# Patient Record
Sex: Female | Born: 1965 | Race: White | Hispanic: No | Smoking: Current every day smoker
Health system: Southern US, Community
[De-identification: ages and names within clinical notes are randomized; demographics above are authoritative.]

## PROBLEM LIST (undated history)

## (undated) DIAGNOSIS — R569 Unspecified convulsions: Secondary | ICD-10-CM

## (undated) DIAGNOSIS — R519 Headache, unspecified: Secondary | ICD-10-CM

## (undated) DIAGNOSIS — Z716 Tobacco abuse counseling: Secondary | ICD-10-CM

## (undated) DIAGNOSIS — R1319 Other dysphagia: Secondary | ICD-10-CM

## (undated) DIAGNOSIS — F4310 Post-traumatic stress disorder, unspecified: Secondary | ICD-10-CM

## (undated) DIAGNOSIS — M542 Cervicalgia: Secondary | ICD-10-CM

## (undated) DIAGNOSIS — F419 Anxiety disorder, unspecified: Secondary | ICD-10-CM

## (undated) DIAGNOSIS — M25511 Pain in right shoulder: Secondary | ICD-10-CM

## (undated) DIAGNOSIS — G44309 Post-traumatic headache, unspecified, not intractable: Secondary | ICD-10-CM

## (undated) DIAGNOSIS — F39 Unspecified mood [affective] disorder: Secondary | ICD-10-CM

## (undated) DIAGNOSIS — R29898 Other symptoms and signs involving the musculoskeletal system: Secondary | ICD-10-CM

## (undated) DIAGNOSIS — K219 Gastro-esophageal reflux disease without esophagitis: Secondary | ICD-10-CM

## (undated) DIAGNOSIS — G9389 Other specified disorders of brain: Secondary | ICD-10-CM

## (undated) DIAGNOSIS — M259 Joint disorder, unspecified: Secondary | ICD-10-CM

## (undated) DIAGNOSIS — G8929 Other chronic pain: Secondary | ICD-10-CM

## (undated) DIAGNOSIS — R42 Dizziness and giddiness: Secondary | ICD-10-CM

## (undated) DIAGNOSIS — N6320 Unspecified lump in the left breast, unspecified quadrant: Secondary | ICD-10-CM

## (undated) DIAGNOSIS — K5909 Other constipation: Secondary | ICD-10-CM

## (undated) DIAGNOSIS — G479 Sleep disorder, unspecified: Secondary | ICD-10-CM

## (undated) DIAGNOSIS — C801 Malignant (primary) neoplasm, unspecified: Secondary | ICD-10-CM

## (undated) DIAGNOSIS — J449 Chronic obstructive pulmonary disease, unspecified: Secondary | ICD-10-CM

## (undated) DIAGNOSIS — R109 Unspecified abdominal pain: Secondary | ICD-10-CM

## (undated) DIAGNOSIS — S069XAA Unspecified intracranial injury with loss of consciousness status unknown, initial encounter: Secondary | ICD-10-CM

## (undated) DIAGNOSIS — R748 Abnormal levels of other serum enzymes: Secondary | ICD-10-CM

## (undated) DIAGNOSIS — F329 Major depressive disorder, single episode, unspecified: Secondary | ICD-10-CM

## (undated) DIAGNOSIS — Z83719 Family history of colon polyps, unspecified: Secondary | ICD-10-CM

## (undated) DIAGNOSIS — K625 Hemorrhage of anus and rectum: Secondary | ICD-10-CM

## (undated) DIAGNOSIS — W19XXXA Unspecified fall, initial encounter: Secondary | ICD-10-CM

## (undated) DIAGNOSIS — I10 Essential (primary) hypertension: Secondary | ICD-10-CM

## (undated) DIAGNOSIS — S069X9A Unspecified intracranial injury with loss of consciousness of unspecified duration, initial encounter: Secondary | ICD-10-CM

## (undated) DIAGNOSIS — F32A Depression, unspecified: Secondary | ICD-10-CM

## (undated) DIAGNOSIS — R296 Repeated falls: Secondary | ICD-10-CM

## (undated) HISTORY — DX: Abnormal levels of other serum enzymes: R74.8

## (undated) HISTORY — PX: BACK SURGERY: SHX140

## (undated) HISTORY — DX: Other specified disorders of brain: G93.89

## (undated) HISTORY — DX: Pain in right shoulder: M25.511

## (undated) HISTORY — DX: Family history of colon polyps, unspecified: Z83.719

## (undated) HISTORY — DX: Tobacco abuse counseling: Z71.6

## (undated) HISTORY — DX: Essential (primary) hypertension: I10

## (undated) HISTORY — DX: Other dysphagia: R13.19

## (undated) HISTORY — DX: Anxiety disorder, unspecified: F41.9

## (undated) HISTORY — DX: Cervicalgia: M54.2

## (undated) HISTORY — DX: Headache, unspecified: R51.9

## (undated) HISTORY — DX: Unspecified mood (affective) disorder: F39

## (undated) HISTORY — PX: BREAST SURGERY: SHX581

## (undated) HISTORY — PX: OTHER SURGICAL HISTORY: SHX169

## (undated) HISTORY — DX: Chronic obstructive pulmonary disease, unspecified: J44.9

## (undated) HISTORY — DX: Other symptoms and signs involving the musculoskeletal system: R29.898

## (undated) HISTORY — PX: EYE SURGERY: SHX253

## (undated) HISTORY — DX: Unspecified abdominal pain: R10.9

## (undated) HISTORY — DX: Other chronic pain: G89.29

## (undated) HISTORY — PX: APPENDECTOMY: SHX54

## (undated) HISTORY — PX: COLON SURGERY: SHX602

## (undated) HISTORY — DX: Post-traumatic headache, unspecified, not intractable: G44.309

## (undated) HISTORY — DX: Other constipation: K59.09

## (undated) HISTORY — PX: LEEP: SHX91

## (undated) HISTORY — DX: Sleep disorder, unspecified: G47.9

---

## 2007-02-02 ENCOUNTER — Emergency Department: Payer: Self-pay | Admitting: Internal Medicine

## 2010-06-28 ENCOUNTER — Emergency Department: Payer: Self-pay | Admitting: Emergency Medicine

## 2011-04-09 DIAGNOSIS — G890 Central pain syndrome: Secondary | ICD-10-CM | POA: Diagnosis not present

## 2011-04-09 DIAGNOSIS — R109 Unspecified abdominal pain: Secondary | ICD-10-CM | POA: Diagnosis not present

## 2011-05-06 DIAGNOSIS — S0990XA Unspecified injury of head, initial encounter: Secondary | ICD-10-CM

## 2011-05-06 DIAGNOSIS — F329 Major depressive disorder, single episode, unspecified: Secondary | ICD-10-CM | POA: Diagnosis not present

## 2011-05-06 DIAGNOSIS — R5381 Other malaise: Secondary | ICD-10-CM | POA: Diagnosis not present

## 2011-05-06 DIAGNOSIS — E039 Hypothyroidism, unspecified: Secondary | ICD-10-CM | POA: Diagnosis not present

## 2011-05-06 HISTORY — DX: Unspecified injury of head, initial encounter: S09.90XA

## 2011-06-28 DIAGNOSIS — M549 Dorsalgia, unspecified: Secondary | ICD-10-CM | POA: Diagnosis not present

## 2011-06-28 DIAGNOSIS — Z79899 Other long term (current) drug therapy: Secondary | ICD-10-CM | POA: Diagnosis not present

## 2011-06-28 DIAGNOSIS — Z9889 Other specified postprocedural states: Secondary | ICD-10-CM | POA: Diagnosis not present

## 2011-06-28 DIAGNOSIS — G890 Central pain syndrome: Secondary | ICD-10-CM | POA: Diagnosis not present

## 2011-08-30 DIAGNOSIS — S8990XA Unspecified injury of unspecified lower leg, initial encounter: Secondary | ICD-10-CM | POA: Diagnosis not present

## 2011-08-30 DIAGNOSIS — S99929A Unspecified injury of unspecified foot, initial encounter: Secondary | ICD-10-CM | POA: Diagnosis not present

## 2011-08-30 DIAGNOSIS — F329 Major depressive disorder, single episode, unspecified: Secondary | ICD-10-CM | POA: Diagnosis not present

## 2011-08-30 DIAGNOSIS — S0990XA Unspecified injury of head, initial encounter: Secondary | ICD-10-CM | POA: Diagnosis not present

## 2011-09-27 DIAGNOSIS — S179XXA Crushing injury of neck, part unspecified, initial encounter: Secondary | ICD-10-CM | POA: Insufficient documentation

## 2011-09-27 DIAGNOSIS — S0990XA Unspecified injury of head, initial encounter: Secondary | ICD-10-CM | POA: Diagnosis not present

## 2011-09-27 HISTORY — DX: Crushing injury of neck, part unspecified, initial encounter: S17.9XXA

## 2011-11-22 DIAGNOSIS — IMO0002 Reserved for concepts with insufficient information to code with codable children: Secondary | ICD-10-CM | POA: Diagnosis not present

## 2011-12-09 ENCOUNTER — Encounter: Payer: Self-pay | Admitting: Family Medicine

## 2011-12-22 DIAGNOSIS — H43819 Vitreous degeneration, unspecified eye: Secondary | ICD-10-CM | POA: Diagnosis not present

## 2012-05-09 DIAGNOSIS — IMO0002 Reserved for concepts with insufficient information to code with codable children: Secondary | ICD-10-CM | POA: Diagnosis not present

## 2012-05-09 DIAGNOSIS — S336XXA Sprain of sacroiliac joint, initial encounter: Secondary | ICD-10-CM | POA: Diagnosis not present

## 2012-06-10 ENCOUNTER — Emergency Department: Payer: Self-pay | Admitting: Emergency Medicine

## 2012-06-10 DIAGNOSIS — Z79899 Other long term (current) drug therapy: Secondary | ICD-10-CM | POA: Diagnosis not present

## 2012-06-10 DIAGNOSIS — S93409A Sprain of unspecified ligament of unspecified ankle, initial encounter: Secondary | ICD-10-CM | POA: Diagnosis not present

## 2012-06-10 DIAGNOSIS — M25579 Pain in unspecified ankle and joints of unspecified foot: Secondary | ICD-10-CM | POA: Diagnosis not present

## 2012-06-10 DIAGNOSIS — F411 Generalized anxiety disorder: Secondary | ICD-10-CM | POA: Diagnosis not present

## 2012-06-10 DIAGNOSIS — F17200 Nicotine dependence, unspecified, uncomplicated: Secondary | ICD-10-CM | POA: Diagnosis not present

## 2012-06-10 DIAGNOSIS — S0990XA Unspecified injury of head, initial encounter: Secondary | ICD-10-CM | POA: Diagnosis not present

## 2012-07-04 DIAGNOSIS — G8929 Other chronic pain: Secondary | ICD-10-CM | POA: Diagnosis not present

## 2012-07-04 DIAGNOSIS — M549 Dorsalgia, unspecified: Secondary | ICD-10-CM | POA: Diagnosis not present

## 2012-07-04 DIAGNOSIS — S93409A Sprain of unspecified ligament of unspecified ankle, initial encounter: Secondary | ICD-10-CM | POA: Diagnosis not present

## 2012-08-03 DIAGNOSIS — Z79899 Other long term (current) drug therapy: Secondary | ICD-10-CM | POA: Diagnosis not present

## 2012-09-04 DIAGNOSIS — M5380 Other specified dorsopathies, site unspecified: Secondary | ICD-10-CM | POA: Diagnosis not present

## 2012-09-04 DIAGNOSIS — M549 Dorsalgia, unspecified: Secondary | ICD-10-CM | POA: Diagnosis not present

## 2012-09-04 DIAGNOSIS — G8929 Other chronic pain: Secondary | ICD-10-CM | POA: Diagnosis not present

## 2012-12-28 DIAGNOSIS — G8929 Other chronic pain: Secondary | ICD-10-CM | POA: Diagnosis not present

## 2012-12-28 DIAGNOSIS — Z23 Encounter for immunization: Secondary | ICD-10-CM | POA: Diagnosis not present

## 2012-12-28 DIAGNOSIS — M549 Dorsalgia, unspecified: Secondary | ICD-10-CM | POA: Diagnosis not present

## 2012-12-28 DIAGNOSIS — M5380 Other specified dorsopathies, site unspecified: Secondary | ICD-10-CM | POA: Diagnosis not present

## 2013-02-06 DIAGNOSIS — F411 Generalized anxiety disorder: Secondary | ICD-10-CM | POA: Diagnosis not present

## 2013-02-06 DIAGNOSIS — M549 Dorsalgia, unspecified: Secondary | ICD-10-CM | POA: Diagnosis not present

## 2013-02-06 DIAGNOSIS — M5380 Other specified dorsopathies, site unspecified: Secondary | ICD-10-CM | POA: Diagnosis not present

## 2013-02-06 DIAGNOSIS — G8929 Other chronic pain: Secondary | ICD-10-CM | POA: Diagnosis not present

## 2013-03-09 DIAGNOSIS — G8929 Other chronic pain: Secondary | ICD-10-CM | POA: Diagnosis not present

## 2013-03-09 DIAGNOSIS — M5380 Other specified dorsopathies, site unspecified: Secondary | ICD-10-CM | POA: Diagnosis not present

## 2013-03-09 DIAGNOSIS — M549 Dorsalgia, unspecified: Secondary | ICD-10-CM | POA: Diagnosis not present

## 2013-03-27 ENCOUNTER — Emergency Department: Payer: Self-pay | Admitting: Emergency Medicine

## 2013-03-27 DIAGNOSIS — F17200 Nicotine dependence, unspecified, uncomplicated: Secondary | ICD-10-CM | POA: Diagnosis not present

## 2013-03-27 DIAGNOSIS — F411 Generalized anxiety disorder: Secondary | ICD-10-CM | POA: Diagnosis not present

## 2013-03-27 DIAGNOSIS — G40909 Epilepsy, unspecified, not intractable, without status epilepticus: Secondary | ICD-10-CM | POA: Diagnosis not present

## 2013-03-27 DIAGNOSIS — R569 Unspecified convulsions: Secondary | ICD-10-CM | POA: Diagnosis not present

## 2013-03-27 LAB — URINALYSIS, COMPLETE
BACTERIA: NONE SEEN
Bilirubin,UR: NEGATIVE
Blood: NEGATIVE
Glucose,UR: NEGATIVE mg/dL (ref 0–75)
KETONE: NEGATIVE
LEUKOCYTE ESTERASE: NEGATIVE
Nitrite: NEGATIVE
Ph: 6 (ref 4.5–8.0)
Protein: 30
RBC,UR: 2 /HPF (ref 0–5)
Specific Gravity: 1.025 (ref 1.003–1.030)
WBC UR: 5 /HPF (ref 0–5)

## 2013-03-27 LAB — COMPREHENSIVE METABOLIC PANEL
ALK PHOS: 100 U/L
AST: 23 U/L (ref 15–37)
Albumin: 4.3 g/dL (ref 3.4–5.0)
Anion Gap: 8 (ref 7–16)
BILIRUBIN TOTAL: 0.4 mg/dL (ref 0.2–1.0)
BUN: 19 mg/dL — AB (ref 7–18)
CALCIUM: 9.7 mg/dL (ref 8.5–10.1)
CHLORIDE: 106 mmol/L (ref 98–107)
Co2: 26 mmol/L (ref 21–32)
Creatinine: 0.78 mg/dL (ref 0.60–1.30)
EGFR (African American): >60
GLUCOSE: 118 mg/dL — AB (ref 65–99)
OSMOLALITY: 283 (ref 275–301)
Potassium: 3.6 mmol/L (ref 3.5–5.1)
SGPT (ALT): 23 U/L (ref 12–78)
Sodium: 140 mmol/L (ref 136–145)
TOTAL PROTEIN: 7.9 g/dL (ref 6.4–8.2)

## 2013-03-27 LAB — DRUG SCREEN, URINE
Amphetamines, Ur Screen: NEGATIVE (ref ?–1000)
BENZODIAZEPINE, UR SCRN: POSITIVE (ref ?–200)
Barbiturates, Ur Screen: NEGATIVE (ref ?–200)
Cannabinoid 50 Ng, Ur ~~LOC~~: POSITIVE (ref ?–50)
Cocaine Metabolite,Ur ~~LOC~~: NEGATIVE (ref ?–300)
MDMA (Ecstasy)Ur Screen: NEGATIVE (ref ?–500)
Methadone, Ur Screen: NEGATIVE (ref ?–300)
Opiate, Ur Screen: POSITIVE (ref ?–300)
Phencyclidine (PCP) Ur S: NEGATIVE (ref ?–25)
Tricyclic, Ur Screen: NEGATIVE (ref ?–1000)

## 2013-03-27 LAB — CBC
HCT: 43.2 % (ref 35.0–47.0)
HGB: 14.8 g/dL (ref 12.0–16.0)
MCH: 31.9 pg (ref 26.0–34.0)
MCHC: 34.2 g/dL (ref 32.0–36.0)
MCV: 93 fL (ref 80–100)
Platelet: 279 10*3/uL (ref 150–440)
RBC: 4.63 10*6/uL (ref 3.80–5.20)
RDW: 13.4 % (ref 11.5–14.5)
WBC: 14 10*3/uL — AB (ref 3.6–11.0)

## 2013-04-03 DIAGNOSIS — Z5189 Encounter for other specified aftercare: Secondary | ICD-10-CM | POA: Diagnosis not present

## 2013-04-03 DIAGNOSIS — G40909 Epilepsy, unspecified, not intractable, without status epilepticus: Secondary | ICD-10-CM | POA: Diagnosis not present

## 2013-05-09 DIAGNOSIS — R569 Unspecified convulsions: Secondary | ICD-10-CM | POA: Diagnosis not present

## 2013-06-07 DIAGNOSIS — R569 Unspecified convulsions: Secondary | ICD-10-CM | POA: Diagnosis not present

## 2013-07-05 DIAGNOSIS — E782 Mixed hyperlipidemia: Secondary | ICD-10-CM | POA: Diagnosis not present

## 2013-07-05 DIAGNOSIS — Z79899 Other long term (current) drug therapy: Secondary | ICD-10-CM | POA: Diagnosis not present

## 2013-07-05 DIAGNOSIS — R51 Headache: Secondary | ICD-10-CM | POA: Diagnosis not present

## 2013-07-06 DIAGNOSIS — R569 Unspecified convulsions: Secondary | ICD-10-CM | POA: Diagnosis not present

## 2013-07-07 DIAGNOSIS — R569 Unspecified convulsions: Secondary | ICD-10-CM | POA: Diagnosis not present

## 2013-07-08 DIAGNOSIS — R569 Unspecified convulsions: Secondary | ICD-10-CM | POA: Diagnosis not present

## 2013-08-21 DIAGNOSIS — M25476 Effusion, unspecified foot: Secondary | ICD-10-CM | POA: Diagnosis not present

## 2013-08-21 DIAGNOSIS — M25473 Effusion, unspecified ankle: Secondary | ICD-10-CM | POA: Diagnosis not present

## 2013-08-21 DIAGNOSIS — T63001A Toxic effect of unspecified snake venom, accidental (unintentional), initial encounter: Secondary | ICD-10-CM | POA: Diagnosis not present

## 2013-08-21 DIAGNOSIS — M25579 Pain in unspecified ankle and joints of unspecified foot: Secondary | ICD-10-CM | POA: Diagnosis not present

## 2013-08-22 ENCOUNTER — Observation Stay: Payer: Self-pay | Admitting: Student

## 2013-08-22 DIAGNOSIS — G40909 Epilepsy, unspecified, not intractable, without status epilepticus: Secondary | ICD-10-CM | POA: Diagnosis not present

## 2013-08-22 DIAGNOSIS — F411 Generalized anxiety disorder: Secondary | ICD-10-CM | POA: Diagnosis not present

## 2013-08-22 DIAGNOSIS — R29898 Other symptoms and signs involving the musculoskeletal system: Secondary | ICD-10-CM | POA: Diagnosis not present

## 2013-08-22 DIAGNOSIS — Z87820 Personal history of traumatic brain injury: Secondary | ICD-10-CM | POA: Diagnosis not present

## 2013-08-22 DIAGNOSIS — S069X9A Unspecified intracranial injury with loss of consciousness of unspecified duration, initial encounter: Secondary | ICD-10-CM | POA: Diagnosis not present

## 2013-08-22 DIAGNOSIS — Z9109 Other allergy status, other than to drugs and biological substances: Secondary | ICD-10-CM | POA: Diagnosis not present

## 2013-08-22 DIAGNOSIS — Z79899 Other long term (current) drug therapy: Secondary | ICD-10-CM | POA: Diagnosis not present

## 2013-08-22 DIAGNOSIS — Z91041 Radiographic dye allergy status: Secondary | ICD-10-CM | POA: Diagnosis not present

## 2013-08-22 DIAGNOSIS — Z833 Family history of diabetes mellitus: Secondary | ICD-10-CM | POA: Diagnosis not present

## 2013-08-22 DIAGNOSIS — S069XAA Unspecified intracranial injury with loss of consciousness status unknown, initial encounter: Secondary | ICD-10-CM | POA: Diagnosis not present

## 2013-08-22 DIAGNOSIS — Z8249 Family history of ischemic heart disease and other diseases of the circulatory system: Secondary | ICD-10-CM | POA: Diagnosis not present

## 2013-08-22 DIAGNOSIS — T6391XA Toxic effect of contact with unspecified venomous animal, accidental (unintentional), initial encounter: Secondary | ICD-10-CM

## 2013-08-22 LAB — CREATININE, SERUM
Creatinine: 0.6 mg/dL (ref 0.60–1.30)
EGFR (Non-African Amer.): >60

## 2013-08-22 LAB — BASIC METABOLIC PANEL
ANION GAP: 7 (ref 7–16)
BUN: 15 mg/dL (ref 7–18)
CALCIUM: 8.8 mg/dL (ref 8.5–10.1)
CREATININE: 0.59 mg/dL — AB (ref 0.60–1.30)
Chloride: 103 mmol/L (ref 98–107)
Co2: 29 mmol/L (ref 21–32)
EGFR (African American): >60
EGFR (Non-African Amer.): >60
Glucose: 117 mg/dL — ABNORMAL HIGH (ref 65–99)
Osmolality: 279 (ref 275–301)
POTASSIUM: 4.1 mmol/L (ref 3.5–5.1)
Sodium: 139 mmol/L (ref 136–145)

## 2013-08-22 LAB — CBC WITH DIFFERENTIAL/PLATELET
BASOS PCT: 0.3 %
BASOS PCT: 0.7 %
Basophil #: 0 10*3/uL (ref 0.0–0.1)
Basophil #: 0.1 10*3/uL (ref 0.0–0.1)
Basophil #: 0.1 10*3/uL (ref 0.0–0.1)
Basophil %: 0.3 %
EOS ABS: 0.3 10*3/uL (ref 0.0–0.7)
EOS ABS: 0.6 10*3/uL (ref 0.0–0.7)
EOS PCT: 4.2 %
Eosinophil #: 0.1 10*3/uL (ref 0.0–0.7)
Eosinophil %: 0.4 %
Eosinophil %: 2 %
HCT: 40 % (ref 35.0–47.0)
HCT: 40.9 % (ref 35.0–47.0)
HCT: 42.2 % (ref 35.0–47.0)
HGB: 13.4 g/dL (ref 12.0–16.0)
HGB: 13.6 g/dL (ref 12.0–16.0)
HGB: 13.9 g/dL (ref 12.0–16.0)
LYMPHS ABS: 3.2 10*3/uL (ref 1.0–3.6)
Lymphocyte #: 2.6 10*3/uL (ref 1.0–3.6)
Lymphocyte #: 6.3 10*3/uL — ABNORMAL HIGH (ref 1.0–3.6)
Lymphocyte %: 15.8 %
Lymphocyte %: 21.6 %
Lymphocyte %: 42.9 %
MCH: 30.9 pg (ref 26.0–34.0)
MCH: 31.2 pg (ref 26.0–34.0)
MCH: 31.2 pg (ref 26.0–34.0)
MCHC: 33 g/dL (ref 32.0–36.0)
MCHC: 33.2 g/dL (ref 32.0–36.0)
MCHC: 33.5 g/dL (ref 32.0–36.0)
MCV: 93 fL (ref 80–100)
MCV: 94 fL (ref 80–100)
MCV: 94 fL (ref 80–100)
MONO ABS: 1 x10 3/mm — AB (ref 0.2–0.9)
MONOS PCT: 4.8 %
MONOS PCT: 6.2 %
MONOS PCT: 6.8 %
Monocyte #: 0.8 x10 3/mm (ref 0.2–0.9)
Monocyte #: 0.9 x10 3/mm (ref 0.2–0.9)
NEUTROS PCT: 78.7 %
Neutrophil #: 10.5 10*3/uL — ABNORMAL HIGH (ref 1.4–6.5)
Neutrophil #: 13.2 10*3/uL — ABNORMAL HIGH (ref 1.4–6.5)
Neutrophil #: 6.7 10*3/uL — ABNORMAL HIGH (ref 1.4–6.5)
Neutrophil %: 45.4 %
Neutrophil %: 69.9 %
PLATELETS: 285 10*3/uL (ref 150–440)
Platelet: 277 10*3/uL (ref 150–440)
Platelet: 323 10*3/uL (ref 150–440)
RBC: 4.3 10*6/uL (ref 3.80–5.20)
RBC: 4.35 10*6/uL (ref 3.80–5.20)
RBC: 4.5 10*6/uL (ref 3.80–5.20)
RDW: 13.2 % (ref 11.5–14.5)
RDW: 13.4 % (ref 11.5–14.5)
RDW: 13.4 % (ref 11.5–14.5)
WBC: 14.8 10*3/uL — AB (ref 3.6–11.0)
WBC: 15 10*3/uL — AB (ref 3.6–11.0)
WBC: 16.7 10*3/uL — ABNORMAL HIGH (ref 3.6–11.0)

## 2013-08-22 LAB — PROTIME-INR
INR: 1
INR: 1
INR: 1.1
PROTHROMBIN TIME: 13.3 s (ref 11.5–14.7)
PROTHROMBIN TIME: 13.8 s (ref 11.5–14.7)
Prothrombin Time: 12.8 secs (ref 11.5–14.7)

## 2013-08-22 LAB — FIBRINOGEN
Fibrinogen: 322 mg/dL (ref 210–470)
Fibrinogen: 341 mg/dL (ref 210–470)

## 2013-08-22 LAB — D-DIMER(ARMC): D-Dimer: 331 ng/ml

## 2013-08-23 DIAGNOSIS — T6391XA Toxic effect of contact with unspecified venomous animal, accidental (unintentional), initial encounter: Secondary | ICD-10-CM | POA: Diagnosis not present

## 2013-08-23 DIAGNOSIS — F411 Generalized anxiety disorder: Secondary | ICD-10-CM | POA: Diagnosis not present

## 2013-08-23 DIAGNOSIS — S069XAA Unspecified intracranial injury with loss of consciousness status unknown, initial encounter: Secondary | ICD-10-CM | POA: Diagnosis not present

## 2013-08-23 DIAGNOSIS — G40909 Epilepsy, unspecified, not intractable, without status epilepticus: Secondary | ICD-10-CM | POA: Diagnosis not present

## 2013-08-23 DIAGNOSIS — S069X9A Unspecified intracranial injury with loss of consciousness of unspecified duration, initial encounter: Secondary | ICD-10-CM | POA: Diagnosis not present

## 2013-08-23 LAB — PROTIME-INR
INR: 1.1
PROTHROMBIN TIME: 13.9 s (ref 11.5–14.7)

## 2013-08-26 ENCOUNTER — Emergency Department: Payer: Self-pay | Admitting: Emergency Medicine

## 2013-08-26 DIAGNOSIS — M25473 Effusion, unspecified ankle: Secondary | ICD-10-CM | POA: Diagnosis not present

## 2013-08-26 DIAGNOSIS — M79609 Pain in unspecified limb: Secondary | ICD-10-CM | POA: Diagnosis not present

## 2013-08-26 DIAGNOSIS — F17200 Nicotine dependence, unspecified, uncomplicated: Secondary | ICD-10-CM | POA: Diagnosis not present

## 2013-08-26 DIAGNOSIS — M25476 Effusion, unspecified foot: Secondary | ICD-10-CM | POA: Diagnosis not present

## 2013-08-26 DIAGNOSIS — T6391XA Toxic effect of contact with unspecified venomous animal, accidental (unintentional), initial encounter: Secondary | ICD-10-CM | POA: Diagnosis not present

## 2013-08-26 DIAGNOSIS — Z79899 Other long term (current) drug therapy: Secondary | ICD-10-CM | POA: Diagnosis not present

## 2013-08-26 DIAGNOSIS — Z760 Encounter for issue of repeat prescription: Secondary | ICD-10-CM | POA: Diagnosis not present

## 2013-08-26 DIAGNOSIS — R569 Unspecified convulsions: Secondary | ICD-10-CM | POA: Diagnosis not present

## 2013-08-26 LAB — PROTIME-INR
INR: 1
Prothrombin Time: 13.2 secs (ref 11.5–14.7)

## 2013-08-26 LAB — CBC WITH DIFFERENTIAL/PLATELET
Basophil #: 0.1 10*3/uL (ref 0.0–0.1)
Basophil %: 0.6 %
EOS PCT: 3.4 %
Eosinophil #: 0.4 10*3/uL (ref 0.0–0.7)
HCT: 41.6 % (ref 35.0–47.0)
HGB: 13.9 g/dL (ref 12.0–16.0)
LYMPHS PCT: 33.1 %
Lymphocyte #: 4.1 10*3/uL — ABNORMAL HIGH (ref 1.0–3.6)
MCH: 31.2 pg (ref 26.0–34.0)
MCHC: 33.5 g/dL (ref 32.0–36.0)
MCV: 93 fL (ref 80–100)
Monocyte #: 0.7 x10 3/mm (ref 0.2–0.9)
Monocyte %: 5.6 %
NEUTROS ABS: 7.1 10*3/uL — AB (ref 1.4–6.5)
NEUTROS PCT: 57.3 %
Platelet: 320 10*3/uL (ref 150–440)
RBC: 4.47 10*6/uL (ref 3.80–5.20)
RDW: 13.3 % (ref 11.5–14.5)
WBC: 12.4 10*3/uL — ABNORMAL HIGH (ref 3.6–11.0)

## 2013-08-26 LAB — COMPREHENSIVE METABOLIC PANEL
ALBUMIN: 4.3 g/dL (ref 3.4–5.0)
ALK PHOS: 97 U/L
AST: 17 U/L (ref 15–37)
Anion Gap: 6 — ABNORMAL LOW (ref 7–16)
BUN: 15 mg/dL (ref 7–18)
Bilirubin,Total: 0.3 mg/dL (ref 0.2–1.0)
CO2: 31 mmol/L (ref 21–32)
CREATININE: 0.6 mg/dL (ref 0.60–1.30)
Calcium, Total: 9.8 mg/dL (ref 8.5–10.1)
Chloride: 102 mmol/L (ref 98–107)
EGFR (Non-African Amer.): >60
Glucose: 92 mg/dL (ref 65–99)
OSMOLALITY: 278 (ref 275–301)
Potassium: 4.1 mmol/L (ref 3.5–5.1)
SGPT (ALT): 21 U/L (ref 12–78)
Sodium: 139 mmol/L (ref 136–145)
Total Protein: 7.6 g/dL (ref 6.4–8.2)

## 2013-08-26 LAB — FIBRINOGEN: FIBRINOGEN: 320 mg/dL (ref 210–470)

## 2013-08-26 LAB — D-DIMER(ARMC): D-DIMER: 923 ng/mL

## 2013-09-14 DIAGNOSIS — D649 Anemia, unspecified: Secondary | ICD-10-CM | POA: Diagnosis not present

## 2013-09-21 DIAGNOSIS — G894 Chronic pain syndrome: Secondary | ICD-10-CM | POA: Diagnosis not present

## 2013-09-21 DIAGNOSIS — E782 Mixed hyperlipidemia: Secondary | ICD-10-CM | POA: Diagnosis not present

## 2013-09-21 DIAGNOSIS — S069X9S Unspecified intracranial injury with loss of consciousness of unspecified duration, sequela: Secondary | ICD-10-CM | POA: Diagnosis not present

## 2013-09-21 DIAGNOSIS — F17200 Nicotine dependence, unspecified, uncomplicated: Secondary | ICD-10-CM | POA: Diagnosis not present

## 2013-09-21 DIAGNOSIS — G40909 Epilepsy, unspecified, not intractable, without status epilepticus: Secondary | ICD-10-CM | POA: Diagnosis not present

## 2013-09-21 DIAGNOSIS — S069XAS Unspecified intracranial injury with loss of consciousness status unknown, sequela: Secondary | ICD-10-CM | POA: Diagnosis not present

## 2013-09-21 DIAGNOSIS — F329 Major depressive disorder, single episode, unspecified: Secondary | ICD-10-CM | POA: Diagnosis not present

## 2013-09-21 DIAGNOSIS — F3289 Other specified depressive episodes: Secondary | ICD-10-CM | POA: Diagnosis not present

## 2013-09-21 DIAGNOSIS — M5137 Other intervertebral disc degeneration, lumbosacral region: Secondary | ICD-10-CM | POA: Diagnosis not present

## 2013-09-21 DIAGNOSIS — F411 Generalized anxiety disorder: Secondary | ICD-10-CM | POA: Diagnosis not present

## 2013-10-01 DIAGNOSIS — R569 Unspecified convulsions: Secondary | ICD-10-CM | POA: Diagnosis not present

## 2013-10-18 DIAGNOSIS — Z23 Encounter for immunization: Secondary | ICD-10-CM | POA: Diagnosis not present

## 2013-10-18 DIAGNOSIS — F3289 Other specified depressive episodes: Secondary | ICD-10-CM | POA: Diagnosis not present

## 2013-10-18 DIAGNOSIS — E782 Mixed hyperlipidemia: Secondary | ICD-10-CM | POA: Diagnosis not present

## 2013-10-18 DIAGNOSIS — F329 Major depressive disorder, single episode, unspecified: Secondary | ICD-10-CM | POA: Diagnosis not present

## 2013-10-18 DIAGNOSIS — G40909 Epilepsy, unspecified, not intractable, without status epilepticus: Secondary | ICD-10-CM | POA: Diagnosis not present

## 2013-11-21 ENCOUNTER — Ambulatory Visit: Payer: Self-pay | Admitting: Physician Assistant

## 2013-11-21 DIAGNOSIS — F17200 Nicotine dependence, unspecified, uncomplicated: Secondary | ICD-10-CM | POA: Diagnosis not present

## 2013-11-21 DIAGNOSIS — E782 Mixed hyperlipidemia: Secondary | ICD-10-CM | POA: Diagnosis not present

## 2013-11-21 DIAGNOSIS — M4802 Spinal stenosis, cervical region: Secondary | ICD-10-CM | POA: Diagnosis not present

## 2013-11-21 DIAGNOSIS — R300 Dysuria: Secondary | ICD-10-CM | POA: Diagnosis not present

## 2013-11-21 DIAGNOSIS — G894 Chronic pain syndrome: Secondary | ICD-10-CM | POA: Diagnosis not present

## 2013-11-21 DIAGNOSIS — M542 Cervicalgia: Secondary | ICD-10-CM | POA: Diagnosis not present

## 2013-11-21 DIAGNOSIS — Z0000 Encounter for general adult medical examination without abnormal findings: Secondary | ICD-10-CM | POA: Diagnosis not present

## 2013-11-21 DIAGNOSIS — M25519 Pain in unspecified shoulder: Secondary | ICD-10-CM | POA: Diagnosis not present

## 2013-11-21 DIAGNOSIS — M5106 Intervertebral disc disorders with myelopathy, lumbar region: Secondary | ICD-10-CM | POA: Diagnosis not present

## 2013-11-21 DIAGNOSIS — M5030 Other cervical disc degeneration, unspecified cervical region: Secondary | ICD-10-CM | POA: Diagnosis not present

## 2013-11-21 DIAGNOSIS — G40909 Epilepsy, unspecified, not intractable, without status epilepticus: Secondary | ICD-10-CM | POA: Diagnosis not present

## 2013-11-21 DIAGNOSIS — F411 Generalized anxiety disorder: Secondary | ICD-10-CM | POA: Diagnosis not present

## 2013-11-21 DIAGNOSIS — F339 Major depressive disorder, recurrent, unspecified: Secondary | ICD-10-CM | POA: Diagnosis not present

## 2013-12-11 ENCOUNTER — Inpatient Hospital Stay: Payer: Self-pay | Admitting: Internal Medicine

## 2013-12-11 DIAGNOSIS — G40909 Epilepsy, unspecified, not intractable, without status epilepticus: Secondary | ICD-10-CM | POA: Diagnosis not present

## 2013-12-11 DIAGNOSIS — Z720 Tobacco use: Secondary | ICD-10-CM | POA: Diagnosis not present

## 2013-12-11 DIAGNOSIS — G894 Chronic pain syndrome: Secondary | ICD-10-CM | POA: Diagnosis not present

## 2013-12-11 DIAGNOSIS — R569 Unspecified convulsions: Secondary | ICD-10-CM | POA: Diagnosis not present

## 2013-12-11 LAB — DRUG SCREEN, URINE
AMPHETAMINES, UR SCREEN: NEGATIVE (ref ?–1000)
BARBITURATES, UR SCREEN: NEGATIVE (ref ?–200)
Benzodiazepine, Ur Scrn: NEGATIVE (ref ?–200)
Cannabinoid 50 Ng, Ur ~~LOC~~: POSITIVE (ref ?–50)
Cocaine Metabolite,Ur ~~LOC~~: NEGATIVE (ref ?–300)
MDMA (ECSTASY) UR SCREEN: NEGATIVE (ref ?–500)
Methadone, Ur Screen: NEGATIVE (ref ?–300)
Opiate, Ur Screen: NEGATIVE (ref ?–300)
Phencyclidine (PCP) Ur S: NEGATIVE (ref ?–25)
Tricyclic, Ur Screen: NEGATIVE (ref ?–1000)

## 2013-12-11 LAB — URINALYSIS, COMPLETE
BLOOD: NEGATIVE
Bacteria: NONE SEEN
Bilirubin,UR: NEGATIVE
GLUCOSE, UR: NEGATIVE mg/dL (ref 0–75)
KETONE: NEGATIVE
Leukocyte Esterase: NEGATIVE
Nitrite: NEGATIVE
PROTEIN: NEGATIVE
Ph: 7 (ref 4.5–8.0)
RBC,UR: 1 /HPF (ref 0–5)
Specific Gravity: 1.013 (ref 1.003–1.030)
Squamous Epithelial: NONE SEEN
WBC UR: NONE SEEN /HPF (ref 0–5)

## 2013-12-11 LAB — CBC
HCT: 42.5 % (ref 35.0–47.0)
HGB: 13.8 g/dL (ref 12.0–16.0)
MCH: 30.2 pg (ref 26.0–34.0)
MCHC: 32.4 g/dL (ref 32.0–36.0)
MCV: 93 fL (ref 80–100)
Platelet: 280 10*3/uL (ref 150–440)
RBC: 4.57 10*6/uL (ref 3.80–5.20)
RDW: 12.7 % (ref 11.5–14.5)
WBC: 10.9 10*3/uL (ref 3.6–11.0)

## 2013-12-11 LAB — COMPREHENSIVE METABOLIC PANEL
Albumin: 3.8 g/dL (ref 3.4–5.0)
Alkaline Phosphatase: 97 U/L
Anion Gap: 7 (ref 7–16)
BUN: 13 mg/dL (ref 7–18)
Bilirubin,Total: 0.3 mg/dL (ref 0.2–1.0)
CHLORIDE: 106 mmol/L (ref 98–107)
CREATININE: 0.71 mg/dL (ref 0.60–1.30)
Calcium, Total: 8.8 mg/dL (ref 8.5–10.1)
Co2: 30 mmol/L (ref 21–32)
EGFR (African American): >60
GLUCOSE: 110 mg/dL — AB (ref 65–99)
OSMOLALITY: 286 (ref 275–301)
POTASSIUM: 4 mmol/L (ref 3.5–5.1)
SGOT(AST): 24 U/L (ref 15–37)
SGPT (ALT): 25 U/L
Sodium: 143 mmol/L (ref 136–145)
TOTAL PROTEIN: 7.1 g/dL (ref 6.4–8.2)

## 2013-12-11 LAB — ETHANOL: Ethanol: <3 mg/dL

## 2013-12-12 ENCOUNTER — Ambulatory Visit: Payer: Self-pay | Admitting: Neurology

## 2013-12-12 DIAGNOSIS — Z720 Tobacco use: Secondary | ICD-10-CM | POA: Diagnosis not present

## 2013-12-12 DIAGNOSIS — R569 Unspecified convulsions: Secondary | ICD-10-CM | POA: Diagnosis not present

## 2013-12-12 DIAGNOSIS — F419 Anxiety disorder, unspecified: Secondary | ICD-10-CM | POA: Diagnosis not present

## 2013-12-12 DIAGNOSIS — G894 Chronic pain syndrome: Secondary | ICD-10-CM | POA: Diagnosis not present

## 2013-12-12 LAB — PHENYTOIN LEVEL, TOTAL: Dilantin: 16.3 ug/mL (ref 10.0–20.0)

## 2013-12-13 DIAGNOSIS — H43813 Vitreous degeneration, bilateral: Secondary | ICD-10-CM | POA: Diagnosis not present

## 2013-12-13 DIAGNOSIS — R569 Unspecified convulsions: Secondary | ICD-10-CM | POA: Diagnosis not present

## 2013-12-17 ENCOUNTER — Inpatient Hospital Stay: Payer: Self-pay | Admitting: Psychiatry

## 2013-12-17 DIAGNOSIS — R45851 Suicidal ideations: Secondary | ICD-10-CM | POA: Diagnosis not present

## 2013-12-17 DIAGNOSIS — F332 Major depressive disorder, recurrent severe without psychotic features: Secondary | ICD-10-CM | POA: Diagnosis not present

## 2013-12-17 LAB — CBC
HCT: 42.6 % (ref 35.0–47.0)
HGB: 14.1 g/dL (ref 12.0–16.0)
MCH: 30.7 pg (ref 26.0–34.0)
MCHC: 33.2 g/dL (ref 32.0–36.0)
MCV: 93 fL (ref 80–100)
Platelet: 283 10*3/uL (ref 150–440)
RBC: 4.6 10*6/uL (ref 3.80–5.20)
RDW: 12.8 % (ref 11.5–14.5)
WBC: 11.2 10*3/uL — ABNORMAL HIGH (ref 3.6–11.0)

## 2013-12-17 LAB — COMPREHENSIVE METABOLIC PANEL
ALBUMIN: 4.1 g/dL (ref 3.4–5.0)
ALK PHOS: 99 U/L
Anion Gap: 7 (ref 7–16)
BUN: 19 mg/dL — AB (ref 7–18)
Bilirubin,Total: 0.2 mg/dL (ref 0.2–1.0)
CALCIUM: 9.3 mg/dL (ref 8.5–10.1)
CHLORIDE: 100 mmol/L (ref 98–107)
Co2: 31 mmol/L (ref 21–32)
Creatinine: 0.84 mg/dL (ref 0.60–1.30)
EGFR (African American): >60
EGFR (Non-African Amer.): >60
Glucose: 104 mg/dL — ABNORMAL HIGH (ref 65–99)
OSMOLALITY: 278 (ref 275–301)
Potassium: 4.3 mmol/L (ref 3.5–5.1)
SGOT(AST): 17 U/L (ref 15–37)
SGPT (ALT): 25 U/L
Sodium: 138 mmol/L (ref 136–145)
Total Protein: 7.4 g/dL (ref 6.4–8.2)

## 2013-12-17 LAB — URINALYSIS, COMPLETE
Bacteria: NONE SEEN
Bilirubin,UR: NEGATIVE
Blood: NEGATIVE
Glucose,UR: NEGATIVE mg/dL (ref 0–75)
NITRITE: NEGATIVE
PH: 5 (ref 4.5–8.0)
Protein: NEGATIVE
RBC,UR: 4 /HPF (ref 0–5)
SPECIFIC GRAVITY: 1.026 (ref 1.003–1.030)

## 2013-12-17 LAB — DRUG SCREEN, URINE
Amphetamines, Ur Screen: NEGATIVE (ref ?–1000)
BENZODIAZEPINE, UR SCRN: NEGATIVE (ref ?–200)
Barbiturates, Ur Screen: NEGATIVE (ref ?–200)
CANNABINOID 50 NG, UR ~~LOC~~: POSITIVE (ref ?–50)
Cocaine Metabolite,Ur ~~LOC~~: NEGATIVE (ref ?–300)
MDMA (ECSTASY) UR SCREEN: NEGATIVE (ref ?–500)
Methadone, Ur Screen: NEGATIVE (ref ?–300)
Opiate, Ur Screen: NEGATIVE (ref ?–300)
PHENCYCLIDINE (PCP) UR S: NEGATIVE (ref ?–25)
TRICYCLIC, UR SCREEN: NEGATIVE (ref ?–1000)

## 2013-12-17 LAB — ACETAMINOPHEN LEVEL: Acetaminophen: <2 ug/mL

## 2013-12-17 LAB — SALICYLATE LEVEL: Salicylates, Serum: 4 mg/dL — ABNORMAL HIGH

## 2013-12-17 LAB — ETHANOL: Ethanol: <3 mg/dL (ref 0–80)

## 2013-12-18 LAB — URINE CULTURE

## 2013-12-24 DIAGNOSIS — G40919 Epilepsy, unspecified, intractable, without status epilepticus: Secondary | ICD-10-CM | POA: Diagnosis not present

## 2013-12-24 DIAGNOSIS — G894 Chronic pain syndrome: Secondary | ICD-10-CM | POA: Diagnosis not present

## 2013-12-24 DIAGNOSIS — M5013 Cervical disc disorder with radiculopathy, cervicothoracic region: Secondary | ICD-10-CM | POA: Diagnosis not present

## 2013-12-24 DIAGNOSIS — F17210 Nicotine dependence, cigarettes, uncomplicated: Secondary | ICD-10-CM | POA: Diagnosis not present

## 2013-12-24 DIAGNOSIS — F410 Panic disorder [episodic paroxysmal anxiety] without agoraphobia: Secondary | ICD-10-CM | POA: Diagnosis not present

## 2013-12-24 DIAGNOSIS — F339 Major depressive disorder, recurrent, unspecified: Secondary | ICD-10-CM | POA: Diagnosis not present

## 2013-12-24 DIAGNOSIS — M5106 Intervertebral disc disorders with myelopathy, lumbar region: Secondary | ICD-10-CM | POA: Diagnosis not present

## 2013-12-24 DIAGNOSIS — N390 Urinary tract infection, site not specified: Secondary | ICD-10-CM | POA: Diagnosis not present

## 2013-12-25 DIAGNOSIS — F4310 Post-traumatic stress disorder, unspecified: Secondary | ICD-10-CM | POA: Diagnosis not present

## 2013-12-30 ENCOUNTER — Emergency Department: Payer: Self-pay | Admitting: Emergency Medicine

## 2013-12-30 DIAGNOSIS — K146 Glossodynia: Secondary | ICD-10-CM | POA: Diagnosis not present

## 2013-12-30 DIAGNOSIS — Z720 Tobacco use: Secondary | ICD-10-CM | POA: Diagnosis not present

## 2013-12-30 DIAGNOSIS — K148 Other diseases of tongue: Secondary | ICD-10-CM | POA: Diagnosis not present

## 2013-12-30 DIAGNOSIS — R221 Localized swelling, mass and lump, neck: Secondary | ICD-10-CM | POA: Diagnosis not present

## 2013-12-30 DIAGNOSIS — J029 Acute pharyngitis, unspecified: Secondary | ICD-10-CM | POA: Diagnosis not present

## 2014-01-09 ENCOUNTER — Ambulatory Visit: Payer: Self-pay | Admitting: Pain Medicine

## 2014-01-09 DIAGNOSIS — M47817 Spondylosis without myelopathy or radiculopathy, lumbosacral region: Secondary | ICD-10-CM | POA: Diagnosis not present

## 2014-01-09 DIAGNOSIS — M5416 Radiculopathy, lumbar region: Secondary | ICD-10-CM | POA: Diagnosis not present

## 2014-01-09 DIAGNOSIS — M542 Cervicalgia: Secondary | ICD-10-CM | POA: Diagnosis not present

## 2014-01-09 DIAGNOSIS — M25552 Pain in left hip: Secondary | ICD-10-CM | POA: Diagnosis not present

## 2014-01-09 DIAGNOSIS — M545 Low back pain: Secondary | ICD-10-CM | POA: Diagnosis not present

## 2014-01-09 DIAGNOSIS — M47812 Spondylosis without myelopathy or radiculopathy, cervical region: Secondary | ICD-10-CM | POA: Diagnosis not present

## 2014-01-09 DIAGNOSIS — M5412 Radiculopathy, cervical region: Secondary | ICD-10-CM | POA: Diagnosis not present

## 2014-01-15 DIAGNOSIS — G629 Polyneuropathy, unspecified: Secondary | ICD-10-CM | POA: Diagnosis not present

## 2014-01-15 DIAGNOSIS — M629 Disorder of muscle, unspecified: Secondary | ICD-10-CM | POA: Diagnosis not present

## 2014-01-15 DIAGNOSIS — M6281 Muscle weakness (generalized): Secondary | ICD-10-CM | POA: Diagnosis not present

## 2014-01-21 DIAGNOSIS — F4542 Pain disorder with related psychological factors: Secondary | ICD-10-CM | POA: Diagnosis not present

## 2014-02-11 DIAGNOSIS — F332 Major depressive disorder, recurrent severe without psychotic features: Secondary | ICD-10-CM | POA: Diagnosis not present

## 2014-02-11 DIAGNOSIS — Z79891 Long term (current) use of opiate analgesic: Secondary | ICD-10-CM | POA: Diagnosis not present

## 2014-03-18 DIAGNOSIS — M542 Cervicalgia: Secondary | ICD-10-CM | POA: Diagnosis not present

## 2014-03-18 DIAGNOSIS — Z79899 Other long term (current) drug therapy: Secondary | ICD-10-CM | POA: Diagnosis not present

## 2014-03-25 DIAGNOSIS — N390 Urinary tract infection, site not specified: Secondary | ICD-10-CM | POA: Diagnosis not present

## 2014-04-11 DIAGNOSIS — R338 Other retention of urine: Secondary | ICD-10-CM | POA: Diagnosis not present

## 2014-04-20 ENCOUNTER — Ambulatory Visit: Payer: Self-pay | Admitting: Pain Medicine

## 2014-04-20 DIAGNOSIS — M4802 Spinal stenosis, cervical region: Secondary | ICD-10-CM | POA: Diagnosis not present

## 2014-04-20 DIAGNOSIS — M4806 Spinal stenosis, lumbar region: Secondary | ICD-10-CM | POA: Diagnosis not present

## 2014-04-20 DIAGNOSIS — M5022 Other cervical disc displacement, mid-cervical region: Secondary | ICD-10-CM | POA: Diagnosis not present

## 2014-04-20 DIAGNOSIS — Z01818 Encounter for other preprocedural examination: Secondary | ICD-10-CM | POA: Diagnosis not present

## 2014-04-25 DIAGNOSIS — M9981 Other biomechanical lesions of cervical region: Secondary | ICD-10-CM | POA: Diagnosis not present

## 2014-04-25 DIAGNOSIS — M4806 Spinal stenosis, lumbar region: Secondary | ICD-10-CM | POA: Diagnosis not present

## 2014-04-25 DIAGNOSIS — M5382 Other specified dorsopathies, cervical region: Secondary | ICD-10-CM | POA: Diagnosis not present

## 2014-04-25 DIAGNOSIS — Z79891 Long term (current) use of opiate analgesic: Secondary | ICD-10-CM | POA: Diagnosis not present

## 2014-04-25 DIAGNOSIS — M5387 Other specified dorsopathies, lumbosacral region: Secondary | ICD-10-CM | POA: Diagnosis not present

## 2014-04-25 DIAGNOSIS — Z79899 Other long term (current) drug therapy: Secondary | ICD-10-CM | POA: Diagnosis not present

## 2014-04-25 DIAGNOSIS — R51 Headache: Secondary | ICD-10-CM | POA: Diagnosis not present

## 2014-04-25 DIAGNOSIS — M5020 Other cervical disc displacement, unspecified cervical region: Secondary | ICD-10-CM | POA: Diagnosis not present

## 2014-04-25 DIAGNOSIS — F329 Major depressive disorder, single episode, unspecified: Secondary | ICD-10-CM | POA: Diagnosis not present

## 2014-04-25 DIAGNOSIS — M47816 Spondylosis without myelopathy or radiculopathy, lumbar region: Secondary | ICD-10-CM | POA: Diagnosis not present

## 2014-04-25 DIAGNOSIS — F419 Anxiety disorder, unspecified: Secondary | ICD-10-CM | POA: Diagnosis not present

## 2014-04-26 DIAGNOSIS — F332 Major depressive disorder, recurrent severe without psychotic features: Secondary | ICD-10-CM | POA: Diagnosis not present
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
## 2014-06-22 NOTE — Discharge Summary (Signed)
PATIENT NAMEAMEENA, [name] MR#:  [id number] DATE OF BIRTH:  Oct 21, 1965  DATE OF ADMISSION:  08/22/2013. DATE OF DISCHARGE:  08/23/2013.  CHIEF COMPLAINT:  Snakebite.   DISCHARGE DIAGNOSES:   1.  Snakebite.  2.  Traumatic brain injury in the past.  3.  History of seizure disorder.  4.  History of anxiety.  5.  Left-sided weakness, chronic.   DISCHARGE MEDICATIONS: Acetaminophen/hydrocodone 325/5 mg 1 tablet 2 times daily  as needed for pain, fexofenadine 180 mg once daily, Keppra 500 mg 2 times daily, omeprazole 40 mg daily, Soma 350 mg 2 tablets 3 times daily.   DIET: Low sodium.   ACTIVITY: As tolerated.   FOLLOWUP: Please follow with PCP within 1 week. If worsening swelling, redness, or pain, drainage, fevers, chills, rash, or any other symptoms or complaints, call your doctor right away.   DISPOSITION: Home.   SIGNIFICANT LABORATORY DATA AND IMAGING: Initial white count of 14.8, last white count of 15, initial BUN 15, creatinine 0.6, sodium 139, potassium 4.1, PT and INR within normal x 4, D-dimer 331, fibrinogen was 341 then 322.   HISTORY OF PRESENT ILLNESS AND HOSPITAL COURSE: The patient is a 49 year old female with TBI, seizure disorder, anxiety, who came in after a snakebite. This was on both feet. Around midnight the night before she was walking in the dark in the hot weather. There is a family who thought that maybe this was a copperhead. Poison Control was called. She was started on some fluids and was noted to be not significantly coagulopathic. Coagulation studies were sent. Fibrinogen and D-dimer were sent. They remained stable. The patient has bites on both feet, medial on the right and lateral smaller on the left. There is no surrounding cellulitis or redness at this point. She will be discharged as she has not had a significant reaction.   PHYSICAL EXAMINATION:  VITAL SIGNS: On the day of discharge, temperature 97.7, pulse rate 73, respiratory rate 18, blood pressure  135/90, oxygen saturation 97%.  GENERAL: Awake, alert, conversant.  HEENT:  Normocephalic.  CARDIAC:  Normal S1, S2, regular rate and rhythm.  LUNGS: Clear.  ABDOMEN: Soft, nontender.  EXTREMITIES: No pitting edema, but they are a little tender to touch, mostly below the knee. There is no erythema or rash. There is some swelling medial right foot below the ankle without significant drainage and there is a little bit of edema, less so than the right foot, on the left lateral foot around the heel area. There is no drainage, bleeding or evidence for cellulitis.   DISPOSITION: Discharged with outpatient followup.   TOTAL TIME SPENT: About 32 minutes.   CODE STATUS: The patient is a full code.   ___________________________ Vivien Presto, MD sa:lt D: 08/23/2013 12:41:31 ET T: 08/23/2013 15:03:04 ET JOB#: [id number]  cc: Vivien Presto, MD, <Dictator> Vivien Presto MD ELECTRONICALLY SIGNED 08/28/2013 18:19

## 2014-06-22 NOTE — H&P (Signed)
PATIENT NAMECALLIE, [name] MR#:  [id number] DATE OF BIRTH:  Dec 25, 1965  DATE OF ADMISSION:  08/22/2013  PRIMARY CARE PHYSICIAN: Nonlocal.   REFERRING PHYSICIAN: Dr. Owens Shark.   CHIEF COMPLAINT: Snake bite.  HISTORY OF PRESENT ILLNESS: The patient is a 49 year old Caucasian female who came into the ED with a complaint of snakebites on both ankles. This is associated with pain and swelling. The patient is reporting that she was burning some yard stuff in the late evening, and she was moving logs, and suddenly she felt some bites on both feet. She could not find the snake, as it ran away. The patient immediately went into the home and called EMS and came into the ED. She has some amount of swelling and pain in both feet. The bite mark swelling was marked with a marker, and she was given IV morphine for pain management. As the patient has minimal swelling and not coagulopathic, per labs, she did not receive any [employer] antivenom. Hospitalist team is called to admit the patient for overnight observation. During my examination, the patient is resting comfortably. She denies any similar complaints in the past. She denies any nausea or vomiting. She denies any worsening of the swelling in the snakebite area. No family members at bedside. No other complaints.   PAST MEDICAL HISTORY: Traumatic brain injury with chronic left-sided weakness, anxiety, tremors, seizures.   PAST SURGICAL HISTORY: Lumbar fusion x2.   ALLERGIES: INTRAVENOUS PYELOGRAM DYE, ADHESIVE TAPE.   PSYCHOSOCIAL HISTORY: Lives at home with 65-year-old godson. Occasional intake of alcohol. Smokes marijuana.   FAMILY HISTORY: Dad has history of diabetes mellitus and congestive heart failure.   HOME MEDICATIONS:  soma 350 mg 2 tablets p.o. 4 times a day, Prozac 10 mg 1 tab once daily, omeprazole 20 mg once daily, Keppra 500 mg 2 times a day,  Percocet 1 tablet p.o. 2 times a day.   REVIEW OF SYSTEMS:  CONSTITUTIONAL: Denies any fever, fatigue,  weakness.  EYES: Denies blurry vision, double vision, glaucoma.  ENT: Denies epistaxis, discharge, tinnitus.  RESPIRATION: Denies cough, chronic obstructive pulmonary disease.  CARDIOVASCULAR: No chest pain, palpitations, syncope.  GASTROINTESTINAL: Denies nausea, vomiting, diarrhea, abdominal pain, hematemesis.  GENITOURINARY: No dysuria or hematuria.  ENDOCRINE: Denies polyuria, nocturia, thyroid problems.  GYNECOLOGIC AND BREASTS: Denies breast mass or vaginal discharge.  HEMATOLOGIC AND LYMPHATIC: No anemia, easy bruising, bleeding.  INTEGUMENTARY: No acne, rash, lesions other than the snake bites on both ankles.  MUSCULOSKELETAL: Complaining of bilateral foot pain. Denies any neck pain, back pain. Denies gout.  NEUROLOGIC: Denies vertigo, ataxia. Has traumatic brain injury which has caused chronic left-sided weakness, and has history of seizures.  PSYCHIATRIC: Denies any OCD or bipolar disorder. Has chronic history of anxiety.   PHYSICAL EXAMINATION: VITAL SIGNS: Temperature 97.9, pulse  76, respirations 16, blood pressure 153/83, pulse oximetry 99% on room air.  GENERAL APPEARANCE: Not in acute distress. Moderately built and nourished.  HEENT: Normocephalic, atraumatic. Pupils are equally reacting to light and accommodation. No scleral icterus. No conjunctival injection. No sinus tenderness. No postnasal drip. Moist mucous membranes.  NECK: Supple. No JVD or thyromegaly. Range of motion is intact.  LUNGS: Clear to auscultation bilaterally. No accessory muscle use and no anterior chest wall tenderness on palpation.  CARDIAC: S1, S2 normal. Regular rate and rhythm. No murmurs.  GASTROINTESTINAL: Soft. Bowel sounds are positive in all four quadrants. Nontender, nondistended. No hepatosplenomegaly. No masses felt.  NEUROLOGIC: Awake, alert, and oriented x3. Cranial nerves II through  XII are grossly intact. Motor: Chronic left-sided weakness. Sensory is intact. Reflexes are 2+.  EXTREMITIES:  In the right joint ankle, has swelling around the medial malleolus, which was marked with a marker. On the left lower extremity, the bite and swelling area is marked vessel. Tender to touch. Peripheral pulses are 2+.  PSYCHIATRIC: Flat mood and affect.   LABORATORY AND IMAGING STUDIES: WBC 14.8. The rest of the CBC is normal. PT/INR is normal.   ASSESSMENT AND PLAN: A 49 year old Caucasian female, coming to the Emergency Department after she sustained two snake bites on bilateral feet. Will be admitted with the following assessment and plan:  1.  Bilateral lower extremity snake bites. The patient did not receive any antivenom, as she has minimal swelling and not quite lethargic at this time. Will continue wound measurements every 3 hours, according to the guidelines. Repeat complete blood count, PT-INR, and fibrinogen every 6 hours. Pain management with Toradol, which helps with both pain and swelling.  2.  Gentle hydration with IV fluids.  3.  History of traumatic brain injury with left-sided weakness, which is chronic.  4.  History of seizures. Continue Keppra 500 mg p.o. b.i.d.  5.  Anxiety. Resume home medication.  6.  Provide gastrointestinal and deep vein thrombosis prophylaxis.   CODE STATUS: She is full code. Son is the medical power of attorney.   TIME SPENT: 50 minutes.   Plan of care discussed with the patient. She is aware of the plan.    ____________________________ Nicholes Mango, MD ag:cg D: 08/22/2013 02:18:13 ET T: 08/22/2013 02:51:14 ET JOB#: [id number]  cc: Nicholes Mango, MD, <Dictator> Nicholes Mango MD ELECTRONICALLY SIGNED 08/22/2013 7:35

## 2014-06-22 NOTE — Discharge Summary (Signed)
PATIENT NAMEANURADHA, CHABOT MR#:  [id number] DATE OF BIRTH:  Jan 05, 1966  DATE OF ADMISSION:  12/11/2013 DATE OF DISCHARGE:  12/12/2013  PRESENTING COMPLAINT: Seizure.  DISCHARGE DIAGNOSES:  1.  History of seizure disorder.  2.  Chronic pain.  3.  Chronic anxiety.   PRIMARY CARE PHYSICIAN: [facility].  CODE STATUS: FULL code.   DISCHARGE MEDICATIONS: 1.  Acetaminophen/hydrocodone 5/325 one tablet b.i.d. as needed.  2.  Fexofenadine 180 mg daily.  3.  Keppra 500 mg b.i.d.  4.  Omeprazole 40 mg daily.  5.  Soma 350 two tablets 3 times a day.  6.  Neurontin 100 mg 2 capsules 3 times a day.  7.  Fetzima 40 mg extended release p.o. daily.  8.  Lorazepam 0.5 mg 1 b.i.d. as needed.   DISCHARGE INSTRUCTIONS:  1.  Follow up with [facility], October 15th, your appointment. 2.  Follow up with [facility] in 1 to 2 weeks.  CONSULTATIONS: Neuro with Dr. Irish Elders. Recommends continue your home medications for seizure. No new meds were added.   HISTORY AND HOSPITAL COURSE: [name] is a 49 year old Caucasian female with history of seizures, came in with:  1.  Possible recurrent seizure. She is on Keppra. The patient mainly had more of anxiety and her pain issues. No true seizures were noted. Some flickering of eyes noted per RN with her talking during the symptoms so likely not true seizures. Seen by neurology who recommends just Keppra. EEG did not show any seizures. The has a follow-up appointment with [facility] neurology and she is recommended to continue that. 2.  Chronic pain syndrome. The patient was on Norco and soma. She will follow up with pain clinic.  3.  PTSD, chronic anxiety. She is on Fetzima. 4.  Tobacco abuse. Counseled on for smoking cessation.  5.  Chronic anxiety. The patient was started on some Ativan. She is recommended to see PCP and psych referral is needed as outpatient. No suicidal ideation was noted. Hospital stay otherwise remained stable.   TIME  SPENT: 40 minutes.   ____________________________ Hart Rochester Posey Pronto, MD sap:sb D: 12/17/2013 12:21:41 ET T: 12/17/2013 14:16:27 ET JOB#: [id number]  cc: Evian Salguero A. Posey Pronto, MD, <Dictator> [facility] Ilda Basset MD ELECTRONICALLY SIGNED 12/17/2013 15:56
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
## 2014-06-25 DIAGNOSIS — M5013 Cervical disc disorder with radiculopathy, cervicothoracic region: Secondary | ICD-10-CM | POA: Diagnosis not present

## 2014-06-25 DIAGNOSIS — F17210 Nicotine dependence, cigarettes, uncomplicated: Secondary | ICD-10-CM | POA: Diagnosis not present

## 2014-06-25 DIAGNOSIS — M5106 Intervertebral disc disorders with myelopathy, lumbar region: Secondary | ICD-10-CM | POA: Diagnosis not present

## 2014-06-25 DIAGNOSIS — G40919 Epilepsy, unspecified, intractable, without status epilepticus: Secondary | ICD-10-CM | POA: Diagnosis not present

## 2014-06-25 DIAGNOSIS — F339 Major depressive disorder, recurrent, unspecified: Secondary | ICD-10-CM | POA: Diagnosis not present

## 2014-06-25 DIAGNOSIS — G894 Chronic pain syndrome: Secondary | ICD-10-CM | POA: Diagnosis not present

## 2014-07-02 DIAGNOSIS — M961 Postlaminectomy syndrome, not elsewhere classified: Secondary | ICD-10-CM | POA: Diagnosis not present

## 2014-07-02 DIAGNOSIS — M5416 Radiculopathy, lumbar region: Secondary | ICD-10-CM | POA: Diagnosis not present

## 2014-07-02 DIAGNOSIS — M5126 Other intervertebral disc displacement, lumbar region: Secondary | ICD-10-CM | POA: Diagnosis not present

## 2014-07-02 DIAGNOSIS — M545 Low back pain: Secondary | ICD-10-CM | POA: Diagnosis not present

## 2014-07-02 DIAGNOSIS — M5410 Radiculopathy, site unspecified: Secondary | ICD-10-CM | POA: Diagnosis not present

## 2014-07-02 DIAGNOSIS — M5417 Radiculopathy, lumbosacral region: Secondary | ICD-10-CM | POA: Diagnosis not present
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
## 2014-07-16 DIAGNOSIS — M47816 Spondylosis without myelopathy or radiculopathy, lumbar region: Secondary | ICD-10-CM | POA: Diagnosis not present

## 2014-07-16 DIAGNOSIS — M5136 Other intervertebral disc degeneration, lumbar region: Secondary | ICD-10-CM | POA: Diagnosis not present

## 2014-07-16 DIAGNOSIS — G8929 Other chronic pain: Secondary | ICD-10-CM | POA: Diagnosis not present

## 2014-07-16 DIAGNOSIS — Z79899 Other long term (current) drug therapy: Secondary | ICD-10-CM | POA: Diagnosis not present

## 2014-07-24 DIAGNOSIS — M5416 Radiculopathy, lumbar region: Secondary | ICD-10-CM | POA: Diagnosis not present

## 2014-07-24 DIAGNOSIS — M5417 Radiculopathy, lumbosacral region: Secondary | ICD-10-CM | POA: Diagnosis not present

## 2014-07-24 DIAGNOSIS — M5126 Other intervertebral disc displacement, lumbar region: Secondary | ICD-10-CM | POA: Diagnosis not present

## 2014-07-24 DIAGNOSIS — M5410 Radiculopathy, site unspecified: Secondary | ICD-10-CM | POA: Diagnosis not present

## 2014-08-08 DIAGNOSIS — M545 Low back pain: Secondary | ICD-10-CM | POA: Diagnosis not present

## 2014-08-08 DIAGNOSIS — M488X6 Other specified spondylopathies, lumbar region: Secondary | ICD-10-CM | POA: Diagnosis not present

## 2014-08-08 DIAGNOSIS — M47816 Spondylosis without myelopathy or radiculopathy, lumbar region: Secondary | ICD-10-CM | POA: Diagnosis not present

## 2014-08-08 DIAGNOSIS — M4696 Unspecified inflammatory spondylopathy, lumbar region: Secondary | ICD-10-CM | POA: Diagnosis not present

## 2014-08-08 DIAGNOSIS — M5126 Other intervertebral disc displacement, lumbar region: Secondary | ICD-10-CM | POA: Diagnosis not present

## 2014-08-08 DIAGNOSIS — M5416 Radiculopathy, lumbar region: Secondary | ICD-10-CM | POA: Diagnosis not present

## 2014-08-08 DIAGNOSIS — M47817 Spondylosis without myelopathy or radiculopathy, lumbosacral region: Secondary | ICD-10-CM | POA: Diagnosis not present

## 2014-08-08 DIAGNOSIS — M5410 Radiculopathy, site unspecified: Secondary | ICD-10-CM | POA: Diagnosis not present

## 2014-09-04 DIAGNOSIS — M47817 Spondylosis without myelopathy or radiculopathy, lumbosacral region: Secondary | ICD-10-CM | POA: Diagnosis not present

## 2014-09-04 DIAGNOSIS — M5387 Other specified dorsopathies, lumbosacral region: Secondary | ICD-10-CM | POA: Diagnosis not present

## 2014-09-04 DIAGNOSIS — M5416 Radiculopathy, lumbar region: Secondary | ICD-10-CM | POA: Diagnosis not present

## 2014-09-04 DIAGNOSIS — M4696 Unspecified inflammatory spondylopathy, lumbar region: Secondary | ICD-10-CM | POA: Diagnosis not present

## 2014-09-04 DIAGNOSIS — M47816 Spondylosis without myelopathy or radiculopathy, lumbar region: Secondary | ICD-10-CM | POA: Diagnosis not present

## 2014-09-04 DIAGNOSIS — M545 Low back pain: Secondary | ICD-10-CM | POA: Diagnosis not present

## 2014-09-04 DIAGNOSIS — M5410 Radiculopathy, site unspecified: Secondary | ICD-10-CM | POA: Diagnosis not present

## 2014-09-04 DIAGNOSIS — M488X6 Other specified spondylopathies, lumbar region: Secondary | ICD-10-CM | POA: Diagnosis not present

## 2014-10-17 DIAGNOSIS — Z23 Encounter for immunization: Secondary | ICD-10-CM | POA: Diagnosis not present

## 2014-12-23 DIAGNOSIS — G40919 Epilepsy, unspecified, intractable, without status epilepticus: Secondary | ICD-10-CM | POA: Diagnosis not present

## 2014-12-23 DIAGNOSIS — H2511 Age-related nuclear cataract, right eye: Secondary | ICD-10-CM | POA: Diagnosis not present

## 2014-12-23 DIAGNOSIS — F339 Major depressive disorder, recurrent, unspecified: Secondary | ICD-10-CM | POA: Diagnosis not present

## 2014-12-23 DIAGNOSIS — F17210 Nicotine dependence, cigarettes, uncomplicated: Secondary | ICD-10-CM | POA: Diagnosis not present

## 2014-12-23 DIAGNOSIS — H539 Unspecified visual disturbance: Secondary | ICD-10-CM | POA: Diagnosis not present

## 2014-12-23 DIAGNOSIS — M5013 Cervical disc disorder with radiculopathy, cervicothoracic region: Secondary | ICD-10-CM | POA: Diagnosis not present

## 2014-12-27 DIAGNOSIS — H2511 Age-related nuclear cataract, right eye: Secondary | ICD-10-CM | POA: Diagnosis not present

## 2015-01-01 ENCOUNTER — Encounter: Payer: Self-pay | Admitting: *Deleted

## 2015-01-03 ENCOUNTER — Emergency Department
Admission: EM | Admit: 2015-01-03 | Discharge: 2015-01-03 | Disposition: A | Discharge status: Home / Self Care | Payer: Medicare Other | Attending: Emergency Medicine | Admitting: Emergency Medicine

## 2015-01-03 ENCOUNTER — Encounter: Payer: Self-pay | Admitting: *Deleted

## 2015-01-03 ENCOUNTER — Emergency Department: Payer: Medicare Other

## 2015-01-03 DIAGNOSIS — R569 Unspecified convulsions: Secondary | ICD-10-CM | POA: Diagnosis not present

## 2015-01-03 DIAGNOSIS — X58XXXA Exposure to other specified factors, initial encounter: Secondary | ICD-10-CM | POA: Diagnosis not present

## 2015-01-03 DIAGNOSIS — S9002XA Contusion of left ankle, initial encounter: Secondary | ICD-10-CM | POA: Insufficient documentation

## 2015-01-03 DIAGNOSIS — Y92007 Garden or yard of unspecified non-institutional (private) residence as the place of occurrence of the external cause: Secondary | ICD-10-CM | POA: Diagnosis not present

## 2015-01-03 DIAGNOSIS — M7989 Other specified soft tissue disorders: Secondary | ICD-10-CM | POA: Diagnosis not present

## 2015-01-03 DIAGNOSIS — Y9302 Activity, running: Secondary | ICD-10-CM | POA: Insufficient documentation

## 2015-01-03 DIAGNOSIS — S90512A Abrasion, left ankle, initial encounter: Secondary | ICD-10-CM | POA: Diagnosis not present

## 2015-01-03 DIAGNOSIS — R2689 Other abnormalities of gait and mobility: Secondary | ICD-10-CM | POA: Diagnosis not present

## 2015-01-03 DIAGNOSIS — Y998 Other external cause status: Secondary | ICD-10-CM | POA: Insufficient documentation

## 2015-01-03 DIAGNOSIS — Z79899 Other long term (current) drug therapy: Secondary | ICD-10-CM | POA: Diagnosis not present

## 2015-01-03 DIAGNOSIS — S99922A Unspecified injury of left foot, initial encounter: Secondary | ICD-10-CM | POA: Diagnosis present

## 2015-01-03 DIAGNOSIS — G40909 Epilepsy, unspecified, not intractable, without status epilepticus: Secondary | ICD-10-CM | POA: Diagnosis not present

## 2015-01-03 DIAGNOSIS — Z720 Tobacco use: Secondary | ICD-10-CM | POA: Diagnosis not present

## 2015-01-03 DIAGNOSIS — R42 Dizziness and giddiness: Secondary | ICD-10-CM | POA: Diagnosis not present

## 2015-01-03 MED ORDER — OXYCODONE-ACETAMINOPHEN 5-325 MG PO TABS
1.0000 | ORAL_TABLET | Freq: Once | ORAL | Status: AC
  Administered 2015-01-03: 1 via ORAL
  Filled 2015-01-03: qty 1

## 2015-01-03 MED ORDER — MELOXICAM 15 MG PO TABS: 15.0000 mg | ORAL_TABLET | Freq: Every day | ORAL | Status: DC

## 2015-01-03 MED ORDER — HYDROCODONE-ACETAMINOPHEN 5-325 MG PO TABS: 1.0000 | ORAL_TABLET | ORAL | Status: DC | PRN

## 2015-01-03 NOTE — ED Provider Notes (Signed)
[facility] Emergency Department Provider Note  ____________________________________________  Time seen: Approximately 6:18 PM  I have reviewed the triage vital signs and the nursing notes.   HISTORY  Chief Complaint Foot Injury    HPI [name] is a 49 y.o. female who presents to the emergency department complaining of left ankle pain. She states she was in her yard pulling a lack of full of supplies that ran over the back of her ankle. That she is now having pain to area. She is unable to bear weight on same. She reports edema to area. Denies any numbness or tingling or loss of function. She does report a mild abrasion to area. No previous history of injury or surgeries to ankle. Pain is sharp, severe, worse with movement.   Past Medical History  Diagnosis Date  . Vertigo   . Seizures ([place])   . GERD (gastroesophageal reflux disease)   . Cancer (HCC)     CERVICAL  . Depression   . Falls     EASILY  . Shoulder disorder     LIMITED USE OF LEFT SHOULDER AND ARM  . TBI (traumatic brain injury) [facility])     SKULL FX 2009    There are no active problems to display for this patient.   Past Surgical History  Procedure Laterality Date  . Back surgery      LUMBAR FUSIONS X 2  . Leep      Current Outpatient Rx  Name  Route  Sig  Dispense  Refill  . carisoprodol (SOMA) 350 MG tablet   Oral   Take 350 mg by mouth 4 (four) times daily.         Marland Kitchen desvenlafaxine (PRISTIQ) 100 MG 24 hr tablet   Oral   Take 100 mg by mouth daily.         Marland Kitchen gabapentin (NEURONTIN) 300 MG capsule   Oral   Take 300 mg by mouth 3 (three) times daily.         Marland Kitchen HYDROcodone-acetaminophen (NORCO/VICODIN) 5-325 MG tablet   Oral   Take 1 tablet by mouth every 4 (four) hours as needed for moderate pain.   10 tablet   0   . levETIRAcetam (KEPPRA) 500 MG tablet   Oral   Take 500 mg by mouth.         Marland Kitchen LORazepam (ATIVAN) 0.5 MG tablet   Oral   Take 0.5 mg by  mouth at bedtime. AS NEEDED         . meloxicam (MOBIC) 15 MG tablet   Oral   Take 1 tablet (15 mg total) by mouth daily.   30 tablet   0   . omeprazole (PRILOSEC) 40 MG capsule   Oral   Take 40 mg by mouth daily.         . ondansetron (ZOFRAN) 4 MG tablet   Oral   Take 4 mg by mouth every 8 (eight) hours as needed for nausea or vomiting.           Allergies Adhesive and Naproxen  History reviewed. No pertinent family history.  Social History Social History  Substance Use Topics  . Smoking status: Current Every Day Smoker -- 1.00 packs/day    Types: Cigarettes  . Smokeless tobacco: None  . Alcohol Use: No    Review of Systems Constitutional: No fever/chills Eyes: No visual changes. ENT: No sore throat. Cardiovascular: Denies chest pain. Respiratory: Denies shortness of breath. Gastrointestinal: No  abdominal pain.  No nausea, no vomiting.  No diarrhea.  No constipation. Genitourinary: Negative for dysuria. Musculoskeletal: Negative for back pain. Endorses left ankle pain. Skin: Negative for rash. Neurological: Negative for headaches, focal weakness or numbness.  10-point ROS otherwise negative.  ____________________________________________   PHYSICAL EXAM:  VITAL SIGNS: ED Triage Vitals  Enc Vitals Group     BP 01/03/15 1736 101/69 mmHg     Pulse Rate 01/03/15 1736 67     Resp 01/03/15 1736 16     Temp 01/03/15 1736 98.1 F (36.7 C)     Temp Source 01/03/15 1736 Oral     SpO2 01/03/15 1736 98 %     Weight 01/03/15 1736 125 lb (56.7 kg)     Height 01/03/15 1736 5\' 8"  (1.727 m)     Head Cir --      Peak Flow --      Pain Score 01/03/15 1740 7     Pain Loc --      Pain Edu? --      Excl. in [address]? --     Constitutional: Alert and oriented. Well appearing and in no acute distress. Eyes: Conjunctivae are normal. PERRL. EOMI. Head: Atraumatic. Nose: No congestion/rhinnorhea. Mouth/Throat: Mucous membranes are moist.  Oropharynx  non-erythematous. Neck: No stridor.   Cardiovascular: Normal rate, regular rhythm. Grossly normal heart sounds.  Good peripheral circulation. Respiratory: Normal respiratory effort.  No retractions. Lungs CTAB. Gastrointestinal: Soft and nontender. No distention. No abdominal bruits. No CVA tenderness. Musculoskeletal: Edema noted to left ankle when comparing with right ankle. No gross deformity noted. Minor abrasion noted on the lateral aspect of the malleolus. Tenderness to palpation over lateral malleolus. No deformity noted to palpation. Range of motion is limited due to pain. No tenderness to palpation over the base of fifth metatarsal. Neurologic:  Normal speech and language. No gross focal neurologic deficits are appreciated. No gait instability. Skin:  Skin is warm, dry and intact. No rash noted. Superficial abrasion noted to lateral malleolus. No active bleeding. Scabbing in place. Psychiatric: Mood and affect are normal. Speech and behavior are normal.  ____________________________________________   LABS (all labs ordered are listed, but only abnormal results are displayed)  Labs Reviewed - No data to display ____________________________________________  EKG   ____________________________________________  RADIOLOGY   Left ankle x-ray Impression: No fracture or dislocation. ____________________________________________   PROCEDURES  Procedure(s) performed: None  Critical Care performed: No  ____________________________________________   INITIAL IMPRESSION / ASSESSMENT AND PLAN / ED COURSE  Pertinent labs & imaging results that were available during my care of the patient were reviewed by me and considered in my medical decision making (see chart for details).  Agents history, symptoms, physical exam are consistent with contusion to the left ankle. Advised patient of findings and diagnosis and she verbalizes understanding same. I'll provide the patient with  medication for symptom control. She is to use a stirrup ankle brace for stabilization. She is to use the RICE Principal for symptom control. Patient verbalizes understanding of diagnosis and treatment plan and verbalizes compliance with same.  ____________________________________________   FINAL CLINICAL IMPRESSION(S) / ED DIAGNOSES  Final diagnoses:  Ankle contusion, left, initial encounter      Darletta Moll, PA-C 01/03/15 [place], MD 01/04/15 [phone]

## 2015-01-03 NOTE — ED Notes (Signed)
Pt reports having a red wagon roll over left ankle from behind. Pts left ankle is swollen at this time with very small abrasion. Pt reports increased pain with pressure and reports being unable to put weight left foot. Pt can move left foot and denies changes in sensation. Pulse is present and equal to right pedal pulse

## 2015-01-03 NOTE — Discharge Instructions (Signed)
Periosteal Hematoma Periosteal hematoma (bone bruise) is a localized, tender, raised area close to the bone. It can occur from a small hidden fracture of the bone, following surgery, or from other trauma to the area. It typically occurs in bones located close to the surface of the skin, such as the shin, knee, and heel bone. Although it may take 2 or more weeks to completely heal, bone bruises typically are not associated with permanent or serious damage to the bone. If you are taking blood thinners, you may be at greater risk for such injuries.  CAUSES  A bone bruise is usually caused by high-impact trauma to the bone, but it can be caused by sports injuries or twisting injuries. SIGNS AND SYMPTOMS   Severe pain around the injured area that typically lasts longer than a normal bruise.  Difficulty using the bruised area.  Tender, raised area close to the bone.  Discoloration or swelling of the bruised area. DIAGNOSIS  You may need an MRI of the injured area to confirm a bone bruise if your health care provider feels it is necessary. A regular X-ray will not detect a bone bruise, but it will detect a broken bone (fracture). An X-ray may be taken to rule out any fractures. TREATMENT  Often, the best treatment for a bone bruise is resting, icing, and applying cold compresses to the injured area. Over-the-counter medicines may also be recommended for pain control. HOME CARE INSTRUCTIONS  Some things you can do to improve the condition are:   Rest and elevate the area of injury as [place] as it is very tender or swollen.  Apply ice to the injured area:  Put ice in a plastic bag.  Place a towel between your skin and the bag.  Leave the ice on for 20 minutes, 2-3 times a day.  Use an elastic wrap to reduce swelling and protect the injured area. Make sure it is not applied too tightly. If the area around the wrap becomes cold or blue, the wrap is too tight. Wrap it more loosely.  For  activity:  Follow your health care provider's instructions about whether walking with crutches is required. This will depend on how serious your condition is.  Start weight bearing gradually on the bruised part.  Continue to use crutches or a cane until you can stand without causing pain, or as instructed.  If a plaster splint was applied:  Wear the splint until you are seen for a follow-up exam.  Rest it on nothing harder than a pillow the first 24 hours.  Do not put weight on it.  Do not get it wet. You may take it off to take a shower or bath.  You may have been given an elastic bandage to use with or without the plaster splint. The splint is too tight if you have numbness or tingling, or if the skin around the bandage becomes cold and blue. Adjust the bandage to make it comfortable.  If an air splint was applied:  You may alter the amount of air in the splint as needed for comfort.  You may take it off at night and to take a shower or bath.  If the injury was in either leg, wiggle your toes in the splint several times per day if you are able.  Only take over-the-counter or prescription medicines for pain, discomfort, or fever as directed by your health care provider.  Keep all follow-up visits with your health care provider. This includes  any orthopedic referrals, physical therapy, and rehabilitation. Any delay in getting necessary care could result in a delay or failure of the bones to heal. SEEK MEDICAL CARE IF:   You have an increase in bruising, swelling, tenderness, heat, or pain over your injury.  You notice coldness of your toes that does not improve after removing a splint or bandage.  Your pain is not lessened after you take medicine.  You have increased difficulty bearing weight on the injured leg, if the injury is in either leg. SEEK IMMEDIATE MEDICAL CARE IF:   You have severe pain near the injured area or severe pain with stretching.  You have increased  swelling that resulted in a tense, hard area or a loss of sensation in the area of the injury.  You have pale, cool skin below the area of the injury (in an extremity) that does not go away after removing a splint or bandage. MAKE SURE YOU:   Understand these instructions.  Will watch your condition.  Will get help right away if you are not doing well or get worse.   This information is not intended to replace advice given to you by your health care provider. Make sure you discuss any questions you have with your health care provider.   Document Released: 03/25/2004 Document Revised: 12/06/2012 Document Reviewed: 08/04/2012 Elsevier Interactive Patient Education 2016 Elsevier Inc.  Cryotherapy Cryotherapy is when you put ice on your injury. Ice helps lessen pain and puffiness (swelling) after an injury. Ice works the best when you start using it in the first 24 to 48 hours after an injury. HOME CARE  Put a dry or damp towel between the ice pack and your skin.  You may press gently on the ice pack.  Leave the ice on for no more than 10 to 20 minutes at a time.  Check your skin after 5 minutes to make sure your skin is okay.  Rest at least 20 minutes between ice pack uses.  Stop using ice when your skin loses feeling (numbness).  Do not use ice on someone who cannot tell you when it hurts. This includes small children and people with memory problems (dementia). GET HELP RIGHT AWAY IF:  You have white spots on your skin.  Your skin turns blue or pale.  Your skin feels waxy or hard.  Your puffiness gets worse. MAKE SURE YOU:   Understand these instructions.  Will watch your condition.  Will get help right away if you are not doing well or get worse.   This information is not intended to replace advice given to you by your health care provider. Make sure you discuss any questions you have with your health care provider.   Document Released: 08/04/2007 Document Revised:  05/10/2011 Document Reviewed: 10/08/2010 Elsevier Interactive Patient Education [employer].

## 2015-01-07 ENCOUNTER — Ambulatory Visit
Admission: RE | Admit: 2015-01-07 | Discharge: 2015-01-07 | Disposition: A | Discharge status: Home / Self Care | Payer: Medicare Other | Source: Ambulatory Visit | Attending: Ophthalmology | Admitting: Ophthalmology

## 2015-01-07 ENCOUNTER — Ambulatory Visit: Payer: Medicare Other | Admitting: Anesthesiology

## 2015-01-07 ENCOUNTER — Encounter: Payer: Self-pay | Admitting: Anesthesiology

## 2015-01-07 ENCOUNTER — Encounter
Admission: RE | Disposition: A | Discharge status: Home / Self Care | Payer: Self-pay | Source: Ambulatory Visit | Attending: Ophthalmology

## 2015-01-07 DIAGNOSIS — Z981 Arthrodesis status: Secondary | ICD-10-CM | POA: Diagnosis not present

## 2015-01-07 DIAGNOSIS — F17200 Nicotine dependence, unspecified, uncomplicated: Secondary | ICD-10-CM | POA: Diagnosis not present

## 2015-01-07 DIAGNOSIS — H2511 Age-related nuclear cataract, right eye: Secondary | ICD-10-CM | POA: Insufficient documentation

## 2015-01-07 DIAGNOSIS — Z79899 Other long term (current) drug therapy: Secondary | ICD-10-CM | POA: Insufficient documentation

## 2015-01-07 DIAGNOSIS — Z87820 Personal history of traumatic brain injury: Secondary | ICD-10-CM | POA: Insufficient documentation

## 2015-01-07 DIAGNOSIS — F329 Major depressive disorder, single episode, unspecified: Secondary | ICD-10-CM | POA: Insufficient documentation

## 2015-01-07 DIAGNOSIS — Z91048 Other nonmedicinal substance allergy status: Secondary | ICD-10-CM | POA: Insufficient documentation

## 2015-01-07 DIAGNOSIS — T7840XA Allergy, unspecified, initial encounter: Secondary | ICD-10-CM | POA: Diagnosis not present

## 2015-01-07 DIAGNOSIS — K219 Gastro-esophageal reflux disease without esophagitis: Secondary | ICD-10-CM | POA: Diagnosis not present

## 2015-01-07 DIAGNOSIS — Z8541 Personal history of malignant neoplasm of cervix uteri: Secondary | ICD-10-CM | POA: Insufficient documentation

## 2015-01-07 DIAGNOSIS — Z886 Allergy status to analgesic agent status: Secondary | ICD-10-CM | POA: Insufficient documentation

## 2015-01-07 DIAGNOSIS — Z9889 Other specified postprocedural states: Secondary | ICD-10-CM | POA: Diagnosis not present

## 2015-01-07 DIAGNOSIS — H269 Unspecified cataract: Secondary | ICD-10-CM | POA: Diagnosis present

## 2015-01-07 HISTORY — DX: Major depressive disorder, single episode, unspecified: F32.9

## 2015-01-07 HISTORY — DX: Depression, unspecified: F32.A

## 2015-01-07 HISTORY — DX: Unspecified fall, initial encounter: W19.XXXA

## 2015-01-07 HISTORY — DX: Unspecified intracranial injury with loss of consciousness of unspecified duration, initial encounter: S06.9X9A

## 2015-01-07 HISTORY — DX: Gastro-esophageal reflux disease without esophagitis: K21.9

## 2015-01-07 HISTORY — DX: Joint disorder, unspecified: M25.9

## 2015-01-07 HISTORY — PX: CATARACT EXTRACTION W/PHACO: SHX586

## 2015-01-07 HISTORY — DX: Malignant (primary) neoplasm, unspecified: C80.1

## 2015-01-07 HISTORY — DX: Repeated falls: R29.6

## 2015-01-07 HISTORY — DX: Unspecified convulsions: R56.9

## 2015-01-07 HISTORY — DX: Unspecified intracranial injury with loss of consciousness status unknown, initial encounter: S06.9XAA

## 2015-01-07 HISTORY — DX: Dizziness and giddiness: R42

## 2015-01-07 SURGERY — PHACOEMULSIFICATION, CATARACT, WITH IOL INSERTION
Anesthesia: Monitor Anesthesia Care | Site: Eye | Laterality: Right | Wound class: Clean

## 2015-01-07 MED ORDER — CARBACHOL 0.01 % IO SOLN
INTRAOCULAR | Status: DC | PRN
  Administered 2015-01-07: 0.5 mL via INTRAOCULAR

## 2015-01-07 MED ORDER — TETRACAINE HCL 0.5 % OP SOLN
OPHTHALMIC | Status: AC
  Filled 2015-01-07: qty 2

## 2015-01-07 MED ORDER — CEFUROXIME OPHTHALMIC INJECTION 1 MG/0.1 ML
INJECTION | OPHTHALMIC | Status: AC
  Filled 2015-01-07: qty 0.1

## 2015-01-07 MED ORDER — LIDOCAINE HCL (PF) 1 % IJ SOLN
INTRAMUSCULAR | Status: AC
  Filled 2015-01-07: qty 2

## 2015-01-07 MED ORDER — EPINEPHRINE HCL 1 MG/ML IJ SOLN
INTRAMUSCULAR | Status: AC
  Filled 2015-01-07: qty 1

## 2015-01-07 MED ORDER — SODIUM CHLORIDE 0.9 % IV SOLN: INTRAVENOUS | Status: DC

## 2015-01-07 MED ORDER — MOXIFLOXACIN HCL 0.5 % OP SOLN
OPHTHALMIC | Status: DC | PRN
  Administered 2015-01-07: 1 [drp] via OPHTHALMIC

## 2015-01-07 MED ORDER — EPINEPHRINE HCL 1 MG/ML IJ SOLN
INTRAOCULAR | Status: DC | PRN
  Administered 2015-01-07: 1 mL via OPHTHALMIC

## 2015-01-07 MED ORDER — MOXIFLOXACIN HCL 0.5 % OP SOLN: 1.0000 [drp] | OPHTHALMIC | Status: DC | PRN

## 2015-01-07 MED ORDER — NA CHONDROIT SULF-NA HYALURON 40-17 MG/ML IO SOLN
INTRAOCULAR | Status: DC | PRN
  Administered 2015-01-07: 1 mL via INTRAOCULAR

## 2015-01-07 MED ORDER — POVIDONE-IODINE 5 % OP SOLN
1.0000 "application " | OPHTHALMIC | Status: AC | PRN
  Administered 2015-01-07: 1 via OPHTHALMIC

## 2015-01-07 MED ORDER — CEFUROXIME OPHTHALMIC INJECTION 1 MG/0.1 ML
INJECTION | OPHTHALMIC | Status: DC | PRN
  Administered 2015-01-07: 0.1 mL via INTRACAMERAL

## 2015-01-07 MED ORDER — MOXIFLOXACIN HCL 0.5 % OP SOLN
OPHTHALMIC | Status: AC
  Filled 2015-01-07: qty 3

## 2015-01-07 MED ORDER — POVIDONE-IODINE 5 % OP SOLN
OPHTHALMIC | Status: AC
  Filled 2015-01-07: qty 30

## 2015-01-07 MED ORDER — MIDAZOLAM HCL 2 MG/2ML IJ SOLN
INTRAMUSCULAR | Status: DC | PRN
  Administered 2015-01-07: 1 mg via INTRAVENOUS

## 2015-01-07 MED ORDER — FENTANYL CITRATE (PF) 100 MCG/2ML IJ SOLN
INTRAMUSCULAR | Status: DC | PRN
  Administered 2015-01-07: 50 ug via INTRAVENOUS

## 2015-01-07 MED ORDER — TETRACAINE HCL 0.5 % OP SOLN
1.0000 [drp] | OPHTHALMIC | Status: AC | PRN
  Administered 2015-01-07: 1 [drp] via OPHTHALMIC

## 2015-01-07 MED ORDER — ARMC OPHTHALMIC DILATING GEL
1.0000 "application " | OPHTHALMIC | Status: DC | PRN
  Administered 2015-01-07: 1 via OPHTHALMIC

## 2015-01-07 MED ORDER — NA CHONDROIT SULF-NA HYALURON 40-17 MG/ML IO SOLN
INTRAOCULAR | Status: AC
Start: 2015-01-07 — End: 2015-01-07
  Filled 2015-01-07: qty 1

## 2015-01-07 MED ORDER — ARMC OPHTHALMIC DILATING GEL
OPHTHALMIC | Status: AC
  Filled 2015-01-07: qty 0.25

## 2015-01-07 SURGICAL SUPPLY — 22 items
CANNULA ANT/CHMB 27GA (MISCELLANEOUS) ×3 IMPLANT
CUP MEDICINE 2OZ PLAST GRAD ST (MISCELLANEOUS) ×3 IMPLANT
GLOVE BIO SURGEON STRL SZ8 (GLOVE) ×3 IMPLANT
GLOVE BIOGEL M 6.5 STRL (GLOVE) ×3 IMPLANT
GLOVE SURG LX 8.0 MICRO (GLOVE) ×2
GLOVE SURG LX STRL 8.0 MICRO (GLOVE) ×1 IMPLANT
GOWN STRL REUS W/ TWL LRG LVL3 (GOWN DISPOSABLE) ×2 IMPLANT
GOWN STRL REUS W/TWL LRG LVL3 (GOWN DISPOSABLE) ×4
LENS IOL TECNIS 20.5 (Intraocular Lens) ×3 IMPLANT
LENS IOL TECNIS MONO 1P 20.5 (Intraocular Lens) ×1 IMPLANT
PACK CATARACT (MISCELLANEOUS) ×3 IMPLANT
PACK CATARACT BRASINGTON LX (MISCELLANEOUS) ×3 IMPLANT
PACK EYE AFTER SURG (MISCELLANEOUS) ×3 IMPLANT
SOL BSS BAG (MISCELLANEOUS) ×3
SOL PREP PVP 2OZ (MISCELLANEOUS) ×3
SOLUTION BSS BAG (MISCELLANEOUS) ×1 IMPLANT
SOLUTION PREP PVP 2OZ (MISCELLANEOUS) ×1 IMPLANT
SYR 3ML LL SCALE MARK (SYRINGE) ×3 IMPLANT
SYR 5ML LL (SYRINGE) ×3 IMPLANT
SYR TB 1ML 27GX1/2 LL (SYRINGE) ×3 IMPLANT
WATER STERILE IRR 1000ML POUR (IV SOLUTION) ×3 IMPLANT
WIPE NON LINTING 3.25X3.25 (MISCELLANEOUS) ×3 IMPLANT

## 2015-01-07 NOTE — H&P (Signed)
  All labs reviewed. Abnormal studies sent to patients PCP when indicated.  Previous H&P reviewed, patient examined, there are NO CHANGES.  [name] LOUIS11/8/20169:46 AM

## 2015-01-07 NOTE — Transfer of Care (Signed)
Immediate Anesthesia Transfer of Care Note  Patient: [name]  Procedure(s) Performed: Procedure(s) with comments: CATARACT EXTRACTION PHACO AND INTRAOCULAR LENS PLACEMENT (IOC) (Right) - [place] 0 AP% 0 CDE 0 fluid pack lot #[id number]  Patient Location: sds  Anesthesia Type:MAC  Level of Consciousness: awake  Airway & Oxygen Therapy: Patient Spontanous Breathing  Post-op Assessment: Report given to RN  Post vital signs: Reviewed and stable  Last Vitals:  Filed Vitals:   01/07/15 0836  BP: 134/81  Pulse: 67  Temp: 36.9 C  Resp: 16    Complications: No apparent anesthesia complications

## 2015-01-07 NOTE — Anesthesia Procedure Notes (Signed)
Procedure Name: MAC Date/Time: 01/07/2015 10:06 AM Performed by: Allean Found Pre-anesthesia Checklist: Patient identified, Emergency Drugs available, Suction available, Patient being monitored and Timeout performed Oxygen Delivery Method: Nasal cannula

## 2015-01-07 NOTE — Op Note (Signed)
PREOPERATIVE DIAGNOSIS:  Nuclear sclerotic cataract of the right eye.   POSTOPERATIVE DIAGNOSIS: nuclear sclerotic cataract right eye   OPERATIVE PROCEDURE:  Procedure(s): CATARACT EXTRACTION PHACO AND INTRAOCULAR LENS PLACEMENT (IOC)   SURGEON:  Birder Robson, MD.   ANESTHESIA:  Anesthesiologist: Gunnar Bulla, MD CRNA: Allean Found, CRNA  1.      Managed anesthesia care. 2.      Topical tetracaine drops followed by 2% Xylocaine jelly applied in the preoperative holding area.   COMPLICATIONS:  None.   TECHNIQUE:   Stop and chop   DESCRIPTION OF PROCEDURE:  The patient was examined and consented in the preoperative holding area where the aforementioned topical anesthesia was applied to the right eye and then brought back to the Operating Room where the right eye was prepped and draped in the usual sterile ophthalmic fashion and a lid speculum was placed. A paracentesis was created with the side port blade and the anterior chamber was filled with viscoelastic. A near clear corneal incision was performed with the steel keratome. A continuous curvilinear capsulorrhexis was performed with a cystotome followed by the capsulorrhexis forceps. Hydrodissection and hydrodelineation were carried out with BSS on a blunt cannula. The lens was removed in a stop and chop  technique and the remaining cortical material was removed with the irrigation-aspiration handpiece. The capsular bag was inflated with viscoelastic and the Technis ZCB00  lens was placed in the capsular bag without complication. The remaining viscoelastic was removed from the eye with the irrigation-aspiration handpiece. The wounds were hydrated. The anterior chamber was flushed with Miostat and the eye was inflated to physiologic pressure. 0.1 mL of cefuroxime concentration 10 mg/mL was placed in the anterior chamber. The wounds were found to be water tight. The eye was dressed with Vigamox. The patient was given protective glasses to  wear throughout the day and a shield with which to sleep tonight. The patient was also given drops with which to begin a drop regimen today and will follow-up with me in one day.  Implant Name Type Inv. Item Serial No. Manufacturer Lot No. LRB No. Used  LENS IMPL INTRAOC ZCB00 20.5 - [id number] Intraocular Lens LENS IMPL INTRAOC ZCB00 20.5 [id number] AMO   Right 1   Procedure(s) with comments: CATARACT EXTRACTION PHACO AND INTRAOCULAR LENS PLACEMENT (IOC) (Right) - [place] 0 AP% 0 CDE 0 fluid pack lot #[id number]  Electronically signed: [place] 01/07/2015 10:17 AM

## 2015-01-07 NOTE — Anesthesia Preprocedure Evaluation (Signed)
Anesthesia Evaluation  Patient identified by MRN, date of birth, ID band Patient awake    Reviewed: Allergy & Precautions, NPO status , Patient's Chart, lab work & pertinent test results, reviewed documented beta blocker date and time   Airway Mallampati: II  TM Distance: >3 FB     Dental  (+) Chipped   Pulmonary Current Smoker,           Cardiovascular      Neuro/Psych Seizures -, Well Controlled,  PSYCHIATRIC DISORDERS Depression    GI/Hepatic GERD  Medicated and Controlled,  Endo/Other    Renal/GU      Musculoskeletal   Abdominal   Peds  Hematology   Anesthesia Other Findings   Reproductive/Obstetrics                             Anesthesia Physical Anesthesia Plan  ASA: III  Anesthesia Plan: MAC   Post-op Pain Management:    Induction:   Airway Management Planned:   Additional Equipment:   Intra-op Plan:   Post-operative Plan:   Informed Consent: I have reviewed the patients History and Physical, chart, labs and discussed the procedure including the risks, benefits and alternatives for the proposed anesthesia with the patient or authorized representative who has indicated his/her understanding and acceptance.     Plan Discussed with: CRNA  Anesthesia Plan Comments:         Anesthesia Quick Evaluation

## 2015-01-07 NOTE — Anesthesia Postprocedure Evaluation (Signed)
  Anesthesia Post-op Note  Patient: [name]  Procedure(s) Performed: Procedure(s) with comments: CATARACT EXTRACTION PHACO AND INTRAOCULAR LENS PLACEMENT (IOC) (Right) - [place] 0 AP% 0 CDE 0 fluid pack lot #[id number]  Anesthesia type:MAC  Patient location:sds   Post pain: Pain level controlled  Post assessment: Post-op Vital signs reviewed, Patient's Cardiovascular Status Stable, Respiratory Function Stable, Patent Airway and No signs of Nausea or vomiting  Post vital signs: Reviewed and stable  Last Vitals:  Filed Vitals:   01/07/15 0836  BP: 134/81  Pulse: 67  Temp: 36.9 C  Resp: 16    Level of consciousness: awake, alert  and patient cooperative  Complications: No apparent anesthesia complications

## 2015-01-20 ENCOUNTER — Encounter: Payer: Self-pay | Admitting: *Deleted

## 2015-01-21 DIAGNOSIS — G40919 Epilepsy, unspecified, intractable, without status epilepticus: Secondary | ICD-10-CM | POA: Diagnosis not present

## 2015-01-21 DIAGNOSIS — M5013 Cervical disc disorder with radiculopathy, cervicothoracic region: Secondary | ICD-10-CM | POA: Diagnosis not present

## 2015-01-21 DIAGNOSIS — F410 Panic disorder [episodic paroxysmal anxiety] without agoraphobia: Secondary | ICD-10-CM | POA: Diagnosis not present

## 2015-01-21 DIAGNOSIS — F339 Major depressive disorder, recurrent, unspecified: Secondary | ICD-10-CM | POA: Diagnosis not present

## 2015-01-21 DIAGNOSIS — G478 Other sleep disorders: Secondary | ICD-10-CM | POA: Diagnosis not present

## 2015-01-28 ENCOUNTER — Ambulatory Visit
Admission: RE | Admit: 2015-01-28 | Discharge: 2015-01-28 | Disposition: A | Discharge status: Home / Self Care | Payer: Medicare Other | Source: Ambulatory Visit | Attending: Ophthalmology | Admitting: Ophthalmology

## 2015-01-28 ENCOUNTER — Encounter: Payer: Self-pay | Admitting: Anesthesiology

## 2015-01-28 ENCOUNTER — Ambulatory Visit: Payer: Medicare Other | Admitting: Anesthesiology

## 2015-01-28 ENCOUNTER — Encounter
Admission: RE | Disposition: A | Discharge status: Home / Self Care | Payer: Self-pay | Source: Ambulatory Visit | Attending: Ophthalmology

## 2015-01-28 DIAGNOSIS — R42 Dizziness and giddiness: Secondary | ICD-10-CM | POA: Diagnosis not present

## 2015-01-28 DIAGNOSIS — Z981 Arthrodesis status: Secondary | ICD-10-CM | POA: Insufficient documentation

## 2015-01-28 DIAGNOSIS — Z79899 Other long term (current) drug therapy: Secondary | ICD-10-CM | POA: Insufficient documentation

## 2015-01-28 DIAGNOSIS — Z91048 Other nonmedicinal substance allergy status: Secondary | ICD-10-CM | POA: Insufficient documentation

## 2015-01-28 DIAGNOSIS — Z791 Long term (current) use of non-steroidal anti-inflammatories (NSAID): Secondary | ICD-10-CM | POA: Diagnosis not present

## 2015-01-28 DIAGNOSIS — H269 Unspecified cataract: Secondary | ICD-10-CM | POA: Diagnosis present

## 2015-01-28 DIAGNOSIS — Z886 Allergy status to analgesic agent status: Secondary | ICD-10-CM | POA: Diagnosis not present

## 2015-01-28 DIAGNOSIS — H2512 Age-related nuclear cataract, left eye: Secondary | ICD-10-CM | POA: Insufficient documentation

## 2015-01-28 DIAGNOSIS — F329 Major depressive disorder, single episode, unspecified: Secondary | ICD-10-CM | POA: Insufficient documentation

## 2015-01-28 DIAGNOSIS — F332 Major depressive disorder, recurrent severe without psychotic features: Secondary | ICD-10-CM | POA: Diagnosis not present

## 2015-01-28 DIAGNOSIS — K219 Gastro-esophageal reflux disease without esophagitis: Secondary | ICD-10-CM | POA: Insufficient documentation

## 2015-01-28 DIAGNOSIS — Z9889 Other specified postprocedural states: Secondary | ICD-10-CM | POA: Diagnosis not present

## 2015-01-28 DIAGNOSIS — F17200 Nicotine dependence, unspecified, uncomplicated: Secondary | ICD-10-CM | POA: Insufficient documentation

## 2015-01-28 DIAGNOSIS — Z87820 Personal history of traumatic brain injury: Secondary | ICD-10-CM | POA: Insufficient documentation

## 2015-01-28 DIAGNOSIS — R569 Unspecified convulsions: Secondary | ICD-10-CM | POA: Insufficient documentation

## 2015-01-28 DIAGNOSIS — Z9841 Cataract extraction status, right eye: Secondary | ICD-10-CM | POA: Diagnosis not present

## 2015-01-28 DIAGNOSIS — Z8541 Personal history of malignant neoplasm of cervix uteri: Secondary | ICD-10-CM | POA: Insufficient documentation

## 2015-01-28 DIAGNOSIS — Z79891 Long term (current) use of opiate analgesic: Secondary | ICD-10-CM | POA: Insufficient documentation

## 2015-01-28 HISTORY — PX: CATARACT EXTRACTION W/PHACO: SHX586

## 2015-01-28 LAB — URINE DRUG SCREEN, QUALITATIVE (ARMC ONLY)
Amphetamines, Ur Screen: NOT DETECTED
BARBITURATES, UR SCREEN: NOT DETECTED
Benzodiazepine, Ur Scrn: NOT DETECTED
CANNABINOID 50 NG, UR ~~LOC~~: POSITIVE — AB
Cocaine Metabolite,Ur ~~LOC~~: NOT DETECTED
MDMA (ECSTASY) UR SCREEN: NOT DETECTED
METHADONE SCREEN, URINE: NOT DETECTED
OPIATE, UR SCREEN: NOT DETECTED
Phencyclidine (PCP) Ur S: NOT DETECTED
Tricyclic, Ur Screen: NOT DETECTED

## 2015-01-28 LAB — POCT PREGNANCY, URINE: Preg Test, Ur: NEGATIVE

## 2015-01-28 SURGERY — PHACOEMULSIFICATION, CATARACT, WITH IOL INSERTION
Anesthesia: Monitor Anesthesia Care | Site: Eye | Laterality: Left | Wound class: Clean

## 2015-01-28 MED ORDER — BSS IO SOLN
INTRAOCULAR | Status: DC | PRN
  Administered 2015-01-28: 1 mL via OPHTHALMIC

## 2015-01-28 MED ORDER — SODIUM CHLORIDE 0.9 % IV SOLN
INTRAVENOUS | Status: DC
  Administered 2015-01-28: 09:00:00 via INTRAVENOUS

## 2015-01-28 MED ORDER — MOXIFLOXACIN HCL 0.5 % OP SOLN: 1.0000 [drp] | OPHTHALMIC | Status: DC | PRN

## 2015-01-28 MED ORDER — MIDAZOLAM HCL 2 MG/2ML IJ SOLN
INTRAMUSCULAR | Status: DC | PRN
  Administered 2015-01-28 (×2): 1 mg via INTRAVENOUS

## 2015-01-28 MED ORDER — NA CHONDROIT SULF-NA HYALURON 40-17 MG/ML IO SOLN
INTRAOCULAR | Status: DC | PRN
  Administered 2015-01-28: 1 mL via INTRAOCULAR

## 2015-01-28 MED ORDER — POVIDONE-IODINE 5 % OP SOLN
OPHTHALMIC | Status: AC
  Administered 2015-01-28: 1 via OPHTHALMIC
  Filled 2015-01-28: qty 30

## 2015-01-28 MED ORDER — EPINEPHRINE HCL 1 MG/ML IJ SOLN
INTRAMUSCULAR | Status: AC
  Filled 2015-01-28: qty 1

## 2015-01-28 MED ORDER — MOXIFLOXACIN HCL 0.5 % OP SOLN
OPHTHALMIC | Status: DC | PRN
  Administered 2015-01-28: 1 [drp] via OPHTHALMIC

## 2015-01-28 MED ORDER — NA CHONDROIT SULF-NA HYALURON 40-17 MG/ML IO SOLN
INTRAOCULAR | Status: AC
  Filled 2015-01-28: qty 1

## 2015-01-28 MED ORDER — TETRACAINE HCL 0.5 % OP SOLN
OPHTHALMIC | Status: AC
  Administered 2015-01-28: 1 [drp] via OPHTHALMIC
  Filled 2015-01-28: qty 2

## 2015-01-28 MED ORDER — ARMC OPHTHALMIC DILATING GEL
OPHTHALMIC | Status: AC
  Administered 2015-01-28: 1 via OPHTHALMIC
  Filled 2015-01-28: qty 0.25

## 2015-01-28 MED ORDER — CEFUROXIME OPHTHALMIC INJECTION 1 MG/0.1 ML
INJECTION | OPHTHALMIC | Status: DC | PRN
  Administered 2015-01-28: .1 mL via INTRACAMERAL

## 2015-01-28 MED ORDER — POVIDONE-IODINE 5 % OP SOLN
1.0000 "application " | OPHTHALMIC | Status: AC | PRN
  Administered 2015-01-28: 1 via OPHTHALMIC

## 2015-01-28 MED ORDER — TETRACAINE HCL 0.5 % OP SOLN
1.0000 [drp] | OPHTHALMIC | Status: AC | PRN
  Administered 2015-01-28: 1 [drp] via OPHTHALMIC

## 2015-01-28 MED ORDER — MOXIFLOXACIN HCL 0.5 % OP SOLN
OPHTHALMIC | Status: AC
  Filled 2015-01-28: qty 3

## 2015-01-28 MED ORDER — FENTANYL CITRATE (PF) 100 MCG/2ML IJ SOLN
INTRAMUSCULAR | Status: DC | PRN
  Administered 2015-01-28 (×2): 50 ug via INTRAVENOUS

## 2015-01-28 MED ORDER — ARMC OPHTHALMIC DILATING GEL
1.0000 "application " | OPHTHALMIC | Status: AC | PRN
  Administered 2015-01-28 (×2): 1 via OPHTHALMIC

## 2015-01-28 MED ORDER — CARBACHOL 0.01 % IO SOLN
INTRAOCULAR | Status: DC | PRN
  Administered 2015-01-28: .5 mL via INTRAOCULAR

## 2015-01-28 SURGICAL SUPPLY — 22 items
CANNULA ANT/CHMB 27GA (MISCELLANEOUS) ×3 IMPLANT
CUP MEDICINE 2OZ PLAST GRAD ST (MISCELLANEOUS) ×3 IMPLANT
GLOVE BIO SURGEON STRL SZ8 (GLOVE) ×3 IMPLANT
GLOVE BIOGEL M 6.5 STRL (GLOVE) ×3 IMPLANT
GLOVE SURG LX 8.0 MICRO (GLOVE) ×2
GLOVE SURG LX STRL 8.0 MICRO (GLOVE) ×1 IMPLANT
GOWN STRL REUS W/ TWL LRG LVL3 (GOWN DISPOSABLE) ×2 IMPLANT
GOWN STRL REUS W/TWL LRG LVL3 (GOWN DISPOSABLE) ×4
LENS IOL TECNIS 20.5 (Intraocular Lens) ×3 IMPLANT
LENS IOL TECNIS MONO 1P 20.5 (Intraocular Lens) ×1 IMPLANT
PACK CATARACT (MISCELLANEOUS) ×3 IMPLANT
PACK CATARACT BRASINGTON LX (MISCELLANEOUS) ×3 IMPLANT
PACK EYE AFTER SURG (MISCELLANEOUS) ×3 IMPLANT
SOL BSS BAG (MISCELLANEOUS) ×3
SOL PREP PVP 2OZ (MISCELLANEOUS) ×3
SOLUTION BSS BAG (MISCELLANEOUS) ×1 IMPLANT
SOLUTION PREP PVP 2OZ (MISCELLANEOUS) ×1 IMPLANT
SYR 3ML LL SCALE MARK (SYRINGE) ×3 IMPLANT
SYR 5ML LL (SYRINGE) ×3 IMPLANT
SYR TB 1ML 27GX1/2 LL (SYRINGE) ×3 IMPLANT
WATER STERILE IRR 1000ML POUR (IV SOLUTION) ×3 IMPLANT
WIPE NON LINTING 3.25X3.25 (MISCELLANEOUS) ×3 IMPLANT

## 2015-01-28 NOTE — Discharge Instructions (Signed)
AMBULATORY SURGERY  DISCHARGE INSTRUCTIONS   1) The drugs that you were given will stay in your system until tomorrow so for the next 24 hours you should not:  A) Drive an automobile B) Make any legal decisions C) Drink any alcoholic beverage   2) You may resume regular meals tomorrow.  Today it is better to start with liquids and gradually work up to solid foods.  You may eat anything you prefer, but it is better to start with liquids, then soup and crackers, and gradually work up to solid foods.   3) Please notify your doctor immediately if you have any unusual bleeding, trouble breathing, redness and pain at the surgery site, drainage, fever, or pain not relieved by medication.    4) Additional Instructions:     Eye Surgery Discharge Instructions  Expect mild scratchy sensation or mild soreness. DO NOT RUB YOUR EYE!  The day of surgery:  Minimal physical activity, but bed rest is not required  No reading, computer work, or close hand work  No bending, lifting, or straining.  May watch TV  For 24 hours:  No driving, legal decisions, or alcoholic beverages  Safety precautions  Eat anything you prefer: It is better to start with liquids, then soup then solid foods.  _____ Eye patch should be worn until postoperative exam tomorrow.  ____ Solar shield eyeglasses should be worn for comfort in the sunlight/patch while sleeping  Resume all regular medications including aspirin or Coumadin if these were discontinued prior to surgery. You may shower, bathe, shave, or wash your hair. Tylenol may be taken for mild discomfort.  Call your doctor if you experience significant pain, nausea, or vomiting, fever > 101 or other signs of infection. [phone] or [phone] Specific instructions:  Follow-up Information    Follow up with PORFILIO,WILLIAM LOUIS, MD In 1 day.   Specialty:  Ophthalmology   Why:  November 30 at 11:00am   Contact information:   [place] [address] [phone]        Please contact your physician with any problems or Same Day Surgery at [phone], Monday through Friday 6 am to 4 pm, or [place] at [facility] number at [phone].

## 2015-01-28 NOTE — Anesthesia Postprocedure Evaluation (Signed)
Anesthesia Post Note  Patient: [name]  Procedure(s) Performed: Procedure(s) (LRB): CATARACT EXTRACTION PHACO AND INTRAOCULAR LENS PLACEMENT (IOC) (Left)  Patient location during evaluation: Other Anesthesia Type: MAC Level of consciousness: awake and alert Pain management: pain level controlled Vital Signs Assessment: post-procedure vital signs reviewed and stable Respiratory status: spontaneous breathing Anesthetic complications: no    Last Vitals:  Filed Vitals:   01/28/15 1015 01/28/15 1021  BP: 136/81 138/77  Pulse: 95 59  Temp: 37 C 37 C  Resp: 16 16    Last Pain: There were no vitals filed for this visit.               Shelby Peltz S

## 2015-01-28 NOTE — Op Note (Signed)
PREOPERATIVE DIAGNOSIS:  Nuclear sclerotic cataract of the left eye.   POSTOPERATIVE DIAGNOSIS:  nuclear sclerotic cataract left eye   OPERATIVE PROCEDURE:  Procedure(s): CATARACT EXTRACTION PHACO AND INTRAOCULAR LENS PLACEMENT (IOC)   SURGEON:  Birder Robson, MD.   ANESTHESIA:   Anesthesiologist: Gunnar Bulla, MD CRNA: Johnna Acosta, CRNA  1.      Managed anesthesia care. 2.      Topical tetracaine drops followed by 2% Xylocaine jelly applied in the preoperative holding area.   COMPLICATIONS:  None.   TECHNIQUE:   Stop and chop   DESCRIPTION OF PROCEDURE:  The patient was examined and consented in the preoperative holding area where the aforementioned topical anesthesia was applied to the left eye and then brought back to the Operating Room where the left eye was prepped and draped in the usual sterile ophthalmic fashion and a lid speculum was placed. A paracentesis was created with the side port blade and the anterior chamber was filled with viscoelastic. A near clear corneal incision was performed with the steel keratome. A continuous curvilinear capsulorrhexis was performed with a cystotome followed by the capsulorrhexis forceps. Hydrodissection and hydrodelineation were carried out with BSS on a blunt cannula. The lens was removed in a stop and chop  technique and the remaining cortical material was removed with the irrigation-aspiration handpiece. The capsular bag was inflated with viscoelastic and the Technis ZCB00 lens was placed in the capsular bag without complication. The remaining viscoelastic was removed from the eye with the irrigation-aspiration handpiece. The wounds were hydrated. The anterior chamber was flushed with Miostat and the eye was inflated to physiologic pressure. 0.1 mL of cefuroxime concentration 10 mg/mL was placed in the anterior chamber. The wounds were found to be water tight. The eye was dressed with Vigamox. The patient was given protective glasses to  wear throughout the day and a shield with which to sleep tonight. The patient was also given drops with which to begin a drop regimen today and will follow-up with me in one day.  Implant Name Type Inv. Item Serial No. Manufacturer Lot No. LRB No. Used  LENS IMPL INTRAOC ZCB00 20.5 - [id number] Intraocular Lens LENS IMPL INTRAOC ZCB00 20.5 [id number]   Left 1   Procedure(s) with comments: CATARACT EXTRACTION PHACO AND INTRAOCULAR LENS PLACEMENT (IOC) (Left) - [place] 00:18 AP%7.1 CDE1.32 fluid pack lot # [id number]  Electronically signed: Robbi Spells LOUIS 01/28/2015 10:13 AM

## 2015-01-28 NOTE — Anesthesia Preprocedure Evaluation (Addendum)
Anesthesia Evaluation  Patient identified by MRN, date of birth, ID band Patient awake    Reviewed: Allergy & Precautions, NPO status , Patient's Chart, lab work & pertinent test results, reviewed documented beta blocker date and time   Airway Mallampati: II  TM Distance: >3 FB     Dental  (+) Chipped   Pulmonary Current Smoker,           Cardiovascular      Neuro/Psych Seizures -,  PSYCHIATRIC DISORDERS Depression    GI/Hepatic GERD  ,  Endo/Other    Renal/GU      Musculoskeletal   Abdominal   Peds  Hematology   Anesthesia Other Findings Mostly petite mal seizures. Last 2 wks ago. Overbite. MJ.  Reproductive/Obstetrics                            Anesthesia Physical Anesthesia Plan  ASA: III  Anesthesia Plan: MAC   Post-op Pain Management:    Induction:   Airway Management Planned:   Additional Equipment:   Intra-op Plan:   Post-operative Plan:   Informed Consent: I have reviewed the patients History and Physical, chart, labs and discussed the procedure including the risks, benefits and alternatives for the proposed anesthesia with the patient or authorized representative who has indicated his/her understanding and acceptance.     Plan Discussed with: CRNA  Anesthesia Plan Comments:         Anesthesia Quick Evaluation

## 2015-01-28 NOTE — OR Nursing (Signed)
Clarified medication orders with Dr George Ina.

## 2015-01-28 NOTE — Anesthesia Procedure Notes (Signed)
Procedure Name: MAC Date/Time: 01/28/2015 9:53 AM Performed by: Johnna Acosta Pre-anesthesia Checklist: Patient identified, Emergency Drugs available, Suction available, Patient being monitored and Timeout performed Patient Re-evaluated:Patient Re-evaluated prior to inductionOxygen Delivery Method: Nasal cannula

## 2015-01-28 NOTE — H&P (Signed)
  All labs reviewed. Abnormal studies sent to patients PCP when indicated.  Previous H&P reviewed, patient examined, there are NO CHANGES.  [name] LOUIS11/29/20169:44 AM

## 2015-01-28 NOTE — Transfer of Care (Signed)
Immediate Anesthesia Transfer of Care Note  Patient: [name]  Procedure(s) Performed: Procedure(s) with comments: CATARACT EXTRACTION PHACO AND INTRAOCULAR LENS PLACEMENT (IOC) (Left) - [place] 00:18 AP%7.1 CDE1.32 fluid pack lot # [id number]  Patient Location: PACU  Anesthesia Type:MAC  Level of Consciousness: awake, alert  and oriented  Airway & Oxygen Therapy: Patient Spontanous Breathing  Post-op Assessment: Report given to RN  Post vital signs: Reviewed and stable  Last Vitals: There were no vitals filed for this visit.  Complications: No apparent anesthesia complications

## 2015-01-31 DIAGNOSIS — R0683 Snoring: Secondary | ICD-10-CM | POA: Diagnosis not present

## 2015-02-27 DIAGNOSIS — F17210 Nicotine dependence, cigarettes, uncomplicated: Secondary | ICD-10-CM | POA: Diagnosis not present

## 2015-02-27 DIAGNOSIS — F4312 Post-traumatic stress disorder, chronic: Secondary | ICD-10-CM | POA: Diagnosis not present

## 2015-02-27 DIAGNOSIS — G40919 Epilepsy, unspecified, intractable, without status epilepticus: Secondary | ICD-10-CM | POA: Diagnosis not present

## 2015-02-27 DIAGNOSIS — M5013 Cervical disc disorder with radiculopathy, cervicothoracic region: Secondary | ICD-10-CM | POA: Diagnosis not present

## 2015-02-27 DIAGNOSIS — J069 Acute upper respiratory infection, unspecified: Secondary | ICD-10-CM | POA: Diagnosis not present

## 2015-02-27 DIAGNOSIS — Z0001 Encounter for general adult medical examination with abnormal findings: Secondary | ICD-10-CM | POA: Diagnosis not present

## 2015-04-15 DIAGNOSIS — Z716 Tobacco abuse counseling: Secondary | ICD-10-CM | POA: Diagnosis not present

## 2015-04-15 DIAGNOSIS — R569 Unspecified convulsions: Secondary | ICD-10-CM | POA: Diagnosis not present

## 2015-05-05 DIAGNOSIS — F39 Unspecified mood [affective] disorder: Secondary | ICD-10-CM | POA: Insufficient documentation

## 2015-05-05 DIAGNOSIS — R569 Unspecified convulsions: Secondary | ICD-10-CM | POA: Diagnosis not present

## 2015-06-02 IMAGING — CR NECK SOFT TISSUES - 1+ VIEW
1 series · 2 of 2 positions shown · non-contrast
Comparison: None.

CLINICAL DATA: Tongue swelling and difficulty breathing. Acuity of
symptoms not provided.

EXAM:
NECK SOFT TISSUES - 1+ VIEW

[Series 1: dxr soft tissue neck · 0.14mm/px · 2 of 2 slices shown]
[im 1/2]
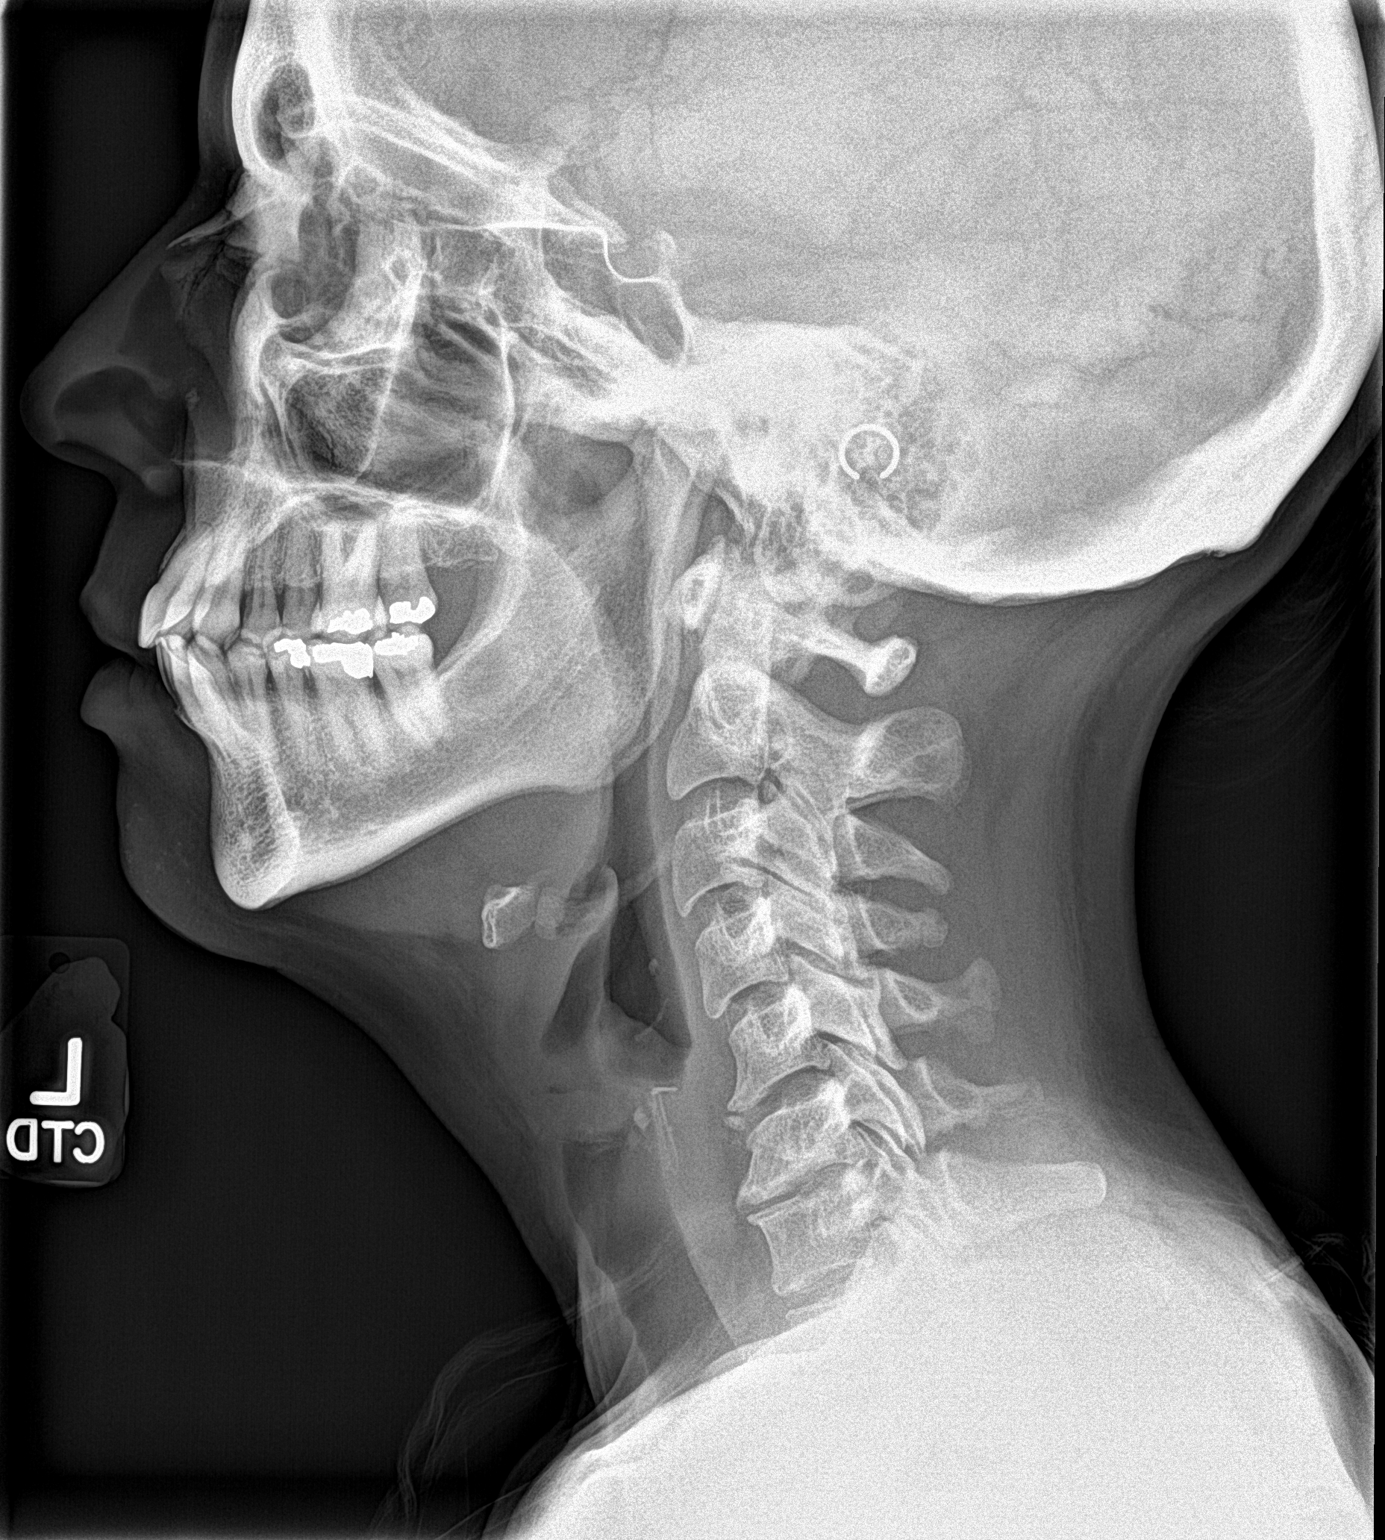
[im 2/2]
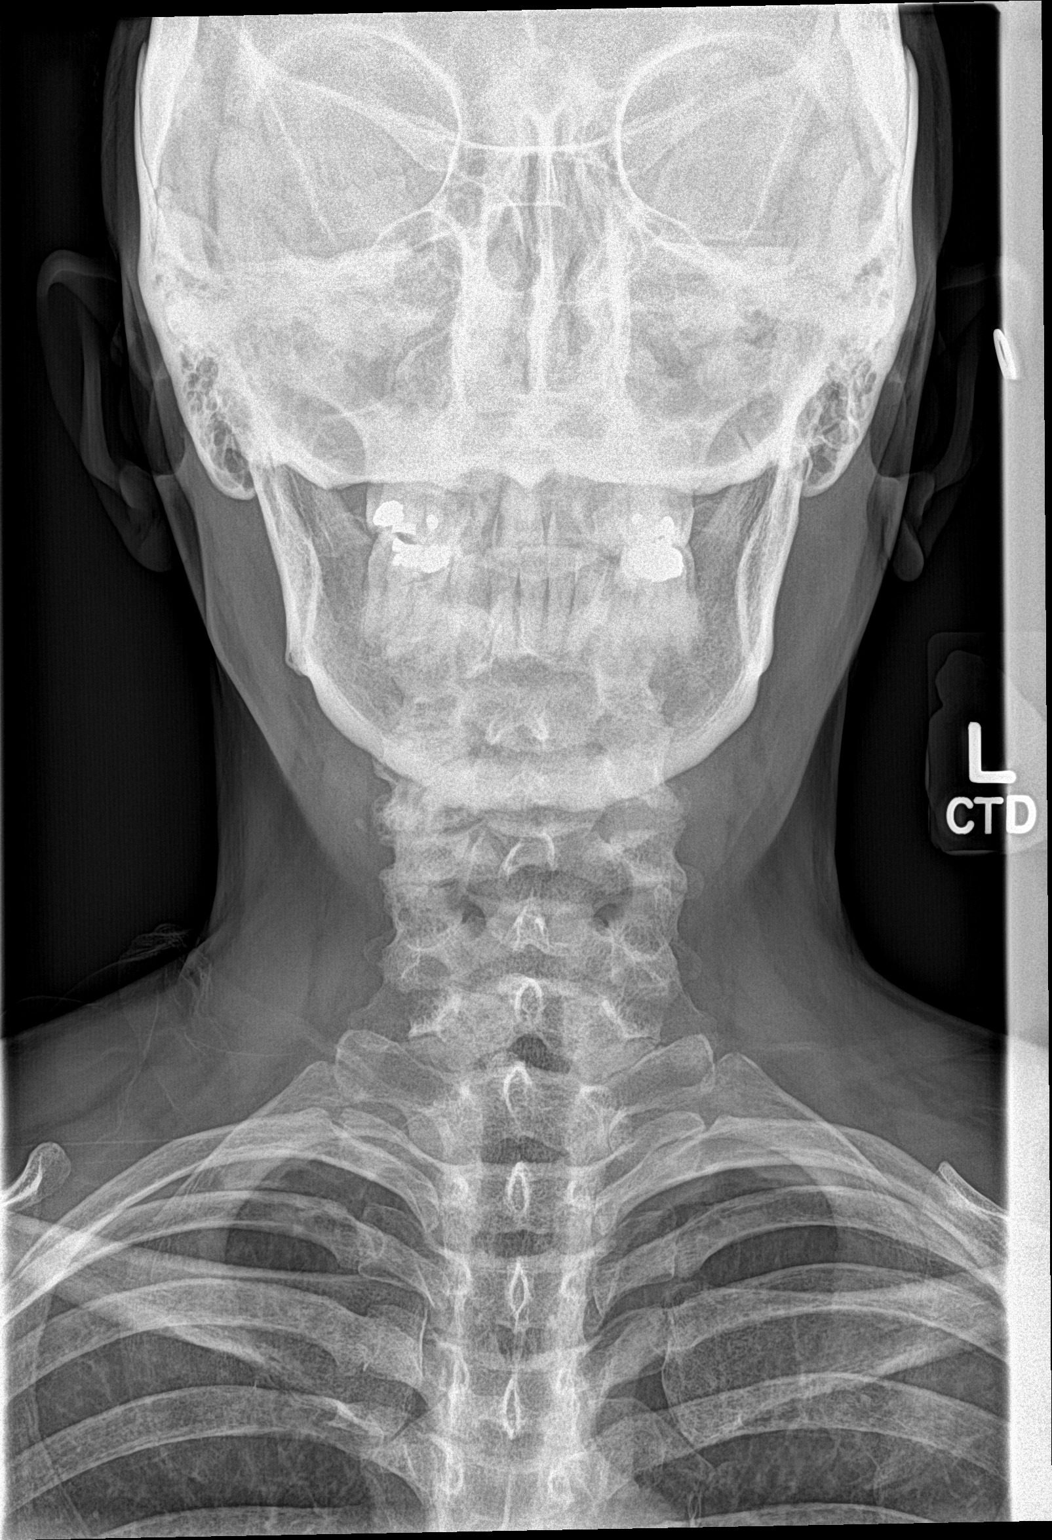

[2 of 2 positions shown; findings below may reference images not displayed]

FINDINGS: There is no evidence of retropharyngeal soft tissue swelling or
epiglottic enlargement. The cervical airway is unremarkable and no
radio-opaque foreign body identified.
IMPRESSION: Negative.

  By: Keneisha Kun Dragnar

## 2015-06-13 ENCOUNTER — Other Ambulatory Visit: Payer: Self-pay | Admitting: Physician Assistant

## 2015-06-13 DIAGNOSIS — R1310 Dysphagia, unspecified: Secondary | ICD-10-CM

## 2015-07-03 ENCOUNTER — Other Ambulatory Visit: Payer: Self-pay | Admitting: Physician Assistant

## 2015-07-03 DIAGNOSIS — Z1231 Encounter for screening mammogram for malignant neoplasm of breast: Secondary | ICD-10-CM

## 2015-07-14 DIAGNOSIS — Z124 Encounter for screening for malignant neoplasm of cervix: Secondary | ICD-10-CM | POA: Diagnosis not present

## 2015-07-16 ENCOUNTER — Other Ambulatory Visit: Payer: Self-pay | Admitting: Physician Assistant

## 2015-07-16 ENCOUNTER — Ambulatory Visit
Admission: RE | Admit: 2015-07-16 | Discharge: 2015-07-16 | Disposition: A | Discharge status: Home / Self Care | Payer: Medicare Other | Source: Ambulatory Visit | Attending: Physician Assistant | Admitting: Physician Assistant

## 2015-07-16 DIAGNOSIS — Z1231 Encounter for screening mammogram for malignant neoplasm of breast: Secondary | ICD-10-CM

## 2015-07-21 ENCOUNTER — Other Ambulatory Visit: Payer: Self-pay | Admitting: Physician Assistant

## 2015-07-21 DIAGNOSIS — N63 Unspecified lump in unspecified breast: Secondary | ICD-10-CM

## 2015-07-29 ENCOUNTER — Ambulatory Visit
Admission: RE | Admit: 2015-07-29 | Discharge: 2015-07-29 | Disposition: A | Discharge status: Home / Self Care | Payer: Medicare Other | Source: Ambulatory Visit | Attending: Physician Assistant | Admitting: Physician Assistant

## 2015-07-29 DIAGNOSIS — N63 Unspecified lump in unspecified breast: Secondary | ICD-10-CM

## 2015-08-05 DIAGNOSIS — F39 Unspecified mood [affective] disorder: Secondary | ICD-10-CM | POA: Diagnosis not present

## 2015-08-05 DIAGNOSIS — G479 Sleep disorder, unspecified: Secondary | ICD-10-CM | POA: Diagnosis not present

## 2015-08-05 DIAGNOSIS — R569 Unspecified convulsions: Secondary | ICD-10-CM | POA: Diagnosis not present

## 2015-08-11 ENCOUNTER — Ambulatory Visit: Admission: RE | Admit: 2015-08-11 | Payer: Medicare Other | Source: Ambulatory Visit

## 2015-08-16 DIAGNOSIS — J018 Other acute sinusitis: Secondary | ICD-10-CM | POA: Diagnosis not present

## 2015-08-16 DIAGNOSIS — J209 Acute bronchitis, unspecified: Secondary | ICD-10-CM | POA: Diagnosis not present

## 2015-08-16 DIAGNOSIS — L988 Other specified disorders of the skin and subcutaneous tissue: Secondary | ICD-10-CM | POA: Diagnosis not present

## 2015-08-16 DIAGNOSIS — R05 Cough: Secondary | ICD-10-CM | POA: Diagnosis not present

## 2015-08-28 DIAGNOSIS — J0101 Acute recurrent maxillary sinusitis: Secondary | ICD-10-CM | POA: Diagnosis not present

## 2015-10-07 DIAGNOSIS — F39 Unspecified mood [affective] disorder: Secondary | ICD-10-CM | POA: Diagnosis not present

## 2015-10-07 DIAGNOSIS — R569 Unspecified convulsions: Secondary | ICD-10-CM | POA: Diagnosis not present

## 2015-10-07 DIAGNOSIS — G479 Sleep disorder, unspecified: Secondary | ICD-10-CM | POA: Diagnosis not present

## 2015-10-08 DIAGNOSIS — Z23 Encounter for immunization: Secondary | ICD-10-CM | POA: Diagnosis not present

## 2015-11-25 ENCOUNTER — Other Ambulatory Visit: Payer: Self-pay | Admitting: Physician Assistant

## 2015-11-25 DIAGNOSIS — N644 Mastodynia: Secondary | ICD-10-CM

## 2015-12-11 DIAGNOSIS — Z0001 Encounter for general adult medical examination with abnormal findings: Secondary | ICD-10-CM | POA: Diagnosis not present

## 2015-12-11 DIAGNOSIS — R634 Abnormal weight loss: Secondary | ICD-10-CM | POA: Diagnosis not present

## 2015-12-11 DIAGNOSIS — R5383 Other fatigue: Secondary | ICD-10-CM | POA: Diagnosis not present

## 2015-12-18 DIAGNOSIS — J309 Allergic rhinitis, unspecified: Secondary | ICD-10-CM | POA: Diagnosis not present

## 2015-12-18 DIAGNOSIS — M5013 Cervical disc disorder with radiculopathy, cervicothoracic region: Secondary | ICD-10-CM | POA: Diagnosis not present

## 2015-12-18 DIAGNOSIS — E782 Mixed hyperlipidemia: Secondary | ICD-10-CM | POA: Diagnosis not present

## 2015-12-26 ENCOUNTER — Ambulatory Visit: Payer: Medicare Other | Admitting: Primary Care

## 2015-12-29 ENCOUNTER — Other Ambulatory Visit: Payer: Medicare Other

## 2015-12-29 ENCOUNTER — Ambulatory Visit: Admission: RE | Admit: 2015-12-29 | Payer: Medicare Other | Source: Ambulatory Visit

## 2015-12-30 ENCOUNTER — Ambulatory Visit: Payer: Medicare Other | Admitting: Primary Care

## 2016-01-01 DIAGNOSIS — H43813 Vitreous degeneration, bilateral: Secondary | ICD-10-CM | POA: Diagnosis not present

## 2016-01-07 DIAGNOSIS — G479 Sleep disorder, unspecified: Secondary | ICD-10-CM | POA: Diagnosis not present

## 2016-01-07 DIAGNOSIS — R569 Unspecified convulsions: Secondary | ICD-10-CM | POA: Diagnosis not present

## 2016-01-07 DIAGNOSIS — F39 Unspecified mood [affective] disorder: Secondary | ICD-10-CM | POA: Diagnosis not present

## 2016-01-08 ENCOUNTER — Encounter: Payer: Self-pay | Admitting: Primary Care

## 2016-01-08 ENCOUNTER — Ambulatory Visit (INDEPENDENT_AMBULATORY_CARE_PROVIDER_SITE_OTHER): Payer: Medicare Other | Admitting: Primary Care

## 2016-01-08 VITALS — BP 112/78 | HR 68 | Temp 98.4°F | Ht 67.0 in | Wt 126.8 lb

## 2016-01-08 DIAGNOSIS — F329 Major depressive disorder, single episode, unspecified: Secondary | ICD-10-CM | POA: Insufficient documentation

## 2016-01-08 DIAGNOSIS — F322 Major depressive disorder, single episode, severe without psychotic features: Secondary | ICD-10-CM | POA: Insufficient documentation

## 2016-01-08 DIAGNOSIS — G40909 Epilepsy, unspecified, not intractable, without status epilepticus: Secondary | ICD-10-CM

## 2016-01-08 DIAGNOSIS — K219 Gastro-esophageal reflux disease without esophagitis: Secondary | ICD-10-CM

## 2016-01-08 DIAGNOSIS — N6320 Unspecified lump in the left breast, unspecified quadrant: Secondary | ICD-10-CM | POA: Diagnosis not present

## 2016-01-08 DIAGNOSIS — R928 Other abnormal and inconclusive findings on diagnostic imaging of breast: Secondary | ICD-10-CM

## 2016-01-08 DIAGNOSIS — F418 Other specified anxiety disorders: Secondary | ICD-10-CM

## 2016-01-08 DIAGNOSIS — G8929 Other chronic pain: Secondary | ICD-10-CM | POA: Insufficient documentation

## 2016-01-08 DIAGNOSIS — F419 Anxiety disorder, unspecified: Secondary | ICD-10-CM

## 2016-01-08 DIAGNOSIS — G8921 Chronic pain due to trauma: Secondary | ICD-10-CM | POA: Diagnosis not present

## 2016-01-08 NOTE — Assessment & Plan Note (Signed)
History of back trauma at age 50 and TBI in 2009. Previously managed with prior PCP, now working to get in with pain management. Referral placed by neurology.

## 2016-01-08 NOTE — Progress Notes (Signed)
Subjective:    Patient ID: [name], female    DOB: 1965-03-25, 50 y.o.   MRN: [id number]  HPI  [name] is a 50 year old female who presents today to establish care and discuss the problems mentioned below. Will obtain old records.  1) Depression: Currently managed on Trazodone 150 mg for insomnia and Pristiq 100 mg. She doesn't sleep well given racing thoughts and is working with neurologist to help with insomnia. She is not currently seeing a psychiatrist. She's been managed on other medications in the past but cannot remember what they were. She denies SI/HI.  2) Chronic Pain: Back trauma at age 34, several surgeries. She had a severe accident with TBI in 2009 with lots of scar tissue to scap. 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 Currently managed on Oxy IR 5 mg, Soma 350 mg (2 tablets TID), and Mobic 15 mg. She was following with her prior PCP for pain management but is working to get established with Dr. Primus Bravo. She believes this Manuela Neptune works the best and does wish to come off of her oxycodone.  3) Seizure Disorder: She is currently managed per Neurology. Currently managed on Keppra 500 mg BID, and Lamictal 100 mg BID. Seizures are triggered by anxiety and pain. History of recent grand mal seizure per patient's friend.  4) GERD: Currently managed on Omeprazole 40 mg. She will experience projectile vomiting, esophageal burning, and epigastric pain without her medication. She must eat small frequent meals.  5) Breast Mass: Noted on mammogram to left breast in May 2017. Due for repeat mammogram and ultrasound now. She would like to go through the [facility] on [address]. She denies unexplained weight loss, breast pain, changes to her breast tissue.  Review of Systems  Constitutional: Negative for unexpected weight change.  Respiratory: Negative for shortness of breath.   Cardiovascular: Negative for chest pain.  Gastrointestinal:       GERD    Musculoskeletal: Positive for back pain.  Skin: Negative for color change.  Neurological: Positive for seizures and numbness.  Psychiatric/Behavioral: Positive for sleep disturbance. Negative for suicidal ideas. The patient is nervous/anxious.        Overall feels depression is well managed       Past Medical History:  Diagnosis Date  . Cancer (HCC)    CERVICAL  . Depression   . Falls    EASILY  . GERD (gastroesophageal reflux disease)   . Seizures ([place])   . 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 TBI (traumatic brain injury) ([place])    SKULL FX 2009  . Vertigo      Social History   Social History  . Marital status: Divorced    Spouse name: N/A  . Number of children: N/A  . Years of education: N/A   Occupational History  . Not on file.   Social History Main Topics  . Smoking status: Current Every Day Smoker    Packs/day: 1.00    Types: Cigarettes  . Smokeless tobacco: Not on file  . Alcohol use No  . Drug use:     Types: Marijuana  . Sexual activity: No   Other Topics Concern  . Not on file   Social History Narrative  . No narrative on file    Past Surgical History:  Procedure Laterality Date  . BACK SURGERY     LUMBAR FUSIONS X 2  .  CATARACT EXTRACTION W/PHACO Right 01/07/2015   Procedure: CATARACT EXTRACTION PHACO AND INTRAOCULAR LENS PLACEMENT (IOC);  Surgeon: Birder Robson, MD;  Location: [facility] ORS;  Service: Ophthalmology;  Laterality: Right;  [place] 0 AP% 0 CDE 0 fluid pack lot [id number]  . CATARACT EXTRACTION W/PHACO Left 01/28/2015   Procedure: CATARACT EXTRACTION PHACO AND INTRAOCULAR LENS PLACEMENT (IOC);  Surgeon: Birder Robson, MD;  Location: [facility] ORS;  Service: Ophthalmology;  Laterality: Left;  [place] 00:18 AP%7.1 CDE1.32 fluid pack lot # [id number]  . LEEP      Family History  Problem Relation Age of Onset  . Ovarian cancer Maternal Aunt     40's    Allergies  Allergen Reactions  . Contrast Media [Iodinated  Diagnostic Agents]   . Diphenhydramine Hcl Hives and Itching  . Adhesive [Tape]     PAPER TAPE OK  . Sulfa Antibiotics Rash    Current Outpatient Prescriptions on File Prior to Visit  Medication Sig Dispense Refill  . carisoprodol (SOMA) 350 MG tablet Take 1 to 3 by mouth times daily    . desvenlafaxine (PRISTIQ) 100 MG 24 hr tablet Take 100 mg by mouth daily.    Marland Kitchen levETIRAcetam (KEPPRA) 500 MG tablet Take 500 mg by mouth 2 (two) times daily.     . meloxicam (MOBIC) 15 MG tablet Take 1 tablet (15 mg total) by mouth daily. 30 tablet 0  . omeprazole (PRILOSEC) 40 MG capsule Take 40 mg by mouth daily.     No current facility-administered medications on file prior to visit.     BP 112/78   Pulse 68   Temp 98.4 F (36.9 C) (Oral)   Ht 5\' 7"  (1.702 m)   Wt 126 lb 12.8 oz (57.5 kg)   SpO2 95%   BMI 19.86 kg/m    Objective:   Physical Exam  Constitutional: She is oriented to person, place, and time. She appears well-nourished.  Neck: Neck supple.  Cardiovascular: Normal rate and regular rhythm.   Pulmonary/Chest: Effort normal and breath sounds normal.  Neurological: She is alert and oriented to person, place, and time.  Skin: Skin is warm and dry.  Psychiatric: She has a normal mood and affect.          Assessment & Plan:

## 2016-01-08 NOTE — Assessment & Plan Note (Signed)
Managed on omeprazole 40 mg. Experiences projectile vomiting, esophageal burning without medication. Continue current medication.

## 2016-01-08 NOTE — Progress Notes (Signed)
Pre visit review using our clinic review tool, if applicable. No additional management support is needed unless otherwise documented below in the visit note. 

## 2016-01-08 NOTE — Patient Instructions (Addendum)
I will re-order your mammogram and ultrasound for [facility].   You should be contacted by Dr. Ethel Rana office shortly.  Continue Trazodone 150 mg at bedtime.  I will be in touch regarding follow up once I've received your records.  It was a pleasure to meet you today! Please don't hesitate to call me with any questions. Welcome to [facility]!

## 2016-01-08 NOTE — Assessment & Plan Note (Addendum)
Following with neurology, managed on Keppra and Lamictal.

## 2016-01-08 NOTE — Assessment & Plan Note (Signed)
Managed on Pristiq, overall feels well managed. Does experience symptoms of anxiety. We'll continue to monitor.

## 2016-01-08 NOTE — Assessment & Plan Note (Signed)
Discovered on mammogram in May 2017. Orders placed for repeat mammogram and ultrasound for follow-up. Changed orders to [facility] imaging.

## 2016-01-13 DIAGNOSIS — R569 Unspecified convulsions: Secondary | ICD-10-CM | POA: Diagnosis not present

## 2016-01-15 ENCOUNTER — Other Ambulatory Visit: Payer: Self-pay | Admitting: Primary Care

## 2016-01-15 DIAGNOSIS — N644 Mastodynia: Secondary | ICD-10-CM

## 2016-01-16 ENCOUNTER — Ambulatory Visit
Admission: RE | Admit: 2016-01-16 | Discharge: 2016-01-16 | Disposition: A | Discharge status: Home / Self Care | Payer: Medicare Other | Source: Ambulatory Visit | Attending: Physician Assistant | Admitting: Physician Assistant

## 2016-01-16 ENCOUNTER — Ambulatory Visit
Admission: RE | Admit: 2016-01-16 | Discharge: 2016-01-16 | Disposition: A | Discharge status: Home / Self Care | Payer: Medicare Other | Source: Ambulatory Visit | Attending: Primary Care | Admitting: Primary Care

## 2016-01-16 DIAGNOSIS — R928 Other abnormal and inconclusive findings on diagnostic imaging of breast: Secondary | ICD-10-CM

## 2016-01-16 DIAGNOSIS — N6322 Unspecified lump in the left breast, upper inner quadrant: Secondary | ICD-10-CM | POA: Diagnosis not present

## 2016-01-16 DIAGNOSIS — N644 Mastodynia: Secondary | ICD-10-CM

## 2016-01-16 DIAGNOSIS — N6012 Diffuse cystic mastopathy of left breast: Secondary | ICD-10-CM | POA: Diagnosis not present

## 2016-02-01 ENCOUNTER — Telehealth: Payer: Self-pay | Admitting: Primary Care

## 2016-02-01 NOTE — Telephone Encounter (Signed)
Please notify patient that I received and reviewed her records which indicate that she's due for her annual physical with blood work now. She may schedule this at any time at her convenience.

## 2016-02-02 ENCOUNTER — Telehealth: Payer: Self-pay | Admitting: Primary Care

## 2016-02-02 DIAGNOSIS — M549 Dorsalgia, unspecified: Principal | ICD-10-CM

## 2016-02-02 DIAGNOSIS — G8929 Other chronic pain: Secondary | ICD-10-CM

## 2016-02-02 NOTE — Telephone Encounter (Signed)
Rodman Key called - [name] is wanting a referral to dr crisp please call pt back about this at [phone]

## 2016-02-02 NOTE — Telephone Encounter (Signed)
Please notify patient that a referral was placed for pain management with request for Dr. Primus Bravo.

## 2016-02-02 NOTE — Telephone Encounter (Signed)
Per DPR, left detail message for patient's Kate's comments.

## 2016-02-02 NOTE — Telephone Encounter (Signed)
Sending letter  Bascom Levels comments for patient.

## 2016-02-05 ENCOUNTER — Other Ambulatory Visit: Payer: Self-pay

## 2016-02-05 NOTE — Telephone Encounter (Addendum)
Spoke to patient, we do not participate with [place] Access Medicaid and Pain Management will not accept a referral from [place].  Explained that to the patient, she was very angry and stated she wants her pain medication.  She states that she received her pain medication from [facility] in [place] but she doesn't want to go back there and doesn't want to go to [facility].  Explained that [place] Access impacts our ability to care for her because we are unable to refer.  Gave her options of checking with her social worker or getting the list from our office of participating providers.  Patient hung up phone mid conversation.   Canceled referral.

## 2016-02-05 NOTE — Telephone Encounter (Signed)
Noted  

## 2016-03-15 DIAGNOSIS — F419 Anxiety disorder, unspecified: Secondary | ICD-10-CM | POA: Diagnosis not present

## 2016-03-15 DIAGNOSIS — E784 Other hyperlipidemia: Secondary | ICD-10-CM | POA: Diagnosis not present

## 2016-03-15 DIAGNOSIS — R079 Chest pain, unspecified: Secondary | ICD-10-CM | POA: Diagnosis not present

## 2016-03-15 DIAGNOSIS — Z0000 Encounter for general adult medical examination without abnormal findings: Secondary | ICD-10-CM | POA: Diagnosis not present

## 2016-03-15 DIAGNOSIS — E559 Vitamin D deficiency, unspecified: Secondary | ICD-10-CM | POA: Diagnosis not present

## 2016-03-15 DIAGNOSIS — F329 Major depressive disorder, single episode, unspecified: Secondary | ICD-10-CM | POA: Diagnosis not present

## 2016-03-15 DIAGNOSIS — E7800 Pure hypercholesterolemia, unspecified: Secondary | ICD-10-CM | POA: Diagnosis not present

## 2016-03-15 DIAGNOSIS — Z716 Tobacco abuse counseling: Secondary | ICD-10-CM | POA: Diagnosis not present

## 2016-03-15 DIAGNOSIS — Z79899 Other long term (current) drug therapy: Secondary | ICD-10-CM | POA: Diagnosis not present

## 2016-03-15 DIAGNOSIS — R5383 Other fatigue: Secondary | ICD-10-CM | POA: Diagnosis not present

## 2016-04-05 DIAGNOSIS — Z01419 Encounter for gynecological examination (general) (routine) without abnormal findings: Secondary | ICD-10-CM | POA: Diagnosis not present

## 2016-04-05 DIAGNOSIS — Z202 Contact with and (suspected) exposure to infections with a predominantly sexual mode of transmission: Secondary | ICD-10-CM | POA: Diagnosis not present

## 2016-04-05 DIAGNOSIS — Z124 Encounter for screening for malignant neoplasm of cervix: Secondary | ICD-10-CM | POA: Diagnosis not present

## 2016-04-05 DIAGNOSIS — Z7721 Contact with and (suspected) exposure to potentially hazardous body fluids: Secondary | ICD-10-CM | POA: Diagnosis not present

## 2016-04-05 DIAGNOSIS — M549 Dorsalgia, unspecified: Secondary | ICD-10-CM | POA: Diagnosis not present

## 2016-04-05 DIAGNOSIS — Z1151 Encounter for screening for human papillomavirus (HPV): Secondary | ICD-10-CM | POA: Diagnosis not present

## 2016-04-10 ENCOUNTER — Emergency Department: Payer: Medicare Other

## 2016-04-10 ENCOUNTER — Emergency Department
Admission: EM | Admit: 2016-04-10 | Discharge: 2016-04-10 | Disposition: A | Discharge status: Home / Self Care | Payer: Medicare Other | Attending: Emergency Medicine | Admitting: Emergency Medicine

## 2016-04-10 ENCOUNTER — Encounter: Payer: Self-pay | Admitting: Medical Oncology

## 2016-04-10 DIAGNOSIS — J181 Lobar pneumonia, unspecified organism: Secondary | ICD-10-CM | POA: Diagnosis not present

## 2016-04-10 DIAGNOSIS — J101 Influenza due to other identified influenza virus with other respiratory manifestations: Secondary | ICD-10-CM

## 2016-04-10 DIAGNOSIS — Z79899 Other long term (current) drug therapy: Secondary | ICD-10-CM | POA: Insufficient documentation

## 2016-04-10 DIAGNOSIS — Z8541 Personal history of malignant neoplasm of cervix uteri: Secondary | ICD-10-CM | POA: Diagnosis not present

## 2016-04-10 DIAGNOSIS — J09X2 Influenza due to identified novel influenza A virus with other respiratory manifestations: Secondary | ICD-10-CM | POA: Diagnosis not present

## 2016-04-10 DIAGNOSIS — J189 Pneumonia, unspecified organism: Secondary | ICD-10-CM

## 2016-04-10 DIAGNOSIS — R05 Cough: Secondary | ICD-10-CM | POA: Diagnosis not present

## 2016-04-10 DIAGNOSIS — F17210 Nicotine dependence, cigarettes, uncomplicated: Secondary | ICD-10-CM | POA: Diagnosis not present

## 2016-04-10 LAB — INFLUENZA PANEL BY PCR (TYPE A & B)
INFLAPCR: POSITIVE — AB
Influenza B By PCR: NEGATIVE

## 2016-04-10 LAB — COMPREHENSIVE METABOLIC PANEL
ALT: 24 U/L (ref 14–54)
AST: 30 U/L (ref 15–41)
Albumin: 4.7 g/dL (ref 3.5–5.0)
Alkaline Phosphatase: 90 U/L (ref 38–126)
Anion gap: 10 (ref 5–15)
BUN: 12 mg/dL (ref 6–20)
CO2: 26 mmol/L (ref 22–32)
CREATININE: 0.83 mg/dL (ref 0.44–1.00)
Calcium: 9.4 mg/dL (ref 8.9–10.3)
Chloride: 103 mmol/L (ref 101–111)
GFR calc non Af Amer: >60 mL/min (ref >60)
Glucose, Bld: 107 mg/dL — ABNORMAL HIGH (ref 65–99)
POTASSIUM: 3.8 mmol/L (ref 3.5–5.1)
SODIUM: 139 mmol/L (ref 135–145)
Total Bilirubin: 0.3 mg/dL (ref 0.3–1.2)
Total Protein: 7.7 g/dL (ref 6.5–8.1)

## 2016-04-10 LAB — CBC WITH DIFFERENTIAL/PLATELET
Basophils Absolute: 0 10*3/uL (ref 0–0.1)
Basophils Relative: 0 %
EOS ABS: 0 10*3/uL (ref 0–0.7)
Eosinophils Relative: 0 %
HEMATOCRIT: 38.8 % (ref 35.0–47.0)
HEMOGLOBIN: 13.6 g/dL (ref 12.0–16.0)
LYMPHS PCT: 5 %
Lymphs Abs: 0.4 10*3/uL — ABNORMAL LOW (ref 1.0–3.6)
MCH: 30.8 pg (ref 26.0–34.0)
MCHC: 35.1 g/dL (ref 32.0–36.0)
MCV: 87.9 fL (ref 80.0–100.0)
MONOS PCT: 8 %
Monocytes Absolute: 0.7 10*3/uL (ref 0.2–0.9)
NEUTROS PCT: 87 %
Neutro Abs: 7.3 10*3/uL — ABNORMAL HIGH (ref 1.4–6.5)
Platelets: 250 10*3/uL (ref 150–440)
RBC: 4.42 MIL/uL (ref 3.80–5.20)
RDW: 13.8 % (ref 11.5–14.5)
WBC: 8.5 10*3/uL (ref 3.6–11.0)

## 2016-04-10 LAB — URINALYSIS, COMPLETE (UACMP) WITH MICROSCOPIC
BACTERIA UA: NONE SEEN
BILIRUBIN URINE: NEGATIVE
Glucose, UA: NEGATIVE mg/dL
Hgb urine dipstick: NEGATIVE
KETONES UR: NEGATIVE mg/dL
NITRITE: NEGATIVE
PH: 7 (ref 5.0–8.0)
PROTEIN: NEGATIVE mg/dL
Specific Gravity, Urine: 1.011 (ref 1.005–1.030)

## 2016-04-10 MED ORDER — PREDNISONE 10 MG PO TABS: 50.0000 mg | ORAL_TABLET | Freq: Every day | ORAL | 0 refills | Status: DC

## 2016-04-10 MED ORDER — HYDROCOD POLST-CPM POLST ER 10-8 MG/5ML PO SUER
5.0000 mL | Freq: Once | ORAL | Status: AC
  Administered 2016-04-10: 5 mL via ORAL
  Filled 2016-04-10: qty 5

## 2016-04-10 MED ORDER — IBUPROFEN 600 MG PO TABS
600.0000 mg | ORAL_TABLET | Freq: Once | ORAL | Status: AC
Start: 2016-04-10 — End: 2016-04-10
  Administered 2016-04-10: 600 mg via ORAL
  Filled 2016-04-10: qty 1

## 2016-04-10 MED ORDER — AZITHROMYCIN 250 MG PO TABS: ORAL_TABLET | ORAL | 0 refills | Status: DC

## 2016-04-10 MED ORDER — SODIUM CHLORIDE 0.9 % IV BOLUS (SEPSIS)
1000.0000 mL | Freq: Once | INTRAVENOUS | Status: AC
  Administered 2016-04-10: 1000 mL via INTRAVENOUS

## 2016-04-10 MED ORDER — PROMETHAZINE-PHENYLEPHRINE 6.25-5 MG/5ML PO SYRP: 5.0000 mL | ORAL_SOLUTION | ORAL | 0 refills | Status: DC | PRN

## 2016-04-10 NOTE — ED Triage Notes (Signed)
Fever, cough, body aches that began 3-4 days ago.

## 2016-04-10 NOTE — ED Provider Notes (Signed)
[facility] Emergency Department Provider Note  ____________________________________________   First MD Initiated Contact with Patient 04/10/16 1015     (approximate)  I have reviewed the triage vital signs and the nursing notes.   HISTORY  Chief Complaint Fever; Cough; and Generalized Body Aches   HPI [name] is a 51 y.o. female who presents to the emergency department for evaluation of fever, body aches, and cough for the past 3-4 days. No relief with OTC medications.    Past Medical History:  Diagnosis Date  . Cancer (HCC)    CERVICAL  . Depression   . Falls    EASILY  . GERD (gastroesophageal reflux disease)   . Seizures ([place])   . 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 TBI (traumatic brain injury) ([place])    SKULL FX 2009  . Vertigo     Patient Active Problem List   Diagnosis Date Noted  . Breast mass, left 01/08/2016  . Anxiety and depression 01/08/2016  . Chronic pain 01/08/2016  . Seizure disorder ([place]) 01/08/2016  . GERD (gastroesophageal reflux disease) 01/08/2016    Past Surgical History:  Procedure Laterality Date  . BACK SURGERY     LUMBAR FUSIONS X 2  . CATARACT EXTRACTION W/PHACO Right 01/07/2015   Procedure: CATARACT EXTRACTION PHACO AND INTRAOCULAR LENS PLACEMENT (IOC);  Surgeon: Birder Robson, MD;  Location: [facility] ORS;  Service: Ophthalmology;  Laterality: Right;  [place] 0 AP% 0 CDE 0 fluid pack lot [id number]  . CATARACT EXTRACTION W/PHACO Left 01/28/2015   Procedure: CATARACT EXTRACTION PHACO AND INTRAOCULAR LENS PLACEMENT (IOC);  Surgeon: Birder Robson, MD;  Location: [facility] ORS;  Service: Ophthalmology;  Laterality: Left;  [place] 00:18 AP%7.1 CDE1.32 fluid pack lot # [id number]  . LEEP      Prior to Admission medications   Medication Sig Start Date End Date Taking? Authorizing Provider  azithromycin (ZITHROMAX) 250 MG tablet 2 tablets today, then 1 tablet for the next 4 days. 04/10/16   Victorino Dike, FNP  carisoprodol (SOMA) 350 MG tablet Take 1 to 3 by mouth times daily    Historical Provider, MD  desvenlafaxine (PRISTIQ) 100 MG 24 hr tablet Take 100 mg by mouth daily.    Historical Provider, MD  fexofenadine (ALLEGRA) 180 MG tablet Take 180 mg by mouth daily.     Historical Provider, MD  lamoTRIgine (LAMICTAL) 100 MG tablet Take 100 mg by mouth 2 (two) times daily.  01/07/16   Historical Provider, MD  levETIRAcetam (KEPPRA) 500 MG tablet Take 500 mg by mouth 2 (two) times daily.     Historical Provider, MD  meloxicam (MOBIC) 15 MG tablet Take 1 tablet (15 mg total) by mouth daily. 01/03/15   Charline Bills Cuthriell, PA-C  omeprazole (PRILOSEC) 40 MG capsule Take 40 mg by mouth daily.    Historical Provider, MD  oxyCODONE (OXY IR/ROXICODONE) 5 MG immediate release tablet Take 5 mg by mouth 2 (two) times daily.     Historical Provider, MD  predniSONE (DELTASONE) 10 MG tablet Take 5 tablets (50 mg total) by mouth daily. 04/10/16   Victorino Dike, FNP  promethazine-phenylephrine (PROMETHAZINE VC) 6.25-5 MG/5ML SYRP Take 5 mLs by mouth every 4 (four) hours as needed for congestion. 04/10/16   Victorino Dike, FNP  traZODone (DESYREL) 50 MG tablet Take 150 mg by mouth at bedtime.  01/07/16   Historical Provider, MD    Allergies Contrast media [iodinated diagnostic  agents]; Diphenhydramine hcl; Adhesive [tape]; and Sulfa antibiotics  Family History  Problem Relation Age of Onset  . Ovarian cancer Maternal Aunt     40's    Social History Social History  Substance Use Topics  . Smoking status: Current Every Day Smoker    Packs/day: 1.00    Types: Cigarettes  . Smokeless tobacco: Not on file  . Alcohol use No    Review of Systems Constitutional: Positive for weakness and fever ENT: No sore throat. Cardiovascular: Denies chest pain. Respiratory: Denies shortness of breath. Positive for cough Gastrointestinal: No abdominal pain. Positive for nausea and vomiting. No  diarrhea. Genitourinary: Negative for dysuria. Skin: Negative for rash. Neurological: Negative for headaches, focal weakness or numbness. ___________________________________________   PHYSICAL EXAM:  VITAL SIGNS: ED Triage Vitals  Enc Vitals Group     BP 04/10/16 0924 (!) 141/85     Pulse Rate 04/10/16 0924 82     Resp 04/10/16 0924 18     Temp 04/10/16 0924 99.3 F (37.4 C)     Temp Source 04/10/16 0924 Oral     SpO2 04/10/16 0924 96 %     Weight 04/10/16 0922 116 lb (52.6 kg)     Height 04/10/16 0922 5\' 8"  (1.727 m)     Head Circumference --      Peak Flow --      Pain Score 04/10/16 0922 8     Pain Loc --      Pain Edu? --      Excl. in [place]? --     Constitutional: Alert and oriented. Well appearing and in no acute distress. Eyes: Conjunctivae are normal.  EOMI. Head: Atraumatic. Nose: No congestion/rhinnorhea. Mouth/Throat: Mucous membranes are moist.  Oropharynx non-erythematous. Neck: No stridor.   Cardiovascular: Normal rate, regular rhythm. Grossly normal heart sounds.  Good peripheral circulation. Respiratory: Normal respiratory effort.  No retractions. Diminished breath sounds with faint expiratory wheezing on the left. Musculoskeletal: No lower extremity tenderness nor edema.  No joint effusions. Neurologic:  Normal speech and language. No gross focal neurologic deficits are appreciated. No gait instability. Skin:  Skin is warm, dry and intact. No rash noted. Poor skin turgor. Psychiatric: Mood and affect are normal. Speech and behavior are normal.  ____________________________________________   LABS (all labs ordered are listed, but only abnormal results are displayed)  Labs Reviewed  COMPREHENSIVE METABOLIC PANEL - Abnormal; Notable for the following:       Result Value   Glucose, Bld 107 (*)    All other components within normal limits  CBC WITH DIFFERENTIAL/PLATELET - Abnormal; Notable for the following:    Neutro Abs 7.3 (*)    Lymphs Abs 0.4 (*)     All other components within normal limits  INFLUENZA PANEL BY PCR (TYPE A & B) - Abnormal; Notable for the following:    Influenza A By PCR POSITIVE (*)    All other components within normal limits  URINALYSIS, COMPLETE (UACMP) WITH MICROSCOPIC - Abnormal; Notable for the following:    Color, Urine YELLOW (*)    APPearance CLEAR (*)    Leukocytes, UA TRACE (*)    Squamous Epithelial / LPF 0-5 (*)    All other components within normal limits   ____________________________________________  EKG  Not indicated. ____________________________________________  RADIOLOGY  IMPRESSION:  Small hazy area of airspace disease in the left lower lobe  concerning for pneumonia.    ____________________________________________   PROCEDURES  Procedure(s) performed: None  Procedures  Critical  Care performed: No  ____________________________________________   INITIAL IMPRESSION / ASSESSMENT AND PLAN / ED COURSE  Pertinent labs & imaging results that were available during my care of the patient were reviewed by me and considered in my medical decision making (see chart for details).  51 year old female presenting to the emergency department for treatment of persistent cough, nausea, vomiting, and fever. She has a history of seizures with her last being Thursday. Nothing unusual about that seizure and she has not missed any doses of her medications. She has been unable to tolerate food or fluids for the past 2 days. Plan to do labs, chest xray and rehydrate her while in the ER then discharge her home if all appears well.   ----------------------------------------- 12:52 PM on 04/10/2016 -----------------------------------------  Patient to be discharged home with prescriptions for azithromycin, prednisone, and Phenergan VC for cough. She is to follow-up with her primary care provider next week for recheck. She is instructed to return to the emergency department for symptoms that change  or worsen if she is unable to schedule an appointment.  ____________________________________________   FINAL CLINICAL IMPRESSION(S) / ED DIAGNOSES  Final diagnoses:  Influenza A  Community acquired pneumonia of left lower lobe of lung ([place])      NEW MEDICATIONS STARTED DURING THIS VISIT:  New Prescriptions   AZITHROMYCIN (ZITHROMAX) 250 MG TABLET    2 tablets today, then 1 tablet for the next 4 days.   PREDNISONE (DELTASONE) 10 MG TABLET    Take 5 tablets (50 mg total) by mouth daily.   PROMETHAZINE-PHENYLEPHRINE (PROMETHAZINE VC) 6.25-5 MG/5ML SYRP    Take 5 mLs by mouth every 4 (four) hours as needed for congestion.     Note:  This document was prepared using Dragon voice recognition software and may include unintentional dictation errors.    Victorino Dike, FNP 04/10/16 [place], MD 04/10/16 1414

## 2016-04-10 NOTE — Discharge Instructions (Signed)
Follow up with your primary care provider next week. Return to the ER for symptoms that change or worsen or for new concerns if you are unable to schedule an appointment.

## 2016-04-10 NOTE — ED Notes (Signed)
Pt has hx of seizures. Pt had a seizure Thursday night. Per pt's husband the Pt's normal body temp is around 96. With her temp at 99 there is a concern for seizure activity.

## 2016-04-11 ENCOUNTER — Emergency Department
Admission: EM | Admit: 2016-04-11 | Discharge: 2016-04-11 | Disposition: A | Discharge status: Home / Self Care | Payer: Medicare Other | Attending: Emergency Medicine | Admitting: Emergency Medicine

## 2016-04-11 ENCOUNTER — Encounter: Payer: Self-pay | Admitting: Emergency Medicine

## 2016-04-11 DIAGNOSIS — J189 Pneumonia, unspecified organism: Secondary | ICD-10-CM | POA: Insufficient documentation

## 2016-04-11 DIAGNOSIS — Z79899 Other long term (current) drug therapy: Secondary | ICD-10-CM | POA: Insufficient documentation

## 2016-04-11 DIAGNOSIS — R5383 Other fatigue: Secondary | ICD-10-CM | POA: Diagnosis present

## 2016-04-11 DIAGNOSIS — J111 Influenza due to unidentified influenza virus with other respiratory manifestations: Secondary | ICD-10-CM

## 2016-04-11 DIAGNOSIS — F17210 Nicotine dependence, cigarettes, uncomplicated: Secondary | ICD-10-CM | POA: Insufficient documentation

## 2016-04-11 DIAGNOSIS — Z8541 Personal history of malignant neoplasm of cervix uteri: Secondary | ICD-10-CM | POA: Insufficient documentation

## 2016-04-11 DIAGNOSIS — Z0389 Encounter for observation for other suspected diseases and conditions ruled out: Secondary | ICD-10-CM | POA: Diagnosis not present

## 2016-04-11 LAB — URINALYSIS, COMPLETE (UACMP) WITH MICROSCOPIC
BACTERIA UA: NONE SEEN
BILIRUBIN URINE: NEGATIVE
Glucose, UA: NEGATIVE mg/dL
KETONES UR: NEGATIVE mg/dL
LEUKOCYTES UA: NEGATIVE
Nitrite: NEGATIVE
Protein, ur: >=300 mg/dL — AB
Specific Gravity, Urine: 1.03 (ref 1.005–1.030)
pH: 5 (ref 5.0–8.0)

## 2016-04-11 LAB — BASIC METABOLIC PANEL
ANION GAP: 11 (ref 5–15)
BUN: 18 mg/dL (ref 6–20)
CHLORIDE: 98 mmol/L — AB (ref 101–111)
CO2: 25 mmol/L (ref 22–32)
CREATININE: 0.72 mg/dL (ref 0.44–1.00)
Calcium: 9.7 mg/dL (ref 8.9–10.3)
GFR calc non Af Amer: >60 mL/min (ref >60)
Glucose, Bld: 134 mg/dL — ABNORMAL HIGH (ref 65–99)
POTASSIUM: 3.5 mmol/L (ref 3.5–5.1)
Sodium: 134 mmol/L — ABNORMAL LOW (ref 135–145)

## 2016-04-11 LAB — CBC
HEMATOCRIT: 41.1 % (ref 35.0–47.0)
HEMOGLOBIN: 14.7 g/dL (ref 12.0–16.0)
MCH: 30.9 pg (ref 26.0–34.0)
MCHC: 35.7 g/dL (ref 32.0–36.0)
MCV: 86.7 fL (ref 80.0–100.0)
Platelets: 228 10*3/uL (ref 150–440)
RBC: 4.74 MIL/uL (ref 3.80–5.20)
RDW: 13.8 % (ref 11.5–14.5)
WBC: 9.5 10*3/uL (ref 3.6–11.0)

## 2016-04-11 MED ORDER — ONDANSETRON HCL 4 MG PO TABS: 4.0000 mg | ORAL_TABLET | Freq: Three times a day (TID) | ORAL | 0 refills | Status: DC | PRN

## 2016-04-11 MED ORDER — PROCHLORPERAZINE EDISYLATE 5 MG/ML IJ SOLN
INTRAMUSCULAR | Status: AC
Start: 2016-04-11 — End: 2016-04-11
  Administered 2016-04-11: 10 mg via INTRAVENOUS
  Filled 2016-04-11: qty 2

## 2016-04-11 MED ORDER — PROCHLORPERAZINE EDISYLATE 5 MG/ML IJ SOLN
10.0000 mg | Freq: Once | INTRAMUSCULAR | Status: AC
  Administered 2016-04-11: 10 mg via INTRAVENOUS
  Filled 2016-04-11: qty 2

## 2016-04-11 MED ORDER — SODIUM CHLORIDE 0.9 % IV BOLUS (SEPSIS)
1000.0000 mL | Freq: Once | INTRAVENOUS | Status: AC
  Administered 2016-04-11: 1000 mL via INTRAVENOUS

## 2016-04-11 MED ORDER — BENZONATATE 100 MG PO CAPS: 100.0000 mg | ORAL_CAPSULE | Freq: Three times a day (TID) | ORAL | 0 refills | Status: AC | PRN

## 2016-04-11 NOTE — ED Triage Notes (Addendum)
Pt comes into the ED via POV c/o generalized fatigue, dehydration, and fever.  Patient was diagnosed with flu yesterday (type A) and pneumonia and has been unable to get the medication started due to complications with the pharmacy.  Patient states she has had increased fatigue, dehydration, and cant get her fever under control.  Patient has had decreased oral intake for the past couple of days.

## 2016-04-11 NOTE — ED Notes (Signed)
ED Provider at bedside. 

## 2016-04-11 NOTE — Discharge Instructions (Signed)
Please seek medical attention for any high fevers, chest pain, shortness of breath, change in behavior, persistent vomiting, bloody stool or any other new or concerning symptoms.  

## 2016-04-11 NOTE — ED Notes (Signed)
Pt states that she can't take pills due to throat being sore.

## 2016-04-11 NOTE — ED Notes (Signed)
Generalized fatigue, weakness, body aches. PT dx with flu.

## 2016-04-11 NOTE — ED Notes (Signed)
Ambulated to BR.

## 2016-04-11 NOTE — ED Notes (Signed)
AAOx3.  Skin warm and dry.  NAD 

## 2016-04-11 NOTE — ED Provider Notes (Signed)
[facility] Emergency Department Provider Note    ____________________________________________   I have reviewed the triage vital signs and the nursing notes.   HISTORY  Chief Complaint Fatigue and Dehydration   History limited by: Not Limited   HPI [name] is a 51 y.o. female who presents to the emergency department today because of continued symptoms of cough, nausea and vomiting, fevers. The patient was seen in the emergency department yesterday and diagnosed with influenza and pneumonia. Patient has not started any of the medications prescribed to her. She continues to have her symptoms. The patient states that she cannot sleep last night. There is some concern for dehydration given the amount of diaphoresis she had overnight. They do state that the cough has improved somewhat. Patient has not tried any Tylenol today she did not feel up to it.  Past Medical History:  Diagnosis Date  . Cancer (HCC)    CERVICAL  . Depression   . Falls    EASILY  . GERD (gastroesophageal reflux disease)   . Seizures ([place])   . 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 Vertigo     Patient Active Problem List   Diagnosis Date Noted  . Breast mass, left 01/08/2016  . Anxiety and depression 01/08/2016  . Chronic pain 01/08/2016  . Seizure disorder ([place]) 01/08/2016  . GERD (gastroesophageal reflux disease) 01/08/2016    Past Surgical History:  Procedure Laterality Date  . BACK SURGERY     LUMBAR FUSIONS X 2  . CATARACT EXTRACTION W/PHACO Right 01/07/2015   Procedure: CATARACT EXTRACTION PHACO AND INTRAOCULAR LENS PLACEMENT (IOC);  Surgeon: Birder Robson, MD;  Location: [facility] ORS;  Service: Ophthalmology;  Laterality: Right;  [place] 0 AP% 0 CDE 0 fluid pack lot [id number]  . CATARACT EXTRACTION W/PHACO Left 01/28/2015   Procedure: CATARACT EXTRACTION PHACO AND INTRAOCULAR LENS PLACEMENT  (IOC);  Surgeon: Birder Robson, MD;  Location: [facility] ORS;  Service: Ophthalmology;  Laterality: Left;  [place] 00:18 AP%7.1 CDE1.32 fluid pack lot # [id number]  . LEEP      Prior to Admission medications   Medication Sig Start Date End Date Taking? Authorizing Provider  azithromycin (ZITHROMAX) 250 MG tablet 2 tablets today, then 1 tablet for the next 4 days. 04/10/16   Victorino Dike, FNP  carisoprodol (SOMA) 350 MG tablet Take 1 to 3 by mouth times daily    Historical Provider, MD  desvenlafaxine (PRISTIQ) 100 MG 24 hr tablet Take 100 mg by mouth daily.    Historical Provider, MD  fexofenadine (ALLEGRA) 180 MG tablet Take 180 mg by mouth daily.     Historical Provider, MD  lamoTRIgine (LAMICTAL) 100 MG tablet Take 100 mg by mouth 2 (two) times daily.  01/07/16   Historical Provider, MD  levETIRAcetam (KEPPRA) 500 MG tablet Take 500 mg by mouth 2 (two) times daily.     Historical Provider, MD  meloxicam (MOBIC) 15 MG tablet Take 1 tablet (15 mg total) by mouth daily. 01/03/15   Charline Bills Cuthriell, PA-C  omeprazole (PRILOSEC) 40 MG capsule Take 40 mg by mouth daily.    Historical Provider, MD  oxyCODONE (OXY IR/ROXICODONE) 5 MG immediate release tablet Take 5 mg by mouth 2 (two) times daily.     Historical Provider, MD  predniSONE (DELTASONE) 10 MG tablet Take 5 tablets (50 mg total) by mouth daily. 04/10/16  Victorino Dike, FNP  promethazine-phenylephrine (PROMETHAZINE VC) 6.25-5 MG/5ML SYRP Take 5 mLs by mouth every 4 (four) hours as needed for congestion. 04/10/16   Victorino Dike, FNP  traZODone (DESYREL) 50 MG tablet Take 150 mg by mouth at bedtime.  01/07/16   Historical Provider, MD    Allergies Contrast media [iodinated diagnostic agents]; Diphenhydramine hcl; Adhesive [tape]; and Sulfa antibiotics  Family History  Problem Relation Age of Onset  . Ovarian cancer Maternal Aunt     40's    Social History Social History  Substance Use Topics  . Smoking status: Current Every Day  Smoker    Packs/day: 1.00    Types: Cigarettes  . Smokeless tobacco: Never Used  . Alcohol use No    Review of Systems  Constitutional: Positive for fever. Cardiovascular: Negative for chest pain. Respiratory: Positive for cough. Gastrointestinal: Negative for abdominal pain, vomiting and diarrhea. Neurological: Negative for headaches, focal weakness or numbness.  10-point ROS otherwise negative.  ____________________________________________   PHYSICAL EXAM:  VITAL SIGNS: ED Triage Vitals  Enc Vitals Group     BP 04/11/16 1305 (!) 143/95     Pulse Rate 04/11/16 1305 100     Resp 04/11/16 1305 18     Temp 04/11/16 1305 (!) 101.7 F (38.7 C)     Temp Source 04/11/16 1305 Oral     SpO2 04/11/16 1305 96 %     Weight 04/11/16 1306 116 lb (52.6 kg)     Height 04/11/16 1306 5\' 8"  (1.727 m)     Head Circumference --      Peak Flow --      Pain Score 04/11/16 1311 9    Constitutional: Alert and oriented. Well appearing and in no distress. Eyes: Conjunctivae are normal. Normal extraocular movements. ENT   Head: Normocephalic and atraumatic.   Nose: No congestion/rhinnorhea.   Mouth/Throat: Mucous membranes are moist.   Neck: No stridor. Hematological/Lymphatic/Immunilogical: No cervical lymphadenopathy. Cardiovascular: Normal rate, regular rhythm.  No murmurs, rubs, or gallops.  Respiratory: Normal respiratory effort without tachypnea nor retractions. Breath sounds are clear and equal bilaterally. No wheezes/rales/rhonchi. Gastrointestinal: Soft and non tender. No rebound. No guarding.  Genitourinary: Deferred Musculoskeletal: Normal range of motion in all extremities. No lower extremity edema. Neurologic:  Normal speech and language. No gross focal neurologic deficits are appreciated.  Skin:  Skin is warm, dry and intact. No rash noted. Psychiatric: Mood and affect are normal. Speech and behavior are normal. Patient exhibits appropriate insight and  judgment.  ____________________________________________    LABS (pertinent positives/negatives)  Labs Reviewed  BASIC METABOLIC PANEL - Abnormal; Notable for the following:       Result Value   Sodium 134 (*)    Chloride 98 (*)    Glucose, Bld 134 (*)    All other components within normal limits  URINALYSIS, COMPLETE (UACMP) WITH MICROSCOPIC - Abnormal; Notable for the following:    Color, Urine YELLOW (*)    APPearance CLEAR (*)    Hgb urine dipstick MODERATE (*)    Protein, ur >=300 (*)    Squamous Epithelial / LPF 0-5 (*)    All other components within normal limits  CBC     ____________________________________________   EKG  I, Nance Pear, attending physician, personally viewed and interpreted this EKG  EKG Time: 1321 Rate: 94 Rhythm: normal sinus rhythm Axis: normal Intervals: qtc 447 QRS: narrow ST changes: no st elevation Impression: bilateral enlargement   ____________________________________________    RADIOLOGY  None  ____________________________________________   PROCEDURES  Procedures  ____________________________________________   INITIAL IMPRESSION / ASSESSMENT AND PLAN / ED COURSE  Pertinent labs & imaging results that were available during my care of the patient were reviewed by me and considered in my medical decision making (see chart for details).  Patient presents to the emergency department today with continued symptoms after being diagnosed with influenza pneumonia yesterday. Patient has not started any of the medications prescribed to her. Patient was given fluids in the emergency department. I discussed with patient importance of started prescribed medication. Will plan on discharging to follow up with PCP.  ____________________________________________   FINAL CLINICAL IMPRESSION(S) / ED DIAGNOSES  Final diagnoses:  Influenza  Community acquired pneumonia, unspecified laterality     Note: This dictation was  prepared with Diplomatic Services operational officer dictation. Any transcriptional errors that result from this process are unintentional     Nance Pear, MD 04/11/16 1721

## 2016-04-16 DIAGNOSIS — J111 Influenza due to unidentified influenza virus with other respiratory manifestations: Secondary | ICD-10-CM | POA: Diagnosis not present

## 2016-04-16 DIAGNOSIS — R0602 Shortness of breath: Secondary | ICD-10-CM | POA: Diagnosis not present

## 2016-04-16 DIAGNOSIS — Z79899 Other long term (current) drug therapy: Secondary | ICD-10-CM | POA: Diagnosis not present

## 2016-04-16 DIAGNOSIS — J449 Chronic obstructive pulmonary disease, unspecified: Secondary | ICD-10-CM | POA: Diagnosis not present

## 2016-04-16 DIAGNOSIS — R062 Wheezing: Secondary | ICD-10-CM | POA: Diagnosis not present

## 2016-04-16 DIAGNOSIS — J189 Pneumonia, unspecified organism: Secondary | ICD-10-CM | POA: Diagnosis not present

## 2016-05-05 DIAGNOSIS — F4310 Post-traumatic stress disorder, unspecified: Secondary | ICD-10-CM | POA: Diagnosis not present

## 2016-05-05 DIAGNOSIS — R569 Unspecified convulsions: Secondary | ICD-10-CM | POA: Diagnosis not present

## 2016-05-05 DIAGNOSIS — F39 Unspecified mood [affective] disorder: Secondary | ICD-10-CM | POA: Diagnosis not present

## 2016-05-05 DIAGNOSIS — G479 Sleep disorder, unspecified: Secondary | ICD-10-CM | POA: Diagnosis not present

## 2016-05-14 DIAGNOSIS — Z8249 Family history of ischemic heart disease and other diseases of the circulatory system: Secondary | ICD-10-CM | POA: Diagnosis not present

## 2016-05-14 DIAGNOSIS — T7840XA Allergy, unspecified, initial encounter: Secondary | ICD-10-CM | POA: Diagnosis not present

## 2016-05-14 DIAGNOSIS — F17210 Nicotine dependence, cigarettes, uncomplicated: Secondary | ICD-10-CM | POA: Diagnosis not present

## 2016-05-14 DIAGNOSIS — S062X9D Diffuse traumatic brain injury with loss of consciousness of unspecified duration, subsequent encounter: Secondary | ICD-10-CM | POA: Diagnosis not present

## 2016-05-14 DIAGNOSIS — G40909 Epilepsy, unspecified, not intractable, without status epilepticus: Secondary | ICD-10-CM | POA: Diagnosis not present

## 2016-05-14 DIAGNOSIS — Z716 Tobacco abuse counseling: Secondary | ICD-10-CM | POA: Diagnosis not present

## 2016-05-14 DIAGNOSIS — K219 Gastro-esophageal reflux disease without esophagitis: Secondary | ICD-10-CM | POA: Diagnosis not present

## 2016-05-14 DIAGNOSIS — J449 Chronic obstructive pulmonary disease, unspecified: Secondary | ICD-10-CM | POA: Diagnosis not present

## 2016-05-18 DIAGNOSIS — F4310 Post-traumatic stress disorder, unspecified: Secondary | ICD-10-CM | POA: Insufficient documentation

## 2016-06-14 DIAGNOSIS — E7800 Pure hypercholesterolemia, unspecified: Secondary | ICD-10-CM | POA: Diagnosis not present

## 2016-06-14 DIAGNOSIS — E559 Vitamin D deficiency, unspecified: Secondary | ICD-10-CM | POA: Diagnosis not present

## 2016-06-14 DIAGNOSIS — S062X9D Diffuse traumatic brain injury with loss of consciousness of unspecified duration, subsequent encounter: Secondary | ICD-10-CM | POA: Diagnosis not present

## 2016-06-14 DIAGNOSIS — T7840XA Allergy, unspecified, initial encounter: Secondary | ICD-10-CM | POA: Diagnosis not present

## 2016-06-14 DIAGNOSIS — R7303 Prediabetes: Secondary | ICD-10-CM | POA: Diagnosis not present

## 2016-06-14 DIAGNOSIS — F17210 Nicotine dependence, cigarettes, uncomplicated: Secondary | ICD-10-CM | POA: Diagnosis not present

## 2016-06-14 DIAGNOSIS — K219 Gastro-esophageal reflux disease without esophagitis: Secondary | ICD-10-CM | POA: Diagnosis not present

## 2016-07-07 ENCOUNTER — Encounter: Payer: Self-pay | Admitting: Emergency Medicine

## 2016-07-07 DIAGNOSIS — Z5321 Procedure and treatment not carried out due to patient leaving prior to being seen by health care provider: Secondary | ICD-10-CM | POA: Insufficient documentation

## 2016-07-07 DIAGNOSIS — Y999 Unspecified external cause status: Secondary | ICD-10-CM | POA: Insufficient documentation

## 2016-07-07 DIAGNOSIS — W228XXA Striking against or struck by other objects, initial encounter: Secondary | ICD-10-CM | POA: Diagnosis not present

## 2016-07-07 DIAGNOSIS — Y929 Unspecified place or not applicable: Secondary | ICD-10-CM | POA: Insufficient documentation

## 2016-07-07 DIAGNOSIS — Y939 Activity, unspecified: Secondary | ICD-10-CM | POA: Diagnosis not present

## 2016-07-07 DIAGNOSIS — R51 Headache: Secondary | ICD-10-CM | POA: Insufficient documentation

## 2016-07-07 LAB — COMPREHENSIVE METABOLIC PANEL
ALBUMIN: 4.5 g/dL (ref 3.5–5.0)
ALK PHOS: 81 U/L (ref 38–126)
ALT: 12 U/L — AB (ref 14–54)
AST: 26 U/L (ref 15–41)
Anion gap: 8 (ref 5–15)
BUN: 15 mg/dL (ref 6–20)
CALCIUM: 9.5 mg/dL (ref 8.9–10.3)
CHLORIDE: 102 mmol/L (ref 101–111)
CO2: 28 mmol/L (ref 22–32)
Creatinine, Ser: 0.73 mg/dL (ref 0.44–1.00)
GFR calc non Af Amer: >60 mL/min (ref >60)
Glucose, Bld: 116 mg/dL — ABNORMAL HIGH (ref 65–99)
Potassium: 3.4 mmol/L — ABNORMAL LOW (ref 3.5–5.1)
SODIUM: 138 mmol/L (ref 135–145)
TOTAL PROTEIN: 7.5 g/dL (ref 6.5–8.1)
Total Bilirubin: 0.6 mg/dL (ref 0.3–1.2)

## 2016-07-07 LAB — CBC
HCT: 40.1 % (ref 35.0–47.0)
HEMOGLOBIN: 13.8 g/dL (ref 12.0–16.0)
MCH: 31 pg (ref 26.0–34.0)
MCHC: 34.5 g/dL (ref 32.0–36.0)
MCV: 89.8 fL (ref 80.0–100.0)
Platelets: 270 10*3/uL (ref 150–440)
RBC: 4.46 MIL/uL (ref 3.80–5.20)
RDW: 14 % (ref 11.5–14.5)
WBC: 10.9 10*3/uL (ref 3.6–11.0)

## 2016-07-07 LAB — LIPASE, BLOOD: Lipase: 19 U/L (ref 11–51)

## 2016-07-07 MED ORDER — BUTALBITAL-APAP-CAFFEINE 50-325-40 MG PO TABS
1.0000 | ORAL_TABLET | Freq: Once | ORAL | Status: AC
  Administered 2016-07-07: 1 via ORAL
  Filled 2016-07-07: qty 1

## 2016-07-07 NOTE — ED Triage Notes (Signed)
Pt presents to ED with c/o headache, nausea, vomiting, and photosensitivity x 1 week. Pt reports hit her head on a bird feeder x 1 week ago and has been complaining of symptoms since then. Pt reports had traumatic brain injury x 9 years. Spouse reports pt has slurred speech d/t TBI. Pt alert and oriented x 4, respirations even and unlabored.

## 2016-07-08 ENCOUNTER — Telehealth: Payer: Self-pay | Admitting: Emergency Medicine

## 2016-07-08 ENCOUNTER — Emergency Department
Admission: EM | Admit: 2016-07-08 | Discharge: 2016-07-08 | Disposition: A | Discharge status: Home / Self Care | Payer: Medicare Other | Attending: Emergency Medicine | Admitting: Emergency Medicine

## 2016-07-08 DIAGNOSIS — R42 Dizziness and giddiness: Secondary | ICD-10-CM | POA: Diagnosis not present

## 2016-07-08 DIAGNOSIS — R6884 Jaw pain: Secondary | ICD-10-CM | POA: Diagnosis not present

## 2016-07-08 DIAGNOSIS — S0993XA Unspecified injury of face, initial encounter: Secondary | ICD-10-CM | POA: Diagnosis not present

## 2016-07-08 DIAGNOSIS — Z79899 Other long term (current) drug therapy: Secondary | ICD-10-CM | POA: Diagnosis not present

## 2016-07-08 DIAGNOSIS — S0990XA Unspecified injury of head, initial encounter: Secondary | ICD-10-CM | POA: Diagnosis not present

## 2016-07-08 DIAGNOSIS — R112 Nausea with vomiting, unspecified: Secondary | ICD-10-CM | POA: Diagnosis not present

## 2016-07-08 DIAGNOSIS — R110 Nausea: Secondary | ICD-10-CM | POA: Diagnosis not present

## 2016-07-08 DIAGNOSIS — R51 Headache: Secondary | ICD-10-CM | POA: Diagnosis not present

## 2016-07-08 DIAGNOSIS — Z87820 Personal history of traumatic brain injury: Secondary | ICD-10-CM | POA: Diagnosis not present

## 2016-07-08 DIAGNOSIS — H538 Other visual disturbances: Secondary | ICD-10-CM | POA: Diagnosis not present

## 2016-07-08 DIAGNOSIS — M6281 Muscle weakness (generalized): Secondary | ICD-10-CM | POA: Diagnosis not present

## 2016-07-08 NOTE — Telephone Encounter (Signed)
Called patient due to lwot to inquire about condition and follow up plans. Says she does plan to follow up..  Explained lab work is here for her doctor to review.

## 2016-07-13 DIAGNOSIS — Z79899 Other long term (current) drug therapy: Secondary | ICD-10-CM | POA: Diagnosis not present

## 2016-07-13 DIAGNOSIS — R784 Finding of other drugs of addictive potential in blood: Secondary | ICD-10-CM | POA: Diagnosis not present

## 2016-07-13 DIAGNOSIS — F4310 Post-traumatic stress disorder, unspecified: Secondary | ICD-10-CM | POA: Diagnosis not present

## 2016-07-13 DIAGNOSIS — Z634 Disappearance and death of family member: Secondary | ICD-10-CM | POA: Diagnosis not present

## 2016-07-13 DIAGNOSIS — F329 Major depressive disorder, single episode, unspecified: Secondary | ICD-10-CM | POA: Diagnosis not present

## 2016-08-05 DIAGNOSIS — R784 Finding of other drugs of addictive potential in blood: Secondary | ICD-10-CM | POA: Diagnosis not present

## 2016-08-05 DIAGNOSIS — S062X9D Diffuse traumatic brain injury with loss of consciousness of unspecified duration, subsequent encounter: Secondary | ICD-10-CM | POA: Diagnosis not present

## 2016-08-05 DIAGNOSIS — G988 Other disorders of nervous system: Secondary | ICD-10-CM | POA: Diagnosis not present

## 2016-08-05 DIAGNOSIS — F329 Major depressive disorder, single episode, unspecified: Secondary | ICD-10-CM | POA: Diagnosis not present

## 2016-08-05 DIAGNOSIS — K219 Gastro-esophageal reflux disease without esophagitis: Secondary | ICD-10-CM | POA: Diagnosis not present

## 2016-08-05 DIAGNOSIS — Z79899 Other long term (current) drug therapy: Secondary | ICD-10-CM | POA: Diagnosis not present

## 2016-08-23 DIAGNOSIS — M791 Myalgia: Secondary | ICD-10-CM | POA: Diagnosis not present

## 2016-08-26 DIAGNOSIS — R140 Abdominal distension (gaseous): Secondary | ICD-10-CM | POA: Diagnosis not present

## 2016-08-26 DIAGNOSIS — R109 Unspecified abdominal pain: Secondary | ICD-10-CM | POA: Diagnosis not present

## 2016-08-26 DIAGNOSIS — R5383 Other fatigue: Secondary | ICD-10-CM | POA: Diagnosis not present

## 2016-08-26 DIAGNOSIS — R112 Nausea with vomiting, unspecified: Secondary | ICD-10-CM | POA: Diagnosis not present

## 2016-08-30 DIAGNOSIS — K921 Melena: Secondary | ICD-10-CM | POA: Diagnosis not present

## 2016-08-30 DIAGNOSIS — R1013 Epigastric pain: Secondary | ICD-10-CM | POA: Diagnosis not present

## 2016-08-30 DIAGNOSIS — R110 Nausea: Secondary | ICD-10-CM | POA: Diagnosis not present

## 2016-08-30 DIAGNOSIS — R634 Abnormal weight loss: Secondary | ICD-10-CM | POA: Diagnosis not present

## 2016-09-06 DIAGNOSIS — F4310 Post-traumatic stress disorder, unspecified: Secondary | ICD-10-CM | POA: Diagnosis not present

## 2016-09-20 DIAGNOSIS — Z79899 Other long term (current) drug therapy: Secondary | ICD-10-CM | POA: Diagnosis not present

## 2016-11-15 ENCOUNTER — Other Ambulatory Visit: Payer: Self-pay | Admitting: Nurse Practitioner

## 2016-11-15 DIAGNOSIS — R1319 Other dysphagia: Secondary | ICD-10-CM | POA: Insufficient documentation

## 2016-11-15 DIAGNOSIS — R1084 Generalized abdominal pain: Secondary | ICD-10-CM

## 2016-11-15 DIAGNOSIS — Z8371 Family history of colonic polyps: Secondary | ICD-10-CM | POA: Diagnosis not present

## 2016-11-15 DIAGNOSIS — K219 Gastro-esophageal reflux disease without esophagitis: Secondary | ICD-10-CM | POA: Diagnosis not present

## 2016-11-15 DIAGNOSIS — K5909 Other constipation: Secondary | ICD-10-CM | POA: Diagnosis not present

## 2016-11-15 DIAGNOSIS — K625 Hemorrhage of anus and rectum: Secondary | ICD-10-CM | POA: Insufficient documentation

## 2016-11-15 DIAGNOSIS — Z83719 Family history of colon polyps, unspecified: Secondary | ICD-10-CM | POA: Insufficient documentation

## 2016-11-15 DIAGNOSIS — R109 Unspecified abdominal pain: Secondary | ICD-10-CM | POA: Insufficient documentation

## 2016-11-15 DIAGNOSIS — R748 Abnormal levels of other serum enzymes: Secondary | ICD-10-CM | POA: Insufficient documentation

## 2016-11-19 ENCOUNTER — Ambulatory Visit
Admission: RE | Admit: 2016-11-19 | Discharge: 2016-11-19 | Disposition: A | Discharge status: Home / Self Care | Payer: Medicare Other | Source: Ambulatory Visit | Attending: Nurse Practitioner | Admitting: Nurse Practitioner

## 2016-11-19 DIAGNOSIS — R1084 Generalized abdominal pain: Secondary | ICD-10-CM

## 2016-11-19 DIAGNOSIS — M2578 Osteophyte, vertebrae: Secondary | ICD-10-CM | POA: Insufficient documentation

## 2016-11-19 DIAGNOSIS — K228 Other specified diseases of esophagus: Secondary | ICD-10-CM | POA: Diagnosis not present

## 2016-11-19 DIAGNOSIS — R1319 Other dysphagia: Secondary | ICD-10-CM | POA: Diagnosis present

## 2016-11-19 DIAGNOSIS — R1310 Dysphagia, unspecified: Secondary | ICD-10-CM | POA: Diagnosis not present

## 2016-11-19 DIAGNOSIS — N1330 Unspecified hydronephrosis: Secondary | ICD-10-CM | POA: Diagnosis not present

## 2016-12-23 DIAGNOSIS — G40909 Epilepsy, unspecified, not intractable, without status epilepticus: Secondary | ICD-10-CM | POA: Diagnosis not present

## 2016-12-23 DIAGNOSIS — F17200 Nicotine dependence, unspecified, uncomplicated: Secondary | ICD-10-CM | POA: Diagnosis not present

## 2016-12-23 DIAGNOSIS — J449 Chronic obstructive pulmonary disease, unspecified: Secondary | ICD-10-CM | POA: Diagnosis not present

## 2016-12-23 DIAGNOSIS — T7840XA Allergy, unspecified, initial encounter: Secondary | ICD-10-CM | POA: Diagnosis not present

## 2016-12-23 DIAGNOSIS — Z716 Tobacco abuse counseling: Secondary | ICD-10-CM | POA: Diagnosis not present

## 2016-12-23 DIAGNOSIS — F17210 Nicotine dependence, cigarettes, uncomplicated: Secondary | ICD-10-CM | POA: Diagnosis not present

## 2016-12-23 DIAGNOSIS — Z23 Encounter for immunization: Secondary | ICD-10-CM | POA: Diagnosis not present

## 2016-12-24 DIAGNOSIS — M79661 Pain in right lower leg: Secondary | ICD-10-CM | POA: Diagnosis not present

## 2016-12-24 DIAGNOSIS — G603 Idiopathic progressive neuropathy: Secondary | ICD-10-CM | POA: Diagnosis not present

## 2016-12-24 DIAGNOSIS — G608 Other hereditary and idiopathic neuropathies: Secondary | ICD-10-CM | POA: Diagnosis not present

## 2016-12-27 DIAGNOSIS — J301 Allergic rhinitis due to pollen: Secondary | ICD-10-CM | POA: Diagnosis not present

## 2017-01-28 DIAGNOSIS — Z1231 Encounter for screening mammogram for malignant neoplasm of breast: Secondary | ICD-10-CM | POA: Diagnosis not present

## 2017-01-28 DIAGNOSIS — Z1151 Encounter for screening for human papillomavirus (HPV): Secondary | ICD-10-CM | POA: Diagnosis not present

## 2017-01-28 DIAGNOSIS — Z01419 Encounter for gynecological examination (general) (routine) without abnormal findings: Secondary | ICD-10-CM | POA: Diagnosis not present

## 2017-01-28 DIAGNOSIS — Z0000 Encounter for general adult medical examination without abnormal findings: Secondary | ICD-10-CM | POA: Diagnosis not present

## 2017-02-01 ENCOUNTER — Encounter: Payer: Self-pay | Admitting: *Deleted

## 2017-02-02 ENCOUNTER — Encounter
Admission: RE | Disposition: A | Discharge status: Home / Self Care | Payer: Self-pay | Source: Ambulatory Visit | Attending: Unknown Physician Specialty

## 2017-02-02 ENCOUNTER — Ambulatory Visit
Admission: RE | Admit: 2017-02-02 | Discharge: 2017-02-02 | Disposition: A | Discharge status: Home / Self Care | Payer: Medicare Other | Source: Ambulatory Visit | Attending: Unknown Physician Specialty | Admitting: Unknown Physician Specialty

## 2017-02-02 DIAGNOSIS — Z539 Procedure and treatment not carried out, unspecified reason: Secondary | ICD-10-CM | POA: Insufficient documentation

## 2017-02-02 DIAGNOSIS — K625 Hemorrhage of anus and rectum: Secondary | ICD-10-CM | POA: Insufficient documentation

## 2017-02-02 DIAGNOSIS — K219 Gastro-esophageal reflux disease without esophagitis: Secondary | ICD-10-CM | POA: Diagnosis not present

## 2017-02-02 DIAGNOSIS — Z86010 Personal history of colonic polyps: Secondary | ICD-10-CM | POA: Insufficient documentation

## 2017-02-02 DIAGNOSIS — R1310 Dysphagia, unspecified: Secondary | ICD-10-CM | POA: Diagnosis not present

## 2017-02-02 HISTORY — DX: Post-traumatic stress disorder, unspecified: F43.10

## 2017-02-02 HISTORY — DX: Unspecified lump in the left breast, unspecified quadrant: N63.20

## 2017-02-02 HISTORY — DX: Hemorrhage of anus and rectum: K62.5

## 2017-02-02 SURGERY — EGD (ESOPHAGOGASTRODUODENOSCOPY)
Anesthesia: General

## 2017-02-02 MED ORDER — FENTANYL CITRATE (PF) 100 MCG/2ML IJ SOLN
INTRAMUSCULAR | Status: AC
  Filled 2017-02-02: qty 2

## 2017-02-02 MED ORDER — SODIUM CHLORIDE 0.9 % IV SOLN
INTRAVENOUS | Status: DC
  Administered 2017-02-02: 1000 mL via INTRAVENOUS

## 2017-02-02 MED ORDER — SODIUM CHLORIDE 0.9 % IV SOLN: INTRAVENOUS | Status: DC

## 2017-02-02 MED ORDER — PROPOFOL 500 MG/50ML IV EMUL
INTRAVENOUS | Status: AC
  Filled 2017-02-02: qty 50

## 2017-04-15 ENCOUNTER — Other Ambulatory Visit: Payer: Self-pay | Admitting: Neurology

## 2017-04-15 DIAGNOSIS — G9389 Other specified disorders of brain: Secondary | ICD-10-CM

## 2017-04-22 ENCOUNTER — Ambulatory Visit
Admission: RE | Admit: 2017-04-22 | Discharge: 2017-04-22 | Disposition: A | Discharge status: Home / Self Care | Payer: Medicare Other | Source: Ambulatory Visit | Attending: Neurology | Admitting: Neurology

## 2017-04-22 DIAGNOSIS — G9389 Other specified disorders of brain: Secondary | ICD-10-CM | POA: Diagnosis present

## 2017-04-22 MED ORDER — GADOBENATE DIMEGLUMINE 529 MG/ML IV SOLN
10.0000 mL | Freq: Once | INTRAVENOUS | Status: AC | PRN
  Administered 2017-04-22: 10 mL via INTRAVENOUS

## 2017-04-25 ENCOUNTER — Ambulatory Visit: Payer: Medicare Other | Attending: Neurology | Admitting: Occupational Therapy

## 2017-05-04 ENCOUNTER — Encounter: Payer: Medicare Other | Admitting: Occupational Therapy

## 2017-05-11 ENCOUNTER — Encounter: Payer: Medicare Other | Admitting: Occupational Therapy

## 2017-05-17 ENCOUNTER — Ambulatory Visit: Payer: Medicare Other | Admitting: Occupational Therapy

## 2017-05-19 ENCOUNTER — Encounter: Payer: Medicare Other | Admitting: Occupational Therapy

## 2017-05-24 ENCOUNTER — Encounter: Payer: Medicare Other | Admitting: Occupational Therapy

## 2017-05-26 ENCOUNTER — Encounter: Payer: Medicare Other | Admitting: Occupational Therapy

## 2017-07-11 ENCOUNTER — Other Ambulatory Visit: Payer: Self-pay

## 2017-07-11 ENCOUNTER — Encounter: Payer: Self-pay | Admitting: Psychiatry

## 2017-07-11 ENCOUNTER — Ambulatory Visit (INDEPENDENT_AMBULATORY_CARE_PROVIDER_SITE_OTHER): Payer: Medicare Other | Admitting: Psychiatry

## 2017-07-11 VITALS — BP 146/87 | HR 70 | Temp 98.0°F

## 2017-07-11 DIAGNOSIS — R42 Dizziness and giddiness: Secondary | ICD-10-CM | POA: Insufficient documentation

## 2017-07-11 DIAGNOSIS — F4310 Post-traumatic stress disorder, unspecified: Secondary | ICD-10-CM

## 2017-07-11 NOTE — Progress Notes (Deleted)
Psychiatric Initial Adult Assessment   Patient Identification: [name] MRN:  [id number] Date of Evaluation:  07/11/2017 Referral Source: *** Chief Complaint:   Visit Diagnosis: No diagnosis found.  History of Present Illness:  ***  Associated Signs/Symptoms: Depression Symptoms:  {DEPRESSION SYMPTOMS:20000} (Hypo) Manic Symptoms:  {BHH MANIC SYMPTOMS:22872} Anxiety Symptoms:  {BHH ANXIETY SYMPTOMS:22873} Psychotic Symptoms:  {BHH PSYCHOTIC SYMPTOMS:22874} PTSD Symptoms: {BHH PTSD SYMPTOMS:22875}  Past Psychiatric History: ***  Previous Psychotropic Medications: {YES/NO:21197}  Substance Abuse History in the last 12 months:  {yes no:314532}  Consequences of Substance Abuse: {BHH CONSEQUENCES OF SUBSTANCE ABUSE:22880}  Past Medical History:  Past Medical History:  Diagnosis Date  . BRBPR (bright red blood per rectum)   . Breast mass, left   . Cancer (HCC)    CERVICAL  . Depression   . Falls    EASILY  . GERD (gastroesophageal reflux disease)   . PTSD (post-traumatic stress disorder)   . Seizures ([place])   . 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 Vertigo   . Vertigo     Past Surgical History:  Procedure Laterality Date  . abd pain    . BACK SURGERY     LUMBAR FUSIONS X 2  . BREAST SURGERY    . CATARACT EXTRACTION W/PHACO Right 01/07/2015   Procedure: CATARACT EXTRACTION PHACO AND INTRAOCULAR LENS PLACEMENT (IOC);  Surgeon: Birder Robson, MD;  Location: [facility] ORS;  Service: Ophthalmology;  Laterality: Right;  [place] 0 AP% 0 CDE 0 fluid pack lot #[id number]  . CATARACT EXTRACTION W/PHACO Left 01/28/2015   Procedure: CATARACT EXTRACTION PHACO AND INTRAOCULAR LENS PLACEMENT (IOC);  Surgeon: Birder Robson, MD;  Location: [facility] ORS;  Service: Ophthalmology;  Laterality: Left;  [place] 00:18 AP%7.1 CDE1.32 fluid pack lot # [id number]  . LEEP      Family Psychiatric History: ***  Family  History:  Family History  Problem Relation Age of Onset  . Ovarian cancer Maternal Aunt        40's    Social History:   Social History   Socioeconomic History  . Marital status: Divorced    Spouse name: Not on file  . Number of children: Not on file  . Years of education: Not on file  . Highest education level: Not on file  Occupational History  . Not on file  Social Needs  . Financial resource strain: Not on file  . Food insecurity:    Worry: Not on file    Inability: Not on file  . Transportation needs:    Medical: Not on file    Non-medical: Not on file  Tobacco Use  . Smoking status: Current Every Day Smoker    Packs/day: 1.00    Types: Cigarettes  . Smokeless tobacco: Never Used  Substance and Sexual Activity  . Alcohol use: No  . Drug use: Yes    Types: Marijuana  . Sexual activity: Never  Lifestyle  . Physical activity:    Days per week: Not on file    Minutes per session: Not on file  . Stress: Not on file  Relationships  . Social connections:    Talks on phone: Not on file    Gets together: Not on file    Attends religious service: Not on file    Active member of club or organization: Not on file    Attends meetings of clubs or  organizations: Not on file    Relationship status: Not on file  Other Topics Concern  . Not on file  Social History Narrative  . Not on file    Additional Social History: ***  Allergies:   Allergies  Allergen Reactions  . Contrast Media [Iodinated Diagnostic Agents]   . Diphenhydramine Hcl Hives and Itching  . Adhesive [Tape]     PAPER TAPE OK  . Sulfa Antibiotics Rash    Metabolic Disorder Labs: No results found for: HGBA1C, MPG No results found for: PROLACTIN No results found for: CHOL, TRIG, HDL, CHOLHDL, VLDL, LDLCALC   Current Medications: Current Outpatient Medications  Medication Sig Dispense Refill  . azithromycin (ZITHROMAX) 250 MG tablet 2 tablets today, then 1 tablet for the next 4 days. (Patient  not taking: Reported on 02/02/2017) 6 each 0  . carisoprodol (SOMA) 350 MG tablet Take 1 to 3 by mouth times daily    . desvenlafaxine (PRISTIQ) 100 MG 24 hr tablet Take 100 mg by mouth daily.    . fexofenadine (ALLEGRA) 180 MG tablet Take 180 mg by mouth daily.     Marland Kitchen lamoTRIgine (LAMICTAL) 100 MG tablet Take 100 mg by mouth 2 (two) times daily.     Marland Kitchen levETIRAcetam (KEPPRA) 500 MG tablet Take 500 mg by mouth 2 (two) times daily.     . meloxicam (MOBIC) 15 MG tablet Take 1 tablet (15 mg total) by mouth daily. 30 tablet 0  . omeprazole (PRILOSEC) 40 MG capsule Take 40 mg by mouth daily.    . ondansetron (ZOFRAN) 4 MG tablet Take 1 tablet (4 mg total) by mouth every 8 (eight) hours as needed for nausea or vomiting. 20 tablet 0  . oxyCODONE (OXY IR/ROXICODONE) 5 MG immediate release tablet Take 5 mg by mouth 2 (two) times daily.     . predniSONE (DELTASONE) 10 MG tablet Take 5 tablets (50 mg total) by mouth daily. 25 tablet 0  . promethazine-phenylephrine (PROMETHAZINE VC) 6.25-5 MG/5ML SYRP Take 5 mLs by mouth every 4 (four) hours as needed for congestion. 280 mL 0  . traZODone (DESYREL) 50 MG tablet Take 150 mg by mouth at bedtime.      No current facility-administered medications for this visit.     Neurologic: Headache: {BHH YES OR NO:22294} Seizure: {BHH YES OR NO:22294} Paresthesias:{BHH YES OR PO:24235}  Musculoskeletal: Strength & Muscle Tone: {desc; muscle tone:32375} Gait & Station: {PE GAIT ED TIRW:43154} Patient leans: {Patient Leans:21022755}  Psychiatric Specialty Exam: ROS  There were no vitals taken for this visit.There is no height or weight on file to calculate BMI.  General Appearance: {Appearance:22683}  Eye Contact:  {BHH EYE CONTACT:22684}  Speech:  {Speech:22685}  Volume:  {Volume (PAA):22686}  Mood:  {BHH MOOD:22306}  Affect:  {Affect (PAA):22687}  Thought Process:  {Thought Process (PAA):22688}  Orientation:  {BHH ORIENTATION (PAA):22689}  Thought Content:   {Thought Content:22690}  Suicidal Thoughts:  {ST/HT (PAA):22692}  Homicidal Thoughts:  {ST/HT (PAA):22692}  Memory:  {BHH MEMORY:22881}  Judgement:  {Judgement (PAA):22694}  Insight:  {Insight (PAA):22695}  Psychomotor Activity:  {Psychomotor (PAA):22696}  Concentration:  {Concentration:21399}  Recall:  {BHH GOOD/FAIR/POOR:22877}  Fund of Knowledge:{BHH GOOD/FAIR/POOR:22877}  Language: {BHH GOOD/FAIR/POOR:22877}  Akathisia:  {BHH YES OR NO:22294}  Handed:  {Handed:22697}  AIMS (if indicated):  ***  Assets:  {Assets (PAA):22698}  ADL's:  {BHH MGQ'Q:76195}  Cognition: {chl bhh cognition:304700322}  Sleep:  ***    Treatment Plan Summary: {CHL AMB BH MD TX [phone]}   Sianna Garofano,  MD 5/13/20198:51 AM

## 2017-07-11 NOTE — Progress Notes (Signed)
Patient ID: [name], female   DOB: [birth year], 52 y.o.   MRN: [id number]     Patient seemed very upset during the evaluation.  Patient could not participate in the evaluation due to being angry.  Patient got up and walked out without completing the evaluation.

## 2017-08-05 ENCOUNTER — Ambulatory Visit (INDEPENDENT_AMBULATORY_CARE_PROVIDER_SITE_OTHER): Payer: Self-pay | Admitting: Specialist

## 2017-08-24 ENCOUNTER — Ambulatory Visit (INDEPENDENT_AMBULATORY_CARE_PROVIDER_SITE_OTHER): Payer: Self-pay

## 2017-08-24 ENCOUNTER — Encounter (INDEPENDENT_AMBULATORY_CARE_PROVIDER_SITE_OTHER): Payer: Self-pay | Admitting: Specialist

## 2017-08-24 ENCOUNTER — Ambulatory Visit (INDEPENDENT_AMBULATORY_CARE_PROVIDER_SITE_OTHER): Payer: Medicare Other | Admitting: Specialist

## 2017-08-24 VITALS — BP 138/89 | HR 65 | Ht 67.5 in | Wt 125.0 lb

## 2017-08-24 DIAGNOSIS — G8929 Other chronic pain: Secondary | ICD-10-CM

## 2017-08-24 DIAGNOSIS — M5030 Other cervical disc degeneration, unspecified cervical region: Secondary | ICD-10-CM | POA: Diagnosis not present

## 2017-08-24 DIAGNOSIS — S0990XS Unspecified injury of head, sequela: Secondary | ICD-10-CM | POA: Diagnosis not present

## 2017-08-24 DIAGNOSIS — M4802 Spinal stenosis, cervical region: Secondary | ICD-10-CM | POA: Diagnosis not present

## 2017-08-24 DIAGNOSIS — M5136 Other intervertebral disc degeneration, lumbar region: Secondary | ICD-10-CM

## 2017-08-24 DIAGNOSIS — M545 Low back pain: Secondary | ICD-10-CM | POA: Diagnosis not present

## 2017-08-24 DIAGNOSIS — M4726 Other spondylosis with radiculopathy, lumbar region: Secondary | ICD-10-CM | POA: Diagnosis not present

## 2017-08-24 DIAGNOSIS — M542 Cervicalgia: Secondary | ICD-10-CM | POA: Diagnosis not present

## 2017-08-24 MED ORDER — DIAZEPAM 2 MG PO TABS: ORAL_TABLET | ORAL | 0 refills | Status: DC

## 2017-08-24 MED ORDER — BACLOFEN 10 MG PO TABS: 10.0000 mg | ORAL_TABLET | Freq: Two times a day (BID) | ORAL | 0 refills | Status: DC

## 2017-08-24 MED ORDER — MELOXICAM 7.5 MG PO TABS: 7.5000 mg | ORAL_TABLET | Freq: Every day | ORAL | Status: DC

## 2017-08-24 NOTE — Patient Instructions (Signed)
Avoid overhead lifting and overhead use of the arms. Do not lift greater than 5 lbs. Adjust head rest in vehicle to prevent hyperextension if rear ended. Take extra precautions to avoid falling. Surgery is indicated due to upper extremity radiculopathy. In the future surgery at adjacent levels may be necessary but these levels do not appear to be related to your current symptoms or signs. Avoid frequent bending and stooping  No lifting greater than 10 lbs. May use ice or moist heat for pain. Weight loss is of benefit. Handicap license is approved.  Fall Prevention and Home Safety Falls cause injuries and can affect all age groups. It is possible to use preventive measures to significantly decrease the likelihood of falls. There are many simple measures which can make your home safer and prevent falls. OUTDOORS  Repair cracks and edges of walkways and driveways.  Remove high doorway thresholds.  Trim shrubbery on the main path into your home.  Have good outside lighting.  Clear walkways of tools, rocks, debris, and clutter.  Check that handrails are not broken and are securely fastened. Both sides of steps should have handrails.  Have leaves, snow, and ice cleared regularly.  Use sand or salt on walkways during winter months.  In the garage, clean up grease or oil spills. BATHROOM  Install night lights.  Install grab bars by the toilet and in the tub and shower.  Use non-skid mats or decals in the tub or shower.  Place a plastic non-slip stool in the shower to sit on, if needed.  Keep floors dry and clean up all water on the floor immediately.  Remove soap buildup in the tub or shower on a regular basis.  Secure bath mats with non-slip, double-sided rug tape.  Remove throw rugs and tripping hazards from the floors. BEDROOMS  Install night lights.  Make sure a bedside light is easy to reach.  Do not use oversized bedding.  Keep a telephone by your  bedside.  Have a firm chair with side arms to use for getting dressed.  Remove throw rugs and tripping hazards from the floor. KITCHEN  Keep handles on pots and pans turned toward the center of the stove. Use back burners when possible.  Clean up spills quickly and allow time for drying.  Avoid walking on wet floors.  Avoid hot utensils and knives.  Position shelves so they are not too high or low.  Place commonly used objects within easy reach.  If necessary, use a sturdy step stool with a grab bar when reaching.  Keep electrical cables out of the way.  Do not use floor polish or wax that makes floors slippery. If you must use wax, use non-skid floor wax.  Remove throw rugs and tripping hazards from the floor. STAIRWAYS  Never leave objects on stairs.  Place handrails on both sides of stairways and use them. Fix any loose handrails. Make sure handrails on both sides of the stairways are as [place] as the stairs.  Check carpeting to make sure it is firmly attached along stairs. Make repairs to worn or loose carpet promptly.  Avoid placing throw rugs at the top or bottom of stairways, or properly secure the rug with carpet tape to prevent slippage. Get rid of throw rugs, if possible.  Have an electrician put in a light switch at the top and bottom of the stairs. OTHER FALL PREVENTION TIPS  Wear low-heel or rubber-soled shoes that are supportive and fit well. Wear closed toe  shoes.  When using a stepladder, make sure it is fully opened and both spreaders are firmly locked. Do not climb a closed stepladder.  Add color or contrast paint or tape to grab bars and handrails in your home. Place contrasting color strips on first and last steps.  Learn and use mobility aids as needed. Install an electrical emergency response system.  Turn on lights to avoid dark areas. Replace light bulbs that burn out immediately. Get light switches that glow.  Arrange furniture to create clear  pathways. Keep furniture in the same place.  Firmly attach carpet with non-skid or double-sided tape.  Eliminate uneven floor surfaces.  Select a carpet pattern that does not visually hide the edge of steps.  Be aware of all pets. OTHER HOME SAFETY TIPS  Set the water temperature for 120 F (48.8 C).  Keep emergency numbers on or near the telephone.  Keep smoke detectors on every level of the home and near sleeping areas. Document Released: 02/05/2002 Document Revised: 08/17/2011 Document Reviewed: 05/07/2011 [facility] Patient Information 2014 [place]. MRI of the cervical spine ordered Start baclofen 10 mg BID Start mobic 7.5 mg po daily with meal or snack.
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
## 2017-09-06 ENCOUNTER — Telehealth (INDEPENDENT_AMBULATORY_CARE_PROVIDER_SITE_OTHER): Payer: Self-pay | Admitting: *Deleted

## 2017-09-06 NOTE — Telephone Encounter (Signed)
Pt called inquiring about her MRI cervical spine states she has not heard from anyone to get scheduled, I called pt back advised her order was sent to [facility] imaging and they will be giving her a call to schedule, gave pt # to call as well. Pt is also wanting lov notes sent to her pain management Doctor Chancy Milroy to [phone]. I faxed ov notes to this number.

## 2017-09-23 ENCOUNTER — Ambulatory Visit
Admission: RE | Admit: 2017-09-23 | Discharge: 2017-09-23 | Disposition: A | Discharge status: Home / Self Care | Payer: Medicare Other | Source: Ambulatory Visit | Attending: Specialist | Admitting: Specialist

## 2017-09-23 DIAGNOSIS — M5030 Other cervical disc degeneration, unspecified cervical region: Secondary | ICD-10-CM

## 2017-09-27 ENCOUNTER — Other Ambulatory Visit (INDEPENDENT_AMBULATORY_CARE_PROVIDER_SITE_OTHER): Payer: Self-pay | Admitting: Specialist

## 2017-09-27 NOTE — Telephone Encounter (Signed)
Diazepam and Baclofen refill request

## 2017-10-13 ENCOUNTER — Encounter (INDEPENDENT_AMBULATORY_CARE_PROVIDER_SITE_OTHER): Payer: Self-pay | Admitting: Specialist

## 2017-10-13 ENCOUNTER — Ambulatory Visit (INDEPENDENT_AMBULATORY_CARE_PROVIDER_SITE_OTHER): Payer: Medicare Other | Admitting: Specialist

## 2017-10-13 VITALS — BP 125/85 | HR 89 | Ht 67.5 in | Wt 125.0 lb

## 2017-10-13 DIAGNOSIS — M542 Cervicalgia: Secondary | ICD-10-CM

## 2017-10-13 DIAGNOSIS — G8929 Other chronic pain: Secondary | ICD-10-CM

## 2017-10-13 DIAGNOSIS — M5442 Lumbago with sciatica, left side: Secondary | ICD-10-CM | POA: Diagnosis not present

## 2017-10-13 MED ORDER — DICLOFENAC SODIUM 1 % TD GEL: 4.0000 g | Freq: Four times a day (QID) | TRANSDERMAL | 3 refills | Status: DC
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
## 2017-11-25 ENCOUNTER — Ambulatory Visit (INDEPENDENT_AMBULATORY_CARE_PROVIDER_SITE_OTHER): Payer: Medicare Other | Admitting: Specialist

## 2018-02-17 ENCOUNTER — Other Ambulatory Visit: Payer: Self-pay

## 2018-02-17 ENCOUNTER — Encounter: Payer: Self-pay | Admitting: Emergency Medicine

## 2018-02-17 ENCOUNTER — Emergency Department
Admission: EM | Admit: 2018-02-17 | Discharge: 2018-02-17 | Discharge status: Against Medical Advice | Payer: Medicare Other | Attending: Emergency Medicine | Admitting: Emergency Medicine

## 2018-02-17 DIAGNOSIS — Z5321 Procedure and treatment not carried out due to patient leaving prior to being seen by health care provider: Secondary | ICD-10-CM | POA: Insufficient documentation

## 2018-02-17 DIAGNOSIS — R569 Unspecified convulsions: Secondary | ICD-10-CM | POA: Diagnosis not present

## 2018-02-17 NOTE — ED Triage Notes (Signed)
Pt called from [facility] to treatment room, no response 

## 2018-02-17 NOTE — ED Triage Notes (Signed)
Pt called from [place] to treatment room. Pt states loudly "hell naw", "that lady just said there wasn't no rooms". RN asked pt if she was going to be seen or leave. Pt states "When I leave, you will know I fucking left cause you won't see me sitting here". Pt advised there was a room ready when she was ready to go back.

## 2018-02-17 NOTE — ED Triage Notes (Signed)
Pt here with c/o seizures, hx of the same. Friend states multiple seizures this am, has been taking meds, tbi 10 years ago, "this is normal for her." Pt stuttering in triage, friend states she is dehydrated, vomited yesterday, none today, NAD.

## 2018-02-17 NOTE — ED Triage Notes (Signed)
Unable to locate PT.

## 2018-02-17 NOTE — ED Triage Notes (Signed)
Pt advocate to take pt to room. Pt not located in [place] and unable to locate outside.

## 2018-09-18 ENCOUNTER — Ambulatory Visit: Payer: Medicare Other

## 2018-09-18 ENCOUNTER — Other Ambulatory Visit: Payer: Self-pay

## 2018-09-18 ENCOUNTER — Ambulatory Visit
Admission: EM | Admit: 2018-09-18 | Discharge: 2018-09-18 | Disposition: A | Discharge status: Home / Self Care | Payer: Medicare Other | Attending: Family Medicine | Admitting: Family Medicine

## 2018-09-18 ENCOUNTER — Encounter: Payer: Self-pay | Admitting: Emergency Medicine

## 2018-09-18 DIAGNOSIS — S0990XA Unspecified injury of head, initial encounter: Secondary | ICD-10-CM

## 2018-09-18 DIAGNOSIS — S060X9A Concussion with loss of consciousness of unspecified duration, initial encounter: Secondary | ICD-10-CM | POA: Insufficient documentation

## 2018-09-18 DIAGNOSIS — S0083XA Contusion of other part of head, initial encounter: Secondary | ICD-10-CM | POA: Insufficient documentation

## 2018-09-18 DIAGNOSIS — S20212A Contusion of left front wall of thorax, initial encounter: Secondary | ICD-10-CM | POA: Insufficient documentation

## 2018-09-18 DIAGNOSIS — S8012XA Contusion of left lower leg, initial encounter: Secondary | ICD-10-CM | POA: Diagnosis present

## 2018-09-18 DIAGNOSIS — W01198A Fall on same level from slipping, tripping and stumbling with subsequent striking against other object, initial encounter: Secondary | ICD-10-CM

## 2018-09-18 NOTE — Discharge Instructions (Addendum)
Recommend patient go to Emergency Department for further evaluation of her head injury with loss of consciousness Rest, ice, over the counter analgesics for other injuries (contusions)
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
## 2018-09-18 NOTE — ED Provider Notes (Signed)
[facility]    CSN: [id number] Arrival date & time: 09/18/18  1542     History   Chief Complaint Chief Complaint  Patient presents with  . Fall    HPI [name] is a 53 y.o. female.   54 yo female with a c/o a fall yesterday at home. States she tripped and fell on the ground hitting a large boulder, hitting her face, head, chest and leg. States she "passed out" but does not know for how [place]. Fall was not witnessed. Complains of pain to her head, face, chest/ribs, and left leg. Denies any vision changes, vomiting, or any changes from her baseline neurologic status (husband states patient has a h/o a traumatic brain injury years ago and has residual slurring of speech).    Fall    Past Medical History:  Diagnosis Date  . BRBPR (bright red blood per rectum)   . Breast mass, left   . Cancer (HCC)    CERVICAL  . Depression   . Falls    EASILY  . GERD (gastroesophageal reflux disease)   . PTSD (post-traumatic stress disorder)   . Seizures ([place])   . 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 TBI (traumatic brain injury) ([place])    SKULL FX 2009  . Vertigo   . Vertigo     Patient Active Problem List   Diagnosis Date Noted  . Vertigo 07/11/2017  . Generalized abdominal pain 11/15/2016  . FH: colon polyps 11/15/2016  . Elevated alkaline phosphatase level 11/15/2016  . BRBPR (bright red blood per rectum) 11/15/2016  . Other dysphagia 11/15/2016  . Other constipation 11/15/2016  . PTSD (post-traumatic stress disorder) 05/18/2016  . Breast mass, left 01/08/2016  . Anxiety and depression 01/08/2016  . Chronic pain 01/08/2016  . Seizure disorder ([place]) 01/08/2016  . GERD (gastroesophageal reflux disease) 01/08/2016  . Difficulty sleeping 08/05/2015  . Mood disorder ([place]) 05/05/2015  . Tobacco abuse counseling 04/15/2015  . Crushing injury of neck 09/27/2011  . Head injury 05/06/2011    Past Surgical History:  Procedure Laterality  Date  . abd pain    . BACK SURGERY     LUMBAR FUSIONS X 2  . BREAST SURGERY    . CATARACT EXTRACTION W/PHACO Right 01/07/2015   Procedure: CATARACT EXTRACTION PHACO AND INTRAOCULAR LENS PLACEMENT (IOC);  Surgeon: Birder Robson, MD;  Location: [facility] ORS;  Service: Ophthalmology;  Laterality: Right;  [place] 0 AP% 0 CDE 0 fluid pack lot #[id number]  . CATARACT EXTRACTION W/PHACO Left 01/28/2015   Procedure: CATARACT EXTRACTION PHACO AND INTRAOCULAR LENS PLACEMENT (IOC);  Surgeon: Birder Robson, MD;  Location: [facility] ORS;  Service: Ophthalmology;  Laterality: Left;  [place] 00:18 AP%7.1 CDE1.32 fluid pack lot # [id number]  . LEEP      OB History   No obstetric history on file.      Home Medications    Prior to Admission medications   Medication Sig Start Date End Date Taking? Authorizing Provider  albuterol (PROVENTIL HFA;VENTOLIN HFA) 108 (90 Base) MCG/ACT inhaler INL 2 PFS PO Q 4 TO 6 H IF NEEDED 05/04/17  Yes [provider]  desvenlafaxine (PRISTIQ) 100 MG 24 hr tablet Take 100 mg by mouth daily.   Yes [provider]  fexofenadine (ALLEGRA) 180 MG tablet Take 180 mg by mouth daily.    Yes [provider]  gabapentin (NEURONTIN) 300 MG capsule TK 1 C PO BID FOR 1 WEEK. INCREASE  TO 300 MG 3 TIMES A DAY AND. CONT THAT DOSE 06/20/17  Yes [provider]  lamoTRIgine (LAMICTAL) 150 MG tablet Take 1/2 tab in the morning and 1 tab at night for two weeks then increase to 1 tab twice a day and continue that dose 06/20/17  Yes [provider]  omeprazole (PRILOSEC) 40 MG capsule Take 40 mg by mouth daily.   Yes [provider]  SPIRIVA RESPIMAT 2.5 MCG/ACT AERS INL 2 PFS PO ONCE D 06/17/17  Yes [provider]  ARIPiprazole (ABILIFY) 5 MG tablet Take by mouth. 08/02/17 09/01/17  [provider]  baclofen (LIORESAL) 10 MG tablet TAKE 1 TABLET(10 MG) BY MOUTH TWICE DAILY 09/27/17   Jessy Oto, MD  carisoprodol (SOMA) 350 MG tablet Take  1 to 3 by mouth times daily    [provider]  desvenlafaxine (PRISTIQ) 50 MG 24 hr tablet TK 1 T PO ONCE D 06/05/17   [provider]  diazepam (VALIUM) 2 MG tablet Take at the MRI and may repeat once for anxiety due to MRI. 08/24/17   Jessy Oto, MD  diclofenac sodium (VOLTAREN) 1 % GEL Apply 4 g topically 4 (four) times daily. 10/13/17   Jessy Oto, MD  Levomilnacipran HCl ER (FETZIMA) 80 MG CP24 Take by mouth.    [provider]  ondansetron (ZOFRAN) 4 MG tablet Take 1 tablet (4 mg total) by mouth every 8 (eight) hours as needed for nausea or vomiting. 04/11/16   Nance Pear, MD  oxyCODONE (OXY IR/ROXICODONE) 5 MG immediate release tablet TK 1 T PO BID PRN 08/01/17   [provider]  promethazine-phenylephrine (PROMETHAZINE VC) 6.25-5 MG/5ML SYRP Take 5 mLs by mouth every 4 (four) hours as needed for congestion. 04/10/16   Victorino Dike, FNP  REXULTI 2 MG TABS  04/05/17   [provider]    Family History Family History  Problem Relation Age of Onset  . Ovarian cancer Maternal Aunt        40's    Social History Social History   Tobacco Use  . Smoking status: Current Every Day Smoker    Packs/day: 1.00    Types: Cigarettes  . Smokeless tobacco: Never Used  Substance Use Topics  . Alcohol use: No  . Drug use: Yes    Types: Marijuana     Allergies   Contrast media [iodinated diagnostic agents], Diphenhydramine hcl, Adhesive [tape], Iodine, Prednisone, and Sulfa antibiotics   Review of Systems Review of Systems   Physical Exam Triage Vital Signs ED Triage Vitals  Enc Vitals Group     BP 09/18/18 1621 123/80     Pulse Rate 09/18/18 1621 75     Resp 09/18/18 1621 18     Temp 09/18/18 1621 98.7 F (37.1 C)     Temp Source 09/18/18 1621 Oral     SpO2 09/18/18 1621 97 %     Weight 09/18/18 1614 129 lb (58.5 kg)     Height 09/18/18 1614 5\' 8"  (1.727 m)     Head Circumference --      Peak Flow --      Pain Score  09/18/18 1614 7     Pain Loc --      Pain Edu? --      Excl. in [place]? --    No data found.  Updated Vital Signs BP 123/80 (BP Location: Left Arm)   Pulse 75   Temp 98.7 F (37.1 C) (  Oral)   Resp 18   Ht 5\' 8"  (1.727 m)   Wt 58.5 kg   SpO2 97%   BMI 19.61 kg/m   Visual Acuity Right Eye Distance:   Left Eye Distance:   Bilateral Distance:    Right Eye Near:   Left Eye Near:    Bilateral Near:     Physical Exam Vitals signs and nursing note reviewed.  Constitutional:      General: She is not in acute distress.    Appearance: She is not toxic-appearing or diaphoretic.  HENT:     Right Ear: External ear normal.     Left Ear: External ear normal.  Eyes:     Extraocular Movements: Extraocular movements intact.     Pupils: Pupils are equal, round, and reactive to light.  Cardiovascular:     Pulses: Normal pulses.  Pulmonary:     Effort: Pulmonary effort is normal. No respiratory distress.     Breath sounds: Normal breath sounds. No stridor. No wheezing, rhonchi or rales.  Chest:     Chest wall: Tenderness (sternum and left chest wall) present.  Abdominal:     General: There is no distension.     Palpations: Abdomen is soft.  Musculoskeletal:     Left lower leg: She exhibits bony tenderness (over the tibia) and swelling. She exhibits no laceration.     Comments: Extremities neurovascularly intact  Neurological:     General: No focal deficit present.     Mental Status: She is alert and oriented to person, place, and time. Mental status is at baseline.     Cranial Nerves: No cranial nerve deficit.     Sensory: No sensory deficit.     Motor: No weakness.     Coordination: Coordination normal.     Gait: Gait normal.     Deep Tendon Reflexes: Reflexes normal.      UC Treatments / Results  Labs (all labs ordered are listed, but only abnormal results are displayed) Labs Reviewed - No data to display  EKG   Radiology Dg Mandible 1-3 Views  Result Date:  09/18/2018 CLINICAL DATA:  Fall last night hitting face.  Left jaw pain. EXAM: MANDIBLE - 1-3 VIEW COMPARISON:  None. FINDINGS: There is no evidence of fracture or other focal bone lesions. IMPRESSION: No acute findings. Electronically Signed   By: Marin Olp M.D.   On: 09/18/2018 17:28   Dg Ribs Unilateral W/chest Left  Result Date: 09/18/2018 CLINICAL DATA:  Fall last night with left mid rib pain and sternal pain. EXAM: LEFT RIBS AND CHEST - 3+ VIEW COMPARISON:  04/10/2016 FINDINGS: Lungs are adequately inflated and otherwise clear. Cardiomediastinal silhouette is normal. No acute rib fracture. Remainder the exam is unremarkable. IMPRESSION: No acute findings. Electronically Signed   By: Marin Olp M.D.   On: 09/18/2018 17:26   Dg Tibia/fibula Left  Result Date: 09/18/2018 CLINICAL DATA:  [place] last night, lost her balance EXAM: LEFT TIBIA AND FIBULA - 2 VIEW COMPARISON:  None FINDINGS: Low normal osseous mineralization. Knee and ankle joint alignments normal. Non fused ossicle at tip of lateral malleolus. No acute fracture, dislocation, or bone destruction. IMPRESSION: No acute osseous abnormalities. Electronically Signed   By: Lavonia Dana M.D.   On: 09/18/2018 17:20    Procedures Procedures (including critical care time)  Medications Ordered in UC Medications - No data to display  Initial Impression / Assessment and Plan / UC Course  I have reviewed the triage vital  signs and the nursing notes.  Pertinent labs & imaging results that were available during my care of the patient were reviewed by me and considered in my medical decision making (see chart for details).      Final Clinical Impressions(s) / UC Diagnoses   Final diagnoses:  Contusion of face, initial encounter  Injury of head, initial encounter  Contusion of left chest wall, initial encounter  Contusion of left lower extremity, initial encounter  Concussion with loss of consciousness, initial encounter      Discharge Instructions     Recommend patient go to Emergency Department for further evaluation of her head injury with loss of consciousness Rest, ice, over the counter analgesics for other injuries (contusions)    ED Prescriptions    None      1. x-ray results (negative for fracture) and diagnosis reviewed with patient and husband 2. Recommend supportive treatment as above 3. Recommend patient go to Emergency Department for further evaluation of closed head injury and loss of consciousness; patient verbalizes understanding, is in stable condition and husband will take by private vehicle 4. Follow-up prn   Controlled Substance Prescriptions [place] Controlled Substance Registry consulted? Not Applicable   Norval Gable, MD 09/18/18 [phone]

## 2018-09-20 ENCOUNTER — Encounter: Payer: Self-pay | Admitting: Emergency Medicine

## 2018-09-20 ENCOUNTER — Emergency Department: Payer: Medicare Other

## 2018-09-20 ENCOUNTER — Emergency Department
Admission: EM | Admit: 2018-09-20 | Discharge: 2018-09-20 | Disposition: A | Discharge status: Home / Self Care | Payer: Medicare Other | Attending: Emergency Medicine | Admitting: Emergency Medicine

## 2018-09-20 ENCOUNTER — Other Ambulatory Visit: Payer: Self-pay

## 2018-09-20 DIAGNOSIS — Y92096 Garden or yard of other non-institutional residence as the place of occurrence of the external cause: Secondary | ICD-10-CM | POA: Insufficient documentation

## 2018-09-20 DIAGNOSIS — F17210 Nicotine dependence, cigarettes, uncomplicated: Secondary | ICD-10-CM | POA: Diagnosis not present

## 2018-09-20 DIAGNOSIS — Y999 Unspecified external cause status: Secondary | ICD-10-CM | POA: Insufficient documentation

## 2018-09-20 DIAGNOSIS — S0990XA Unspecified injury of head, initial encounter: Secondary | ICD-10-CM | POA: Insufficient documentation

## 2018-09-20 DIAGNOSIS — Y9301 Activity, walking, marching and hiking: Secondary | ICD-10-CM | POA: Insufficient documentation

## 2018-09-20 DIAGNOSIS — W01198A Fall on same level from slipping, tripping and stumbling with subsequent striking against other object, initial encounter: Secondary | ICD-10-CM | POA: Diagnosis not present

## 2018-09-20 DIAGNOSIS — Z79899 Other long term (current) drug therapy: Secondary | ICD-10-CM | POA: Insufficient documentation

## 2018-09-20 DIAGNOSIS — W19XXXA Unspecified fall, initial encounter: Secondary | ICD-10-CM

## 2018-09-20 LAB — URINALYSIS, COMPLETE (UACMP) WITH MICROSCOPIC
Bacteria, UA: NONE SEEN
Bilirubin Urine: NEGATIVE
Glucose, UA: NEGATIVE mg/dL
Hgb urine dipstick: NEGATIVE
Ketones, ur: NEGATIVE mg/dL
Nitrite: NEGATIVE
Protein, ur: NEGATIVE mg/dL
Specific Gravity, Urine: 1.019 (ref 1.005–1.030)
pH: 5 (ref 5.0–8.0)

## 2018-09-20 LAB — CBC
HCT: 45.1 % (ref 36.0–46.0)
Hemoglobin: 14.9 g/dL (ref 12.0–15.0)
MCH: 30.6 pg (ref 26.0–34.0)
MCHC: 33 g/dL (ref 30.0–36.0)
MCV: 92.6 fL (ref 80.0–100.0)
Platelets: 334 K/uL (ref 150–400)
RBC: 4.87 MIL/uL (ref 3.87–5.11)
RDW: 13.6 % (ref 11.5–15.5)
WBC: 11.9 K/uL — ABNORMAL HIGH (ref 4.0–10.5)
nRBC: 0 % (ref 0.0–0.2)

## 2018-09-20 LAB — BASIC METABOLIC PANEL
Anion gap: 9 (ref 5–15)
BUN: 15 mg/dL (ref 6–20)
CO2: 29 mmol/L (ref 22–32)
Calcium: 9.4 mg/dL (ref 8.9–10.3)
Chloride: 102 mmol/L (ref 98–111)
Creatinine, Ser: 0.77 mg/dL (ref 0.44–1.00)
GFR calc Af Amer: >60 mL/min (ref >60)
GFR calc non Af Amer: >60 mL/min (ref >60)
Glucose, Bld: 112 mg/dL — ABNORMAL HIGH (ref 70–99)
Potassium: 3.7 mmol/L (ref 3.5–5.1)
Sodium: 140 mmol/L (ref 135–145)

## 2018-09-20 LAB — CK: Total CK: 119 U/L (ref 38–234)

## 2018-09-20 MED ORDER — OXYCODONE-ACETAMINOPHEN 5-325 MG PO TABS
1.0000 | ORAL_TABLET | Freq: Once | ORAL | Status: AC
  Administered 2018-09-20: 18:00:00 1 via ORAL
  Filled 2018-09-20: qty 1

## 2018-09-20 MED ORDER — OXYCODONE-ACETAMINOPHEN 5-325 MG PO TABS: 1.0000 | ORAL_TABLET | Freq: Three times a day (TID) | ORAL | 0 refills | Status: DC | PRN

## 2018-09-20 NOTE — ED Triage Notes (Signed)
Pt fell face first into large boulder Sunday.  Was seen at urgent care.  + LOC.  Pt here today because started having bloody drainage from right ear.  Dark circles/bruising below eyes that pt reports is not normal.  Has bruising/abrasions to leg as well.

## 2018-09-20 NOTE — ED Triage Notes (Signed)
Called CT and requested take patient as soon as possible.
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
## 2018-09-20 NOTE — ED Triage Notes (Signed)
Discussed with dr Ellender Hose.

## 2018-09-21 LAB — URINE CULTURE
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
## 2018-09-26 LAB — LIPID PANEL
Cholesterol: 269 — AB (ref 0–200)
HDL: 47 (ref 35–70)
Triglycerides: 92 (ref 40–160)

## 2019-01-23 ENCOUNTER — Inpatient Hospital Stay
Admission: EM | Admit: 2019-01-23 | Discharge: 2019-01-28 | DRG: 343 | Disposition: A | Discharge status: Home / Self Care | Payer: Medicare Other | Attending: Internal Medicine | Admitting: Internal Medicine

## 2019-01-23 ENCOUNTER — Encounter: Payer: Self-pay | Admitting: Medical Oncology

## 2019-01-23 ENCOUNTER — Emergency Department: Payer: Medicare Other

## 2019-01-23 ENCOUNTER — Other Ambulatory Visit: Payer: Self-pay

## 2019-01-23 ENCOUNTER — Inpatient Hospital Stay: Payer: Medicare Other

## 2019-01-23 DIAGNOSIS — R1010 Upper abdominal pain, unspecified: Secondary | ICD-10-CM | POA: Diagnosis not present

## 2019-01-23 DIAGNOSIS — Z79891 Long term (current) use of opiate analgesic: Secondary | ICD-10-CM

## 2019-01-23 DIAGNOSIS — R519 Headache, unspecified: Secondary | ICD-10-CM

## 2019-01-23 DIAGNOSIS — G8929 Other chronic pain: Secondary | ICD-10-CM

## 2019-01-23 DIAGNOSIS — K567 Ileus, unspecified: Secondary | ICD-10-CM | POA: Diagnosis present

## 2019-01-23 DIAGNOSIS — Z8041 Family history of malignant neoplasm of ovary: Secondary | ICD-10-CM | POA: Diagnosis not present

## 2019-01-23 DIAGNOSIS — K56609 Unspecified intestinal obstruction, unspecified as to partial versus complete obstruction: Secondary | ICD-10-CM | POA: Diagnosis not present

## 2019-01-23 DIAGNOSIS — I10 Essential (primary) hypertension: Secondary | ICD-10-CM | POA: Diagnosis present

## 2019-01-23 DIAGNOSIS — Z8541 Personal history of malignant neoplasm of cervix uteri: Secondary | ICD-10-CM | POA: Diagnosis not present

## 2019-01-23 DIAGNOSIS — K219 Gastro-esophageal reflux disease without esophagitis: Secondary | ICD-10-CM | POA: Diagnosis not present

## 2019-01-23 DIAGNOSIS — M542 Cervicalgia: Secondary | ICD-10-CM | POA: Diagnosis present

## 2019-01-23 DIAGNOSIS — Z882 Allergy status to sulfonamides status: Secondary | ICD-10-CM

## 2019-01-23 DIAGNOSIS — Z79899 Other long term (current) drug therapy: Secondary | ICD-10-CM

## 2019-01-23 DIAGNOSIS — Z8371 Family history of colonic polyps: Secondary | ICD-10-CM | POA: Diagnosis not present

## 2019-01-23 DIAGNOSIS — Z87820 Personal history of traumatic brain injury: Secondary | ICD-10-CM | POA: Diagnosis not present

## 2019-01-23 DIAGNOSIS — F4310 Post-traumatic stress disorder, unspecified: Secondary | ICD-10-CM | POA: Diagnosis present

## 2019-01-23 DIAGNOSIS — R109 Unspecified abdominal pain: Secondary | ICD-10-CM

## 2019-01-23 DIAGNOSIS — Z7951 Long term (current) use of inhaled steroids: Secondary | ICD-10-CM | POA: Diagnosis not present

## 2019-01-23 DIAGNOSIS — Z888 Allergy status to other drugs, medicaments and biological substances status: Secondary | ICD-10-CM

## 2019-01-23 DIAGNOSIS — G40909 Epilepsy, unspecified, not intractable, without status epilepticus: Secondary | ICD-10-CM | POA: Diagnosis present

## 2019-01-23 DIAGNOSIS — F17210 Nicotine dependence, cigarettes, uncomplicated: Secondary | ICD-10-CM | POA: Diagnosis present

## 2019-01-23 DIAGNOSIS — Z20828 Contact with and (suspected) exposure to other viral communicable diseases: Secondary | ICD-10-CM | POA: Diagnosis present

## 2019-01-23 LAB — CBC
HCT: 48.6 % — ABNORMAL HIGH (ref 36.0–46.0)
Hemoglobin: 16.4 g/dL — ABNORMAL HIGH (ref 12.0–15.0)
MCH: 30.1 pg (ref 26.0–34.0)
MCHC: 33.7 g/dL (ref 30.0–36.0)
MCV: 89.3 fL (ref 80.0–100.0)
Platelets: 345 10*3/uL (ref 150–400)
RBC: 5.44 MIL/uL — ABNORMAL HIGH (ref 3.87–5.11)
RDW: 13 % (ref 11.5–15.5)
WBC: 14.3 10*3/uL — ABNORMAL HIGH (ref 4.0–10.5)
nRBC: 0 % (ref 0.0–0.2)

## 2019-01-23 LAB — URINALYSIS, COMPLETE (UACMP) WITH MICROSCOPIC
Bacteria, UA: NONE SEEN
Bilirubin Urine: NEGATIVE
Glucose, UA: NEGATIVE mg/dL
Ketones, ur: NEGATIVE mg/dL
Leukocytes,Ua: NEGATIVE
Nitrite: NEGATIVE
Protein, ur: 30 mg/dL — AB
Specific Gravity, Urine: 1.017 (ref 1.005–1.030)
pH: 7 (ref 5.0–8.0)

## 2019-01-23 LAB — COMPREHENSIVE METABOLIC PANEL
ALT: 14 U/L (ref 0–44)
AST: 26 U/L (ref 15–41)
Albumin: 4.8 g/dL (ref 3.5–5.0)
Alkaline Phosphatase: 115 U/L (ref 38–126)
Anion gap: 15 (ref 5–15)
BUN: 16 mg/dL (ref 6–20)
CO2: 24 mmol/L (ref 22–32)
Calcium: 10 mg/dL (ref 8.9–10.3)
Chloride: 101 mmol/L (ref 98–111)
Creatinine, Ser: 0.81 mg/dL (ref 0.44–1.00)
GFR calc Af Amer: >60 mL/min (ref >60)
GFR calc non Af Amer: >60 mL/min (ref >60)
Glucose, Bld: 145 mg/dL — ABNORMAL HIGH (ref 70–99)
Potassium: 4.6 mmol/L (ref 3.5–5.1)
Sodium: 140 mmol/L (ref 135–145)
Total Bilirubin: 0.7 mg/dL (ref 0.3–1.2)
Total Protein: 8.7 g/dL — ABNORMAL HIGH (ref 6.5–8.1)

## 2019-01-23 LAB — PREGNANCY, URINE: Preg Test, Ur: NEGATIVE

## 2019-01-23 LAB — GLUCOSE, CAPILLARY: Glucose-Capillary: 158 mg/dL — ABNORMAL HIGH (ref 70–99)

## 2019-01-23 LAB — LIPASE, BLOOD: Lipase: 30 U/L (ref 11–51)

## 2019-01-23 MED ORDER — SODIUM CHLORIDE 0.9% FLUSH: 3.0000 mL | Freq: Once | INTRAVENOUS | Status: DC

## 2019-01-23 MED ORDER — SODIUM CHLORIDE 0.9 % IV SOLN
Freq: Once | INTRAVENOUS | Status: AC
  Administered 2019-01-23: 16:00:00 via INTRAVENOUS

## 2019-01-23 MED ORDER — ONDANSETRON HCL 4 MG/2ML IJ SOLN
INTRAMUSCULAR | Status: AC
  Filled 2019-01-23: qty 2

## 2019-01-23 MED ORDER — ONDANSETRON HCL 4 MG/2ML IJ SOLN
4.0000 mg | INTRAMUSCULAR | Status: DC | PRN
  Administered 2019-01-24 – 2019-01-25 (×3): 4 mg via INTRAVENOUS
  Filled 2019-01-23 (×4): qty 2

## 2019-01-23 MED ORDER — LABETALOL HCL 5 MG/ML IV SOLN
10.0000 mg | Freq: Four times a day (QID) | INTRAVENOUS | Status: DC | PRN
  Administered 2019-01-23 – 2019-01-24 (×2): 10 mg via INTRAVENOUS
  Filled 2019-01-23 (×4): qty 4

## 2019-01-23 MED ORDER — ONDANSETRON HCL 4 MG/2ML IJ SOLN
4.0000 mg | Freq: Once | INTRAMUSCULAR | Status: AC
  Administered 2019-01-23: 16:00:00 4 mg via INTRAVENOUS
  Filled 2019-01-23: qty 2

## 2019-01-23 MED ORDER — ONDANSETRON HCL 4 MG/2ML IJ SOLN
4.0000 mg | Freq: Once | INTRAMUSCULAR | Status: AC | PRN
  Administered 2019-01-23: 13:00:00 4 mg via INTRAVENOUS
  Filled 2019-01-23: qty 2

## 2019-01-23 MED ORDER — HYDROMORPHONE HCL 1 MG/ML IJ SOLN
0.5000 mg | Freq: Once | INTRAMUSCULAR | Status: AC
  Administered 2019-01-23: 20:00:00 0.5 mg via INTRAVENOUS
  Filled 2019-01-23: qty 1

## 2019-01-23 MED ORDER — MORPHINE SULFATE (PF) 4 MG/ML IV SOLN
4.0000 mg | Freq: Once | INTRAVENOUS | Status: AC
  Administered 2019-01-23: 16:00:00 4 mg via INTRAVENOUS
  Filled 2019-01-23: qty 1

## 2019-01-23 MED ORDER — LORAZEPAM 2 MG/ML IJ SOLN: 1.0000 mg | Freq: Once | INTRAMUSCULAR | Status: DC | PRN

## 2019-01-23 MED ORDER — LABETALOL HCL 5 MG/ML IV SOLN
20.0000 mg | INTRAVENOUS | Status: DC | PRN
  Administered 2019-01-23 – 2019-01-24 (×2): 20 mg via INTRAVENOUS
  Filled 2019-01-23 (×2): qty 4

## 2019-01-23 NOTE — H&P (Signed)
[facility] NAME: [name]    MR#:  [id number]  DATE OF BIRTH:  [birth year]  DATE OF ADMISSION:  01/23/2019  PRIMARY CARE PHYSICIAN: Remi Haggard, FNP   REQUESTING/REFERRING PHYSICIAN: Dr. Lenise Arena  Patient coming from : home. No family in the ER   CHIEF COMPLAINT:  abdominal pain since yesterday. Vomiting since today  HISTORY OF PRESENT ILLNESS:  [name]  is a 53 y.o. female with a known history of depression, history of seizure, Jerrye Bushy, traumatic brain injury in 2009, chronic neck pain, history of cervical cancer comes to the emergency room with complaints of abdominal pain which started yesterday. Patient said he started all of a sudden she could not stay comfortable. It would come and go. Was passing gas yesterday. Started vomiting this morning. She is not able to give me much details because she is hurting in her belly and a bit irritable when I'm asking questions.  In the ER, she is afebrile her blood pressure is 184/103. She denies history of hypertension. CT abdomen/renal study was done which showedThere multiple dilated loops of small bowel to the level of the terminal ileum. The colon is not distended. Patient was seen by surgery Dr. Christian Mate and requesting internal medicine admit patient  Patient is being admitted for further evaluation management of her SBO. She denies any abdominal surgery in the past. pt has history of constipation does not remember when she had the last bowel movement  PAST MEDICAL HISTORY:   Past Medical History:  Diagnosis Date  . BRBPR (bright red blood per rectum)   . Breast mass, left   . Cancer (HCC)    CERVICAL  . Depression   . Falls    EASILY  . GERD (gastroesophageal reflux disease)   . PTSD (post-traumatic stress disorder)   . Seizures ([place])   . 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 TBI (traumatic brain injury) ([place])    SKULL FX 2009  .  Vertigo   . Vertigo     PAST SURGICAL HISTOIRY:   Past Surgical History:  Procedure Laterality Date  . abd pain    . BACK SURGERY     LUMBAR FUSIONS X 2  . BREAST SURGERY    . CATARACT EXTRACTION W/PHACO Right 01/07/2015   Procedure: CATARACT EXTRACTION PHACO AND INTRAOCULAR LENS PLACEMENT (IOC);  Surgeon: Birder Robson, MD;  Location: [facility] ORS;  Service: Ophthalmology;  Laterality: Right;  [place] 0 AP% 0 CDE 0 fluid pack lot [id number]  . CATARACT EXTRACTION W/PHACO Left 01/28/2015   Procedure: CATARACT EXTRACTION PHACO AND INTRAOCULAR LENS PLACEMENT (IOC);  Surgeon: Birder Robson, MD;  Location: [facility] ORS;  Service: Ophthalmology;  Laterality: Left;  [place] 00:18 AP%7.1 CDE1.32 fluid pack lot # [id number]  . LEEP      SOCIAL HISTORY:   Social History   Tobacco Use  . Smoking status: Current Every Day Smoker    Packs/day: 1.00    Types: Cigarettes  . Smokeless tobacco: Never Used  Substance Use Topics  . Alcohol use: No    FAMILY HISTORY:   Family History  Problem Relation Age of Onset  . Ovarian cancer Maternal Aunt        40's    DRUG ALLERGIES:   Allergies  Allergen Reactions  . Contrast Media [Iodinated Diagnostic Agents]   . Diphenhydramine Hcl Hives and Itching  . Adhesive [Tape]  PAPER TAPE OK  . Iodine Other (See Comments)  . Prednisone Anxiety  . Sulfa Antibiotics Rash    REVIEW OF SYSTEMS:  Review of Systems  Constitutional: Negative for chills, fever and weight loss.  HENT: Negative for ear discharge, ear pain and nosebleeds.   Eyes: Negative for blurred vision, pain and discharge.  Respiratory: Negative for sputum production, shortness of breath, wheezing and stridor.   Cardiovascular: Negative for chest pain, palpitations, orthopnea and PND.  Gastrointestinal: Positive for abdominal pain, heartburn and vomiting. Negative for diarrhea and nausea.  Genitourinary: Negative for frequency and urgency.  Musculoskeletal: Positive for neck pain.  Negative for back pain and joint pain.  Neurological: Negative for sensory change, speech change, focal weakness and weakness.  Psychiatric/Behavioral: Negative for depression and hallucinations. The patient is not nervous/anxious.      MEDICATIONS AT HOME:   Prior to Admission medications   Medication Sig Start Date End Date Taking? Authorizing Provider  carisoprodol (SOMA) 350 MG tablet Take 700 mg by mouth 2 (two) times daily.    Yes [provider]  fexofenadine (ALLEGRA) 180 MG tablet Take 180 mg by mouth daily.    Yes [provider]  gabapentin (NEURONTIN) 300 MG capsule Take 300 mg by mouth 3 (three) times daily. 11/13/18  Yes [provider]  albuterol (PROVENTIL HFA;VENTOLIN HFA) 108 (90 Base) MCG/ACT inhaler INL 2 PFS PO Q 4 TO 6 H IF NEEDED 05/04/17   [provider]  atorvastatin (LIPITOR) 40 MG tablet Take 40 mg by mouth daily. 10/03/18   [provider]  desvenlafaxine (PRISTIQ) 100 MG 24 hr tablet Take 100 mg by mouth daily.    [provider]  desvenlafaxine (PRISTIQ) 50 MG 24 hr tablet TK 1 T PO ONCE D 06/05/17   [provider]  lamoTRIgine (LAMICTAL) 150 MG tablet Take 1/2 tab in the morning and 1 tab at night for two weeks then increase to 1 tab twice a day and continue that dose 06/20/17   [provider]  Levomilnacipran HCl ER ([place]) 80 MG CP24 Take by mouth.    [provider]  omeprazole (PRILOSEC) 40 MG capsule Take 40 mg by mouth daily.    [provider]  oxyCODONE (OXY IR/ROXICODONE) 5 MG immediate release tablet TK 1 T PO BID PRN 08/01/17   [provider]  SPIRIVA RESPIMAT 2.5 MCG/ACT AERS INL 2 PFS PO ONCE D 06/17/17   [provider]      VITAL SIGNS:  Blood pressure (!) 171/101, pulse 74, temperature 98.7 F (37.1 C), temperature source Oral, resp. rate (!) 22, weight 58 kg, SpO2 98 %.  PHYSICAL EXAMINATION:  GENERAL:  53 y.o.-year-old patient lying in  the bed with moderate acute distress. Due to abdominal pain EYES: Pupils equal, round, reactive to light and accommodation. No scleral icterus. Extraocular muscles intact.  HEENT: Head atraumatic, normocephalic. Oropharynx and nasopharynx clear.  NECK:  Supple, no jugular venous distention. No thyroid enlargement, no tenderness.  LUNGS: Normal breath sounds bilaterally, no wheezing, rales,rhonchi or crepitation. No use of accessory muscles of respiration.  CARDIOVASCULAR: S1, S2 normal. No murmurs, rubs, or gallops.  ABDOMEN: Soft, diffuse tender, nondistended. Few bowel sounds present. No organomegaly or mass. Admitted exam since patient irritable and not too comfortable EXTREMITIES: No pedal edema, cyanosis, or clubbing.  NEUROLOGIC: Cranial nerves II through XII are intact. Muscle strength 5/5 in all extremities. Sensation intact. Gait not checked.  PSYCHIATRIC: The patient is alert and oriented  x 3. Somewhat irritable SKIN: No obvious rash, lesion, or ulcer.   LABORATORY PANEL:   CBC Recent Labs  Lab 01/23/19 1310  WBC 14.3*  HGB 16.4*  HCT 48.6*  PLT 345   ------------------------------------------------------------------------------------------------------------------  Chemistries  Recent Labs  Lab 01/23/19 1310  NA 140  K 4.6  CL 101  CO2 24  GLUCOSE 145*  BUN 16  CREATININE 0.81  CALCIUM 10.0  AST 26  ALT 14  ALKPHOS 115  BILITOT 0.7   ------------------------------------------------------------------------------------------------------------------  Cardiac Enzymes No results for input(s): TROPONINI in the last 168 hours. ------------------------------------------------------------------------------------------------------------------  RADIOLOGY:  Ct Renal Stone Study  Result Date: 01/23/2019 CLINICAL DATA:  Abdominal pain and vomiting. EXAM: CT ABDOMEN AND PELVIS WITHOUT CONTRAST TECHNIQUE: Multidetector CT imaging of the abdomen and pelvis was performed  following the standard protocol without IV contrast. COMPARISON:  None. FINDINGS: Lower chest: Small hiatal hernia. Otherwise negative. Hepatobiliary: No focal liver abnormality is seen. No gallstones, gallbladder wall thickening, or biliary dilatation. Pancreas: Unremarkable. No pancreatic ductal dilatation or surrounding inflammatory changes. Spleen: Normal in size without focal abnormality. Adrenals/Urinary Tract: Adrenal glands and kidneys appear normal. No hydronephrosis. Bladder appears normal. Vascular calcification in the hilum of the left kidney. Stomach/Bowel: There multiple dilated loops of small bowel to the level of the terminal ileum. The colon is not distended. Appendix appears normal. Stomach is normal except for a small hiatal hernia. Vascular/Lymphatic: Aortic atherosclerosis. No enlarged abdominal or pelvic lymph nodes. Reproductive: Uterus and bilateral adnexa are unremarkable. Other: No abdominal wall hernia or abnormality. No abdominopelvic ascites. Musculoskeletal: No acute bone abnormality. Previous lower lumbar fusion. IMPRESSION: Small bowel obstruction at the level of the terminal ileum. Electronically Signed   By: Lorriane Shire M.D.   On: 01/23/2019 16:22    EKG:    IMPRESSION AND PLAN:    Janaija Pisani  is a 53 y.o. female with a known history of depression, history of seizure, Jerrye Bushy, traumatic brain injury in 2009, chronic neck pain, history of cervical cancer comes to the emergency room with complaints of abdominal pain which started yesterday. Patient said he started all of a sudden she could not stay comfortable. It would come and go. Was passing gas yesterday. Started vomiting this morning.   1. Small bowel obstruction/ileus -patient presented with abdominal pain and vomiting pain started yesterday vomiting this morning. -Admit to medical floor -NPO -IV fluids -NG tube to be placed -patient was seen by surgery in the ER -IV Toradol and morphine PRN  2. Uncontrolled  blood pressure without diagnosis of hypertension in the setting of abdominal pain -PRN IV labetalol -if persistently stays elevated consider clonidine patches since patient will be NPO and add oral meds as required  3. Chronic pain/cervicalagia -patient takes soma at home -she also is on gabapentin-- will resume once able to  4. Gerd -IV Protonix daily  5. DVT prophylaxis -subcu heparin  Family Communication: spoke with patient's son Rodman Key Maiers on the phone Consults: surgery Code Status: full DVT prophylaxis: heparin  TOTAL TIME TAKING CARE OF THIS PATIENT: *50* minutes.    Fritzi Mandes M.D on 01/23/2019 at 5:40 PM  Between 7am to 6pm - Pager - [phone]  After 6pm go to [email or web address] - password [email or web address] [facility]    CC: Primary care physician; Remi Haggard, FNP

## 2019-01-23 NOTE — ED Notes (Signed)
Dr. Jimmye Norman verified NG placement.

## 2019-01-23 NOTE — ED Notes (Addendum)
Dr. Sidney Ace gave this RN a verbal order for IV labetalol 20mg  q3hr prn for SBP >160 and DBP >100

## 2019-01-23 NOTE — Consult Note (Signed)
Patient ID: [name], female   DOB: [birth year], 53 y.o.   MRN: [id number]  Chief Complaint: Abdominal pain, nausea and vomiting x1 day  History of Present Illness [name] is a 53 y.o. female with CT findings consistent with distal ileal small bowel obstruction. Patient appears to be either disinterested in her history/or somewhat challenged or distracted in other ways. It appears that she has had a short history of upper abdominal pain primarily left-sided with associated vomiting of phlegm/bile. She reports a history of traumatic brain injury. Her pains have been somewhat crampy in nature. She has lost track of her bowel movements, but notes that she has had small liquid movements today. No known fevers or chills, no prior abdominal surgery reported.  Past Medical History Past Medical History:  Diagnosis Date  . BRBPR (bright red blood per rectum)   . Breast mass, left   . Cancer (HCC)    CERVICAL  . Depression   . Falls    EASILY  . GERD (gastroesophageal reflux disease)   . PTSD (post-traumatic stress disorder)   . Seizures ([place])   . 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 TBI (traumatic brain injury) ([place])    SKULL FX 2009  . Vertigo   . Vertigo       Past Surgical History:  Procedure Laterality Date  . abd pain    . BACK SURGERY     LUMBAR FUSIONS X 2  . BREAST SURGERY    . CATARACT EXTRACTION W/PHACO Right 01/07/2015   Procedure: CATARACT EXTRACTION PHACO AND INTRAOCULAR LENS PLACEMENT (IOC);  Surgeon: Birder Robson, MD;  Location: [facility] ORS;  Service: Ophthalmology;  Laterality: Right;  [place] 0 AP% 0 CDE 0 fluid pack lot [id number]  . CATARACT EXTRACTION W/PHACO Left 01/28/2015   Procedure: CATARACT EXTRACTION PHACO AND INTRAOCULAR LENS PLACEMENT (IOC);  Surgeon: Birder Robson, MD;  Location: [facility] ORS;  Service: Ophthalmology;  Laterality: Left;  [place] 00:18 AP%7.1 CDE1.32 fluid pack lot # [id number]  . LEEP      Allergies  Allergen  Reactions  . Contrast Media [Iodinated Diagnostic Agents]   . Diphenhydramine Hcl Hives and Itching  . Adhesive [Tape]     PAPER TAPE OK  . Iodine Other (See Comments)  . Prednisone Anxiety  . Sulfa Antibiotics Rash    Current Facility-Administered Medications  Medication Dose Route Frequency Provider Last Rate Last Dose  . meloxicam (MOBIC) tablet 7.5 mg  7.5 mg Oral Daily Jessy Oto, MD      . sodium chloride flush (NS) 0.9 % injection 3 mL  3 mL Intravenous Once Vanessa Corinne, MD       Current Outpatient Medications  Medication Sig Dispense Refill  . albuterol (PROVENTIL HFA;VENTOLIN HFA) 108 (90 Base) MCG/ACT inhaler INL 2 PFS PO Q 4 TO 6 H IF NEEDED  2  . ARIPiprazole (ABILIFY) 5 MG tablet Take by mouth.    . baclofen (LIORESAL) 10 MG tablet TAKE 1 TABLET(10 MG) BY MOUTH TWICE DAILY 30 tablet 0  . carisoprodol (SOMA) 350 MG tablet Take 1 to 3 by mouth times daily    . desvenlafaxine (PRISTIQ) 100 MG 24 hr tablet Take 100 mg by mouth daily.    Marland Kitchen desvenlafaxine (PRISTIQ) 50 MG 24 hr tablet TK 1 T PO ONCE D  1  . diazepam (VALIUM) 2 MG tablet Take at the MRI and may repeat once for anxiety due to MRI. 2  tablet 0  . diclofenac sodium (VOLTAREN) 1 % GEL Apply 4 g topically 4 (four) times daily. 5 Tube 3  . fexofenadine (ALLEGRA) 180 MG tablet Take 180 mg by mouth daily.     Marland Kitchen gabapentin (NEURONTIN) 300 MG capsule TK 1 C PO BID FOR 1 WEEK. INCREASE TO 300 MG 3 TIMES A DAY AND. CONT THAT DOSE  3  . lamoTRIgine (LAMICTAL) 150 MG tablet Take 1/2 tab in the morning and 1 tab at night for two weeks then increase to 1 tab twice a day and continue that dose    . Levomilnacipran HCl ER (FETZIMA) 80 MG CP24 Take by mouth.    Marland Kitchen omeprazole (PRILOSEC) 40 MG capsule Take 40 mg by mouth daily.    . ondansetron (ZOFRAN) 4 MG tablet Take 1 tablet (4 mg total) by mouth every 8 (eight) hours as needed for nausea or vomiting. 20 tablet 0  . oxyCODONE (OXY IR/ROXICODONE) 5 MG immediate release tablet  TK 1 T PO BID PRN  0  . oxyCODONE-acetaminophen (PERCOCET) 5-325 MG tablet Take 1 tablet by mouth every 8 (eight) hours as needed. 6 tablet 0  . promethazine-phenylephrine (PROMETHAZINE VC) 6.25-5 MG/5ML SYRP Take 5 mLs by mouth every 4 (four) hours as needed for congestion. 280 mL 0  . REXULTI 2 MG TABS   0  . SPIRIVA RESPIMAT 2.5 MCG/ACT AERS INL 2 PFS PO ONCE D  2    Family History Family History  Problem Relation Age of Onset  . Ovarian cancer Maternal Aunt        40's      Social History Social History   Tobacco Use  . Smoking status: Current Every Day Smoker    Packs/day: 1.00    Types: Cigarettes  . Smokeless tobacco: Never Used  Substance Use Topics  . Alcohol use: No  . Drug use: Yes    Types: Marijuana        Review of Systems  Unable to perform ROS: Mental acuity      Physical Exam Blood pressure (!) 171/101, pulse 74, temperature 98.7 F (37.1 C), temperature source Oral, resp. rate (!) 22, weight 58 kg, SpO2 98 %. Last Weight  Most recent update: 01/23/2019  1:04 PM   Weight  58 kg (127 lb 13.9 oz)            CONSTITUTIONAL: Well developed, and nourished, minimally responsive and aware without evidence of distress.   EYES: Sclera non-icteric.   EARS, NOSE, MOUTH AND THROAT: Oral mucosa is pink and moist. Hearing is intact to voice.  NECK: Trachea is midline, and there is no jugular venous distension.  LYMPH NODES:  Lymph nodes in the neck are not enlarged. RESPIRATORY:  Lungs are clear, and breath sounds are equal bilaterally. Normal respiratory effort without pathologic use of accessory muscles. CARDIOVASCULAR: Heart is regular in rate and rhythm. GI: The abdomen is free of any scars, there is a navel ring it is otherwise soft, nontender, and nondistended. There were no palpable masses. I did not appreciate hepatosplenomegaly. There were bowel sounds. MUSCULOSKELETAL:  Symmetrical muscle tone appreciated in all four extremities.    SKIN: Skin  turgor is normal. No pathologic skin lesions appreciated.  NEUROLOGIC: exam, grossly nonfocal. PSYCH: Seemingly somnolent, affect is flat without evidence of any distress..  Data Reviewed I have personally reviewed what is currently available of the patient's imaging, recent labs and medical records.     Labs Reviewed  COMPREHENSIVE METABOLIC PANEL -  Abnormal; Notable for the following components:      Result Value   Glucose, Bld 145 (*)    Total Protein 8.7 (*)    All other components within normal limits  CBC - Abnormal; Notable for the following components:   WBC 14.3 (*)    RBC 5.44 (*)    Hemoglobin 16.4 (*)    HCT 48.6 (*)    All other components within normal limits  LIPASE, BLOOD  URINALYSIS, COMPLETE (UACMP) WITH MICROSCOPIC  POC URINE PREG, ED     Assessment    Unusual presentation of virgin abdomen with distal ileal small bowel obstruction. Suspect this is nonmechanical in nature, and further evaluation with proximal relief from decompression may be helpful with obtaining additional examinations either with contrast or with repeat CT scan. Patient Active Problem List   Diagnosis Date Noted  . Vertigo 07/11/2017  . Generalized abdominal pain 11/15/2016  . FH: colon polyps 11/15/2016  . Elevated alkaline phosphatase level 11/15/2016  . BRBPR (bright red blood per rectum) 11/15/2016  . Other dysphagia 11/15/2016  . Other constipation 11/15/2016  . PTSD (post-traumatic stress disorder) 05/18/2016  . Breast mass, left 01/08/2016  . Anxiety and depression 01/08/2016  . Chronic pain 01/08/2016  . Seizure disorder ([place]) 01/08/2016  . GERD (gastroesophageal reflux disease) 01/08/2016  . Difficulty sleeping 08/05/2015  . Mood disorder ([place]) 05/05/2015  . Tobacco abuse counseling 04/15/2015  . Crushing injury of neck 09/27/2011  . Head injury 05/06/2011    Plan    No imminent surgical plan for intervention. Suspect nasogastric tube decompression, IV fluid  resuscitation and bowel rest may resolve this presentation. We will follow along to determine additional work-up as needed. Repeat CBC in the morning.  Face-to-face time spent with the patient and accompanying care providers(if present) was 30 minutes, with more than 50% of the time spent counseling, educating, and coordinating care of the patient.      Ronny Bacon 01/23/2019, 5:17 PM

## 2019-01-23 NOTE — ED Notes (Signed)
Per Dr. Sidney Ace ok for pt to have ice chips

## 2019-01-23 NOTE — ED Notes (Signed)
Pt's BP elevated at 178/112 and pt reports headache. Per [facility] pt received IV labetalol at 1843, message sent to Dr. Posey Pronto regarding this. Pt appears diaphoretic on forehead and face, blood glucose checked and is 158

## 2019-01-23 NOTE — ED Notes (Signed)
Pt yelling in triage- stating that she can not sit up- pt refusing EKG- states that she "must lay down".

## 2019-01-23 NOTE — ED Notes (Signed)
18Fr NG tube inserted in right nares by Nira Conn RN. KUB ordered for placement.

## 2019-01-23 NOTE — ED Notes (Signed)
Pt does not provide very much information to RN for assessment.

## 2019-01-23 NOTE — ED Notes (Signed)
States she cannot provider urine sample at this time. Encouraged to provide one and let RN know if able to.

## 2019-01-23 NOTE — ED Triage Notes (Signed)
PT reports that she began today having abd pain and vomiting. Pt is extremely difficult to obtain information from- pt yelling in triage.

## 2019-01-23 NOTE — ED Notes (Addendum)
Report to [place], Therapist, sports. Pt moved to room 15. SURGEON at bedside.

## 2019-01-23 NOTE — ED Notes (Signed)
Report given to Gracie RN.

## 2019-01-23 NOTE — ED Provider Notes (Signed)
[facility] Emergency Department Provider Note       Time seen: ----------------------------------------- 3:59 PM on 01/23/2019 -----------------------------------------   I have reviewed the triage vital signs and the nursing notes.  HISTORY   Chief Complaint Abdominal Pain and Emesis   HPI [name] is a 53 y.o. female with a history of cervical cancer, depression, PTSD, seizures, TBI who presents to the ED for abdominal pain and vomiting.  Patient states the pain is mostly in the left abdomen, 10-10, nothing makes it better or worse.  She has had a slight amount of loose stool but no specific diarrhea.  Past Medical History:  Diagnosis Date  . BRBPR (bright red blood per rectum)   . Breast mass, left   . Cancer (HCC)    CERVICAL  . Depression   . Falls    EASILY  . GERD (gastroesophageal reflux disease)   . PTSD (post-traumatic stress disorder)   . Seizures ([place])   . 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 TBI (traumatic brain injury) ([place])    SKULL FX 2009  . Vertigo   . Vertigo     Patient Active Problem List   Diagnosis Date Noted  . Vertigo 07/11/2017  . Generalized abdominal pain 11/15/2016  . FH: colon polyps 11/15/2016  . Elevated alkaline phosphatase level 11/15/2016  . BRBPR (bright red blood per rectum) 11/15/2016  . Other dysphagia 11/15/2016  . Other constipation 11/15/2016  . PTSD (post-traumatic stress disorder) 05/18/2016  . Breast mass, left 01/08/2016  . Anxiety and depression 01/08/2016  . Chronic pain 01/08/2016  . Seizure disorder ([place]) 01/08/2016  . GERD (gastroesophageal reflux disease) 01/08/2016  . Difficulty sleeping 08/05/2015  . Mood disorder ([place]) 05/05/2015  . Tobacco abuse counseling 04/15/2015  . Crushing injury of neck 09/27/2011  . Head injury 05/06/2011    Past Surgical History:  Procedure Laterality Date  . abd pain    . BACK SURGERY     LUMBAR FUSIONS X 2  . BREAST  SURGERY    . CATARACT EXTRACTION W/PHACO Right 01/07/2015   Procedure: CATARACT EXTRACTION PHACO AND INTRAOCULAR LENS PLACEMENT (IOC);  Surgeon: Birder Robson, MD;  Location: [facility] ORS;  Service: Ophthalmology;  Laterality: Right;  [place] 0 AP% 0 CDE 0 fluid pack lot [id number]  . CATARACT EXTRACTION W/PHACO Left 01/28/2015   Procedure: CATARACT EXTRACTION PHACO AND INTRAOCULAR LENS PLACEMENT (IOC);  Surgeon: Birder Robson, MD;  Location: [facility] ORS;  Service: Ophthalmology;  Laterality: Left;  [place] 00:18 AP%7.1 CDE1.32 fluid pack lot # [id number]  . LEEP      Allergies Contrast media [iodinated diagnostic agents], Diphenhydramine hcl, Adhesive [tape], Iodine, Prednisone, and Sulfa antibiotics  Social History Social History   Tobacco Use  . Smoking status: Current Every Day Smoker    Packs/day: 1.00    Types: Cigarettes  . Smokeless tobacco: Never Used  Substance Use Topics  . Alcohol use: No  . Drug use: Yes    Types: Marijuana   Review of Systems Constitutional: Negative for fever. Cardiovascular: Negative for chest pain. Respiratory: Negative for shortness of breath. Gastrointestinal: Positive for abdominal pain, vomiting Musculoskeletal: Negative for back pain. Skin: Negative for rash. Neurological: Negative for headaches, focal weakness or numbness.  All systems negative/normal/unremarkable except as stated in the HPI  ____________________________________________   PHYSICAL EXAM:  VITAL SIGNS: ED Triage Vitals  Enc Vitals Group     BP 01/23/19 1303 (!) 184/103  Pulse Rate 01/23/19 1303 (!) 101     Resp 01/23/19 1303 (!) 24     Temp 01/23/19 1303 98.7 F (37.1 C)     Temp Source 01/23/19 1303 Oral     SpO2 01/23/19 1303 99 %     Weight 01/23/19 1304 127 lb 13.9 oz (58 kg)     Height --      Head Circumference --      Peak Flow --      Pain Score 01/23/19 1303 10     Pain Loc --      Pain Edu? --      Excl. in [place]? --    Constitutional: Alert and  oriented.  Mild distress from pain Eyes: Conjunctivae are normal. Normal extraocular movements. ENT      Head: Normocephalic and atraumatic.      Nose: No congestion/rhinnorhea.      Mouth/Throat: Mucous membranes are moist.      Neck: No stridor. Cardiovascular: Normal rate, regular rhythm. No murmurs, rubs, or gallops. Respiratory: Normal respiratory effort without tachypnea nor retractions. Breath sounds are clear and equal bilaterally. No wheezes/rales/rhonchi. Gastrointestinal: Distended, high-pitched bowel sounds, left sided tenderness diffusely is noted Musculoskeletal: Nontender with normal range of motion in extremities. No lower extremity tenderness nor edema. Neurologic: No gross focal neurologic deficits are appreciated.  Skin:  Skin is warm, dry and intact. No rash noted. Psychiatric: Bizarre mood and affect at times ____________________________________________  ED COURSE:  As part of my medical decision making, I reviewed the following data within the [place] History obtained from family if available, nursing notes, old chart and ekg, as well as notes from prior ED visits. Patient presented for abdominal pain and vomiting, we will assess with labs and imaging as indicated at this time.   Procedures  [name] was evaluated in Emergency Department on 01/23/2019 for the symptoms described in the history of present illness. She was evaluated in the context of the global COVID-19 pandemic, which necessitated consideration that the patient might be at risk for infection with the SARS-CoV-2 virus that causes COVID-19. Institutional protocols and algorithms that pertain to the evaluation of patients at risk for COVID-19 are in a state of rapid change based on information released by regulatory bodies including the CDC and federal and state organizations. These policies and algorithms were followed during the patient's care in the ED.   ____________________________________________   LABS (pertinent positives/negatives)  Labs Reviewed  COMPREHENSIVE METABOLIC PANEL - Abnormal; Notable for the following components:      Result Value   Glucose, Bld 145 (*)    Total Protein 8.7 (*)    All other components within normal limits  CBC - Abnormal; Notable for the following components:   WBC 14.3 (*)    RBC 5.44 (*)    Hemoglobin 16.4 (*)    HCT 48.6 (*)    All other components within normal limits  LIPASE, BLOOD  URINALYSIS, COMPLETE (UACMP) WITH MICROSCOPIC  POC URINE PREG, ED    RADIOLOGY Images were viewed by me  CT renal protocol IMPRESSION: Small bowel obstruction at the level of the terminal ileum.  ____________________________________________   DIFFERENTIAL DIAGNOSIS   Renal colic, UTI, pyelonephritis, diverticulitis, obstruction, perforation  FINAL ASSESSMENT AND PLAN  Small bowel obstruction   Plan: The patient had presented for abdominal pain and vomiting. Patient's labs did reveal some leukocytosis. Patient's imaging revealed a small bowel obstruction at the level of the terminal  ileum.  I have discussed with general surgery on-call.  We will plan for NG tube placement.   Laurence Aly, MD    Note: This note was generated in part or whole with voice recognition software. Voice recognition is usually quite accurate but there are transcription errors that can and very often do occur. I apologize for any typographical errors that were not detected and corrected.     Earleen Newport, MD 01/23/19 [phone]

## 2019-01-23 NOTE — ED Notes (Signed)
Patient transported to CT 

## 2019-01-24 ENCOUNTER — Inpatient Hospital Stay: Payer: Medicare Other

## 2019-01-24 ENCOUNTER — Encounter
Admission: EM | Disposition: A | Discharge status: Home / Self Care | Payer: Self-pay | Source: Home / Self Care | Attending: Internal Medicine

## 2019-01-24 ENCOUNTER — Inpatient Hospital Stay: Payer: Medicare Other | Admitting: Certified Registered Nurse Anesthetist

## 2019-01-24 ENCOUNTER — Other Ambulatory Visit: Payer: Self-pay

## 2019-01-24 DIAGNOSIS — R519 Headache, unspecified: Secondary | ICD-10-CM

## 2019-01-24 DIAGNOSIS — R1010 Upper abdominal pain, unspecified: Secondary | ICD-10-CM

## 2019-01-24 DIAGNOSIS — I10 Essential (primary) hypertension: Secondary | ICD-10-CM

## 2019-01-24 DIAGNOSIS — K56609 Unspecified intestinal obstruction, unspecified as to partial versus complete obstruction: Secondary | ICD-10-CM

## 2019-01-24 HISTORY — PX: XI ROBOT ABDOMINAL PERINEAL RESECTION: SHX6709

## 2019-01-24 LAB — CREATININE, SERUM
Creatinine, Ser: 0.62 mg/dL (ref 0.44–1.00)
GFR calc Af Amer: >60 mL/min (ref >60)
GFR calc non Af Amer: >60 mL/min (ref >60)

## 2019-01-24 LAB — CBC
HCT: 47 % — ABNORMAL HIGH (ref 36.0–46.0)
Hemoglobin: 15.8 g/dL — ABNORMAL HIGH (ref 12.0–15.0)
MCH: 30.1 pg (ref 26.0–34.0)
MCHC: 33.6 g/dL (ref 30.0–36.0)
MCV: 89.5 fL (ref 80.0–100.0)
Platelets: 349 10*3/uL (ref 150–400)
RBC: 5.25 MIL/uL — ABNORMAL HIGH (ref 3.87–5.11)
RDW: 12.9 % (ref 11.5–15.5)
WBC: 20.4 10*3/uL — ABNORMAL HIGH (ref 4.0–10.5)
nRBC: 0 % (ref 0.0–0.2)

## 2019-01-24 LAB — SARS CORONAVIRUS 2 (TAT 6-24 HRS): SARS Coronavirus 2: NEGATIVE

## 2019-01-24 SURGERY — RESECTION, ABDOMINOPERINEAL, ROBOT-ASSISTED
Anesthesia: General | Site: Abdomen

## 2019-01-24 SURGERY — APPENDECTOMY, ROBOT-ASSISTED, LAPAROSCOPIC
Anesthesia: General

## 2019-01-24 MED ORDER — LABETALOL HCL 5 MG/ML IV SOLN
10.0000 mg | INTRAVENOUS | Status: DC
  Administered 2019-01-24 – 2019-01-28 (×15): 10 mg via INTRAVENOUS
  Filled 2019-01-24 (×16): qty 4

## 2019-01-24 MED ORDER — LIDOCAINE HCL (PF) 2 % IJ SOLN
INTRAMUSCULAR | Status: AC
  Filled 2019-01-24: qty 10

## 2019-01-24 MED ORDER — PANTOPRAZOLE SODIUM 40 MG IV SOLR
40.0000 mg | Freq: Two times a day (BID) | INTRAVENOUS | Status: DC
  Administered 2019-01-24 (×2): 40 mg via INTRAVENOUS
  Filled 2019-01-24 (×3): qty 40

## 2019-01-24 MED ORDER — FENTANYL CITRATE (PF) 250 MCG/5ML IJ SOLN
INTRAMUSCULAR | Status: AC
  Filled 2019-01-24: qty 5

## 2019-01-24 MED ORDER — ONDANSETRON HCL 4 MG/2ML IJ SOLN
INTRAMUSCULAR | Status: AC
  Administered 2019-01-24: 18:00:00 4 mg via INTRAVENOUS
  Filled 2019-01-24: qty 2

## 2019-01-24 MED ORDER — SODIUM CHLORIDE 0.9 % IV SOLN
1.0000 g | Freq: Once | INTRAVENOUS | Status: AC
  Administered 2019-01-24: 1 g via INTRAVENOUS
  Filled 2019-01-24: qty 1

## 2019-01-24 MED ORDER — ONDANSETRON HCL 4 MG/2ML IJ SOLN
INTRAMUSCULAR | Status: DC | PRN
  Administered 2019-01-24: 4 mg via INTRAVENOUS

## 2019-01-24 MED ORDER — KETOROLAC TROMETHAMINE 30 MG/ML IJ SOLN
30.0000 mg | Freq: Four times a day (QID) | INTRAMUSCULAR | Status: DC | PRN
  Administered 2019-01-24: 03:00:00 30 mg via INTRAVENOUS
  Filled 2019-01-24 (×2): qty 1

## 2019-01-24 MED ORDER — SUGAMMADEX SODIUM 200 MG/2ML IV SOLN
INTRAVENOUS | Status: DC | PRN
  Administered 2019-01-24: 150 mg via INTRAVENOUS

## 2019-01-24 MED ORDER — DEXAMETHASONE SODIUM PHOSPHATE 10 MG/ML IJ SOLN
INTRAMUSCULAR | Status: DC | PRN
  Administered 2019-01-24: 10 mg via INTRAVENOUS

## 2019-01-24 MED ORDER — PROMETHAZINE HCL 25 MG/ML IJ SOLN
6.2500 mg | INTRAMUSCULAR | Status: DC | PRN
  Administered 2019-01-24: 19:00:00 6.25 mg via INTRAVENOUS

## 2019-01-24 MED ORDER — MORPHINE SULFATE (PF) 2 MG/ML IV SOLN
2.0000 mg | INTRAVENOUS | Status: DC | PRN
  Administered 2019-01-24 – 2019-01-25 (×5): 2 mg via INTRAVENOUS
  Filled 2019-01-24 (×5): qty 1

## 2019-01-24 MED ORDER — ACETAMINOPHEN 325 MG PO TABS: 650.0000 mg | ORAL_TABLET | Freq: Four times a day (QID) | ORAL | Status: DC | PRN

## 2019-01-24 MED ORDER — BUPIVACAINE-EPINEPHRINE (PF) 0.25% -1:200000 IJ SOLN
INTRAMUSCULAR | Status: AC
  Filled 2019-01-24: qty 30

## 2019-01-24 MED ORDER — BUPIVACAINE-EPINEPHRINE (PF) 0.25% -1:200000 IJ SOLN
INTRAMUSCULAR | Status: DC | PRN
  Administered 2019-01-24: 10 mL

## 2019-01-24 MED ORDER — ACETAMINOPHEN 650 MG RE SUPP: 650.0000 mg | Freq: Four times a day (QID) | RECTAL | Status: DC | PRN

## 2019-01-24 MED ORDER — LACTATED RINGERS IV SOLN
INTRAVENOUS | Status: DC | PRN
  Administered 2019-01-24: 16:00:00 via INTRAVENOUS

## 2019-01-24 MED ORDER — MIDAZOLAM HCL 2 MG/2ML IJ SOLN
INTRAMUSCULAR | Status: AC
  Filled 2019-01-24: qty 2

## 2019-01-24 MED ORDER — DEXMEDETOMIDINE HCL 200 MCG/2ML IV SOLN
INTRAVENOUS | Status: DC | PRN
  Administered 2019-01-24: 4 ug via INTRAVENOUS
  Administered 2019-01-24 (×2): 8 ug via INTRAVENOUS

## 2019-01-24 MED ORDER — ONDANSETRON HCL 4 MG/2ML IJ SOLN
4.0000 mg | Freq: Once | INTRAMUSCULAR | Status: AC | PRN
  Administered 2019-01-24: 18:00:00 4 mg via INTRAVENOUS

## 2019-01-24 MED ORDER — FENTANYL CITRATE (PF) 100 MCG/2ML IJ SOLN
INTRAMUSCULAR | Status: DC | PRN
  Administered 2019-01-24: 50 ug via INTRAVENOUS
  Administered 2019-01-24: 100 ug via INTRAVENOUS

## 2019-01-24 MED ORDER — FENTANYL CITRATE (PF) 100 MCG/2ML IJ SOLN
25.0000 ug | INTRAMUSCULAR | Status: DC | PRN
  Administered 2019-01-24: 19:00:00 25 ug via INTRAVENOUS
  Administered 2019-01-24 (×2): 50 ug via INTRAVENOUS
  Administered 2019-01-24: 19:00:00 25 ug via INTRAVENOUS

## 2019-01-24 MED ORDER — ROCURONIUM BROMIDE 50 MG/5ML IV SOLN
INTRAVENOUS | Status: AC
  Filled 2019-01-24: qty 1

## 2019-01-24 MED ORDER — PROPOFOL 10 MG/ML IV BOLUS
INTRAVENOUS | Status: AC
  Filled 2019-01-24: qty 20

## 2019-01-24 MED ORDER — ACETAMINOPHEN 10 MG/ML IV SOLN
INTRAVENOUS | Status: DC | PRN
  Administered 2019-01-24: 1000 mg via INTRAVENOUS

## 2019-01-24 MED ORDER — FENTANYL CITRATE (PF) 100 MCG/2ML IJ SOLN
25.0000 ug | INTRAMUSCULAR | Status: AC | PRN
  Administered 2019-01-24 (×2): 25 ug via INTRAVENOUS

## 2019-01-24 MED ORDER — HEPARIN SODIUM (PORCINE) 5000 UNIT/ML IJ SOLN
5000.0000 [IU] | Freq: Three times a day (TID) | INTRAMUSCULAR | Status: DC
  Filled 2019-01-24 (×4): qty 1

## 2019-01-24 MED ORDER — LIDOCAINE HCL (CARDIAC) PF 100 MG/5ML IV SOSY
PREFILLED_SYRINGE | INTRAVENOUS | Status: DC | PRN
  Administered 2019-01-24: 80 mg via INTRAVENOUS

## 2019-01-24 MED ORDER — SUCCINYLCHOLINE CHLORIDE 20 MG/ML IJ SOLN
INTRAMUSCULAR | Status: DC | PRN
  Administered 2019-01-24: 80 mg via INTRAVENOUS

## 2019-01-24 MED ORDER — FENTANYL CITRATE (PF) 100 MCG/2ML IJ SOLN
INTRAMUSCULAR | Status: AC
  Administered 2019-01-24: 19:00:00 25 ug via INTRAVENOUS
  Filled 2019-01-24: qty 2

## 2019-01-24 MED ORDER — DIATRIZOATE MEGLUMINE & SODIUM 66-10 % PO SOLN: 90.0000 mL | Freq: Once | ORAL | Status: DC

## 2019-01-24 MED ORDER — POLYETHYLENE GLYCOL 3350 17 G PO PACK
17.0000 g | PACK | Freq: Every day | ORAL | Status: DC
  Administered 2019-01-25 – 2019-01-27 (×2): 17 g via ORAL
  Filled 2019-01-24 (×2): qty 1

## 2019-01-24 MED ORDER — ROCURONIUM BROMIDE 100 MG/10ML IV SOLN
INTRAVENOUS | Status: DC | PRN
  Administered 2019-01-24: 30 mg via INTRAVENOUS
  Administered 2019-01-24: 50 mg via INTRAVENOUS
  Administered 2019-01-24: 20 mg via INTRAVENOUS

## 2019-01-24 MED ORDER — HYDROCODONE-ACETAMINOPHEN 5-325 MG PO TABS
1.0000 | ORAL_TABLET | ORAL | Status: DC | PRN
  Administered 2019-01-25 (×2): 1 via ORAL
  Filled 2019-01-24 (×3): qty 1

## 2019-01-24 MED ORDER — ONDANSETRON HCL 4 MG/2ML IJ SOLN
INTRAMUSCULAR | Status: AC
  Filled 2019-01-24: qty 2

## 2019-01-24 MED ORDER — SODIUM CHLORIDE 0.9 % IV SOLN
INTRAVENOUS | Status: DC
  Administered 2019-01-24 – 2019-01-26 (×6): via INTRAVENOUS

## 2019-01-24 MED ORDER — MORPHINE SULFATE (PF) 2 MG/ML IV SOLN
2.0000 mg | INTRAVENOUS | Status: DC | PRN
  Administered 2019-01-24 (×2): 2 mg via INTRAVENOUS
  Filled 2019-01-24 (×2): qty 1

## 2019-01-24 MED ORDER — MIDAZOLAM HCL 2 MG/2ML IJ SOLN
INTRAMUSCULAR | Status: DC | PRN
  Administered 2019-01-24: 2 mg via INTRAVENOUS

## 2019-01-24 MED ORDER — FENTANYL CITRATE (PF) 100 MCG/2ML IJ SOLN
INTRAMUSCULAR | Status: AC
  Administered 2019-01-24: 19:00:00 50 ug via INTRAVENOUS
  Filled 2019-01-24: qty 2

## 2019-01-24 MED ORDER — PROPOFOL 10 MG/ML IV BOLUS
INTRAVENOUS | Status: DC | PRN
  Administered 2019-01-24: 120 mg via INTRAVENOUS

## 2019-01-24 MED ORDER — PROMETHAZINE HCL 25 MG/ML IJ SOLN
INTRAMUSCULAR | Status: AC
  Administered 2019-01-24: 19:00:00 6.25 mg via INTRAVENOUS
  Filled 2019-01-24: qty 1

## 2019-01-24 SURGICAL SUPPLY — 51 items
CANISTER SUCT 1200ML W/VALVE (MISCELLANEOUS) ×4 IMPLANT
CANNULA REDUC XI 12-8 STAPL (CANNULA) ×1
CANNULA REDUC XI 12-8MM STAPL (CANNULA) ×1
CANNULA REDUCER 12-8 DVNC XI (CANNULA) ×2 IMPLANT
CHLORAPREP W/TINT 26 (MISCELLANEOUS) ×4 IMPLANT
CLIP VESOLOCK MED LG 6/CT (CLIP) ×4 IMPLANT
COVER TIP SHEARS 8 DVNC (MISCELLANEOUS) ×2 IMPLANT
COVER TIP SHEARS 8MM DA VINCI (MISCELLANEOUS) ×2
COVER WAND RF STERILE (DRAPES) ×4 IMPLANT
DECANTER SPIKE VIAL GLASS SM (MISCELLANEOUS) ×4 IMPLANT
DEFOGGER SCOPE WARMER CLEARIFY (MISCELLANEOUS) ×4 IMPLANT
DERMABOND ADVANCED (GAUZE/BANDAGES/DRESSINGS) ×2
DERMABOND ADVANCED .7 DNX12 (GAUZE/BANDAGES/DRESSINGS) ×2 IMPLANT
DRAPE 3/4 80X56 (DRAPES) ×4 IMPLANT
DRAPE ARM DVNC X/XI (DISPOSABLE) ×8 IMPLANT
DRAPE COLUMN DVNC XI (DISPOSABLE) ×2 IMPLANT
DRAPE DA VINCI XI ARM (DISPOSABLE) ×8
DRAPE DA VINCI XI COLUMN (DISPOSABLE) ×2
GLOVE ORTHO TXT STRL SZ7.5 (GLOVE) ×8 IMPLANT
GLOVE SURG SYN 6.5 ES PF (GLOVE) ×4 IMPLANT
GOWN STRL REUS W/ TWL LRG LVL3 (GOWN DISPOSABLE) ×8 IMPLANT
GOWN STRL REUS W/TWL LRG LVL3 (GOWN DISPOSABLE) ×8
GRASPER SUT TROCAR 14GX15 (MISCELLANEOUS) ×4 IMPLANT
HOLDER FOLEY CATH W/STRAP (MISCELLANEOUS) ×4 IMPLANT
IRRIGATION STRYKERFLOW (MISCELLANEOUS) IMPLANT
IRRIGATOR STRYKERFLOW (MISCELLANEOUS)
IV NS IRRIG 3000ML ARTHROMATIC (IV SOLUTION) IMPLANT
KIT PINK PAD W/HEAD ARE REST (MISCELLANEOUS) ×4
KIT PINK PAD W/HEAD ARM REST (MISCELLANEOUS) ×2 IMPLANT
KIT TURNOVER KIT A (KITS) ×4 IMPLANT
LABEL OR SOLS (LABEL) ×4 IMPLANT
NEEDLE HYPO 22GX1.5 SAFETY (NEEDLE) ×4 IMPLANT
NEEDLE INSUFFLATION 14GA 120MM (NEEDLE) IMPLANT
NS IRRIG 500ML POUR BTL (IV SOLUTION) ×4 IMPLANT
PACK LAP CHOLECYSTECTOMY (MISCELLANEOUS) ×4 IMPLANT
POUCH SPECIMEN RETRIEVAL 10MM (ENDOMECHANICALS) ×8 IMPLANT
RELOAD STAPLER 2.5X45 WHT DVNC (STAPLE) ×2 IMPLANT
SEAL CANN UNIV 5-8 DVNC XI (MISCELLANEOUS) ×8 IMPLANT
SEAL XI 5MM-8MM UNIVERSAL (MISCELLANEOUS) ×8
SET TUBE SMOKE EVAC HIGH FLOW (TUBING) ×4 IMPLANT
SOLUTION ELECTROLUBE (MISCELLANEOUS) ×4 IMPLANT
STAPLER 45 DA VINCI SURE FORM (STAPLE) ×2
STAPLER 45 SUREFORM DVNC (STAPLE) ×2 IMPLANT
STAPLER CANNULA SEAL DVNC XI (STAPLE) ×2 IMPLANT
STAPLER CANNULA SEAL XI (STAPLE) ×2
STAPLER RELOAD 2.5X45 WHITE (STAPLE) ×2
STAPLER RELOAD 2.5X45 WHT DVNC (STAPLE) ×2
SUT MNCRL AB 4-0 PS2 18 (SUTURE) ×4 IMPLANT
SUT VICRYL 0 AB UR-6 (SUTURE) ×4 IMPLANT
TRAY FOLEY SLVR 16FR LF STAT (SET/KITS/TRAYS/PACK) ×4 IMPLANT
TROCAR XCEL 12X100 BLDLESS (ENDOMECHANICALS) ×4 IMPLANT

## 2019-01-24 NOTE — ED Notes (Signed)
NG output up to this time is 650 mL

## 2019-01-24 NOTE — ED Notes (Signed)
80 ml ng output at this time prior to transport

## 2019-01-24 NOTE — Anesthesia Procedure Notes (Signed)
Procedure Name: Intubation Date/Time: 01/24/2019 2:30 PM Performed by: Caryl Asp, CRNA Pre-anesthesia Checklist: Patient identified, Patient being monitored, Timeout performed, Emergency Drugs available and Suction available Patient Re-evaluated:Patient Re-evaluated prior to induction Oxygen Delivery Method: Circle system utilized Preoxygenation: Pre-oxygenation with 100% oxygen Induction Type: IV induction, Rapid sequence and Cricoid Pressure applied Ventilation: Unable to mask ventilate Laryngoscope Size: 3 and McGraph Grade View: Grade I Tube type: Oral Tube size: 7.0 mm Number of attempts: 1 Airway Equipment and Method: Stylet Placement Confirmation: ETT inserted through vocal cords under direct vision,  positive ETCO2 and breath sounds checked- equal and bilateral Secured at: 21 cm Tube secured with: Tape Dental Injury: Teeth and Oropharynx as per pre-operative assessment

## 2019-01-24 NOTE — Anesthesia Postprocedure Evaluation (Signed)
Anesthesia Post Note  Patient: [name]  Procedure(s) Performed: Robot assisted appendectomy  (N/A Abdomen)  Patient location during evaluation: PACU Anesthesia Type: General Level of consciousness: awake and alert Pain management: pain level controlled Vital Signs Assessment: post-procedure vital signs reviewed and stable Respiratory status: spontaneous breathing, nonlabored ventilation, respiratory function stable and patient connected to nasal cannula oxygen Cardiovascular status: blood pressure returned to baseline and stable Postop Assessment: no apparent nausea or vomiting Anesthetic complications: no     Last Vitals:  Vitals:   01/24/19 2056 01/24/19 2150  BP: (!) 172/93 (!) 164/95  Pulse: 71 70  Resp: 18 20  Temp: 36.9 C 37.1 C  SpO2: 97% 93%    Last Pain:  Vitals:   01/24/19 2150  TempSrc: Oral  PainSc:                  Martha Clan

## 2019-01-24 NOTE — Progress Notes (Signed)
[place] [place] [facility] [place] NAME: [name]    MR#:  [id number]  DATE OF BIRTH:  [birth year]  SUBJECTIVE:   Patient continues to have pain around her eyes and headache. She has 650 mL bilious output since yesterday. Some blood-tinged lately. Continues to have abdominal pain. Does not seem to be passing gas. REVIEW OF SYSTEMS:   Review of Systems  Constitutional: Negative for chills, fever and weight loss.  HENT: Negative for ear discharge, ear pain and nosebleeds.   Eyes: Negative for blurred vision, pain and discharge.  Respiratory: Negative for sputum production, shortness of breath, wheezing and stridor.   Cardiovascular: Negative for chest pain, palpitations, orthopnea and PND.  Gastrointestinal: Positive for abdominal pain. Negative for diarrhea, nausea and vomiting.  Genitourinary: Negative for frequency and urgency.  Musculoskeletal: Negative for back pain and joint pain.  Neurological: Negative for sensory change, speech change, focal weakness and weakness.  Psychiatric/Behavioral: Negative for depression and hallucinations. The patient is not nervous/anxious.    Tolerating Diet:npo Tolerating PT: ambulatory at home  DRUG ALLERGIES:   Allergies  Allergen Reactions  . Contrast Media [Iodinated Diagnostic Agents]   . Diphenhydramine Hcl Hives and Itching  . Adhesive [Tape]     PAPER TAPE OK  . Iodine Other (See Comments)  . Prednisone Anxiety  . Sulfa Antibiotics Rash    VITALS:  Blood pressure (!) 187/99, pulse 72, temperature 97.8 F (36.6 C), temperature source Tympanic, resp. rate (!) 24, weight 58 kg, SpO2 100 %.  PHYSICAL EXAMINATION:   Physical Exam  GENERAL:  53 y.o.-year-old patient lying in the bed with all to moderate acute distress.  EYES: Pupils equal, round, reactive to light and accommodation. No scleral icterus. Extraocular muscles intact.  HEENT: Head atraumatic, normocephalic. Oropharynx and nasopharynx  clear. NG+ NECK:  Supple, no jugular venous distention. No thyroid enlargement, no tenderness.  LUNGS: Normal breath sounds bilaterally, no wheezing, rales, rhonchi. No use of accessory muscles of respiration.  CARDIOVASCULAR: S1, S2 normal. No murmurs, rubs, or gallops.  ABDOMEN: Soft, + tender, +distended. . No organomegaly or mass.  EXTREMITIES: No cyanosis, clubbing or edema b/l.    NEUROLOGIC: Cranial nerves II through XII are intact. No focal Motor or sensory deficits b/l.   PSYCHIATRIC:  patient is alert and oriented x 3.  SKIN: No obvious rash, lesion, or ulcer.   LABORATORY PANEL:  CBC Recent Labs  Lab 01/24/19 0331  WBC 20.4*  HGB 15.8*  HCT 47.0*  PLT 349    Chemistries  Recent Labs  Lab 01/23/19 1310 01/24/19 0331  NA 140  --   K 4.6  --   CL 101  --   CO2 24  --   GLUCOSE 145*  --   BUN 16  --   CREATININE 0.81 0.62  CALCIUM 10.0  --   AST 26  --   ALT 14  --   ALKPHOS 115  --   BILITOT 0.7  --    Cardiac Enzymes No results for input(s): TROPONINI in the last 168 hours. RADIOLOGY:  Dg Abd 1 View  Result Date: 01/24/2019 CLINICAL DATA:  Small bowel obstruction. EXAM: ABDOMEN - 1 VIEW COMPARISON:  January 23, 2019. FINDINGS: Stable small bowel dilatation is noted concerning for distal small bowel obstruction. Distal tip of nasogastric tube is seen in expected position of proximal stomach. Phleboliths are noted in the pelvis. IMPRESSION: Distal tip of nasogastric tube is seen in expected position  of proximal stomach. Stable small bowel dilatation is noted concerning for distal small bowel obstruction. Electronically Signed   By: Marijo Conception M.D.   On: 01/24/2019 08:08   Dg Abdomen 1 View  Result Date: 01/23/2019 CLINICAL DATA:  Nasogastric tube placement EXAM: ABDOMEN - 1 VIEW COMPARISON:  CT abdomen and pelvis January 23, 2019 FINDINGS: Nasogastric tube tip and side port are in the stomach. There are loops of mildly dilated small bowel. No air-fluid  levels. No free air. Lung bases are clear. IMPRESSION: Nasogastric tube tip and side port in stomach. Loops of mildly dilated small bowel noted. No free air. Lung bases clear. Electronically Signed   By: Lowella Grip III M.D.   On: 01/23/2019 18:07   Ct Renal Stone Study  Result Date: 01/23/2019 CLINICAL DATA:  Abdominal pain and vomiting. EXAM: CT ABDOMEN AND PELVIS WITHOUT CONTRAST TECHNIQUE: Multidetector CT imaging of the abdomen and pelvis was performed following the standard protocol without IV contrast. COMPARISON:  None. FINDINGS: Lower chest: Small hiatal hernia. Otherwise negative. Hepatobiliary: No focal liver abnormality is seen. No gallstones, gallbladder wall thickening, or biliary dilatation. Pancreas: Unremarkable. No pancreatic ductal dilatation or surrounding inflammatory changes. Spleen: Normal in size without focal abnormality. Adrenals/Urinary Tract: Adrenal glands and kidneys appear normal. No hydronephrosis. Bladder appears normal. Vascular calcification in the hilum of the left kidney. Stomach/Bowel: There multiple dilated loops of small bowel to the level of the terminal ileum. The colon is not distended. Appendix appears normal. Stomach is normal except for a small hiatal hernia. Vascular/Lymphatic: Aortic atherosclerosis. No enlarged abdominal or pelvic lymph nodes. Reproductive: Uterus and bilateral adnexa are unremarkable. Other: No abdominal wall hernia or abnormality. No abdominopelvic ascites. Musculoskeletal: No acute bone abnormality. Previous lower lumbar fusion. IMPRESSION: Small bowel obstruction at the level of the terminal ileum. Electronically Signed   By: Lorriane Shire M.D.   On: 01/23/2019 16:22   ASSESSMENT AND PLAN:  [name]  is a 53 y.o. female with a known history of depression, history of seizure, Jerrye Bushy, traumatic brain injury in 2009, chronic neck pain, history of cervical cancer comes to the emergency room with complaints of abdominal pain which started  yesterday. Patient said he started all of a sudden she could not stay comfortable. It would come and go. Was passing gas yesterday. Started vomiting this morning.   1. Small bowel obstruction/ileus -patient presented with abdominal pain and vomiting pain started yesterday vomiting this morning. -NPO -IV fluids -NG tube with low cont suction -KUB shows no improvement--pt will be going for laproscopic exploration today -IV Toradol and morphine PRN  2. Uncontrolled blood pressure without diagnosis of hypertension in the setting of abdominal pain -PRN IV labetalol -if persistently stays elevated consider clonidine patches since patient will be NPO and add oral meds as required  3. Chronic pain/cervicalagia -patient takes soma at home -she also is on gabapentin-- will resume once able to  4. Gerd -IV Protonix bid  5. DVT prophylaxis -holding for now since NG return showed some blood  Family Communication: spoke with patient's son Rodman Key Strycharz on the phone yday Consults: surgery Code Status: full DVT prophylaxis: On hold due to GI bleed  TOTAL TIME TAKING CARE OF THIS PATIENT: *30* minutes.  >50% time spent on counselling and coordination of care  POSSIBLE D/C IN *?* DAYS, DEPENDING ON CLINICAL CONDITION.  Note: This dictation was prepared with Dragon dictation along with smaller phrase technology. Any transcriptional errors that result from this process are  unintentional.  Fritzi Mandes M.D on 01/24/2019 at 5:07 PM  Between 7am to 6pm - Pager - [phone]  After 6pm go to [email or web address]  [facility]   CC: Primary care physician; Remi Haggard, FNPPatient ID: [name], female   DOB: [birth year], 53 y.o.   MRN: [id number]

## 2019-01-24 NOTE — ED Notes (Signed)
Report given to Kelly, RN.

## 2019-01-24 NOTE — Progress Notes (Addendum)
ADDENDUM 01/24/19 2:32 PM Patient with allergy to oral contrast which eliminates ability to reassess small bowel obstruction. Reviewed imaging and case with Dr Christian Mate. Etiology of obstruction uncertain given lack of previous abdominal surgeries. Given our limited ability to reassess obstruction and lack of clinical improvement today we will proceed to the OR for diagnostic laparoscopy with possible bowel resection, lysis of adhesions, and need to possibly convert to pen procedure. I discussed the risks, benefits, and alternative with the patient and her son over the phone and they are both willing to proceed with surgical intervention described above. Consent obtained. Plan for OR this evening.     [facility] SURGICAL PROGRESS NOTE (cpt [phone])  Hospital Day(s): 1.   Interval History: Patient seen and examined, no acute events or new complaints overnight. Patient is very frustrated and complaining of irritation regarding her NGT. She also still notes left > right abdominal pain, unchanged from admission. She believes her abdomen remains distended. + Nausea. No emesis. No flatus. NGT has put out about 650 ccs since placement.  Worsening leukocytosis (20k), may be some component of dehydration.   Review of Systems:  Constitutional: denies fever, chills  HEENT: denies cough or congestion, + sore throat  Respiratory: denies any shortness of breath  Cardiovascular: denies chest pain or palpitations  Gastrointestinal: + abdominal pain, + Nausea, denied Vomiting, or diarrhea/and bowel function as per interval history Genitourinary: denies burning with urination or urinary frequency Musculoskeletal: denies pain, decreased motor or sensation   Vital signs in last 24 hours: [min-max] current  Temp:  [98.3 F (36.8 C)] 98.3 F (36.8 C) (11/25 0316) Pulse Rate:  [65-85] 70 (11/25 0500) Resp:  [15-22] 16 (11/25 0500) BP: (162-213)/(89-126) 162/94 (11/25 1307) SpO2:  [96 %-100 %]  96 % (11/25 0500)       Weight: 58 kg     Intake/Output last 2 shifts:  No intake/output data recorded.   Physical Exam:  Constitutional: alert, cooperative and no distress  HENT: normocephalic without obvious abnormality, NGT in place  Eyes: PERRL, EOM's grossly intact and symmetric  Respiratory: breathing non-labored at rest  Cardiovascular: regular rate and sinus rhythm  Gastrointestinal: soft, tenderness worse on the LLQ, mild distension, tympanic to percussion, no rebound/guarding, no overt peritonitis Musculoskeletal: no edema or wounds, motor and sensation grossly intact, NT    Labs:  CBC Latest Ref Rng & Units 01/24/2019 01/23/2019 09/20/2018  WBC 4.0 - 10.5 K/uL 20.4(H) 14.3(H) 11.9(H)  Hemoglobin 12.0 - 15.0 g/dL 15.8(H) 16.4(H) 14.9  Hematocrit 36.0 - 46.0 % 47.0(H) 48.6(H) 45.1  Platelets 150 - 400 K/uL 349 345 334   CMP Latest Ref Rng & Units 01/24/2019 01/23/2019 09/20/2018  Glucose 70 - 99 mg/dL - 145(H) 112(H)  BUN 6 - 20 mg/dL - 16 15  Creatinine 0.44 - 1.00 mg/dL 0.62 0.81 0.77  Sodium 135 - 145 mmol/L - 140 140  Potassium 3.5 - 5.1 mmol/L - 4.6 3.7  Chloride 98 - 111 mmol/L - 101 102  CO2 22 - 32 mmol/L - 24 29  Calcium 8.9 - 10.3 mg/dL - 10.0 9.4  Total Protein 6.5 - 8.1 g/dL - 8.7(H) -  Total Bilirubin 0.3 - 1.2 mg/dL - 0.7 -  Alkaline Phos 38 - 126 U/L - 115 -  AST 15 - 41 U/L - 26 -  ALT 0 - 44 U/L - 14 -     Imaging studies:   KUB (01/24/2019) personally reviewed still with dilated loops of small bowel  unchanged compared to prior, and radiologist report reviewed:  IMPRESSION: Distal tip of nasogastric tube is seen in expected position of proximal stomach. Stable small bowel dilatation is noted concerning for distal small bowel obstruction.   Assessment/Plan: (ICD-10's: K51.609) 53 y.o. female with persistent small bowel obstruction with transition point near TI despite no previous abdominal surgeries reported, etiology unclear   - Given lack of  clinical improvement, I think it is reasonable to give gastrografin through NGT and obtain serial KUBs at 8 and 24 hours   - No emergent surgical intervention; will follow KUB results  - Continue NGT decompression; monitor output  - Remain NPO + IVF resuscitation  - Monitor leukocytosis; electrolytes   - Pain control prn; antiemetics prn  - monitor abdominal examination; on-going bowel function  - mobilization if possible   - further management per primary team   All of the above findings and recommendations were discussed with the patient, and the medical team, and all of patient's questions were answered to her expressed satisfaction.   -- Edison Simon, PA-C [facility] 01/24/2019, 1:13 PM [phone] M-F: 7am - 4pm

## 2019-01-24 NOTE — Transfer of Care (Signed)
Immediate Anesthesia Transfer of Care Note  Patient: [name]  Procedure(s) Performed: ROBOT ASSISTED SMALL BOWEL RESECTION (N/A Abdomen)  Patient Location: PACU  Anesthesia Type:General  Level of Consciousness: awake, alert  and oriented  Airway & Oxygen Therapy: Patient Spontanous Breathing and Patient connected to face mask oxygen  Post-op Assessment: Report given to RN and Post -op Vital signs reviewed and stable  Post vital signs: Reviewed and stable  Last Vitals:  Vitals Value Taken Time  BP 139/80 01/24/19 1818  Temp 36.7 C 01/24/19 1818  Pulse 74 01/24/19 1822  Resp 17 01/24/19 1822  SpO2 100 % 01/24/19 1822  Vitals shown include unvalidated device data.  Last Pain:  Vitals:   01/24/19 1818  TempSrc:   PainSc: Asleep         Complications: No apparent anesthesia complications

## 2019-01-24 NOTE — Op Note (Signed)
Robotic assisted laparoscopic appendectomy with lysis of distal ileal adhesion.  Pre-operative Diagnosis: Small bowel obstruction at distal ileum.  Post-operative Diagnosis: same, possibly secondary to distal ileal adhesion to appendiceal mesentery.  Surgeon: Ronny Bacon, M.D., FACS  Anesthesia: General   Findings: The only adhesions noted is potentially partially obstructive involving the distal ileum and the appendiceal mesentery.  There is no transition appreciable.  The right colon is no longer decompressed.  Estimated Blood Loss: 15 mL                 Specimens: Appendix          Complications: none              Procedure Details  The patient was seen again in the Holding Room. The benefits, complications, treatment options, and expected outcomes were discussed with the patient. The risks of bleeding, infection, recurrence of symptoms, failure to resolve symptoms, unanticipated injury, prosthetic placement, prosthetic infection, any of which could require further surgery were reviewed with the patient. The likelihood of improving the patient's symptoms with return to their baseline status is expected.  The patient and/or family concurred with the proposed plan, giving informed consent.  The patient was taken to Operating Room, identified and the procedure verified.   The patient was positioned in the supine position. Prior to the induction of general anesthesia, antibiotic prophylaxis was administered. VTE prophylaxis was in place. GETA was then administered and tolerated well. After the induction, the abdomen was prepped with Chloraprep and draped in the sterile fashion.  A Time Out was held and the above information confirmed. After local infiltration of quarter percent Marcaine with epinephrine, stab incision was made left upper quadrant.  Just below the costal margin approximately midclavicular line the Veress needle is passed with sensation of the layers to penetrate the  abdominal wall and into the peritoneum.  Saline drop test is confirmed peritoneal placement.  Insufflation is initiated with carbon dioxide to pressures of 15 mmHg. Optical entry is utilized with a 12 mm trocar in the right upper quadrant.  Passed through the abdominal wall planes and into the peritoneal cavity without difficulty under direct visualization.  Under direct visualization and epigastric 8 mm and left upper quadrant 8 mm robotic trochars were placed.  Initial visualization did not identify any apparent abnormality.  I then docked the robot to emphasize the lower abdomen and be able to access both the left lower quadrant where she complains of pain in the right lower quadrant where there is a presumed obstructive process. With tip gentle bowel graspers and a fenestrated bipolar graspers I begin in the right lower quadrant found the ileocecal valve and progress from there proximally evaluating the entire length of small bowel.  There was a tethering adhesion of the distal ileum to the mesentery of the appendix, which may have had some partial obstructive component.  It is difficult to be certain due to the fact that there is no transition.  However this was lysed and the entire length of small bowel was evaluated.  There were no other apparent serosal abnormalities of the small intestine. I then grasped the omentum and elevated it to visualize the transverse colon which was unremarkable from its serosal aspect.  The left colon and sigmoid colon appeared similar.  No evidence of inflammation no evidence of any perforation.  The right colon did not appear decompressed as it did on the CT scan.  We then tilted the patient into 27 degrees  of Trendelenburg to further evaluate the pelvic structures.  Her uterus and adnexa were unremarkable.  There was some clear serous fluid in the pelvis. The appendix may be slightly swollen, without outward evidence of exudative changes and without evidence of serositis  grossly.  Due to the fact that there was an adhesion involving the blood supply the appendix, I felt an incidental appendectomy would present very minimal morbidity, and perhaps may explain the leukocytosis. I then took the mesoappendix with the fenestrated bipolar mobilized the appendix to its junction with the cecum.  I then utilized the robotic stapling device a 45 mm cartridge was used and and I divided the appendix with a minimal cuff of cecum ensuring that there was no compromise at all to the ileocecal valve region.  This left a nice hemostatic mesoappendix, and stapled cecal cuff.  This was placed in Endo Catch bag and retrieved out of the right upper quadrant 12 mm port site.  There was no need for irrigation. I then remove the 12 mm port, closed the fascia with the suture passer using the 0 Vicryl.  We then closed the skin after desufflated the abdomen and removing all the ports with interrupted and running 4-0 Monocryl.  The skin incisions were then sealed with Dermabond.  Needle sponge instrument count reported correct, patient appeared to tolerate the procedure well.     Ronny Bacon, MD, FACS

## 2019-01-24 NOTE — Anesthesia Preprocedure Evaluation (Addendum)
Anesthesia Evaluation  Patient identified by MRN, date of birth, ID band Patient awake    Reviewed: Allergy & Precautions, H&P , NPO status , Patient's Chart, lab work & pertinent test results, reviewed documented beta blocker date and time   History of Anesthesia Complications Negative for: history of anesthetic complications  Airway Mallampati: III  TM Distance: >3 FB Neck ROM: full    Dental  (+) Dental Advidsory Given, Poor Dentition, Missing, Chipped   Pulmonary neg shortness of breath, neg COPD, neg recent URI, Current Smoker,    Pulmonary exam normal        Cardiovascular negative cardio ROS Normal cardiovascular exam     Neuro/Psych Seizures -,  PSYCHIATRIC DISORDERS Anxiety Depression TBI - 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 use of the left side    GI/Hepatic Neg liver ROS, GERD  Medicated,  Endo/Other  negative endocrine ROS  Renal/GU negative Renal ROS  negative genitourinary   Musculoskeletal   Abdominal   Peds  Hematology negative hematology ROS (+)   Anesthesia Other Findings Past Medical History: No date: BRBPR (bright red blood per rectum) No date: Breast mass, left No date: Cancer ([place])     Comment:  CERVICAL No date: Depression No date: Falls     Comment:  EASILY No date: GERD (gastroesophageal reflux disease) No date: PTSD (post-traumatic stress disorder) No date: Seizures ([place]) No date: Shoulder disorder     Comment:  LIMITED USE OF LEFT SHOULDER AND ARM No date: TBI (traumatic brain injury) ([place])     Comment:  SKULL FX 2009 No date: Vertigo No date: Vertigo Past Surgical History: No date: abd pain No date: BACK SURGERY     Comment:  LUMBAR FUSIONS X 2 No date: BREAST SURGERY 01/07/2015: CATARACT EXTRACTION W/PHACO; Right     Comment:  Procedure: CATARACT EXTRACTION PHACO AND INTRAOCULAR               LENS PLACEMENT (IOC);  Surgeon: Birder Robson, MD;                Location: [facility] ORS;  Service:  Ophthalmology;  Laterality:              Right;  [place] 0 AP% 0 CDE 0 fluid pack lot [id number] 01/28/2015: CATARACT EXTRACTION W/PHACO; Left     Comment:  Procedure: CATARACT EXTRACTION PHACO AND INTRAOCULAR               LENS PLACEMENT (IOC);  Surgeon: Birder Robson, MD;                Location: [facility] ORS;  Service: Ophthalmology;  Laterality:              Left;  [place] 00:18 AP%7.1 CDE1.32 fluid pack lot #               [id number] No date: LEEP BMI    Body Mass Index: 19.44 kg/m     Reproductive/Obstetrics negative OB ROS                            Anesthesia Physical Anesthesia Plan  ASA: II  Anesthesia Plan: General   Post-op Pain Management:    Induction: Intravenous, Rapid sequence and Cricoid pressure planned  PONV Risk Score and Plan: 2 and Ondansetron, Dexamethasone, Midazolam and Treatment may vary due to age or medical condition  Airway Management Planned: Oral ETT  Additional Equipment:   Intra-op Plan:   Post-operative Plan: Extubation in OR  Informed Consent: I have reviewed the patients History and Physical, chart, labs and discussed the procedure including the risks, benefits and alternatives for the proposed anesthesia with the patient or authorized representative who has indicated his/her understanding and acceptance.     Dental Advisory Given  Plan Discussed with: CRNA  Anesthesia Plan Comments: (Past Medical History: No date: BRBPR (bright red blood per rectum) No date: Breast mass, left No date: Cancer ([place])     Comment:  CERVICAL No date: Depression No date: Falls     Comment:  EASILY No date: GERD (gastroesophageal reflux disease) No date: PTSD (post-traumatic stress disorder) No date: Seizures ([place]) No date: Shoulder disorder     Comment:  LIMITED USE OF LEFT SHOULDER AND ARM No date: TBI (traumatic brain injury) ([place])     Comment:  SKULL FX 2009 No date: Vertigo No date: Vertigo)       Anesthesia Quick  Evaluation

## 2019-01-24 NOTE — ED Notes (Signed)
Pt urinated in bed. Pt gown and linen changed and pt cleaned up.

## 2019-01-24 NOTE — Anesthesia Post-op Follow-up Note (Signed)
Anesthesia QCDR form completed.        

## 2019-01-24 NOTE — ED Notes (Signed)
Bright red blood noted coming up in pt NG tube. MD notified at this time

## 2019-01-25 ENCOUNTER — Encounter: Payer: Self-pay | Admitting: Surgery

## 2019-01-25 LAB — CBC
HCT: 40.8 % (ref 36.0–46.0)
Hemoglobin: 13.5 g/dL (ref 12.0–15.0)
MCH: 30 pg (ref 26.0–34.0)
MCHC: 33.1 g/dL (ref 30.0–36.0)
MCV: 90.7 fL (ref 80.0–100.0)
Platelets: 322 10*3/uL (ref 150–400)
RBC: 4.5 MIL/uL (ref 3.87–5.11)
RDW: 13.2 % (ref 11.5–15.5)
WBC: 17.8 10*3/uL — ABNORMAL HIGH (ref 4.0–10.5)
nRBC: 0 % (ref 0.0–0.2)

## 2019-01-25 MED ORDER — ACETAMINOPHEN 500 MG PO TABS
1000.0000 mg | ORAL_TABLET | Freq: Four times a day (QID) | ORAL | Status: DC
  Administered 2019-01-25 – 2019-01-26 (×3): 1000 mg via ORAL
  Administered 2019-01-26: 500 mg via ORAL
  Administered 2019-01-26 – 2019-01-28 (×8): 1000 mg via ORAL
  Filled 2019-01-25 (×14): qty 2

## 2019-01-25 MED ORDER — CARISOPRODOL 350 MG PO TABS
700.0000 mg | ORAL_TABLET | Freq: Two times a day (BID) | ORAL | Status: DC
  Administered 2019-01-25 – 2019-01-28 (×7): 700 mg via ORAL
  Filled 2019-01-25 (×7): qty 2

## 2019-01-25 MED ORDER — HYDROMORPHONE HCL 1 MG/ML IJ SOLN
0.5000 mg | INTRAMUSCULAR | Status: DC | PRN
  Administered 2019-01-25 – 2019-01-28 (×4): 0.5 mg via INTRAVENOUS
  Filled 2019-01-25 (×4): qty 0.5

## 2019-01-25 MED ORDER — CHLORHEXIDINE GLUCONATE CLOTH 2 % EX PADS
6.0000 | MEDICATED_PAD | Freq: Every day | CUTANEOUS | Status: DC
  Administered 2019-01-25: 6 via TOPICAL

## 2019-01-25 MED ORDER — TIOTROPIUM BROMIDE MONOHYDRATE 18 MCG IN CAPS
ORAL_CAPSULE | Freq: Every day | RESPIRATORY_TRACT | Status: DC
  Administered 2019-01-26 – 2019-01-28 (×3): via RESPIRATORY_TRACT
  Filled 2019-01-25: qty 5

## 2019-01-25 MED ORDER — ALBUTEROL SULFATE (2.5 MG/3ML) 0.083% IN NEBU: 3.0000 mL | INHALATION_SOLUTION | RESPIRATORY_TRACT | Status: DC | PRN

## 2019-01-25 MED ORDER — VENLAFAXINE HCL ER 75 MG PO CP24
150.0000 mg | ORAL_CAPSULE | Freq: Every day | ORAL | Status: DC
  Administered 2019-01-25 – 2019-01-28 (×4): 150 mg via ORAL
  Filled 2019-01-25 (×4): qty 2

## 2019-01-25 MED ORDER — PANTOPRAZOLE SODIUM 40 MG PO TBEC
40.0000 mg | DELAYED_RELEASE_TABLET | Freq: Every day | ORAL | Status: DC
  Administered 2019-01-25 – 2019-01-28 (×4): 40 mg via ORAL
  Filled 2019-01-25 (×4): qty 1

## 2019-01-25 MED ORDER — LAMOTRIGINE 25 MG PO TABS
150.0000 mg | ORAL_TABLET | Freq: Two times a day (BID) | ORAL | Status: DC
  Filled 2019-01-25 (×7): qty 1

## 2019-01-25 MED ORDER — HEPARIN SODIUM (PORCINE) 5000 UNIT/ML IJ SOLN
5000.0000 [IU] | Freq: Three times a day (TID) | INTRAMUSCULAR | Status: DC
  Administered 2019-01-25 – 2019-01-28 (×9): 5000 [IU] via SUBCUTANEOUS
  Filled 2019-01-25 (×11): qty 1

## 2019-01-25 MED ORDER — LORATADINE 10 MG PO TABS
10.0000 mg | ORAL_TABLET | Freq: Every day | ORAL | Status: DC
  Administered 2019-01-25 – 2019-01-28 (×4): 10 mg via ORAL
  Filled 2019-01-25 (×4): qty 1

## 2019-01-25 MED ORDER — KETOROLAC TROMETHAMINE 30 MG/ML IJ SOLN
30.0000 mg | Freq: Four times a day (QID) | INTRAMUSCULAR | Status: DC
  Administered 2019-01-25 – 2019-01-27 (×8): 30 mg via INTRAVENOUS
  Filled 2019-01-25 (×9): qty 1

## 2019-01-25 MED ORDER — CLONIDINE HCL 0.1 MG PO TABS
0.1000 mg | ORAL_TABLET | Freq: Two times a day (BID) | ORAL | Status: DC
  Administered 2019-01-25 – 2019-01-28 (×7): 0.1 mg via ORAL
  Filled 2019-01-25 (×7): qty 1

## 2019-01-25 MED ORDER — OXYCODONE HCL 5 MG PO TABS
5.0000 mg | ORAL_TABLET | ORAL | Status: DC | PRN
  Administered 2019-01-25 – 2019-01-28 (×10): 10 mg via ORAL
  Administered 2019-01-28: 5 mg via ORAL
  Administered 2019-01-28: 10 mg via ORAL
  Filled 2019-01-25 (×13): qty 2
  Filled 2019-01-25: qty 1

## 2019-01-25 MED ORDER — GABAPENTIN 300 MG PO CAPS
300.0000 mg | ORAL_CAPSULE | Freq: Three times a day (TID) | ORAL | Status: DC
  Administered 2019-01-25 – 2019-01-28 (×11): 300 mg via ORAL
  Filled 2019-01-25 (×11): qty 1

## 2019-01-25 NOTE — Plan of Care (Signed)
  Problem: Education: Goal: Knowledge of General Education information will improve Description: Including pain rating scale, medication(s)/side effects and non-pharmacologic comfort measures Outcome: Progressing   Problem: Pain Managment: Goal: General experience of comfort will improve Outcome: Progressing   

## 2019-01-25 NOTE — Progress Notes (Signed)
01/25/2019  Subjective: Patient is 1 Day Post-Op robotic assisted diagnostic laparoscopy with appendectomy and lysis of 1 adhesion.  This was done for small bowel obstruction although intraoperatively, there was no evidence of obstruction and the colon was not decompressed.  There is one adhesion going around the mesial appendix which was lysed as a precaution and the appendix was removed as well as a precaution.  Otherwise there is no evidence for other pathology.  Today the patient reports significant abdominal pain.  She reports that she is starting to have some flatus.  Vital signs: Temp:  [97.8 F (36.6 C)-99.3 F (37.4 C)] 98.5 F (36.9 C) (11/26 1244) Pulse Rate:  [67-83] 71 (11/26 1244) Resp:  [13-28] 20 (11/26 1244) BP: (126-187)/(80-99) 126/84 (11/26 1244) SpO2:  [93 %-100 %] 98 % (11/26 1244)   Intake/Output: 11/25 0701 - 11/26 0700 In: 2511.7 [I.V.:2411.7; IV Piggyback:100] Out: 750 [Urine:650] Last BM Date: 01/25/19  Physical Exam: Constitutional: No acute distress Abdomen: Soft, mildly distended, tender to palpation over the incisions and lower abdomen.  Nonperitoneal.  Incisions are clean dry and intact with Dermabond in place  Labs:  Recent Labs    01/24/19 0331 01/25/19 0908  WBC 20.4* 17.8*  HGB 15.8* 13.5  HCT 47.0* 40.8  PLT 349 322   Recent Labs    01/23/19 1310 01/24/19 0331  NA 140  --   K 4.6  --   CL 101  --   CO2 24  --   GLUCOSE 145*  --   BUN 16  --   CREATININE 0.81 0.62  CALCIUM 10.0  --    No results for input(s): LABPROT, INR in the last 72 hours.  Imaging: No results found.  Assessment/Plan: This is a 53 y.o. female s/p robotic assisted laparoscopic appendectomy with lysis of a distal ileal adhesion.  -Discussed with the patient that the findings in the operating room do not support any evidence for small bowel obstruction and there was no blockage or significant adhesion to support this or a transition point that was found.   The colon was no longer decompressed during the surgery.  At this point is unclear why the patient had appearance of small bowel obstruction on her imaging studies. -Adjusted the pain medication and started her on IV Toradol, resume her oral oxycodone from home in addition to as needed for surgical pain, and added IV Dilaudid as well for breakthrough pain control.  She continues on her home Neurontin as well. -DC Foley today. -Continue clear liquids today.  If she continues to have flatus today, we may be able to advance her to a full liquid diet.   Melvyn Neth, [facility]

## 2019-01-25 NOTE — Progress Notes (Signed)
[facility] [place] NAME: [name]    MR#:  [id number]  DATE OF BIRTH:  04-Nov-1965  SUBJECTIVE:   POD # Laparscopic/Robotic surgery for SBO patient frustrated because she is having a lot of pain at the incision site. Unable to swallow much because of drop throat due to NG tube no fever REVIEW OF SYSTEMS:   Review of Systems  Constitutional: Negative for chills, fever and weight loss.  HENT: Negative for ear discharge, ear pain and nosebleeds.   Eyes: Negative for blurred vision, pain and discharge.  Respiratory: Negative for sputum production, shortness of breath, wheezing and stridor.   Cardiovascular: Negative for chest pain, palpitations, orthopnea and PND.  Gastrointestinal: Positive for abdominal pain. Negative for diarrhea, nausea and vomiting.  Genitourinary: Negative for frequency and urgency.  Musculoskeletal: Negative for back pain and joint pain.  Neurological: Negative for sensory change, speech change, focal weakness and weakness.  Psychiatric/Behavioral: Negative for depression and hallucinations. The patient is not nervous/anxious.    Tolerating Diet:CLD Tolerating PT: ambulatory at home  DRUG ALLERGIES:   Allergies  Allergen Reactions  . Contrast Media [Iodinated Diagnostic Agents]   . Diphenhydramine Hcl Hives and Itching  . Adhesive [Tape]     PAPER TAPE OK  . Iodine Other (See Comments)  . Prednisone Anxiety  . Sulfa Antibiotics Rash    VITALS:  Blood pressure (!) 182/95, pulse 70, temperature 98.2 F (36.8 C), temperature source Axillary, resp. rate 20, weight 58 kg, SpO2 98 %.  PHYSICAL EXAMINATION:   Physical Exam  GENERAL:  53 y.o.-year-old patient lying in the bed with all to moderate acute distress.  EYES: Pupils equal, round, reactive to light and accommodation. No scleral icterus. Extraocular muscles intact.  HEENT: Head atraumatic, normocephalic. Oropharynx and nasopharynx clear. NECK:   Supple, no jugular venous distention. No thyroid enlargement, no tenderness.  LUNGS: Normal breath sounds bilaterally, no wheezing, rales, rhonchi. No use of accessory muscles of respiration.  CARDIOVASCULAR: S1, S2 normal. No murmurs, rubs, or gallops.  ABDOMEN: Soft, + tender, non distended. . No organomegaly or mass. Surgical incision appears clean EXTREMITIES: No cyanosis, clubbing or edema b/l.    NEUROLOGIC: Cranial nerves II through XII are intact. No focal Motor or sensory deficits b/l.   PSYCHIATRIC:  patient is alert and oriented x 3.  SKIN: No obvious rash, lesion, or ulcer.   LABORATORY PANEL:  CBC Recent Labs  Lab 01/25/19 0908  WBC 17.8*  HGB 13.5  HCT 40.8  PLT 322    Chemistries  Recent Labs  Lab 01/23/19 1310 01/24/19 0331  NA 140  --   K 4.6  --   CL 101  --   CO2 24  --   GLUCOSE 145*  --   BUN 16  --   CREATININE 0.81 0.62  CALCIUM 10.0  --   AST 26  --   ALT 14  --   ALKPHOS 115  --   BILITOT 0.7  --    Cardiac Enzymes No results for input(s): TROPONINI in the last 168 hours. RADIOLOGY:  Dg Abd 1 View  Result Date: 01/24/2019 CLINICAL DATA:  Small bowel obstruction. EXAM: ABDOMEN - 1 VIEW COMPARISON:  January 23, 2019. FINDINGS: Stable small bowel dilatation is noted concerning for distal small bowel obstruction. Distal tip of nasogastric tube is seen in expected position of proximal stomach. Phleboliths are noted in the pelvis. IMPRESSION: Distal tip of nasogastric tube is seen  in expected position of proximal stomach. Stable small bowel dilatation is noted concerning for distal small bowel obstruction. Electronically Signed   By: Marijo Conception M.D.   On: 01/24/2019 08:08   Dg Abdomen 1 View  Result Date: 01/23/2019 CLINICAL DATA:  Nasogastric tube placement EXAM: ABDOMEN - 1 VIEW COMPARISON:  CT abdomen and pelvis January 23, 2019 FINDINGS: Nasogastric tube tip and side port are in the stomach. There are loops of mildly dilated small bowel.  No air-fluid levels. No free air. Lung bases are clear. IMPRESSION: Nasogastric tube tip and side port in stomach. Loops of mildly dilated small bowel noted. No free air. Lung bases clear. Electronically Signed   By: Lowella Grip III M.D.   On: 01/23/2019 18:07   Ct Renal Stone Study  Result Date: 01/23/2019 CLINICAL DATA:  Abdominal pain and vomiting. EXAM: CT ABDOMEN AND PELVIS WITHOUT CONTRAST TECHNIQUE: Multidetector CT imaging of the abdomen and pelvis was performed following the standard protocol without IV contrast. COMPARISON:  None. FINDINGS: Lower chest: Small hiatal hernia. Otherwise negative. Hepatobiliary: No focal liver abnormality is seen. No gallstones, gallbladder wall thickening, or biliary dilatation. Pancreas: Unremarkable. No pancreatic ductal dilatation or surrounding inflammatory changes. Spleen: Normal in size without focal abnormality. Adrenals/Urinary Tract: Adrenal glands and kidneys appear normal. No hydronephrosis. Bladder appears normal. Vascular calcification in the hilum of the left kidney. Stomach/Bowel: There multiple dilated loops of small bowel to the level of the terminal ileum. The colon is not distended. Appendix appears normal. Stomach is normal except for a small hiatal hernia. Vascular/Lymphatic: Aortic atherosclerosis. No enlarged abdominal or pelvic lymph nodes. Reproductive: Uterus and bilateral adnexa are unremarkable. Other: No abdominal wall hernia or abnormality. No abdominopelvic ascites. Musculoskeletal: No acute bone abnormality. Previous lower lumbar fusion. IMPRESSION: Small bowel obstruction at the level of the terminal ileum. Electronically Signed   By: Lorriane Shire M.D.   On: 01/23/2019 16:22   ASSESSMENT AND PLAN:  [name]  is a 53 y.o. female with a known history of depression, history of seizure, Jerrye Bushy, traumatic brain injury in 2009, chronic neck pain, history of cervical cancer comes to the emergency room with complaints of abdominal pain  which started yesterday. Patient said he started all of a sudden she could not stay comfortable. It would come and go. Was passing gas yesterday. Started vomiting this morning.   1. Small bowel obstruction/ileus -patient presented with abdominal pain and vomiting pain  -s/p POD#1 robotic assisted laparoscopic appendectomy with lysis of distal illegal adhesion -contIV fluids -clear liquid diet---once as tolerated per surgery -pain medication to be managed by surgery Dr. [place] Abbot  2. Uncontrolled blood pressure without diagnosis of hypertension in the setting of abdominal pain -PRN IV labetalol -start clonidine 0.1 mg BID  3. Chronic pain/cervicalagia -patient takes soma at home -she also is on gabapentin-- will resume today   4. Gerd -IV Protonix bid-- change to oral Protonix  5. DVT prophylaxis -subcu heparin  Family Communication: spoke with patient' Consults: surgery Code Status: full DVT prophylaxis:  heparin   TOTAL TIME TAKING CARE OF THIS PATIENT: *30* minutes.  >50% time spent on counselling and coordination of care  POSSIBLE D/C IN *?* DAYS, DEPENDING ON CLINICAL CONDITION.  Note: This dictation was prepared with Dragon dictation along with smaller phrase technology. Any transcriptional errors that result from this process are unintentional.  Fritzi Mandes M.D on 01/25/2019 at 12:15 PM  Between 7am to 6pm - Pager - [phone]  After 6pm go  to [email or web address]  [facility]   CC: Primary care physician; Remi Haggard, FNPPatient ID: [name], female   DOB: [birth year], 53 y.o.   MRN: [id number]
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
## 2019-01-26 LAB — BASIC METABOLIC PANEL
Anion gap: 9 (ref 5–15)
BUN: 15 mg/dL (ref 6–20)
CO2: 26 mmol/L (ref 22–32)
Calcium: 8.4 mg/dL — ABNORMAL LOW (ref 8.9–10.3)
Chloride: 104 mmol/L (ref 98–111)
Creatinine, Ser: 0.59 mg/dL (ref 0.44–1.00)
GFR calc Af Amer: >60 mL/min (ref >60)
GFR calc non Af Amer: >60 mL/min (ref >60)
Glucose, Bld: 104 mg/dL — ABNORMAL HIGH (ref 70–99)
Potassium: 3.5 mmol/L (ref 3.5–5.1)
Sodium: 139 mmol/L (ref 135–145)

## 2019-01-26 LAB — CBC
HCT: 38.5 % (ref 36.0–46.0)
Hemoglobin: 12.4 g/dL (ref 12.0–15.0)
MCH: 29.7 pg (ref 26.0–34.0)
MCHC: 32.2 g/dL (ref 30.0–36.0)
MCV: 92.1 fL (ref 80.0–100.0)
Platelets: 276 10*3/uL (ref 150–400)
RBC: 4.18 MIL/uL (ref 3.87–5.11)
RDW: 13.2 % (ref 11.5–15.5)
WBC: 10.3 10*3/uL (ref 4.0–10.5)
nRBC: 0 % (ref 0.0–0.2)

## 2019-01-26 NOTE — Progress Notes (Signed)
[facility] [place] NAME: [name]    MR#:  [id number]  DATE OF BIRTH:  1966-01-27  SUBJECTIVE:   POD # 2 Laparscopic/Robotic surgery for SBO with lysis of One adhesion  Looks better today. Has pain with sitting up. Flatus+ HA better than yday no fever REVIEW OF SYSTEMS:   Review of Systems  Constitutional: Negative for chills, fever and weight loss.  HENT: Negative for ear discharge, ear pain and nosebleeds.   Eyes: Negative for blurred vision, pain and discharge.  Respiratory: Negative for sputum production, shortness of breath, wheezing and stridor.   Cardiovascular: Negative for chest pain, palpitations, orthopnea and PND.  Gastrointestinal: Positive for abdominal pain. Negative for diarrhea, nausea and vomiting.  Genitourinary: Negative for frequency and urgency.  Musculoskeletal: Negative for back pain and joint pain.  Neurological: Negative for sensory change, speech change, focal weakness and weakness.  Psychiatric/Behavioral: Negative for depression and hallucinations. The patient is not nervous/anxious.    Tolerating Diet:CLD Tolerating PT: ambulatory at home  DRUG ALLERGIES:   Allergies  Allergen Reactions  . Contrast Media [Iodinated Diagnostic Agents]   . Diphenhydramine Hcl Hives and Itching  . Adhesive [Tape]     PAPER TAPE OK  . Iodine Other (See Comments)  . Prednisone Anxiety  . Sulfa Antibiotics Rash    VITALS:  Blood pressure 127/89, pulse 65, temperature 98.1 F (36.7 C), temperature source Oral, resp. rate 20, weight 58 kg, SpO2 98 %.  PHYSICAL EXAMINATION:   Physical Exam  GENERAL:  53 y.o.-year-old patient lying in the bed with all to moderate acute distress.  EYES: Pupils equal, round, reactive to light and accommodation. No scleral icterus. Extraocular muscles intact.  HEENT: Head atraumatic, normocephalic. Oropharynx and nasopharynx clear. NECK:  Supple, no jugular venous distention. No  thyroid enlargement, no tenderness.  LUNGS: Normal breath sounds bilaterally, no wheezing, rales, rhonchi. No use of accessory muscles of respiration.  CARDIOVASCULAR: S1, S2 normal. No murmurs, rubs, or gallops.  ABDOMEN: Soft, + tender, non distended. . No organomegaly or mass. Surgical incision appears clean EXTREMITIES: No cyanosis, clubbing or edema b/l.    NEUROLOGIC: Cranial nerves II through XII are intact. No focal Motor or sensory deficits b/l.   PSYCHIATRIC:  patient is alert and oriented x 3.  SKIN: No obvious rash, lesion, or ulcer.   LABORATORY PANEL:  CBC Recent Labs  Lab 01/26/19 0653  WBC 10.3  HGB 12.4  HCT 38.5  PLT 276    Chemistries  Recent Labs  Lab 01/23/19 1310  01/26/19 0653  NA 140  --  139  K 4.6  --  3.5  CL 101  --  104  CO2 24  --  26  GLUCOSE 145*  --  104*  BUN 16  --  15  CREATININE 0.81   < > 0.59  CALCIUM 10.0  --  8.4*  AST 26  --   --   ALT 14  --   --   ALKPHOS 115  --   --   BILITOT 0.7  --   --    < > = values in this interval not displayed.   Cardiac Enzymes No results for input(s): TROPONINI in the last 168 hours. RADIOLOGY:  No results found. ASSESSMENT AND PLAN:  [name]  is a 53 y.o. female with a known history of depression, history of seizure, Jerrye Bushy, traumatic brain injury in 2009, chronic neck pain, history of cervical  cancer comes to the emergency room with complaints of abdominal pain which started yesterday. Patient said he started all of a sudden she could not stay comfortable. It would come and go. Was passing gas yesterday. Started vomiting this morning.   1. Small bowel obstruction/ileus -patient presented with abdominal pain and vomiting pain  -s/p POD#2 robotic assisted laparoscopic appendectomy with lysis of one  distal ileal adhesion. -cont IV fluids--d/c today -clear liquid diet---advance to FLD -flatus+ -pain medication to be managed by surgery Dr. Hampton Abbot  2. Uncontrolled blood pressure without  diagnosis of hypertension in the setting of abdominal pain -PRN IV labetalol -start clonidine 0.1 mg BID -BP much improved  3. Chronic pain/cervicalagia -patient takes soma at home -she also is on gabapentin-- will resume today   4. Gerd -IV Protonix bid-- change to oral Protonix  5. DVT prophylaxis -subcu heparin  Overall improving. Patient is in better spirits today. Asked her to ambulate more and out of bed to chair.   Family Communication: spoke with patient Consults: surgery Code Status: full DVT prophylaxis:  heparin   TOTAL TIME TAKING CARE OF THIS PATIENT: *30* minutes.  >50% time spent on counselling and coordination of care  POSSIBLE D/C IN *1-2* DAYS, DEPENDING ON CLINICAL CONDITION.  Note: This dictation was prepared with Dragon dictation along with smaller phrase technology. Any transcriptional errors that result from this process are unintentional.  Fritzi Mandes M.D on 01/26/2019 at 9:44 AM  Between 7am to 6pm - Pager - [phone]  After 6pm go to [email or web address]  [facility]   CC: Primary care physician; Remi Haggard, FNPPatient ID: [name], female   DOB: [birth year], 53 y.o.   MRN: [id number]

## 2019-01-26 NOTE — Progress Notes (Signed)
Subjective:  CC: [name] is a 53 y.o. female  Hospital stay day 3, 2 Days Post-Op diagnostic laparoscopy, LOA, appy  HPI: No issues overnight, passing flatus.  No BM  ROS:  General: Denies weight loss, weight gain, fatigue, fevers, chills, and night sweats. Heart: Denies chest pain, palpitations, racing heart, irregular heartbeat, leg pain or swelling, and decreased activity tolerance. Respiratory: Denies breathing difficulty, shortness of breath, wheezing, cough, and sputum. GI: Denies change in appetite, heartburn, nausea, vomiting, constipation, diarrhea, and blood in stool. GU: Denies difficulty urinating, pain with urinating, urgency, frequency, blood in urine.   Objective:   Temp:  [97.9 F (36.6 C)-98.2 F (36.8 C)] 98 F (36.7 C) (11/27 1410) Pulse Rate:  [65-67] 66 (11/27 1410) Resp:  [18-20] 18 (11/27 1410) BP: (90-164)/(56-101) 111/73 (11/27 1500) SpO2:  [97 %-98 %] 97 % (11/27 1410)       Weight: 58 kg     Intake/Output this shift:   Intake/Output Summary (Last 24 hours) at 01/26/2019 1745 Last data filed at 01/26/2019 1300 Gross per 24 hour  Intake 600 ml  Output 500 ml  Net 100 ml    Constitutional :  alert, cooperative, appears stated age and no distress  Respiratory:  clear to auscultation bilaterally  Cardiovascular:  regular rate and rhythm  Gastrointestinal: soft, TTP in all four qudrants, no distention, incisions c/d/i.   Skin: Cool and moist.   Psychiatric: Normal affect, non-agitated, not confused       LABS:  CMP Latest Ref Rng & Units 01/26/2019 01/24/2019 01/23/2019  Glucose 70 - 99 mg/dL 104(H) - 145(H)  BUN 6 - 20 mg/dL 15 - 16  Creatinine 0.44 - 1.00 mg/dL 0.59 0.62 0.81  Sodium 135 - 145 mmol/L 139 - 140  Potassium 3.5 - 5.1 mmol/L 3.5 - 4.6  Chloride 98 - 111 mmol/L 104 - 101  CO2 22 - 32 mmol/L 26 - 24  Calcium 8.9 - 10.3 mg/dL 8.4(L) - 10.0  Total Protein 6.5 - 8.1 g/dL - - 8.7(H)  Total Bilirubin 0.3 - 1.2 mg/dL - - 0.7   Alkaline Phos 38 - 126 U/L - - 115  AST 15 - 41 U/L - - 26  ALT 0 - 44 U/L - - 14   CBC Latest Ref Rng & Units 01/26/2019 01/25/2019 01/24/2019  WBC 4.0 - 10.5 K/uL 10.3 17.8(H) 20.4(H)  Hemoglobin 12.0 - 15.0 g/dL 12.4 13.5 15.8(H)  Hematocrit 36.0 - 46.0 % 38.5 40.8 47.0(H)  Platelets 150 - 400 K/uL 276 322 349    RADS: n/a Assessment:   S/p diagnostic laparoscopy, LOA, appy.  Tolerated clears, passing flatus, no distention, but complains of pain on palpation.  Will see how she does with FLD, since she states she feels better.  Hx of chronic pain issues.

## 2019-01-26 NOTE — Care Management Important Message (Signed)
Important Message  Patient Details  Name: [name] MRN: [id number] Date of Birth: 06/01/1965   Medicare Important Message Given:  Yes     Dannette Barbara 01/26/2019, 12:23 PM
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
## 2019-01-27 MED ORDER — SIMETHICONE 80 MG PO CHEW
80.0000 mg | CHEWABLE_TABLET | Freq: Four times a day (QID) | ORAL | Status: DC
  Administered 2019-01-27 – 2019-01-28 (×7): 80 mg via ORAL
  Filled 2019-01-27 (×7): qty 1

## 2019-01-27 MED ORDER — IBUPROFEN 600 MG PO TABS
600.0000 mg | ORAL_TABLET | Freq: Three times a day (TID) | ORAL | Status: DC
  Administered 2019-01-27 – 2019-01-28 (×4): 600 mg via ORAL
  Filled 2019-01-27 (×7): qty 1

## 2019-01-27 NOTE — Progress Notes (Signed)
Subjective:  CC: [name] is a 53 y.o. female  Hospital stay day 4, 3 Days Post-Op diagnostic laparoscopy, LOA, appy  HPI: No issues overnight, still passing flatus.  No BM.  Tolerating full liquid.  Pain overall states slightly better, but feels bloated.  ROS:  General: Denies weight loss, weight gain, fatigue, fevers, chills, and night sweats. Heart: Denies chest pain, palpitations, racing heart, irregular heartbeat, leg pain or swelling, and decreased activity tolerance. Respiratory: Denies breathing difficulty, shortness of breath, wheezing, cough, and sputum. GI: Denies change in appetite, heartburn, nausea, vomiting, constipation, diarrhea, and blood in stool. GU: Denies difficulty urinating, pain with urinating, urgency, frequency, blood in urine.   Objective:   Temp:  [98 F (36.7 C)-98.6 F (37 C)] 98.3 F (36.8 C) (11/28 0843) Pulse Rate:  [55-69] 69 (11/28 0843) Resp:  [17-18] 17 (11/28 0422) BP: (90-134)/(56-80) 126/73 (11/28 0843) SpO2:  [96 %-98 %] 96 % (11/28 0422)       Weight: 58 kg     Intake/Output this shift:   Intake/Output Summary (Last 24 hours) at 01/27/2019 0953 Last data filed at 01/27/2019 [id number] Gross per 24 hour  Intake 720 ml  Output 800 ml  Net -80 ml    Constitutional :  alert, cooperative, appears stated age and no distress  Respiratory:  clear to auscultation bilaterally  Cardiovascular:  regular rate and rhythm  Gastrointestinal: soft, TTP in all four qudrants, no distention, incisions c/d/i.   Skin: Cool and moist.   Psychiatric: Normal affect, non-agitated, not confused       LABS:  CMP Latest Ref Rng & Units 01/26/2019 01/24/2019 01/23/2019  Glucose 70 - 99 mg/dL 104(H) - 145(H)  BUN 6 - 20 mg/dL 15 - 16  Creatinine 0.44 - 1.00 mg/dL 0.59 0.62 0.81  Sodium 135 - 145 mmol/L 139 - 140  Potassium 3.5 - 5.1 mmol/L 3.5 - 4.6  Chloride 98 - 111 mmol/L 104 - 101  CO2 22 - 32 mmol/L 26 - 24  Calcium 8.9 - 10.3 mg/dL 8.4(L) - 10.0   Total Protein 6.5 - 8.1 g/dL - - 8.7(H)  Total Bilirubin 0.3 - 1.2 mg/dL - - 0.7  Alkaline Phos 38 - 126 U/L - - 115  AST 15 - 41 U/L - - 26  ALT 0 - 44 U/L - - 14   CBC Latest Ref Rng & Units 01/26/2019 01/25/2019 01/24/2019  WBC 4.0 - 10.5 K/uL 10.3 17.8(H) 20.4(H)  Hemoglobin 12.0 - 15.0 g/dL 12.4 13.5 15.8(H)  Hematocrit 36.0 - 46.0 % 38.5 40.8 47.0(H)  Platelets 150 - 400 K/uL 276 322 349    RADS: n/a Assessment:   S/p diagnostic laparoscopy, LOA, appy.  Tolerated full liquid, passing flatus, no distention, but still complains of pain on palpation. Taking miralax daily.  Will d/c and switch to simethicone to see if improves pain.  Gas distention vs cramping from miralax, although clinically not looking very distended. Advance to regular diet.   Hx of chronic pain issues.

## 2019-01-27 NOTE — Progress Notes (Signed)
[facility] [place] NAME: [name]    MR#:  [id number]  DATE OF BIRTH:  21-Feb-1966  SUBJECTIVE:   POD # 3 Laparscopic/Robotic surgery for SBO with lysis of One adhesion  Looks better today. Has pain with sitting up. Flatus+ HA better than yday no fever Ambulated in the [place] earlier and now feels lot of upper abd pain REVIEW OF SYSTEMS:   Review of Systems  Constitutional: Negative for chills, fever and weight loss.  HENT: Negative for ear discharge, ear pain and nosebleeds.   Eyes: Negative for blurred vision, pain and discharge.  Respiratory: Negative for sputum production, shortness of breath, wheezing and stridor.   Cardiovascular: Negative for chest pain, palpitations, orthopnea and PND.  Gastrointestinal: Positive for abdominal pain. Negative for diarrhea, nausea and vomiting.  Genitourinary: Negative for frequency and urgency.  Musculoskeletal: Negative for back pain and joint pain.  Neurological: Negative for sensory change, speech change, focal weakness and weakness.  Psychiatric/Behavioral: Negative for depression and hallucinations. The patient is not nervous/anxious.    Tolerating Diet:regualr Tolerating PT: ambulatory at home  DRUG ALLERGIES:   Allergies  Allergen Reactions  . Contrast Media [Iodinated Diagnostic Agents]   . Diphenhydramine Hcl Hives and Itching  . Adhesive [Tape]     PAPER TAPE OK  . Iodine Other (See Comments)  . Prednisone Anxiety  . Sulfa Antibiotics Rash    VITALS:  Blood pressure 126/73, pulse 69, temperature 98.3 F (36.8 C), temperature source Oral, resp. rate 17, weight 58 kg, SpO2 96 %.  PHYSICAL EXAMINATION:   Physical Exam  GENERAL:  53 y.o.-year-old patient lying in the bed with all to moderate acute distress.  EYES: Pupils equal, round, reactive to light and accommodation. No scleral icterus. Extraocular muscles intact.  HEENT: Head atraumatic, normocephalic. Oropharynx and  nasopharynx clear. NECK:  Supple, no jugular venous distention. No thyroid enlargement, no tenderness.  LUNGS: Normal breath sounds bilaterally, no wheezing, rales, rhonchi. No use of accessory muscles of respiration.  CARDIOVASCULAR: S1, S2 normal. No murmurs, rubs, or gallops.  ABDOMEN: Soft, + tender, non distended. . No organomegaly or mass. Surgical incision appears clean EXTREMITIES: No cyanosis, clubbing or edema b/l.    NEUROLOGIC: Cranial nerves II through XII are intact. No focal Motor or sensory deficits b/l.   PSYCHIATRIC:  patient is alert and oriented x 3.  SKIN: No obvious rash, lesion, or ulcer.   LABORATORY PANEL:  CBC Recent Labs  Lab 01/26/19 0653  WBC 10.3  HGB 12.4  HCT 38.5  PLT 276    Chemistries  Recent Labs  Lab 01/23/19 1310  01/26/19 0653  NA 140  --  139  K 4.6  --  3.5  CL 101  --  104  CO2 24  --  26  GLUCOSE 145*  --  104*  BUN 16  --  15  CREATININE 0.81   < > 0.59  CALCIUM 10.0  --  8.4*  AST 26  --   --   ALT 14  --   --   ALKPHOS 115  --   --   BILITOT 0.7  --   --    < > = values in this interval not displayed.   Cardiac Enzymes No results for input(s): TROPONINI in the last 168 hours. RADIOLOGY:  No results found. ASSESSMENT AND PLAN:  [name]  is a 53 y.o. female with a known history of depression, history of  seizure, Jerrye Bushy, traumatic brain injury in 2009, chronic neck pain, history of cervical cancer comes to the emergency room with complaints of abdominal pain which started yesterday. Patient said he started all of a sudden she could not stay comfortable. It would come and go. Was passing gas yesterday. Started vomiting this morning.   1. Small bowel obstruction/ileus -patient presented with abdominal pain and vomiting pain  -s/p POD# 3 robotic assisted laparoscopic appendectomy with lysis of one  distal ileal adhesion. -clear liquid diet---advance to FLD--now on regular diet -flatus+ -pain medication to be managed by  surgery Dr. Perlie Mayo  2. Uncontrolled blood pressure without diagnosis of hypertension in the setting of abdominal pain -PRN IV labetalol -start clonidine 0.1 mg BID -BP much improved  3. Chronic pain/cervicalagia -patient takes soma at home -she also is on gabapentin-- will resume today  -follows with [facility]--[phone]  4. Gerd -IV Protonix bid-- changed to oral Protonix  5. DVT prophylaxis -subcu heparin  Overall improving. Patient is in better spirits today. Asked her to ambulate more and out of bed to chair.  If she tolerates po regular diet and feels ok-d/c later today else tomorrow am.   Family Communication: spoke with patient Consults: surgery Code Status: full DVT prophylaxis:  heparin   TOTAL TIME TAKING CARE OF THIS PATIENT: *25* minutes.  >50% time spent on counselling and coordination of care  POSSIBLE D/C IN *1-2* DAYS, DEPENDING ON CLINICAL CONDITION.  Note: This dictation was prepared with Dragon dictation along with smaller phrase technology. Any transcriptional errors that result from this process are unintentional.  Fritzi Mandes M.D on 01/27/2019 at 1:19 PM  Between 7am to 6pm - Pager - [phone]  After 6pm go to [email or web address]  [facility]   CC: Primary care physician; Remi Haggard, FNPPatient ID: [name], female   DOB: [birth year], 53 y.o.   MRN: [id number]

## 2019-01-28 ENCOUNTER — Encounter: Payer: Self-pay | Admitting: Internal Medicine

## 2019-01-28 MED ORDER — PHENOL 1.4 % MT LIQD
1.0000 | OROMUCOSAL | Status: DC | PRN
  Administered 2019-01-28: 1 via OROMUCOSAL
  Filled 2019-01-28 (×2): qty 177

## 2019-01-28 MED ORDER — ACETAMINOPHEN 325 MG PO TABS: 650.0000 mg | ORAL_TABLET | Freq: Three times a day (TID) | ORAL | 0 refills | Status: AC | PRN

## 2019-01-28 MED ORDER — DOCUSATE SODIUM 100 MG PO CAPS: 100.0000 mg | ORAL_CAPSULE | Freq: Two times a day (BID) | ORAL | 0 refills | Status: AC | PRN

## 2019-01-28 MED ORDER — IBUPROFEN 800 MG PO TABS: 800.0000 mg | ORAL_TABLET | Freq: Three times a day (TID) | ORAL | 0 refills | Status: DC | PRN

## 2019-01-28 NOTE — Discharge Instructions (Signed)
Laparoscopic Appendectomy, Care After This sheet gives you information about how to care for yourself after your procedure. Your doctor may also give you more specific instructions. If you have problems or questions, contact your doctor. Follow these instructions at home: Care for cuts from surgery (incisions)   Follow instructions from your doctor about how to take care of your cuts from surgery. Make sure you: ? Wash your hands with soap and water before you change your bandage (dressing). If you cannot use soap and water, use hand sanitizer. ? Change your bandage as told by your doctor. ? Leave stitches (sutures), skin glue, or skin tape (adhesive) strips in place. They may need to stay in place for 2 weeks or longer. If tape strips get loose and curl up, you may trim the loose edges. Do not remove tape strips completely unless your doctor says it is okay.  Do not take baths, swim, or use a hot tub until your doctor says it is okay. OK TO SHOWER 24HRS AFTER YOUR SURGERY.   Check your surgical cut area every day for signs of infection. Check for: ? More redness, swelling, or pain. ? More fluid or blood. ? Warmth. ? Pus or a bad smell. Activity  Do not drive or use heavy machinery while taking prescription pain medicine.  Do not play contact sports until your doctor says it is okay.  Do not drive for 24 hours if you were given a medicine to help you relax (sedative).  Rest as needed. Do not return to work or school until your doctor says it is okay. General instructions   tylenol and advil as needed for discomfort.  Please alternate between the two every four hours as needed for pain.     Use narcotics, if prescribed, only when tylenol and motrin is not enough to control pain.   325-650mg  every 8hrs to max of 3000mg /24hrs for the tylenol.     Advil up to 800mg  per dose every 8hrs as needed for pain.    To prevent or treat constipation while you are taking prescription pain  medicine, your doctor may recommend that you: ? Drink enough fluid to keep your pee (urine) clear or pale yellow. ? Take over-the-counter or prescription medicines. ? Eat foods that are high in fiber, such as fresh fruits and vegetables, whole grains, and beans. ? Limit foods that are high in fat and processed sugars, such as fried and sweet foods. Contact a doctor if:  You develop a rash.  You have more redness, swelling, or pain around your surgical cuts.  You have more fluid or blood coming from your surgical cuts.  Your surgical cuts feel warm to the touch.  You have pus or a bad smell coming from your surgical cuts.  You have a fever.  One or more of your surgical cuts breaks open. Get help right away if:  You have trouble breathing.  You have chest pain.  You have pain that is getting worse in your shoulders.  You faint or feel dizzy when you stand.  You have very bad pain in your belly (abdomen).  You are sick to your stomach (nauseous) for more than one day.  You have throwing up (vomiting) that lasts for more than one day.  You have leg pain. This information is not intended to replace advice given to you by your health care provider. Make sure you discuss any questions you have with your health care provider. Document Released: 11/25/2007 Document  Revised: 09/06/2015 Document Reviewed: 08/04/2015 Elsevier Interactive Patient Education  [employer].

## 2019-01-28 NOTE — Progress Notes (Signed)
[name] to be D/C'd Home per MD order.  Discussed prescriptions and follow up appointments with the patient. Prescriptions given to patient, medication list explained in detail. Pt verbalized understanding.  Allergies as of 01/28/2019      Reactions   Contrast Media [iodinated Diagnostic Agents]    Diphenhydramine Hcl Hives, Itching   Adhesive [tape]    PAPER TAPE OK   Iodine Other (See Comments)   Prednisone Anxiety   Sulfa Antibiotics Rash      Medication List    STOP taking these medications   atorvastatin 40 MG tablet Commonly known as: LIPITOR     TAKE these medications   acetaminophen 325 MG tablet Commonly known as: Tylenol Take 2 tablets (650 mg total) by mouth every 8 (eight) hours as needed for mild pain.   albuterol 108 (90 Base) MCG/ACT inhaler Commonly known as: VENTOLIN HFA INL 2 PFS PO Q 4 TO 6 H IF NEEDED   carisoprodol 350 MG tablet Commonly known as: SOMA Take 700 mg by mouth 2 (two) times daily.   desvenlafaxine 100 MG 24 hr tablet Commonly known as: PRISTIQ Take 100 mg by mouth daily. Take along with 50 mg tablet total 150 mg daily   desvenlafaxine 50 MG 24 hr tablet Commonly known as: PRISTIQ Take 50 mg by mouth daily. Take along with 100 mg tablet total 150 mg daily   docusate sodium 100 MG capsule Commonly known as: Colace Take 1 capsule (100 mg total) by mouth 2 (two) times daily as needed for up to 10 days for mild constipation.   Fetzima 80 MG Cp24 Generic drug: Levomilnacipran HCl ER Take by mouth.   fexofenadine 180 MG tablet Commonly known as: ALLEGRA Take 180 mg by mouth daily.   gabapentin 300 MG capsule Commonly known as: NEURONTIN Take 300 mg by mouth 3 (three) times daily.   ibuprofen 800 MG tablet Commonly known as: ADVIL Take 1 tablet (800 mg total) by mouth every 8 (eight) hours as needed for mild pain or moderate pain.   lamoTRIgine 150 MG tablet Commonly known as: LAMICTAL Take 1/2 tab in the morning and 1 tab  at night for two weeks then increase to 1 tab twice a day and continue that dose   omeprazole 40 MG capsule Commonly known as: PRILOSEC Take 40 mg by mouth daily.   oxyCODONE 5 MG immediate release tablet Commonly known as: Oxy IR/ROXICODONE TK 1 T PO BID PRN   Spiriva Respimat 2.5 MCG/ACT Aers Generic drug: Tiotropium Bromide Monohydrate Inhale 2 puffs into the lungs daily.       Vitals:   01/28/19 1345 01/28/19 1448  BP: 121/80 120/78  Pulse: 65 61  Resp: 18   Temp: 98.7 F (37.1 C) 98.2 F (36.8 C)  SpO2: 100% 99%    Skin clean, dry and intact without evidence of skin break down, no evidence of skin tears noted. IV catheter discontinued intact. Site without signs and symptoms of complications. Dressing and pressure applied. Pt denies pain at this time. No complaints noted.  An After Visit Summary was printed and given to the patient. Patient escorted via [place], and D/C home via private auto.  Marry Guan

## 2019-01-28 NOTE — Discharge Summary (Signed)
Physician Discharge Summary  [name] [id number] DOB: 1965/10/05 DOA: 01/23/2019  PCP: Remi Haggard, FNP  Admit date: 01/23/2019 Discharge date: 01/28/2019  Discharge disposition: Home   Recommendations for Outpatient Follow-Up:   Follow-up with Dr. Christian Mate, general surgeon in 2 weeks.    Discharge Diagnosis:   Active Problems:   Abdominal pain   SBO (small bowel obstruction) (HCC)   Chronic neck pain   Nonintractable headache   HTN (hypertension), malignant    Discharge Condition: Stable.  Diet recommendation:   Code status: Full code.    Hospital Course:    [name] is a 53 year old woman with history of depression, seizure disorder, GERD, traumatic brain injury in 2011, chronic leg pain, history of cervical cancer, presented to the hospital with abdominal pain and vomiting.  Patient was found to have small bowel obstruction.  She was admitted to the hospitalist service and she was seen in consultation by the general surgeon.  She was treated with analgesics, IV fluids and required NG tube for gastric decompression.  Unfortunately, her condition did not improve so she was taken to the OR and she underwent robotic assisted laparoscopic appendectomy with lysis of distal ileum adhesion.  Postoperatively, her condition improved.  Discharge plan was discussed with general surgeon, Dr. Lysle Pearl, who said that patient was okay for discharge from his standpoint.    Medical Consultants:    General surgeon   Discharge Exam:   Vitals:   01/28/19 1345 01/28/19 1448  BP: 121/80 120/78  Pulse: 65 61  Resp: 18   Temp: 98.7 F (37.1 C) 98.2 F (36.8 C)  SpO2: 100% 99%   Vitals:   01/28/19 0537 01/28/19 0854 01/28/19 1345 01/28/19 1448  BP: 114/76 115/67 121/80 120/78  Pulse: (!) 52 (!) 59 65 61  Resp: 16 16 18    Temp: 97.6 F (36.4 C) 98.3 F (36.8 C) 98.7 F (37.1 C) 98.2 F (36.8 C)  TempSrc: Oral Oral Oral Oral  SpO2: 98% 100% 100% 99%   Weight:         GEN: NAD SKIN: No rash EYES: EOMI ENT: MMM CV: RRR PULM: CTA B ABD: Small surgical incisions look clean and intact, soft, ND, mild surgical tenderness, +BS CNS: AAO x 3, non focal EXT: No edema or tenderness   The results of significant diagnostics from this hospitalization (including imaging, microbiology, ancillary and laboratory) are listed below for reference.     Procedures and Diagnostic Studies:   Dg Abd 1 View  Result Date: 01/24/2019 CLINICAL DATA:  Small bowel obstruction. EXAM: ABDOMEN - 1 VIEW COMPARISON:  January 23, 2019. FINDINGS: Stable small bowel dilatation is noted concerning for distal small bowel obstruction. Distal tip of nasogastric tube is seen in expected position of proximal stomach. Phleboliths are noted in the pelvis. IMPRESSION: Distal tip of nasogastric tube is seen in expected position of proximal stomach. Stable small bowel dilatation is noted concerning for distal small bowel obstruction. Electronically Signed   By: Marijo Conception M.D.   On: 01/24/2019 08:08   Dg Abdomen 1 View  Result Date: 01/23/2019 CLINICAL DATA:  Nasogastric tube placement EXAM: ABDOMEN - 1 VIEW COMPARISON:  CT abdomen and pelvis January 23, 2019 FINDINGS: Nasogastric tube tip and side port are in the stomach. There are loops of mildly dilated small bowel. No air-fluid levels. No free air. Lung bases are clear. IMPRESSION: Nasogastric tube tip and side port in stomach. Loops of mildly dilated small bowel noted. No free  air. Lung bases clear. Electronically Signed   By: Lowella Grip III M.D.   On: 01/23/2019 18:07   Ct Renal Stone Study  Result Date: 01/23/2019 CLINICAL DATA:  Abdominal pain and vomiting. EXAM: CT ABDOMEN AND PELVIS WITHOUT CONTRAST TECHNIQUE: Multidetector CT imaging of the abdomen and pelvis was performed following the standard protocol without IV contrast. COMPARISON:  None. FINDINGS: Lower chest: Small hiatal hernia. Otherwise  negative. Hepatobiliary: No focal liver abnormality is seen. No gallstones, gallbladder wall thickening, or biliary dilatation. Pancreas: Unremarkable. No pancreatic ductal dilatation or surrounding inflammatory changes. Spleen: Normal in size without focal abnormality. Adrenals/Urinary Tract: Adrenal glands and kidneys appear normal. No hydronephrosis. Bladder appears normal. Vascular calcification in the hilum of the left kidney. Stomach/Bowel: There multiple dilated loops of small bowel to the level of the terminal ileum. The colon is not distended. Appendix appears normal. Stomach is normal except for a small hiatal hernia. Vascular/Lymphatic: Aortic atherosclerosis. No enlarged abdominal or pelvic lymph nodes. Reproductive: Uterus and bilateral adnexa are unremarkable. Other: No abdominal wall hernia or abnormality. No abdominopelvic ascites. Musculoskeletal: No acute bone abnormality. Previous lower lumbar fusion. IMPRESSION: Small bowel obstruction at the level of the terminal ileum. Electronically Signed   By: Lorriane Shire M.D.   On: 01/23/2019 16:22     Labs:   Basic Metabolic Panel: Recent Labs  Lab 01/23/19 1310 01/24/19 0331 01/26/19 0653  NA 140  --  139  K 4.6  --  3.5  CL 101  --  104  CO2 24  --  26  GLUCOSE 145*  --  104*  BUN 16  --  15  CREATININE 0.81 0.62 0.59  CALCIUM 10.0  --  8.4*   GFR Estimated Creatinine Clearance: 76.2 mL/min (by C-G formula based on SCr of 0.59 mg/dL). Liver Function Tests: Recent Labs  Lab 01/23/19 1310  AST 26  ALT 14  ALKPHOS 115  BILITOT 0.7  PROT 8.7*  ALBUMIN 4.8   Recent Labs  Lab 01/23/19 1310  LIPASE 30   No results for input(s): AMMONIA in the last 168 hours. Coagulation profile No results for input(s): INR, PROTIME in the last 168 hours.  CBC: Recent Labs  Lab 01/23/19 1310 01/24/19 0331 01/25/19 0908 01/26/19 0653  WBC 14.3* 20.4* 17.8* 10.3  HGB 16.4* 15.8* 13.5 12.4  HCT 48.6* 47.0* 40.8 38.5  MCV 89.3  89.5 90.7 92.1  PLT 345 349 322 276   Cardiac Enzymes: No results for input(s): CKTOTAL, CKMB, CKMBINDEX, TROPONINI in the last 168 hours. BNP: Invalid input(s): POCBNP CBG: Recent Labs  Lab 01/23/19 2131  GLUCAP 158*   D-Dimer No results for input(s): DDIMER in the last 72 hours. Hgb A1c No results for input(s): HGBA1C in the last 72 hours. Lipid Profile No results for input(s): CHOL, HDL, LDLCALC, TRIG, CHOLHDL, LDLDIRECT in the last 72 hours. Thyroid function studies No results for input(s): TSH, T4TOTAL, T3FREE, THYROIDAB in the last 72 hours.  Invalid input(s): FREET3 Anemia work up No results for input(s): VITAMINB12, FOLATE, FERRITIN, TIBC, IRON, RETICCTPCT in the last 72 hours. Microbiology Recent Results (from the past 240 hour(s))  SARS CORONAVIRUS 2 (TAT 6-24 HRS) Nasopharyngeal Nasopharyngeal Swab     Status: None   Collection Time: 01/24/19  3:31 AM   Specimen: Nasopharyngeal Swab  Result Value Ref Range Status   SARS Coronavirus 2 NEGATIVE NEGATIVE Final    Comment: (NOTE) SARS-CoV-2 target nucleic acids are NOT DETECTED. The SARS-CoV-2 RNA is generally detectable in  upper and lower respiratory specimens during the acute phase of infection. Negative results do not preclude SARS-CoV-2 infection, do not rule out co-infections with other pathogens, and should not be used as the sole basis for treatment or other patient management decisions. Negative results must be combined with clinical observations, patient history, and epidemiological information. The expected result is Negative. Fact Sheet for Patients: SugarRoll.be Fact Sheet for Healthcare Providers: https://www.woods-mathews.com/ This test is not yet approved or cleared by the [place] FDA and  has been authorized for detection and/or diagnosis of SARS-CoV-2 by FDA under an Emergency Use Authorization (EUA). This EUA will remain  in effect (meaning this  test can be used) for the duration of the COVID-19 declaration under Section 56 4(b)(1) of the Act, 21 U.S.C. section 360bbb-3(b)(1), unless the authorization is terminated or revoked sooner. Performed at [facility], [place] [address] [place], [place] [address]      Discharge Instructions:   Discharge Instructions    Diet - low sodium heart healthy   Complete by: As directed    Increase activity slowly   Complete by: As directed      Allergies as of 01/28/2019      Reactions   Contrast Media [iodinated Diagnostic Agents]    Diphenhydramine Hcl Hives, Itching   Adhesive [tape]    PAPER TAPE OK   Iodine Other (See Comments)   Prednisone Anxiety   Sulfa Antibiotics Rash      Medication List    STOP taking these medications   atorvastatin 40 MG tablet Commonly known as: LIPITOR     TAKE these medications   acetaminophen 325 MG tablet Commonly known as: Tylenol Take 2 tablets (650 mg total) by mouth every 8 (eight) hours as needed for mild pain.   albuterol 108 (90 Base) MCG/ACT inhaler Commonly known as: VENTOLIN HFA INL 2 PFS PO Q 4 TO 6 H IF NEEDED   carisoprodol 350 MG tablet Commonly known as: SOMA Take 700 mg by mouth 2 (two) times daily.   desvenlafaxine 100 MG 24 hr tablet Commonly known as: PRISTIQ Take 100 mg by mouth daily. Take along with 50 mg tablet total 150 mg daily   desvenlafaxine 50 MG 24 hr tablet Commonly known as: PRISTIQ Take 50 mg by mouth daily. Take along with 100 mg tablet total 150 mg daily   docusate sodium 100 MG capsule Commonly known as: Colace Take 1 capsule (100 mg total) by mouth 2 (two) times daily as needed for up to 10 days for mild constipation.   Fetzima 80 MG Cp24 Generic drug: Levomilnacipran HCl ER Take by mouth.   fexofenadine 180 MG tablet Commonly known as: ALLEGRA Take 180 mg by mouth daily.   gabapentin 300 MG capsule Commonly known as: NEURONTIN Take 300 mg by mouth 3 (three) times daily.    ibuprofen 800 MG tablet Commonly known as: ADVIL Take 1 tablet (800 mg total) by mouth every 8 (eight) hours as needed for mild pain or moderate pain.   lamoTRIgine 150 MG tablet Commonly known as: LAMICTAL Take 1/2 tab in the morning and 1 tab at night for two weeks then increase to 1 tab twice a day and continue that dose   omeprazole 40 MG capsule Commonly known as: PRILOSEC Take 40 mg by mouth daily.   oxyCODONE 5 MG immediate release tablet Commonly known as: Oxy IR/ROXICODONE TK 1 T PO BID PRN   Spiriva Respimat 2.5 MCG/ACT Aers Generic drug: Tiotropium Bromide  Monohydrate Inhale 2 puffs into the lungs daily.      Follow-up Information    Ronny Bacon, MD Follow up in 2 week(s).   Specialty: Surgery Why: post op Contact information: [address] [place] [address] [phone]            Time coordinating discharge: 28 minutes  Signed:  Jennye Boroughs  [facility] 01/28/2019, 5:23 PM

## 2019-01-29 LAB — SURGICAL PATHOLOGY

## 2019-01-30 ENCOUNTER — Encounter: Payer: Self-pay | Admitting: Emergency Medicine

## 2019-01-30 ENCOUNTER — Emergency Department: Payer: Medicare Other

## 2019-01-30 ENCOUNTER — Other Ambulatory Visit: Payer: Self-pay

## 2019-01-30 ENCOUNTER — Emergency Department
Admission: EM | Admit: 2019-01-30 | Discharge: 2019-01-30 | Disposition: A | Discharge status: Other Acute Inpatient Hospital | Payer: Medicare Other | Attending: Emergency Medicine | Admitting: Emergency Medicine

## 2019-01-30 DIAGNOSIS — R109 Unspecified abdominal pain: Secondary | ICD-10-CM

## 2019-01-30 DIAGNOSIS — Z8541 Personal history of malignant neoplasm of cervix uteri: Secondary | ICD-10-CM | POA: Diagnosis not present

## 2019-01-30 DIAGNOSIS — I609 Nontraumatic subarachnoid hemorrhage, unspecified: Secondary | ICD-10-CM | POA: Diagnosis not present

## 2019-01-30 DIAGNOSIS — R1030 Lower abdominal pain, unspecified: Secondary | ICD-10-CM | POA: Insufficient documentation

## 2019-01-30 DIAGNOSIS — I10 Essential (primary) hypertension: Secondary | ICD-10-CM | POA: Insufficient documentation

## 2019-01-30 DIAGNOSIS — F17210 Nicotine dependence, cigarettes, uncomplicated: Secondary | ICD-10-CM | POA: Diagnosis not present

## 2019-01-30 DIAGNOSIS — Z79899 Other long term (current) drug therapy: Secondary | ICD-10-CM | POA: Diagnosis not present

## 2019-01-30 DIAGNOSIS — K5900 Constipation, unspecified: Secondary | ICD-10-CM | POA: Diagnosis not present

## 2019-01-30 LAB — URINALYSIS, COMPLETE (UACMP) WITH MICROSCOPIC
Bacteria, UA: NONE SEEN
Bilirubin Urine: NEGATIVE
Glucose, UA: NEGATIVE mg/dL
Hgb urine dipstick: NEGATIVE
Ketones, ur: NEGATIVE mg/dL
Nitrite: NEGATIVE
Protein, ur: NEGATIVE mg/dL
Specific Gravity, Urine: 1.006 (ref 1.005–1.030)
pH: 8 (ref 5.0–8.0)

## 2019-01-30 LAB — CBC
HCT: 42.3 % (ref 36.0–46.0)
Hemoglobin: 14.3 g/dL (ref 12.0–15.0)
MCH: 29.9 pg (ref 26.0–34.0)
MCHC: 33.8 g/dL (ref 30.0–36.0)
MCV: 88.3 fL (ref 80.0–100.0)
Platelets: 320 10*3/uL (ref 150–400)
RBC: 4.79 MIL/uL (ref 3.87–5.11)
RDW: 13.1 % (ref 11.5–15.5)
WBC: 8.4 10*3/uL (ref 4.0–10.5)
nRBC: 0 % (ref 0.0–0.2)

## 2019-01-30 LAB — COMPREHENSIVE METABOLIC PANEL
ALT: 29 U/L (ref 0–44)
AST: 23 U/L (ref 15–41)
Albumin: 4.2 g/dL (ref 3.5–5.0)
Alkaline Phosphatase: 106 U/L (ref 38–126)
Anion gap: 10 (ref 5–15)
BUN: 9 mg/dL (ref 6–20)
CO2: 30 mmol/L (ref 22–32)
Calcium: 9.4 mg/dL (ref 8.9–10.3)
Chloride: 98 mmol/L (ref 98–111)
Creatinine, Ser: 0.66 mg/dL (ref 0.44–1.00)
GFR calc Af Amer: >60 mL/min (ref >60)
GFR calc non Af Amer: >60 mL/min (ref >60)
Glucose, Bld: 100 mg/dL — ABNORMAL HIGH (ref 70–99)
Potassium: 4.5 mmol/L (ref 3.5–5.1)
Sodium: 138 mmol/L (ref 135–145)
Total Bilirubin: 0.6 mg/dL (ref 0.3–1.2)
Total Protein: 7.3 g/dL (ref 6.5–8.1)

## 2019-01-30 LAB — LIPASE, BLOOD: Lipase: 21 U/L (ref 11–51)

## 2019-01-30 LAB — GLUCOSE, CAPILLARY
Glucose-Capillary: 84 mg/dL (ref 70–99)
Glucose-Capillary: 105 mg/dL — ABNORMAL HIGH (ref 70–99)

## 2019-01-30 LAB — POCT PREGNANCY, URINE: Preg Test, Ur: NEGATIVE

## 2019-01-30 MED ORDER — POLYETHYLENE GLYCOL 3350 17 GM/SCOOP PO POWD: 1.0000 | Freq: Once | ORAL | 0 refills | Status: AC

## 2019-01-30 MED ORDER — NICARDIPINE HCL IN NACL 20-0.86 MG/200ML-% IV SOLN
3.0000 mg/h | INTRAVENOUS | Status: DC
  Administered 2019-01-30: 3 mg/h via INTRAVENOUS
  Filled 2019-01-30: qty 200

## 2019-01-30 MED ORDER — HYDROMORPHONE HCL 1 MG/ML IJ SOLN
0.5000 mg | Freq: Once | INTRAMUSCULAR | Status: AC
  Administered 2019-01-30: 0.5 mg via INTRAVENOUS
  Filled 2019-01-30: qty 1

## 2019-01-30 MED ORDER — SODIUM CHLORIDE 0.9 % IV BOLUS
1000.0000 mL | Freq: Once | INTRAVENOUS | Status: AC
  Administered 2019-01-30: 1000 mL via INTRAVENOUS

## 2019-01-30 MED ORDER — PROMETHAZINE HCL 25 MG/ML IJ SOLN: 25.0000 mg | Freq: Once | INTRAMUSCULAR | Status: DC

## 2019-01-30 MED ORDER — PROCHLORPERAZINE EDISYLATE 10 MG/2ML IJ SOLN
10.0000 mg | Freq: Once | INTRAMUSCULAR | Status: AC
  Administered 2019-01-30: 10 mg via INTRAVENOUS
  Filled 2019-01-30: qty 2

## 2019-01-30 MED ORDER — HYDROCHLOROTHIAZIDE 25 MG PO TABS
25.0000 mg | ORAL_TABLET | Freq: Once | ORAL | Status: AC
  Administered 2019-01-30: 25 mg via ORAL
  Filled 2019-01-30: qty 1

## 2019-01-30 MED ORDER — ACETAMINOPHEN 325 MG PO TABS
650.0000 mg | ORAL_TABLET | Freq: Once | ORAL | Status: AC
  Administered 2019-01-30: 650 mg via ORAL
  Filled 2019-01-30: qty 2

## 2019-01-30 MED ORDER — DIMENHYDRINATE 50 MG/ML IJ SOLN: 50.0000 mg | Freq: Once | INTRAMUSCULAR | Status: DC

## 2019-01-30 MED ORDER — HYDROCHLOROTHIAZIDE 25 MG PO TABS: 25.0000 mg | ORAL_TABLET | Freq: Every day | ORAL | 2 refills | Status: DC

## 2019-01-30 MED ORDER — SODIUM CHLORIDE 0.9% FLUSH: 3.0000 mL | Freq: Once | INTRAVENOUS | Status: DC

## 2019-01-30 MED ORDER — HYDROCORTISONE NA SUCCINATE PF 250 MG IJ SOLR
200.0000 mg | Freq: Once | INTRAMUSCULAR | Status: AC
  Administered 2019-01-30: 200 mg via INTRAVENOUS
  Filled 2019-01-30: qty 200

## 2019-01-30 MED ORDER — ONDANSETRON HCL 4 MG/2ML IJ SOLN
4.0000 mg | Freq: Once | INTRAMUSCULAR | Status: AC
  Administered 2019-01-30: 4 mg via INTRAVENOUS
  Filled 2019-01-30: qty 2

## 2019-01-30 NOTE — ED Triage Notes (Signed)
Pt in via EMS from home with c/o abd pain. EMS reports pt has not had a BM in 10 days. Pt had laparoscopic abd surgery last Wednesday to rule out bowel obstruction and she had her appendix removed. Pt with HTN and a fever. BP 165/104. Pt does not take daily meds for HTN, 98.1T, 100% RA, HR 75

## 2019-01-30 NOTE — ED Notes (Signed)
Report given to sutter RN at [facility] ED at this time

## 2019-01-30 NOTE — ED Notes (Signed)
Pt called out reporting headache. Unable to give number for pain. Pt diaphoretic, and tossing around in bed.

## 2019-01-30 NOTE — ED Notes (Signed)
Pt was calling out sitting at end of bed requesting to leave but states that her head was 10/10 pain. MD came into room talking to pt. RN took BP and it was 136/112. Pt was holding head with left hand and then quickly slumped forward like she was passing out. Pt then assisted to lay back on bed by RN and MD. Pt repositioned in bed and still c/o  her head hurting

## 2019-01-30 NOTE — Consult Note (Signed)
Neurosurgery-New Consultation Evaluation 01/30/2019 [name] [id number]  Identifying Statement: [name] is a 53 y.o. female from [place] [id number] with worst headache of life  Physician Requesting Consultation: [place] [facility]  History of Present Illness: [name] is here with a severe headache that started today but she does endorse chronic headaches since a prior TBI. She was admitted recently for a SBO and appendectomy and started on medications for elevated BP.  She denies any falls or head trauma. She is noted to be diaphoretic and agitated. She has some photophobia but is not cooperative with further questioning. She had a CT of the head that showed some concern for [facility] near the vertex frontally. She is on no reported antiplatelet  Past Medical History:  Past Medical History:  Diagnosis Date  . BRBPR (bright red blood per rectum)   . Breast mass, left   . Cancer (HCC)    CERVICAL  . Depression   . Falls    EASILY  . GERD (gastroesophageal reflux disease)   . PTSD (post-traumatic stress disorder)   . Seizures ([place])   . 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 TBI (traumatic brain injury) ([place])    SKULL FX 2009  . Vertigo   . Vertigo     Social History: Social History   Socioeconomic History  . Marital status: Divorced    Spouse name: Not on file  . Number of children: 1  . Years of education: Not on file  . Highest education level: Associate degree: occupational, Hotel manager, or vocational program  Occupational History    Comment: DISABLED  Social Needs  . Financial resource strain: Not hard at all  . Food insecurity    Worry: Never true    Inability: Never true  . Transportation needs    Medical: No    Non-medical: No  Tobacco Use  . Smoking status: Current Every Day Smoker    Packs/day: 1.00    Types: Cigarettes  . Smokeless tobacco: Never Used  Substance and Sexual Activity  . Alcohol use: No  . Drug use: Yes    Types:  Marijuana  . Sexual activity: Never  Lifestyle  . Physical activity    Days per week: 0 days    Minutes per session: 0 min  . Stress: Not at all  Relationships  . Social Herbalist on phone: Three times a week    Gets together: More than three times a week    Attends religious service: More than 4 times per year    Active member of club or organization: No    Attends meetings of clubs or organizations: Never    Relationship status: Divorced  . Intimate partner violence    Fear of current or ex partner: No    Emotionally abused: No    Physically abused: No    Forced sexual activity: No  Other Topics Concern  . Not on file  Social History Narrative  . Not on file    Family History: Family History  Problem Relation Age of Onset  . Ovarian cancer Maternal Aunt        40's    Review of Systems:  Review of Systems - General ROS: Positive for agitation and diaphoresis Psychological ROS: Negative Ophthalmic ROS: Negative ENT ROS: Negative Hematological and Lymphatic ROS: Negative  Endocrine ROS: Negative Respiratory ROS: Negative Cardiovascular ROS: Negative Gastrointestinal ROS: Negative Genito-Urinary ROS: Negative Musculoskeletal  ROS: Negative Neurological ROS: Positive for headache Dermatological ROS: Negative  Physical Exam: BP (!) 176/106   Pulse 69   Temp 97.6 F (36.4 C)   Resp 17   Wt 58 kg   SpO2 100%   BMI 19.44 kg/m  Body mass index is 19.44 kg/m. Body surface area is 1.67 meters squared. General appearance: Alert but will not fully cooperate with examiner Head: Normocephalic, atraumatic Ext: No edema in LE bilaterally, warm extremities  Neurologic exam:  Mental status: alertness: alert, appears oriented but agitated Speech: fluent and clear, names 3/3 and repeats Cranial nerves:  Unable to test visual fields and facial movements given lack of cooperation VIII: hearing normal Motor:strength symmetric 5/5 on RUE and 4/5 in left but  with some decreased effort. She will wiggle toes to command but states hurts to do any strength testing Sensory: intact to light touch in all extremities Gait: not tested  Laboratory: Results for orders placed or performed during the hospital encounter of 01/30/19  Lipase, blood  Result Value Ref Range   Lipase 21 11 - 51 U/L  Comprehensive metabolic panel  Result Value Ref Range   Sodium 138 135 - 145 mmol/L   Potassium 4.5 3.5 - 5.1 mmol/L   Chloride 98 98 - 111 mmol/L   CO2 30 22 - 32 mmol/L   Glucose, Bld 100 (H) 70 - 99 mg/dL   BUN 9 6 - 20 mg/dL   Creatinine, Ser 0.66 0.44 - 1.00 mg/dL   Calcium 9.4 8.9 - 10.3 mg/dL   Total Protein 7.3 6.5 - 8.1 g/dL   Albumin 4.2 3.5 - 5.0 g/dL   AST 23 15 - 41 U/L   ALT 29 0 - 44 U/L   Alkaline Phosphatase 106 38 - 126 U/L   Total Bilirubin 0.6 0.3 - 1.2 mg/dL   GFR calc non Af Amer >60 >60 mL/min   GFR calc Af Amer >60 >60 mL/min   Anion gap 10 5 - 15  CBC  Result Value Ref Range   WBC 8.4 4.0 - 10.5 K/uL   RBC 4.79 3.87 - 5.11 MIL/uL   Hemoglobin 14.3 12.0 - 15.0 g/dL   HCT 42.3 36.0 - 46.0 %   MCV 88.3 80.0 - 100.0 fL   MCH 29.9 26.0 - 34.0 pg   MCHC 33.8 30.0 - 36.0 g/dL   RDW 13.1 11.5 - 15.5 %   Platelets 320 150 - 400 K/uL   nRBC 0.0 0.0 - 0.2 %  Urinalysis, Complete w Microscopic  Result Value Ref Range   Color, Urine STRAW (A) YELLOW   APPearance CLEAR (A) CLEAR   Specific Gravity, Urine 1.006 1.005 - 1.030   pH 8.0 5.0 - 8.0   Glucose, UA NEGATIVE NEGATIVE mg/dL   Hgb urine dipstick NEGATIVE NEGATIVE   Bilirubin Urine NEGATIVE NEGATIVE   Ketones, ur NEGATIVE NEGATIVE mg/dL   Protein, ur NEGATIVE NEGATIVE mg/dL   Nitrite NEGATIVE NEGATIVE   Leukocytes,Ua TRACE (A) NEGATIVE   RBC / HPF 0-5 0 - 5 RBC/hpf   WBC, UA 0-5 0 - 5 WBC/hpf   Bacteria, UA NONE SEEN NONE SEEN   Squamous Epithelial / LPF 0-5 0 - 5   Mucus PRESENT    Non Squamous Epithelial PRESENT (A) NONE SEEN  Glucose, capillary  Result Value Ref  Range   Glucose-Capillary 84 70 - 99 mg/dL  Glucose, capillary  Result Value Ref Range   Glucose-Capillary 105 (H) 70 - 99 mg/dL  Pregnancy,  urine POC  Result Value Ref Range   Preg Test, Ur NEGATIVE NEGATIVE   I personally reviewed labs  Imaging: CT Head: 1. Small volume acute subarachnoid hemorrhage overlying the anterior left frontal lobe. 2. Redemonstrated foci of chronic encephalomalacia within the left temporal and frontal lobes, likely posttraumatic. 3. Chronic right corona radiata lacunar infarct, better appreciatedon MRI 04/22/2017.  Impression/Plan:  Ms. Arispe is here for evaluation of WHOL and small amount of SAH near vertex. This does not have typical appearance of aneurysmal SAH but she does need vascular imaging. Her neurologic exam appears at baseline but she is uncooperative so I have recommended CTA but she requires steroid prep. We will start that. I do think she may need a formal angiogram and have recommended transfer for this. She does have abdominal issues and this will need to be evaluated by general surgery

## 2019-01-30 NOTE — ED Notes (Signed)
[facility] called for Neurosurgery for Dr Cinda Quest, images POWERSHARED by Ria Comment in [place].

## 2019-01-30 NOTE — ED Notes (Signed)
Pt assisted to toilet with large urinary output at this time.

## 2019-01-30 NOTE — ED Notes (Signed)
Pt extremely diaphoretic. Not very talkative, but was able to swallow PO medication without difficulty.

## 2019-01-30 NOTE — ED Notes (Signed)
Report given to andrew with [facility] who states aircraft is leaving and heading this way. Currently 45 mins out

## 2019-01-30 NOTE — ED Notes (Signed)
[facility] arrived to transport pt at this time

## 2019-01-30 NOTE — ED Triage Notes (Addendum)
Arrives stating BP has been "crazy" since discharge from [facility] Sunday night.  Patient states she had an appendectomy last Wednesday.  ALso c/o constipation.  States she is passing gas, but No BM since before surgery.  States 2 - 5mg  Oxycodone tabs taken this morning at 0730.  Patient is AAOx3.  Skin warm and dry. NAD

## 2019-01-30 NOTE — ED Notes (Signed)
Pt accepted to [facility] ER, Dr Laren Everts accepting, call report number [phone], [facility] [place] transfer coordinator number [phone]

## 2019-01-30 NOTE — ED Provider Notes (Addendum)
[facility] Emergency Department Provider Note   ____________________________________________   First MD Initiated Contact with Patient 01/30/19 1240     (approximate)  I have reviewed the triage vital signs and the nursing notes.   HISTORY  Chief Complaint Abdominal Pain   HPI [name] is a 53 y.o. female who was admitted on the 24th of last month with a small bowel obstruction.  This apparently was felt to be due to adhesions and she had an appendectomy with some occasions on the appendix removed.  Patient comes in today because of continued lower abdominal pain.  She reports she has not had a stool yet although she is passing gas.  Additionally she is worried about her blood pressure which is been elevated up to 172.  In the hospital she was getting clonidine and labetalol apparently and this controlled her blood pressure but she was not put on anything when she left.  She is worried about that.   The abdominal pain is dull and achy worse if she tries to sit up.  She is in chronic pain management.      Past Medical History:  Diagnosis Date   BRBPR (bright red blood per rectum)    Breast mass, left    Cancer (HCC)    CERVICAL   Depression    Falls    EASILY   GERD (gastroesophageal reflux disease)    PTSD (post-traumatic stress disorder)    Seizures (HCC)    Shoulder disorder    LIMITED USE OF LEFT SHOULDER AND ARM   TBI (traumatic brain injury) ([place])    SKULL FX 2009   Vertigo    Vertigo     Patient Active Problem List   Diagnosis Date Noted   Nonintractable headache    HTN (hypertension), malignant    SBO (small bowel obstruction) ([place]) 01/23/2019   Chronic neck pain    Vertigo 07/11/2017   Abdominal pain 11/15/2016   FH: colon polyps 11/15/2016   Elevated alkaline phosphatase level 11/15/2016   BRBPR (bright red blood per rectum) 11/15/2016   Other dysphagia 11/15/2016   Other constipation 11/15/2016    PTSD (post-traumatic stress disorder) 05/18/2016   Breast mass, left 01/08/2016   Anxiety and depression 01/08/2016   Chronic pain 01/08/2016   Seizure disorder ([place]) 01/08/2016   GERD (gastroesophageal reflux disease) 01/08/2016   Difficulty sleeping 08/05/2015   Mood disorder ([place]) 05/05/2015   Tobacco abuse counseling 04/15/2015   Crushing injury of neck 09/27/2011   Head injury 05/06/2011    Past Surgical History:  Procedure Laterality Date   abd pain     APPENDECTOMY     BACK SURGERY     LUMBAR FUSIONS X 2   BREAST SURGERY     CATARACT EXTRACTION W/PHACO Right 01/07/2015   Procedure: CATARACT EXTRACTION PHACO AND INTRAOCULAR LENS PLACEMENT ([place]);  Surgeon: Birder Robson, MD;  Location: [facility] ORS;  Service: Ophthalmology;  Laterality: Right;  [place] 0 AP% 0 CDE 0 fluid pack lot [id number]   CATARACT EXTRACTION W/PHACO Left 01/28/2015   Procedure: CATARACT EXTRACTION PHACO AND INTRAOCULAR LENS PLACEMENT ([facility]);  Surgeon: Birder Robson, MD;  Location: [facility] ORS;  Service: Ophthalmology;  Laterality: Left;  [place] 00:18 AP%7.1 CDE1.32 fluid pack lot # [id number]   LEEP     XI ROBOT ABDOMINAL PERINEAL RESECTION N/A 01/24/2019   Procedure: Robot assisted appendectomy ;  Surgeon: Ronny Bacon, MD;  Location: [facility] ORS;  Service: General;  Laterality: N/A;  Prior to Admission medications   Medication Sig Start Date End Date Taking? Authorizing Provider  acetaminophen (TYLENOL) 325 MG tablet Take 2 tablets (650 mg total) by mouth every 8 (eight) hours as needed for mild pain. 01/28/19 02/27/19  Lysle Pearl, Isami, DO  albuterol (PROVENTIL HFA;VENTOLIN HFA) 108 (90 Base) MCG/ACT inhaler INL 2 PFS PO Q 4 TO 6 H IF NEEDED 05/04/17   [provider]  carisoprodol (SOMA) 350 MG tablet Take 700 mg by mouth 2 (two) times daily.     [provider]  desvenlafaxine (PRISTIQ) 100 MG 24 hr tablet Take 100 mg by mouth daily. Take along with 50 mg tablet total 150 mg  daily    [provider]  desvenlafaxine (PRISTIQ) 50 MG 24 hr tablet Take 50 mg by mouth daily. Take along with 100 mg tablet total 150 mg daily 06/05/17   [provider]  docusate sodium (COLACE) 100 MG capsule Take 1 capsule (100 mg total) by mouth 2 (two) times daily as needed for up to 10 days for mild constipation. 01/28/19 02/07/19  Lysle Pearl, Isami, DO  fexofenadine (ALLEGRA) 180 MG tablet Take 180 mg by mouth daily.     [provider]  gabapentin (NEURONTIN) 300 MG capsule Take 300 mg by mouth 3 (three) times daily. 11/13/18   [provider]  hydrochlorothiazide (HYDRODIURIL) 25 MG tablet Take 1 tablet (25 mg total) by mouth daily. 01/30/19   Nena Polio, MD  ibuprofen (ADVIL) 800 MG tablet Take 1 tablet (800 mg total) by mouth every 8 (eight) hours as needed for mild pain or moderate pain. 01/28/19   Benjamine Sprague, DO  lamoTRIgine (LAMICTAL) 150 MG tablet Take 1/2 tab in the morning and 1 tab at night for two weeks then increase to 1 tab twice a day and continue that dose 06/20/17   [provider]  Levomilnacipran HCl ER ([place]) 80 MG CP24 Take by mouth.    [provider]  omeprazole (PRILOSEC) 40 MG capsule Take 40 mg by mouth daily.    [provider]  oxyCODONE (OXY IR/ROXICODONE) 5 MG immediate release tablet TK 1 T PO BID PRN 08/01/17   [provider]  polyethylene glycol powder (MIRALAX) 17 GM/SCOOP powder Take 255 g by mouth once for 1 dose. Mix 1 scoop in water as directed take 2 doses today and then 1 dose every day for about a week or until you have a bowel movement.  Please follow-up with your surgeon as planned and return here for worsening pain, fever or vomiting.  Take the hydrochlorothiazide 1 pill a day.  Have your regular doctor check on your blood pressure in about 2 weeks.  You may need to add something to the hydrochlorothiazide. 01/30/19 01/30/19  Nena Polio, MD  Tiotropium Bromide Monohydrate  (SPIRIVA RESPIMAT) 2.5 MCG/ACT AERS Inhale 2 puffs into the lungs daily.    [provider]    Allergies Contrast media [iodinated diagnostic agents], Diphenhydramine hcl, Adhesive [tape], Iodine, Prednisone, and Sulfa antibiotics  Family History  Problem Relation Age of Onset   Ovarian cancer Maternal Aunt        40's    Social History Social History   Tobacco Use   Smoking status: Current Every Day Smoker    Packs/day: 1.00    Types: Cigarettes   Smokeless tobacco: Never Used  Substance Use Topics   Alcohol use: No   Drug use: Yes    Types: Marijuana    Review  of Systems  Constitutional: No fever/chills Eyes: No visual changes. ENT: No sore throat. Cardiovascular: Denies chest pain. Respiratory: Denies shortness of breath. Gastrointestinal:  abdominal pain.  No nausea, no vomiting.  No diarrhea.   constipation. Genitourinary: Negative for dysuria. Musculoskeletal: Negative for back pain. Skin: Negative for rash. Neurological: Negative for headaches, focal weakness  ____________________________________________   PHYSICAL EXAM:  VITAL SIGNS: ED Triage Vitals  Enc Vitals Group     BP 01/30/19 1032 (!) 156/97     Pulse Rate 01/30/19 1032 67     Resp 01/30/19 1032 16     Temp 01/30/19 1032 98.9 F (37.2 C)     Temp Source 01/30/19 1032 Oral     SpO2 01/30/19 1032 98 %     Weight 01/30/19 1036 127 lb 13.9 oz (58 kg)     Height --      Head Circumference --      Peak Flow --      Pain Score 01/30/19 1036 8     Pain Loc --      Pain Edu? --      Excl. in [place]? --     Constitutional: Alert and oriented.  Looks uncomfortable Eyes: Conjunctivae are normal.  Head: Atraumatic. Nose: No congestion/rhinnorhea. Mouth/Throat: Mucous membranes are moist.  Oropharynx non-erythematous. Neck: No stridor.   Cardiovascular: Normal rate, regular rhythm. Grossly normal heart sounds.  Good peripheral circulation. Respiratory: Normal respiratory effort.  No  retractions. Lungs CTAB. Gastrointestinal: Soft somewhat tender to palpation and percussion in the suprapubic area and left lower quadrant.  No distention. No abdominal bruits. No CVA tenderness. Musculoskeletal: No lower extremity tenderness nor edema.   Neurologic:  Normal speech and language. No gross focal neurologic deficits are appreciated. No gait instability. Skin:  Skin is warm, dry and intact. No rash noted.  ____________________________________________   LABS (all labs ordered are listed, but only abnormal results are displayed)  Labs Reviewed  COMPREHENSIVE METABOLIC PANEL - Abnormal; Notable for the following components:      Result Value   Glucose, Bld 100 (*)    All other components within normal limits  LIPASE, BLOOD  CBC  URINALYSIS, COMPLETE (UACMP) WITH MICROSCOPIC  POCT PREGNANCY, URINE  POC URINE PREG, ED   ____________________________________________  EKG   ____________________________________________  RADIOLOGY  ED MD interpretation: CT read by radiology reviewed by me shows some stool in the colon no other explanation for her pain.  Official radiology report(s): Ct Abdomen Pelvis Wo Contrast  Result Date: 01/30/2019 CLINICAL DATA:  Abdominal pain and fevers, decreased bowel movements over the past several days EXAM: CT ABDOMEN AND PELVIS WITHOUT CONTRAST TECHNIQUE: Multidetector CT imaging of the abdomen and pelvis was performed following the standard protocol without IV contrast. COMPARISON:  01/23/2019 FINDINGS: Lower chest: No acute abnormality. Hepatobiliary: No focal liver abnormality is seen. No gallstones, gallbladder wall thickening, or biliary dilatation. Pancreas: Unremarkable. No pancreatic ductal dilatation or surrounding inflammatory changes. Spleen: Normal in size without focal abnormality. Adrenals/Urinary Tract: Adrenal glands are within normal limits. Bladder is well distended. No renal calculi or obstructive changes are noted.  Stomach/Bowel: Considerable retained fecal material is noted within the colon increased when compared with the prior exam. No definitive obstructive changes are seen. Changes consistent with prior appendectomy are seen. Small bowel appears within normal limits. The previously seen small bowel obstruction has resolved in the interval. The stomach is decompressed. Vascular/Lymphatic: Aortic atherosclerosis. No enlarged abdominal or pelvic lymph nodes. Reproductive: Uterus and bilateral  adnexa are unremarkable. Other: Small amount of free fluid is noted likely physiologic in nature. No other findings to suggest perforation are seen. Musculoskeletal: No acute or significant osseous findings. Previous lumbar fusion is noted. IMPRESSION: Resolution of previously seen small bowel obstruction. Changes consistent with moderate constipation within the colon increased when compared with the prior exam. No other focal abnormality is noted. Electronically Signed   By: Inez Catalina M.D.   On: 01/30/2019 14:22    ____________________________________________   PROCEDURES  Procedure(s) performed (including Critical Care): Critical care time 2 hours.  This includes repeatedly examining the patient talking to the patient and her family member and reviewing her belly CT and her head CT and speaking to the neurosurgeon and [facility] and our radiologist about how best to do a CT angio since she has had many multiple allergies and then getting her transferred talking to the [facility] team.  Then her blood pressure went up and we had to scramble to get her into a better monitored room and start nicardipine on her.  This involved me personally talking to the charge nurse as well.  Procedures discussed with radiologist we will try a different IV antihistamine.  There is diphenhydramine promethazine and I meant Hydrin 8 so we will try the Dramamine the last 1.  We will give this instead of  Benadryl   ____________________________________________   INITIAL IMPRESSION / ASSESSMENT AND PLAN / ED COURSE  Patient is complaining about her pain but she does have some pain medicine left.  Additionally she is on pain management protocol.  She is worried about her blood pressure.  I will try some hydrochlorothiazide to start with.  1 a day with 25 mg pills.  I have her follow-up with her doctor in about 2 weeks to see how it is doing and then have her follow-up with the surgeon.    [name] was evaluated in Emergency Department on 01/30/2019 for the symptoms described in the history of present illness. She was evaluated in the context of the global COVID-19 pandemic, which necessitated consideration that the patient might be at risk for infection with the SARS-CoV-2 virus that causes COVID-19. Institutional protocols and algorithms that pertain to the evaluation of patients at risk for COVID-19 are in a state of rapid change based on information released by regulatory bodies including the CDC and federal and state organizations. These policies and algorithms were followed during the patient's care in the ED.   ----------------------------------------- 4:20 PM on 01/30/2019 -----------------------------------------  Patient started complaining of worse headache after she got the Dilaudid.  I gave her some Compazine and some fluids.  Did not seem to help.  The headache is all over.  She says is been coming and going for several days no sudden onset of the headache.  She thought maybe it was her blood pressure.  Patient says she wanted to go and so we set her up and she was complaining of her headache we got a blood pressure on it was 136/112 she passed out briefly woke up immediately when we laid her back.  Still complaining of severe headache.  Again it is not sudden onset does not seem to be worse of her life.  However it is bad at present we will get a head  CT. ----------------------------------------- 5:25 PM on 01/30/2019 -----------------------------------------  CT read is subarachnoid hemorrhage. Dr. Lacinda Axon on-call for neurosurgery comes down to see the patient. Patient does have an IV contrast dye which is a  rash. We will give her the steroids to start the CT contrast dye allergy prep and see if we can transfer her to [place] as Dr. Lacinda Axon does not feel we could manage this if it is an aneurysmal bleed here.      ____________________________________________   FINAL CLINICAL IMPRESSION(S) / ED DIAGNOSES  Final diagnoses:  Abdominal pain, unspecified abdominal location  Constipation, unspecified constipation type     ED Discharge Orders         Ordered    polyethylene glycol powder (MIRALAX) 17 GM/SCOOP powder   Once     01/30/19 1442    hydrochlorothiazide (HYDRODIURIL) 25 MG tablet  Daily     01/30/19 1442           Note:  This document was prepared using Dragon voice recognition software and may include unintentional dictation errors.    Nena Polio, MD 01/30/19 1444    Nena Polio, MD 01/30/19 1726    Nena Polio, MD 01/30/19 1754    Nena Polio, MD 01/30/19 [phone]

## 2019-01-30 NOTE — ED Notes (Signed)
Pt assisted to toilet to urinate after medications administered

## 2019-01-30 NOTE — ED Notes (Signed)
Pt accepted to [facility] ER, Dr Laren Everts accepting, call report number [phone]

## 2019-01-30 NOTE — ED Notes (Signed)
Pt unable to sign for transfer due to AMS

## 2019-02-02 HISTORY — PX: OTHER SURGICAL HISTORY: SHX169

## 2019-02-15 ENCOUNTER — Other Ambulatory Visit: Payer: Self-pay

## 2019-02-15 ENCOUNTER — Ambulatory Visit (INDEPENDENT_AMBULATORY_CARE_PROVIDER_SITE_OTHER): Payer: Medicare Other | Admitting: Surgery

## 2019-02-15 ENCOUNTER — Encounter: Payer: Self-pay | Admitting: Surgery

## 2019-02-15 VITALS — BP 164/90 | HR 80 | Temp 96.1°F | Ht 68.0 in | Wt 135.0 lb

## 2019-02-15 DIAGNOSIS — Z9049 Acquired absence of other specified parts of digestive tract: Secondary | ICD-10-CM

## 2019-02-15 HISTORY — DX: Acquired absence of other specified parts of digestive tract: Z90.49

## 2019-02-15 NOTE — Patient Instructions (Addendum)
Discussed with patient that scar tissue will get better with time. Patient recommended trying a over the counter Fiber supplement.  High-Fiber Diet Fiber, also called dietary fiber, is a type of carbohydrate that is found in fruits, vegetables, whole grains, and beans. A high-fiber diet can have many health benefits. Your health care provider may recommend a high-fiber diet to help:  Prevent constipation. Fiber can make your bowel movements more regular.  Lower your cholesterol.  Relieve the following conditions: ? Swelling of veins in the anus (hemorrhoids). ? Swelling and irritation (inflammation) of specific areas of the digestive tract (uncomplicated diverticulosis). ? A problem of the large intestine (colon) that sometimes causes pain and diarrhea (irritable bowel syndrome, IBS).  Prevent overeating as part of a weight-loss plan.  Prevent heart disease, type 2 diabetes, and certain cancers. What is my plan? The recommended daily fiber intake in grams (g) includes:  51 g for men age 51 or younger.  51 g for men over age 51.  51 g for women age 51 or younger.  51 g for women over age 51. You can get the recommended daily intake of dietary fiber by:  Eating a variety of fruits, vegetables, grains, and beans.  Taking a fiber supplement, if it is not possible to get enough fiber through your diet. What do I need to know about a high-fiber diet?  It is better to get fiber through food sources rather than from fiber supplements. There is not a lot of research about how effective supplements are.  Always check the fiber content on the nutrition facts label of any prepackaged food. Look for foods that contain 5 g of fiber or more per serving.  Talk with a diet and nutrition specialist (dietitian) if you have questions about specific foods that are recommended or not recommended for your medical condition, especially if those foods are not listed below.  Gradually increase how much  fiber you consume. If you increase your intake of dietary fiber too quickly, you may have bloating, cramping, or gas.  Drink plenty of water. Water helps you to digest fiber. What are tips for following this plan?  Eat a wide variety of high-fiber foods.  Make sure that half of the grains that you eat each day are whole grains.  Eat breads and cereals that are made with whole-grain flour instead of refined flour or white flour.  Eat brown rice, bulgur wheat, or millet instead of white rice.  Start the day with a breakfast that is high in fiber, such as a cereal that contains 5 g of fiber or more per serving.  Use beans in place of meat in soups, salads, and pasta dishes.  Eat high-fiber snacks, such as berries, raw vegetables, nuts, and popcorn.  Choose whole fruits and vegetables instead of processed forms like juice or sauce. What foods can I eat?  Fruits Berries. Pears. Apples. Oranges. Avocado. Prunes and raisins. Dried figs. Vegetables Sweet potatoes. Spinach. Kale. Artichokes. Cabbage. Broccoli. Cauliflower. Green peas. Carrots. Squash. Grains Whole-grain breads. Multigrain cereal. Oats and oatmeal. Brown rice. Barley. Bulgur wheat. [place]. Quinoa. Bran muffins. Popcorn. Rye wafer crackers. Meats and other proteins Navy, kidney, and pinto beans. Soybeans. Split peas. Lentils. Nuts and seeds. Dairy Fiber-fortified yogurt. Beverages Fiber-fortified soy milk. Fiber-fortified orange juice. Other foods Fiber bars. The items listed above may not be a complete list of recommended foods and beverages. Contact a dietitian for more options. What foods are not recommended? Fruits Fruit juice. Cooked, strained  fruit. Vegetables Fried potatoes. Canned vegetables. Well-cooked vegetables. Grains White bread. Pasta made with refined flour. White rice. Meats and other proteins Fatty cuts of meat. Fried chicken or fried fish. Dairy Milk. Yogurt. Cream cheese. Sour cream. Fats and  oils Butters. Beverages Soft drinks. Other foods Cakes and pastries. The items listed above may not be a complete list of foods and beverages to avoid. Contact a dietitian for more information. Summary  Fiber is a type of carbohydrate. It is found in fruits, vegetables, whole grains, and beans.  There are many health benefits of eating a high-fiber diet, such as preventing constipation, lowering blood cholesterol, helping with weight loss, and reducing your risk of heart disease, diabetes, and certain cancers.  Gradually increase your intake of fiber. Increasing too fast can result in cramping, bloating, and gas. Drink plenty of water while you increase your fiber.  The best sources of fiber include whole fruits and vegetables, whole grains, nuts, seeds, and beans. This information is not intended to replace advice given to you by your health care provider. Make sure you discuss any questions you have with your health care provider. Document Released: 02/15/2005 Document Revised: 12/20/2016 Document Reviewed: 12/20/2016 Elsevier Patient Education  2020 [place] with our office as needed.   Please call and ask to speak with a nurse if you develop questions or concerns.

## 2019-02-15 NOTE — Progress Notes (Signed)
[facility] POST-OP OFFICE VISIT  02/15/2019  HPI: [name] is a 53 y.o. female 23 days s/p laparoscopic robotic assisted appendectomy with distal ileal lysis of adhesions for presumed small bowel obstruction.  She was discharged quite quickly postoperatively, only to return to the ED couple days later with severe hypertension and was diagnosed to have an intracranial hemorrhage for which she was transferred to [facility] for further care. Her biggest complaint postoperatively and in her convalescence has been her constipation, and she is become quite well acquainted with MiraLAX.  Otherwise no fevers or chills, no remarkable nausea vomiting, abdominal pain cramping.  She does feel she continues to have increased abdominal girth which may be related to her degree of constipation.  Vital signs: BP (!) 164/90   Pulse 80   Temp (!) 96.1 F (35.6 C) (Temporal)   Ht 5' 8" (1.727 m)   Wt 135 lb (61.2 kg)   SpO2 97%   BMI 20.53 kg/m    Physical Exam: Constitutional: She appears well actually, and much brighter and more energetic and expressive than when I first met her. Abdomen: Her epigastric incisions are all well-healed.  There is no tenderness on examination, there is no remarkable distention her bowel sounds are normal.  She is soft without masses. Skin: Normal, without any appreciable rashes, ecchymosis or laceration/abrasion.  Assessment/Plan: This is a 53 y.o. female 23 days s/p robotic assisted lysis of adhesions with appendectomy.  Patient Active Problem List   Diagnosis Date Noted  . Nonintractable headache   . HTN (hypertension), malignant   . SBO (small bowel obstruction) (HCC) 01/23/2019  . Chronic neck pain   . Vertigo 07/11/2017  . Abdominal pain 11/15/2016  . FH: colon polyps 11/15/2016  . Elevated alkaline phosphatase level 11/15/2016  . BRBPR (bright red blood per rectum) 11/15/2016  . Other dysphagia 11/15/2016  . Other constipation 11/15/2016  .  PTSD (post-traumatic stress disorder) 05/18/2016  . Breast mass, left 01/08/2016  . Anxiety and depression 01/08/2016  . Chronic pain 01/08/2016  . Seizure disorder (HCC) 01/08/2016  . GERD (gastroesophageal reflux disease) 01/08/2016  . Difficulty sleeping 08/05/2015  . Mood disorder (HCC) 05/05/2015  . Tobacco abuse counseling 04/15/2015  . Crushing injury of neck 09/27/2011  . Head injury 05/06/2011    -We discussed fiber, chronic constipation and bowel regimen.  She can follow-up with us as needed.     M.D., FACS 02/15/2019, 2:47 PM 

## 2019-02-16 ENCOUNTER — Encounter: Payer: Self-pay | Admitting: Surgery

## 2019-03-14 ENCOUNTER — Other Ambulatory Visit: Payer: Self-pay

## 2019-03-14 ENCOUNTER — Ambulatory Visit (INDEPENDENT_AMBULATORY_CARE_PROVIDER_SITE_OTHER): Payer: Medicare Other | Admitting: Family Medicine

## 2019-03-14 ENCOUNTER — Encounter: Payer: Self-pay | Admitting: Family Medicine

## 2019-03-14 VITALS — BP 122/84 | HR 86 | Temp 97.1°F | Resp 12 | Ht 68.0 in | Wt 136.7 lb

## 2019-03-14 DIAGNOSIS — I10 Essential (primary) hypertension: Secondary | ICD-10-CM | POA: Diagnosis not present

## 2019-03-14 DIAGNOSIS — F39 Unspecified mood [affective] disorder: Secondary | ICD-10-CM

## 2019-03-14 DIAGNOSIS — J449 Chronic obstructive pulmonary disease, unspecified: Secondary | ICD-10-CM

## 2019-03-14 DIAGNOSIS — Z716 Tobacco abuse counseling: Secondary | ICD-10-CM

## 2019-03-14 DIAGNOSIS — Z7689 Persons encountering health services in other specified circumstances: Secondary | ICD-10-CM

## 2019-03-14 DIAGNOSIS — F4310 Post-traumatic stress disorder, unspecified: Secondary | ICD-10-CM | POA: Diagnosis not present

## 2019-03-14 DIAGNOSIS — G40909 Epilepsy, unspecified, not intractable, without status epilepticus: Secondary | ICD-10-CM | POA: Diagnosis not present

## 2019-03-14 MED ORDER — HYDROCHLOROTHIAZIDE 25 MG PO TABS: 25.0000 mg | ORAL_TABLET | Freq: Every day | ORAL | 1 refills | Status: DC

## 2019-03-14 NOTE — Progress Notes (Signed)
Name: [name]   MRN: [id number]    DOB: 1965-05-30   Date:03/14/2019       Progress Note  Chief Complaint  Patient presents with  . New Patient (Initial Visit)  . Establish Care  . brain injury     Subjective:   [name] is a 54 y.o. female, presents to clinic for routine follow up on the conditions listed above.   Pt is new to establish care here she has complicated med and psych history as well as recent suspected traumatic subarachnoid hemorrhage, Ha's and elevated BP.  Recently discharged from last PCP office (?) after she was angry at some of her last visits.  She showed up for a visit and they told her she was no longer a patient there.  She has records with her, they printed them out for her.  Seen 01/30/2019 in [facility] ER for bleed  Imaging results:  IMPRESSION: 1. Small volume acute subarachnoid hemorrhage overlying the anterior left frontal lobe. 2. Redemonstrated foci of chronic encephalomalacia within the left temporal and frontal lobes, likely posttraumatic. 3. Chronic right corona radiata lacunar infarct, better appreciated on MRI 04/22/2017.  She is seeing both Neurology and neurosurgery per [facility] - few visits over the past 6 weeks with [facility] Dr. Luther Hearing neurosurgery, Dr. Baltazar Najjar Neurology  Previously seen by [facility] neuro, Dr. Melrose Nakayama  She has Feb 19th Dr. Luther Hearing - follow up - treating pain and bleed with fioricet ([facility]) She continues to have headache, but meds are keeping it manageable.    [facility] manages seizure disorders - on lamictal  He has referred her to a subspecialist by Melrose Nakayama   PT is on Pristiq - for depression, she has trouble with others, getting along with other, sometimes has trouble with moods and behavior, this has been ongoing since TBI, hx of depression prior to that pristiq dose is 150 mg 24 hour she takes daily, and has this med filled at [facility] [address]   HX of chronic lung disease - managed with spiriva and SABA, feels her sx  are well controlled  She take omeprazole ([facility]) forGERD, also well controlled with meds, notices if she misses a dose  New HTN: BP monitoring at home ranging 120-130 and then also several readings with higher, 140-160, most higher readings correlate with worse pain prior to taking pain meds Currently managed on HCTZ 25 mg daily Pt reports good med compliance and denies any SE.  No lightheadedness, hypotension, syncope. Blood pressure today is well controlled. 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   Her sister is here with her and helping with her medical care and making sure visits go well and she keeps tract of her meds, BP, OV, started to help after brain bleed and getting discharged from last PCP suddenly. Sister [name]     Patient Active Problem List   Diagnosis Date Noted  . S/P laparoscopic appendectomy 02/15/2019  . Nonintractable headache   . HTN (hypertension), malignant   . Chronic neck pain   . Vertigo 07/11/2017  . Abdominal pain  11/15/2016  . FH: colon polyps 11/15/2016  . Elevated alkaline phosphatase level 11/15/2016  . BRBPR (bright red blood per rectum) 11/15/2016  . Other dysphagia 11/15/2016  . Other constipation 11/15/2016  . PTSD (post-traumatic stress disorder) 05/18/2016  . Breast mass, left 01/08/2016  . Anxiety and depression 01/08/2016  . Chronic pain 01/08/2016  . Seizure disorder ([place]) 01/08/2016  . GERD (gastroesophageal reflux disease) 01/08/2016  . Difficulty sleeping 08/05/2015  . Mood disorder ([place])  05/05/2015  . Tobacco abuse counseling 04/15/2015  . Crushing injury of neck 09/27/2011  . Head injury 05/06/2011    Past Surgical History:  Procedure Laterality Date  . abd pain    . APPENDECTOMY    . BACK SURGERY     LUMBAR FUSIONS X 2  . CATARACT EXTRACTION W/PHACO Right 01/07/2015   Procedure: CATARACT EXTRACTION PHACO AND INTRAOCULAR LENS PLACEMENT (IOC);  Surgeon: Birder Robson, MD;  Location: [facility] ORS;  Service: Ophthalmology;  Laterality: Right;  [place] 0 AP% 0 CDE 0 fluid pack lot [id number]  . CATARACT EXTRACTION W/PHACO Left 01/28/2015   Procedure: CATARACT EXTRACTION PHACO AND INTRAOCULAR LENS PLACEMENT (IOC);  Surgeon: Birder Robson, MD;  Location: [facility] ORS;  Service: Ophthalmology;  Laterality: Left;  [place] 00:18 AP%7.1 CDE1.32 fluid pack lot # [id number]  . Cerebral Catheterization for Brain Bleed  02/02/2019   [facility]  . LEEP    . XI ROBOT ABDOMINAL PERINEAL RESECTION N/A 01/24/2019   Procedure: Robot assisted appendectomy ;  Surgeon: Ronny Bacon, MD;  Location: [facility] ORS;  Service: General;  Laterality: N/A;    Family History  Problem Relation Age of Onset  . Ovarian cancer Maternal Aunt        40's  . Depression Mother   . Heart disease Father   . Hypertension Father   . Diabetes Father   . Post-traumatic stress disorder Sister   . Hypertension Sister   . Diabetes Brother   . Depression Brother   . Hypertension Brother     Social History   Socioeconomic History  . Marital status: Divorced    Spouse name: Not on file  . Number of children: 1  . Years of education: Not on file  . Highest education level: Associate degree: occupational, Hotel manager, or vocational program  Occupational History    Comment: DISABLED  Tobacco Use  . Smoking status: Current Every Day Smoker    Packs/day: 1.00    Types: Cigarettes  . Smokeless tobacco: Never Used  Substance and Sexual Activity  . Alcohol use: No  . Drug use: Yes    Types: Marijuana  . Sexual  activity: Never  Other Topics Concern  . Not on file  Social History Narrative  . Not on file   Social Determinants of Health   Financial Resource Strain:   . Difficulty of Paying Living Expenses: Not on file  Food Insecurity:   . Worried About Charity fundraiser in the Last Year: Not on file  . Ran Out of Food in the Last Year: Not on file  Transportation Needs:   . Lack of Transportation (Medical): Not on file  . Lack of Transportation (Non-Medical): Not on file  Physical Activity:   . Days of Exercise per Week: Not on file  . Minutes of Exercise per Session: Not on file  Stress:   . Feeling of Stress : Not on file  Social Connections:   . Frequency of Communication with Friends and Family: Not on file  . Frequency  of Social Gatherings with Friends and Family: Not on file  . Attends Religious Services: Not on file  . Active Member of Clubs or Organizations: Not on file  . Attends Archivist Meetings: Not on file  . Marital Status: Not on file  Intimate Partner Violence:   . Fear of Current or Ex-Partner: Not on file  . Emotionally Abused: Not on file  . Physically Abused: Not on file  . Sexually Abused: Not on file     Current Outpatient Medications:  .  albuterol (PROVENTIL HFA;VENTOLIN HFA) 108 (90 Base) MCG/ACT inhaler, INL 2 PFS PO Q 4 TO 6 H IF NEEDED, Disp: , Rfl: 2 .  butalbital-acetaminophen-caffeine (FIORICET) 50-325-40 MG tablet, Take by mouth., Disp: , Rfl:  .  carisoprodol (SOMA) 350 MG tablet, Take 700 mg by mouth 2 (two) times daily. , Disp: , Rfl:  .  desvenlafaxine (PRISTIQ) 100 MG 24 hr tablet, Take 100 mg by mouth daily. Take along with 50 mg tablet total 150 mg daily, Disp: , Rfl:  .  desvenlafaxine (PRISTIQ) 50 MG 24 hr tablet, Take 50 mg by mouth daily. Take along with 100 mg tablet total 150 mg daily, Disp: , Rfl: 1 .  Docusate Sodium (DSS) 100 MG CAPS, TK ONE C PO BID PRF MILD CONSTIPATION, Disp: , Rfl:  .  fexofenadine (ALLEGRA) 180 MG  tablet, Take 180 mg by mouth daily. , Disp: , Rfl:  .  gabapentin (NEURONTIN) 300 MG capsule, Take 300 mg by mouth 3 (three) times daily., Disp: , Rfl:  .  hydrochlorothiazide (HYDRODIURIL) 25 MG tablet, Take 1 tablet (25 mg total) by mouth daily., Disp: 30 tablet, Rfl: 2 .  lamoTRIgine (LAMICTAL) 150 MG tablet, Take 1/2 tab in the morning and 1 tab at night for two weeks then increase to 1 tab twice a day and continue that dose, Disp: , Rfl:  .  omeprazole (PRILOSEC) 40 MG capsule, Take 40 mg by mouth daily., Disp: , Rfl:  .  oxyCODONE (OXY IR/ROXICODONE) 5 MG immediate release tablet, , Disp: , Rfl:  .  Tiotropium Bromide Monohydrate (SPIRIVA RESPIMAT) 2.5 MCG/ACT AERS, Inhale 2 puffs into the lungs daily., Disp: , Rfl:   Allergies  Allergen Reactions  . Contrast Media [Iodinated Diagnostic Agents]   . Diphenhydramine Hcl Hives and Itching  . Adhesive [Tape]     PAPER TAPE OK  . Iodine Other (See Comments)  . Prednisone Anxiety  . Sulfa Antibiotics Rash    Chart Review Today: I personally reviewed active problem list, medication list, allergies, family history, social history, health maintenance, notes from last encounter, lab results, imaging with the patient/caregiver today. Reviewed through care everywhere  Review of Systems  Constitutional: Negative.   HENT: Negative.   Eyes: Negative.   Respiratory: Negative.   Cardiovascular: Negative.   Gastrointestinal: Negative.   Endocrine: Negative.   Genitourinary: Negative.   Musculoskeletal: Negative.   Skin: Negative.   Allergic/Immunologic: Negative.   Neurological: Negative.   Hematological: Negative.   Psychiatric/Behavioral: Negative.   All other systems reviewed and are negative.    Objective:    Vitals:   03/14/19 1419  BP: 122/84  Pulse: 86  Resp: 12  Temp: (!) 97.1 F (36.2 C)  TempSrc: Temporal  SpO2: 98%  Weight: 136 lb 11.2 oz (62 kg)  Height: 5\' 8"  (1.727 m)    Body mass index is 20.79  kg/m.  Physical Exam Vitals and nursing note reviewed.  Constitutional:  General: She is not in acute distress.    Appearance: Normal appearance. She is well-developed. She is not ill-appearing, toxic-appearing or diaphoretic.     Interventions: Face mask in place.  HENT:     Head: Normocephalic and atraumatic.     Right Ear: External ear normal.     Left Ear: External ear normal.  Eyes:     General: Lids are normal. No scleral icterus.       Right eye: No discharge.        Left eye: No discharge.     Conjunctiva/sclera: Conjunctivae normal.  Neck:     Trachea: Phonation normal. No tracheal deviation.  Cardiovascular:     Rate and Rhythm: Normal rate and regular rhythm.     Pulses: Normal pulses.          Radial pulses are 2+ on the right side and 2+ on the left side.       Posterior tibial pulses are 2+ on the right side and 2+ on the left side.     Heart sounds: Normal heart sounds. No murmur. No friction rub. No gallop.   Pulmonary:     Effort: Pulmonary effort is normal. No respiratory distress.     Breath sounds: Normal breath sounds. No stridor. No wheezing, rhonchi or rales.  Chest:     Chest wall: No tenderness.  Abdominal:     General: Bowel sounds are normal. There is no distension.     Palpations: Abdomen is soft.     Tenderness: There is no abdominal tenderness. There is no guarding or rebound.  Musculoskeletal:        General: No deformity. Normal range of motion.     Cervical back: Normal range of motion and neck supple.     Right lower leg: No edema.     Left lower leg: No edema.  Lymphadenopathy:     Cervical: No cervical adenopathy.  Skin:    General: Skin is warm and dry.     Capillary Refill: Capillary refill takes less than 2 seconds.     Coloration: Skin is not jaundiced or pale.     Findings: No rash.  Neurological:     Mental Status: She is alert and oriented to person, place, and time.     Motor: No abnormal muscle tone.     Gait: Gait  normal.  Psychiatric:        Attention and Perception: Attention normal.        Mood and Affect: Mood is anxious. Mood is not depressed.        Speech: Speech normal.        Behavior: Behavior normal. Behavior is cooperative.        Thought Content: Thought content normal.        Cognition and Memory: Cognition normal.      PHQ2/9: Depression screen [facility] 2/9 03/14/2019  Decreased Interest 3  PHQ - 2 Score 3  Altered sleeping 3  Tired, decreased energy 3  Change in appetite 3  Feeling bad or failure about yourself  3  Trouble concentrating 3  Moving slowly or fidgety/restless 2  Suicidal thoughts 1  PHQ-9 Score 21  Difficult doing work/chores Extremely dIfficult    phq 9 is positive, sees psych on meds  Fall Risk: Fall Risk  03/14/2019 02/15/2019  Falls in the past year? 1 0  Number falls in past yr: 1 0  Injury with Fall? 1 0    Functional Status  Survey: Is the patient deaf or have difficulty hearing?: No Does the patient have difficulty seeing, even when wearing glasses/contacts?: No Does the patient have difficulty concentrating, remembering, or making decisions?: Yes Does the patient have difficulty walking or climbing stairs?: No Does the patient have difficulty dressing or bathing?: No Does the patient have difficulty doing errands alone such as visiting a doctor's office or shopping?: Yes   Assessment & Plan:     ICD-10-CM   1. Hypertension, unspecified type  I10 CMP w GFR    hydrochlorothiazide (HYDRODIURIL) 25 MG tablet   well controlled today, reviewed her home BP's many elevated likely secondary to pain, con't to monitor at home when relaxed, check labs  2. Encounter to establish care with new doctor  Z76.89    reviewed dx and recent hospitalizations, surgeries, specialist visits  3. PTSD (post-traumatic stress disorder)  F43.10 desvenlafaxine (PRISTIQ) 50 MG 24 hr tablet    desvenlafaxine (PRISTIQ) 100 MG 24 hr tablet   per psych  4. Seizure disorder  ([place])  G40.909    per neuro  5. Mood disorder (HCC)  F39 desvenlafaxine (PRISTIQ) 50 MG 24 hr tablet    desvenlafaxine (PRISTIQ) 100 MG 24 hr tablet   meds help, per psych  6. Tobacco abuse counseling  Z71.6   7. Chronic obstructive pulmonary disease, unspecified COPD type ([place])  J44.9    well controlled with current inhalers     Return for 3 month f/up.   [name], PA-C 03/14/19 2:50 PM

## 2019-03-15 LAB — COMPLETE METABOLIC PANEL WITH GFR
AG Ratio: 2 (calc) (ref 1.0–2.5)
ALT: 9 U/L (ref 6–29)
AST: 13 U/L (ref 10–35)
Albumin: 4.5 g/dL (ref 3.6–5.1)
Alkaline phosphatase (APISO): 105 U/L (ref 37–153)
BUN: 13 mg/dL (ref 7–25)
CO2: 33 mmol/L — ABNORMAL HIGH (ref 20–32)
Calcium: 10 mg/dL (ref 8.6–10.4)
Chloride: 98 mmol/L (ref 98–110)
Creat: 0.78 mg/dL (ref 0.50–1.05)
GFR, Est African American: 102 mL/min/{1.73_m2} (ref >=60)
GFR, Est Non African American: 88 mL/min/{1.73_m2} (ref >=60)
Globulin: 2.2 g/dL (calc) (ref 1.9–3.7)
Glucose, Bld: 91 mg/dL (ref 65–99)
Potassium: 4.1 mmol/L (ref 3.5–5.3)
Sodium: 138 mmol/L (ref 135–146)
Total Bilirubin: 0.3 mg/dL (ref 0.2–1.2)
Total Protein: 6.7 g/dL (ref 6.1–8.1)

## 2019-03-17 MED ORDER — DESVENLAFAXINE SUCCINATE ER 100 MG PO TB24: 100.0000 mg | ORAL_TABLET | Freq: Every day | ORAL | 1 refills | Status: DC

## 2019-03-17 MED ORDER — DESVENLAFAXINE SUCCINATE ER 50 MG PO TB24: 50.0000 mg | ORAL_TABLET | Freq: Every day | ORAL | 1 refills | Status: DC

## 2019-03-23 ENCOUNTER — Encounter: Payer: Self-pay | Admitting: Family Medicine

## 2019-03-26 MED ORDER — BUTALBITAL-APAP-CAFFEINE 50-325-40 MG PO TABS: 1.0000 | ORAL_TABLET | Freq: Four times a day (QID) | ORAL | 0 refills | Status: DC | PRN

## 2019-04-06 DIAGNOSIS — I609 Nontraumatic subarachnoid hemorrhage, unspecified: Secondary | ICD-10-CM | POA: Insufficient documentation

## 2019-04-06 DIAGNOSIS — G43719 Chronic migraine without aura, intractable, without status migrainosus: Secondary | ICD-10-CM | POA: Insufficient documentation

## 2019-04-06 HISTORY — DX: Nontraumatic subarachnoid hemorrhage, unspecified: I60.9

## 2019-04-17 ENCOUNTER — Encounter: Payer: Self-pay | Admitting: Family Medicine

## 2019-05-07 DIAGNOSIS — R519 Headache, unspecified: Secondary | ICD-10-CM | POA: Insufficient documentation

## 2019-05-07 DIAGNOSIS — Z8679 Personal history of other diseases of the circulatory system: Secondary | ICD-10-CM

## 2019-05-07 HISTORY — DX: Personal history of other diseases of the circulatory system: Z86.79

## 2019-05-08 ENCOUNTER — Encounter: Payer: Self-pay | Admitting: Psychiatry

## 2019-05-08 ENCOUNTER — Ambulatory Visit (INDEPENDENT_AMBULATORY_CARE_PROVIDER_SITE_OTHER): Payer: Medicare Other | Admitting: Psychiatry

## 2019-05-08 ENCOUNTER — Other Ambulatory Visit: Payer: Self-pay

## 2019-05-08 DIAGNOSIS — S069XAS Unspecified intracranial injury with loss of consciousness status unknown, sequela: Secondary | ICD-10-CM | POA: Insufficient documentation

## 2019-05-08 DIAGNOSIS — F482 Pseudobulbar affect: Secondary | ICD-10-CM | POA: Diagnosis not present

## 2019-05-08 DIAGNOSIS — F0630 Mood disorder due to known physiological condition, unspecified: Secondary | ICD-10-CM | POA: Diagnosis not present

## 2019-05-08 DIAGNOSIS — S069X9A Unspecified intracranial injury with loss of consciousness of unspecified duration, initial encounter: Secondary | ICD-10-CM | POA: Insufficient documentation

## 2019-05-08 DIAGNOSIS — S069X9S Unspecified intracranial injury with loss of consciousness of unspecified duration, sequela: Secondary | ICD-10-CM | POA: Diagnosis not present
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
## 2019-05-15 ENCOUNTER — Telehealth: Payer: Self-pay | Admitting: Psychiatry

## 2019-05-15 MED ORDER — DIVALPROEX SODIUM 500 MG PO DR TAB: 500.0000 mg | DELAYED_RELEASE_TABLET | Freq: Every day | ORAL | 1 refills | Status: DC

## 2019-05-15 NOTE — Telephone Encounter (Signed)
Prescription for Depakote DR 500 mg HS sent to pharmacy.

## 2019-05-15 NOTE — Telephone Encounter (Signed)
I called and spoke with Dr. Melrose Nakayama at [facility] neurology [phone]). I discussed my assessment findings with him of possibility of pseudobulbar affect and also if we could manage her mood irritability secondary to TBI with Depakote instead of Lamictal. Dr. Melrose Nakayama stated that he would be evaluating her soon and would reassess regarding pseudobulbar affect.  He was agreeable with the plan of discontinuing her Lamictal and switching her to Depakote for mood stabilization.  I called the patient and informed her regarding my conversation with Dr. Melrose Nakayama.  I explained to her the plan of discontinuing her Lamictal and starting Depakote to target her mood secondary to TBI.  Patient has taken Depakote in the past. Potential side effects of medication and risks vs benefits of treatment vs non-treatment were explained and discussed. All questions were answered.  Follow-up appointment with me was scheduled for April 16 at 11:30 AM.

## 2019-06-12 DIAGNOSIS — M25512 Pain in left shoulder: Secondary | ICD-10-CM | POA: Insufficient documentation

## 2019-06-12 DIAGNOSIS — R29898 Other symptoms and signs involving the musculoskeletal system: Secondary | ICD-10-CM | POA: Insufficient documentation

## 2019-06-12 DIAGNOSIS — M25511 Pain in right shoulder: Secondary | ICD-10-CM | POA: Insufficient documentation

## 2019-06-14 ENCOUNTER — Other Ambulatory Visit: Payer: Self-pay

## 2019-06-14 ENCOUNTER — Ambulatory Visit (INDEPENDENT_AMBULATORY_CARE_PROVIDER_SITE_OTHER): Payer: Medicare Other | Admitting: Family Medicine

## 2019-06-14 ENCOUNTER — Encounter: Payer: Self-pay | Admitting: Family Medicine

## 2019-06-14 VITALS — BP 112/68 | HR 94 | Temp 97.9°F | Resp 14 | Ht 68.0 in | Wt 140.0 lb

## 2019-06-14 DIAGNOSIS — S069X9S Unspecified intracranial injury with loss of consciousness of unspecified duration, sequela: Secondary | ICD-10-CM

## 2019-06-14 DIAGNOSIS — G40909 Epilepsy, unspecified, not intractable, without status epilepticus: Secondary | ICD-10-CM

## 2019-06-14 DIAGNOSIS — Z5181 Encounter for therapeutic drug level monitoring: Secondary | ICD-10-CM

## 2019-06-14 DIAGNOSIS — J449 Chronic obstructive pulmonary disease, unspecified: Secondary | ICD-10-CM

## 2019-06-14 DIAGNOSIS — G4489 Other headache syndrome: Secondary | ICD-10-CM

## 2019-06-14 DIAGNOSIS — F0630 Mood disorder due to known physiological condition, unspecified: Secondary | ICD-10-CM

## 2019-06-14 DIAGNOSIS — I10 Essential (primary) hypertension: Secondary | ICD-10-CM

## 2019-06-14 DIAGNOSIS — F1190 Opioid use, unspecified, uncomplicated: Secondary | ICD-10-CM

## 2019-06-14 DIAGNOSIS — K219 Gastro-esophageal reflux disease without esophagitis: Secondary | ICD-10-CM | POA: Diagnosis not present

## 2019-06-14 DIAGNOSIS — F482 Pseudobulbar affect: Secondary | ICD-10-CM

## 2019-06-14 DIAGNOSIS — I609 Nontraumatic subarachnoid hemorrhage, unspecified: Secondary | ICD-10-CM

## 2019-06-14 DIAGNOSIS — Z720 Tobacco use: Secondary | ICD-10-CM | POA: Insufficient documentation

## 2019-06-14 DIAGNOSIS — G8929 Other chronic pain: Secondary | ICD-10-CM

## 2019-06-14 DIAGNOSIS — S069XAS Unspecified intracranial injury with loss of consciousness status unknown, sequela: Secondary | ICD-10-CM

## 2019-06-14 MED ORDER — SPIRIVA RESPIMAT 2.5 MCG/ACT IN AERS: 2.0000 | INHALATION_SPRAY | Freq: Every day | RESPIRATORY_TRACT | 5 refills | Status: DC

## 2019-06-14 MED ORDER — LISINOPRIL-HYDROCHLOROTHIAZIDE 10-12.5 MG PO TABS: 1.0000 | ORAL_TABLET | Freq: Every day | ORAL | 3 refills | Status: DC

## 2019-06-14 MED ORDER — ALBUTEROL SULFATE HFA 108 (90 BASE) MCG/ACT IN AERS: INHALATION_SPRAY | RESPIRATORY_TRACT | 3 refills | Status: DC

## 2019-06-14 MED ORDER — OMEPRAZOLE 40 MG PO CPDR: 40.0000 mg | DELAYED_RELEASE_CAPSULE | Freq: Every day | ORAL | 3 refills | Status: DC

## 2019-06-14 NOTE — Patient Instructions (Signed)
Stop HCTZ and start lisinopril-HCTZ and we will follow up in a month

## 2019-06-14 NOTE — Progress Notes (Signed)
Name: [name]   MRN: [id number]    DOB: 11-Oct-1965   Date:06/14/2019       Progress Note  Chief Complaint  Patient presents with  . Follow-up  . Hypertension  . Migraine     Subjective:   [name] is a 54 y.o. female, presents to clinic for routine follow up on the conditions listed above.  Migraines/chronic HA/bleed/TBI - seeing neurology and neurosurgery, I sent in refill previously as a courtesy when they could not get ahold of the office.  They are working with a specialist on managing her pain she is continuing to use opioids for some of her pain management -on oxycodone immediate release but this does wear off in the afternoon and her pain is severe and this typically spikes her blood pressure.  She continues to take Fioricet, she has started nortriptyline and she is working with a specialist on trying to get [employer] approved but she has to show failure with other medications first before insurance will approve.    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  Today they have asked about who will be prescribing the Abilify, since it is a new medication and they have recently established with psychiatry of encourage them to follow up with psychiatry in the next 3 to 6 months.  In the future if multiple conditions for her have stabilized that is something that we could eventually take over prescribing.  I reviewed the most recent office visit, Dr. Toy Davies had consulted with neurology before changing meds that were previously related to seizure disorder, they did agree on medication changes and patient is following up this next week.   Hypertension:  Currently managed on  hydrochlorothiazide 25 mg daily Pt reports good med compliance and denies any SE.  No lightheadedness, hypotension, syncope. Blood pressure today is well controlled. BP Readings from Last 5 Encounters:  06/14/19 112/68  03/14/19 122/84  02/15/19 (!) 164/90  01/30/19 (!) 176/106  01/28/19 120/78  Pt denies CP, SOB, exertional sx, LE edema, palpitation, Ha's, visual disturbances Dietary efforts for BP?  None -patient does continue to smoke as well Her blood pressure is low and well-controlled usually early in the mornings.  She states when her pain medication begins to wear off in the early afternoon to about dinnertime this is typically when her blood pressure becomes extremely high.  Some of her recent visits to reflect this.  She states that at home sometimes her blood pressure is over "180/150".  This "freaks her out" or other times she feels as if her blood pressure is high and it makes it hard to breathe especially when she is out in pain and wearing a mask.   She does mention that her specialists are considering giving her extended release pain medication to help avoid this.  She also is trying very hard to use the pain medication as sparingly as possible and she does believe she is waiting too [place] to the pain becomes severe and and there is a delay after taking medications to get pain tolerable again  Chronic lung disease/COPD - managed with spiriva and SABA, feels her sx are well controlled, she denies any recent exacerbation, any worsening dyspnea on  exertion, wheeze, chronic cough, night sweats, unintentional weight loss or chest pain.  She does state that she needs a refill on his medications now she is not currently established with pulmonology.  Continues to smoke about 1 pack/day She does feel like her breathing is slightly worse when she is wearing a mask or little bit worse with allergies she is also on allergy medication and does have follow-up with ENT, was referred by  neurology  GERD: She is taking omeprazole 40 mg most days, she states that when she has her medications some days she can skip doses and symptoms are tolerable.  She does have some triggers with food or drinks she tries to avoid them.  She has been out of omeprazole for the past week so she is very symptomatic and does request refill today.  She denies any past GI evaluation, H pylori, and she is not using Pepcid or Zantac or any other as needed meds.  Denies any abdominal pain, melena, hematochezia, dysphagia   Sister [name]  is again here with her today -they are seeing all of her specialist trying to get her appointments and follow-ups done within a short period of time of each other for example all within this week and that way everybody is updated at the same time.  We have updated the med list today  Reviewed recent visits with Dr. Melrose Davies, neurology -reviewed several recent visits through Davies everywhere through March and April Seeing Dr. 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  Recently had spontaneous subarachnoid bleed December 2020 I have reviewed the controlled substance database/PDMP today controlled medications are prescribed by Dr. Humphrey Davies and [name]- I do not see any OV in chart or Davies everywhere re: their management.  Patient has recently been referred to ENT has an appointment next week  Patient Active Problem List   Diagnosis Date Noted  . Chronic obstructive pulmonary disease ([place]) 06/14/2019  . Tobacco use 06/14/2019  . Traumatic brain injury with loss  of consciousness ([place]) 05/08/2019  . 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 S/P laparoscopic appendectomy 02/15/2019  . Nonintractable headache   . HTN (hypertension), malignant   . Chronic neck pain   . Vertigo 07/11/2017  . Abdominal pain 11/15/2016  . FH: colon polyps 11/15/2016  . Elevated alkaline phosphatase level 11/15/2016  . BRBPR (bright red blood per rectum) 11/15/2016  . Other dysphagia 11/15/2016  . Other constipation 11/15/2016  . PTSD (post-traumatic stress disorder) 05/18/2016  . Breast mass, left 01/08/2016  . Anxiety and depression 01/08/2016  . Chronic pain 01/08/2016  . Seizure disorder ([place]) 01/08/2016  . GERD (gastroesophageal reflux disease) 01/08/2016  . Difficulty sleeping 08/05/2015  . Mood disorder ([place]) 05/05/2015  . Tobacco abuse counseling 04/15/2015  . Crushing injury of neck 09/27/2011  . Head injury 05/06/2011    Past Surgical History:  Procedure Laterality Date  . abd pain    . APPENDECTOMY    . BACK SURGERY     LUMBAR FUSIONS X 2  .  CATARACT EXTRACTION W/PHACO Right 01/07/2015   Procedure: CATARACT EXTRACTION PHACO AND INTRAOCULAR LENS PLACEMENT (IOC);  Surgeon: Birder Robson, MD;  Location: [facility] ORS;  Service: Ophthalmology;  Laterality: Right;  [place] 0 AP% 0 CDE 0 fluid pack lot [id number]  . CATARACT EXTRACTION W/PHACO Left 01/28/2015   Procedure: CATARACT EXTRACTION PHACO AND INTRAOCULAR LENS PLACEMENT (IOC);  Surgeon: Birder Robson, MD;  Location: [facility] ORS;  Service: Ophthalmology;  Laterality: Left;  [place] 00:18 AP%7.1 CDE1.32 fluid pack lot # [id number]  . Cerebral Catheterization for Brain Bleed  02/02/2019   [facility]  . LEEP    . XI ROBOT ABDOMINAL PERINEAL RESECTION N/A 01/24/2019   Procedure: Robot assisted appendectomy ;  Surgeon: Ronny Bacon, MD;  Location: [facility] ORS;  Service: General;  Laterality: N/A;    Family History  Problem Relation Age  of Onset  . Ovarian cancer Maternal Aunt        40's  . Depression Mother   . Heart disease Father   . Hypertension Father   . Diabetes Father   . Post-traumatic stress disorder Sister   . Hypertension Sister   . Diabetes Brother   . Depression Brother   . Hypertension Brother     Social History   Tobacco Use  . Smoking status: Current Every Day Smoker    Packs/day: 1.00    Types: Cigarettes  . Smokeless tobacco: Never Used  Substance Use Topics  . Alcohol use: No  . Drug use: Yes    Types: Marijuana      Current Outpatient Medications:  .  butalbital-acetaminophen-caffeine (FIORICET) 50-325-40 MG tablet, Take 1 tablet by mouth every 6 (six) hours as needed for headache., Disp: 30 tablet, Rfl: 0 .  carisoprodol (SOMA) 350 MG tablet, Take 700 mg by mouth 2 (two) times daily. , Disp: , Rfl:  .  divalproex (DEPAKOTE) 250 MG DR tablet, Take 250 mg by mouth every morning., Disp: , Rfl:  .  divalproex (DEPAKOTE) 500 MG DR tablet, Take 1 tablet (500 mg total) by mouth at bedtime., Disp: 30 tablet, Rfl: 1 .  Docusate Sodium (DSS) 100 MG CAPS, TK ONE C PO BID PRF MILD CONSTIPATION, Disp: , Rfl:  .  fexofenadine (ALLEGRA) 180 MG tablet, Take 180 mg by mouth daily. , Disp: , Rfl:  .  hydrochlorothiazide (HYDRODIURIL) 25 MG tablet, Take 1 tablet (25 mg total) by mouth daily., Disp: 90 tablet, Rfl: 1 .  nortriptyline (PAMELOR) 50 MG capsule, Take 50 mg by mouth Nightly., Disp: , Rfl:  .  oxyCODONE (OXY IR/ROXICODONE) 5 MG immediate release tablet, , Disp: , Rfl:  .  Tiotropium Bromide Monohydrate (SPIRIVA RESPIMAT) 2.5 MCG/ACT AERS, Inhale 2 puffs into the lungs daily., Disp: 4 g, Rfl: 5 .  albuterol (VENTOLIN HFA) 108 (90 Base) MCG/ACT inhaler, INL 2 PFS PO Q 4 TO 6 H IF NEEDED, Disp: 18 g, Rfl: 3 .  ARIPiprazole (ABILIFY) 5 MG tablet, Take 5 mg by mouth in the morning and at bedtime., Disp: , Rfl:  .  lisinopril-hydrochlorothiazide (ZESTORETIC) 10-12.5 MG tablet, Take 1 tablet by mouth  daily., Disp: 90 tablet, Rfl: 3 .  omeprazole (PRILOSEC) 40 MG capsule, Take 1 capsule (40 mg total) by mouth daily., Disp: 90 capsule, Rfl: 3  Allergies  Allergen Reactions  . Contrast Media [Iodinated Diagnostic Agents]   . Diphenhydramine Hcl Hives and Itching  . Adhesive [Tape]     PAPER TAPE OK  . Iodine Other (See Comments)  . Prednisone Anxiety  .  Sulfa Antibiotics Rash    Chart Review Today: I personally reviewed active problem list, medication list, allergies, family history, social history, health maintenance, notes from last encounter, lab results, imaging with the patient/caregiver today.   Review of Systems  10 Systems reviewed and are negative for acute change except as noted in the HPI.    Objective:    Vitals:   06/14/19 1152  BP: 112/68  Pulse: 94  Resp: 14  Temp: 97.9 F (36.6 C)  SpO2: 98%  Weight: 140 lb (63.5 kg)  Height: 5\' 8"  (1.727 m)    Body mass index is 21.29 kg/m.  Physical Exam Vitals and nursing note reviewed.  Constitutional:      General: She is not in acute distress.    Appearance: Normal appearance. She is well-developed and normal weight. She is not ill-appearing, toxic-appearing or diaphoretic.     Interventions: Face mask in place.  HENT:     Head: Normocephalic and atraumatic.     Right Ear: External ear normal.     Left Ear: External ear normal.  Eyes:     General: Lids are normal.        Right eye: No discharge.        Left eye: No discharge.     Conjunctiva/sclera: Conjunctivae normal.  Neck:     Trachea: Phonation normal. No tracheal deviation.  Cardiovascular:     Rate and Rhythm: Normal rate and regular rhythm.     Pulses: Normal pulses.          Radial pulses are 2+ on the right side and 2+ on the left side.       Posterior tibial pulses are 2+ on the right side and 2+ on the left side.     Heart sounds: Normal heart sounds. No murmur. No friction rub. No gallop.   Pulmonary:     Effort: Pulmonary effort is  normal. No respiratory distress.     Breath sounds: Normal breath sounds. No stridor. No wheezing, rhonchi or rales.  Chest:     Chest wall: No tenderness.  Abdominal:     General: Bowel sounds are normal. There is no distension.     Palpations: Abdomen is soft.     Tenderness: There is no abdominal tenderness.  Musculoskeletal:     Right lower leg: No edema.     Left lower leg: No edema.  Skin:    General: Skin is warm and dry.     Capillary Refill: Capillary refill takes less than 2 seconds.     Coloration: Skin is not jaundiced or pale.     Findings: No rash.  Neurological:     Mental Status: She is alert. Mental status is at baseline.     Motor: No abnormal muscle tone.     Gait: Gait normal.  Psychiatric:        Mood and Affect: Mood normal.        Speech: Speech normal.        Behavior: Behavior normal.      PHQ2/9: Depression screen [facility] 2/9 06/14/2019 03/14/2019  Decreased Interest 2 3  Down, Depressed, Hopeless 3 -  PHQ - 2 Score 5 3  Altered sleeping 3 3  Tired, decreased energy 3 3  Change in appetite 1 3  Feeling bad or failure about yourself  0 3  Trouble concentrating 1 3  Moving slowly or fidgety/restless 0 2  Suicidal thoughts 0 1  PHQ-9 Score 13 21  Difficult doing work/chores Somewhat difficult Extremely dIfficult    phq 9 is positive, reviewed - per psych, improving sx and phq score  Fall Risk: Fall Risk  06/14/2019 03/14/2019 02/15/2019  Falls in the past year? 0 1 0  Number falls in past yr: 0 1 0  Injury with Fall? 0 1 0    Functional Status Survey: Is the patient deaf or have difficulty hearing?: Yes Does the patient have difficulty seeing, even when wearing glasses/contacts?: Yes Does the patient have difficulty concentrating, remembering, or making decisions?: Yes Does the patient have difficulty walking or climbing stairs?: No Does the patient have difficulty dressing or bathing?: No Does the patient have difficulty doing errands alone  such as visiting a doctor's office or shopping?: No   Assessment & Plan:   1. HTN (hypertension), malignant Patient's blood pressure is well controlled today and has been low end of normal with hydrochlorothiazide 25 mg daily, her blood pressure spikes very high in afternoons and this has been documented at several recent doctor's appointments.  At home she states they are even higher 180/150?   Today blood pressure is 112/68 and I hesitate to add additional medications to cover for spikes in blood pressure that are likely secondary to uncontrolled pain, we did discuss possible hydralazine or clonidine (which I would hesitate to use, favor hydralazine if needed) For now will try changing meds: Stop HCTZ 25 mg  Start lisinopril-HCTZ 10-12.5   help with the lisinopril will be longer acting and reducing HCTZ well make sure that we do not cause her blood pressure to be too low. Did encourage her to follow-up with her pain management specialist I do think that a extended release pain medication will help minimize spikes in blood pressure better secondary to pain     ICD-10-CM   1. HTN (hypertension), malignant  I10 lisinopril-hydrochlorothiazide (ZESTORETIC) 10-12.5 MG tablet    COMPLETE METABOLIC PANEL WITH GFR   spikes in BP later in day which correlate with pain, change HCTZ 25 mg to lisinopril-HCTZ 10-12.5 to see if BP control is better, f/up w/ pain mgmt/neuro  2. Gastroesophageal reflux disease, unspecified whether esophagitis present  K21.9 omeprazole (PRILOSEC) 40 MG capsule   worsening sx with running out of omeprazole - refills sent in -discussed daily dosing, weaning when able adding Pepcid or Zantac as needed, food and lifestyle  3. Chronic obstructive pulmonary disease, unspecified COPD type (HCC)  J44.9 Tiotropium Bromide Monohydrate (SPIRIVA RESPIMAT) 2.5 MCG/ACT AERS    albuterol (VENTOLIN HFA) 108 (90 Base) MCG/ACT inhaler   sx well controlled currently, no recent exacerbations,  meds refilled  4. 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 Toy Davies, stopped Lamictal and Pristiq and started Depakote and Abilify   5. Pseudobulbar affect  F48.2    Per psych see above  6. Tobacco use  Z72.0    encouraged cessation   7. Seizure disorder ([place])  G40.909 divalproex (DEPAKOTE) 250 MG DR tablet   Per neurology  8. Other headache syndrome  G44.89 nortriptyline (PAMELOR) 50 MG capsule   neuro [facility] - on nortriptyline, Fioricet, trying to get [employer] approved  9. Encounter for medication monitoring  [id number] COMPLETE METABOLIC PANEL WITH GFR  10. Other chronic pain  G89.29   11. Opioid use, unspecified, uncomplicated  0000000   12. 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   [name], PA-C 06/14/19 6:43 PM

## 2019-06-15 ENCOUNTER — Telehealth: Payer: Medicare Other | Admitting: Psychiatry

## 2019-06-15 LAB — COMPLETE METABOLIC PANEL WITH GFR
AG Ratio: 1.7 (calc) (ref 1.0–2.5)
ALT: 9 U/L (ref 6–29)
AST: 15 U/L (ref 10–35)
Albumin: 4.5 g/dL (ref 3.6–5.1)
Alkaline phosphatase (APISO): 88 U/L (ref 37–153)
BUN: 15 mg/dL (ref 7–25)
CO2: 30 mmol/L (ref 20–32)
Calcium: 10 mg/dL (ref 8.6–10.4)
Chloride: 97 mmol/L — ABNORMAL LOW (ref 98–110)
Creat: 0.74 mg/dL (ref 0.50–1.05)
GFR, Est African American: 108 mL/min/{1.73_m2} (ref >=60)
GFR, Est Non African American: 93 mL/min/{1.73_m2} (ref >=60)
Globulin: 2.6 g/dL (calc) (ref 1.9–3.7)
Glucose, Bld: 85 mg/dL (ref 65–99)
Potassium: 4.4 mmol/L (ref 3.5–5.3)
Sodium: 140 mmol/L (ref 135–146)
Total Bilirubin: 0.3 mg/dL (ref 0.2–1.2)
Total Protein: 7.1 g/dL (ref 6.1–8.1)

## 2019-07-10 ENCOUNTER — Telehealth: Payer: Self-pay | Admitting: Family Medicine

## 2019-07-10 NOTE — Telephone Encounter (Signed)
Left message for patient to schedule Annual Wellness Visit.  Please schedule with Nurse Health Advisor KASEY UTHUS, RN.   

## 2019-07-16 ENCOUNTER — Ambulatory Visit (INDEPENDENT_AMBULATORY_CARE_PROVIDER_SITE_OTHER): Payer: 59 | Admitting: Family Medicine

## 2019-07-16 ENCOUNTER — Other Ambulatory Visit: Payer: Self-pay

## 2019-07-16 ENCOUNTER — Encounter: Payer: Self-pay | Admitting: Family Medicine

## 2019-07-16 VITALS — BP 128/74 | HR 100 | Temp 97.9°F | Resp 16 | Ht 68.0 in | Wt 143.7 lb

## 2019-07-16 DIAGNOSIS — S069X9S Unspecified intracranial injury with loss of consciousness of unspecified duration, sequela: Secondary | ICD-10-CM

## 2019-07-16 DIAGNOSIS — F0630 Mood disorder due to known physiological condition, unspecified: Secondary | ICD-10-CM

## 2019-07-16 DIAGNOSIS — F482 Pseudobulbar affect: Secondary | ICD-10-CM

## 2019-07-16 DIAGNOSIS — K219 Gastro-esophageal reflux disease without esophagitis: Secondary | ICD-10-CM

## 2019-07-16 DIAGNOSIS — I10 Essential (primary) hypertension: Secondary | ICD-10-CM

## 2019-07-16 DIAGNOSIS — R519 Headache, unspecified: Secondary | ICD-10-CM

## 2019-07-16 DIAGNOSIS — Z5181 Encounter for therapeutic drug level monitoring: Secondary | ICD-10-CM

## 2019-07-16 MED ORDER — SUCRALFATE 1 G PO TABS: 1.0000 g | ORAL_TABLET | Freq: Three times a day (TID) | ORAL | 1 refills | Status: DC | PRN

## 2019-07-16 MED ORDER — PANTOPRAZOLE SODIUM 40 MG PO TBEC: 40.0000 mg | DELAYED_RELEASE_TABLET | Freq: Two times a day (BID) | ORAL | 3 refills | Status: DC

## 2019-07-16 NOTE — Progress Notes (Signed)
Patient ID: [name], female    DOB: [birth year], 54 y.o.   MRN: [id number]  PCP: [name], PA-C  Chief Complaint  Patient presents with  . Hypertension    1 month follow up    Subjective:   [name] is a 54 y.o. 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  Overall pt feels better after med change, changed HCTZ to lisinopril HCTZ  She reports BP usually runs around 110/70 at home w/o severe pain and since starting lisinopril she denies SE or concerns thinks it helping her head aches a little to not have it spike so high in the afternoon BP Readings from Last 3 Encounters:  07/16/19 128/74  06/14/19 112/68  03/14/19 122/84     GERD is severe, she has tried avoiding food triggers, is on omeprazole 40 mg, has also taken mylanta/maalox, she reports daily burning severe no matter what she eats, reflux and burning from epigastric area constantly up to the back of her throat.  Since surgery in Nov when she had suspected SBO she feels generally bloated everywhere, no change to BMS, she denies cramping, says she can't lay on her stomach like she used to.  She asked surgeons about it but they told her that was not their area of medicine, she has not seen GI.   No melena, hematochezia, dysphagia    Patient Active Problem List   Diagnosis Date Noted  . Chronic obstructive pulmonary disease ([place]) 06/14/2019  . Tobacco use 06/14/2019  . 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 S/P laparoscopic appendectomy 02/15/2019  . Nonintractable headache   . HTN (hypertension), malignant   . Chronic neck pain   . Vertigo 07/11/2017  . Abdominal pain 11/15/2016  . FH: colon polyps 11/15/2016  . Elevated alkaline phosphatase level 11/15/2016  . BRBPR (bright red blood per rectum) 11/15/2016  . Other dysphagia 11/15/2016  . Other constipation 11/15/2016  . PTSD (post-traumatic stress disorder) 05/18/2016  . Breast mass, left 01/08/2016  . Anxiety and depression 01/08/2016  . Chronic pain 01/08/2016  . Seizure disorder ([place]) 01/08/2016  . GERD (gastroesophageal reflux disease) 01/08/2016  . Difficulty sleeping 08/05/2015  . Mood disorder ([place]) 05/05/2015  . Tobacco abuse counseling 04/15/2015  . Crushing injury of neck 09/27/2011  . Head injury 05/06/2011      Current Outpatient Medications:  .  albuterol (VENTOLIN HFA) 108 (90 Base) MCG/ACT inhaler, INL 2 PFS PO Q 4 TO 6 H IF NEEDED, Disp: 18 g, Rfl: 3 .  ARIPiprazole (ABILIFY) 5 MG tablet, Take 5 mg by mouth in the morning and at bedtime., Disp: , Rfl:  .  butalbital-acetaminophen-caffeine (FIORICET) 50-325-40 MG tablet, Take 1 tablet by mouth every 6 (six) hours as needed for headache., Disp: 30 tablet, Rfl: 0 .  carisoprodol (SOMA) 350 MG tablet, Take 700 mg by mouth 2 (two) times daily. , Disp: , Rfl:  .  divalproex (DEPAKOTE) 250 MG DR tablet, Take  250 mg by mouth every morning., Disp: , Rfl:  .  divalproex (DEPAKOTE) 500 MG DR tablet, Take 1 tablet (500 mg total) by mouth at bedtime., Disp: 30 tablet, Rfl: 1 .  Docusate Sodium (DSS) 100 MG CAPS, TK ONE C PO BID PRF MILD CONSTIPATION, Disp: , Rfl:  .  fexofenadine (ALLEGRA) 180 MG tablet, Take 180 mg by mouth daily. , Disp: , Rfl:  .  lisinopril-hydrochlorothiazide (ZESTORETIC) 10-12.5 MG tablet, Take 1 tablet by mouth daily., Disp: 90 tablet, Rfl: 3 .  nortriptyline (PAMELOR) 50 MG capsule, Take 50 mg by mouth Nightly., Disp: , Rfl:  .   omeprazole (PRILOSEC) 40 MG capsule, Take 1 capsule (40 mg total) by mouth daily., Disp: 90 capsule, Rfl: 3 .  oxyCODONE (OXY IR/ROXICODONE) 5 MG immediate release tablet, , Disp: , Rfl:  .  Tiotropium Bromide Monohydrate (SPIRIVA RESPIMAT) 2.5 MCG/ACT AERS, Inhale 2 puffs into the lungs daily., Disp: 4 g, Rfl: 5 .  hydrochlorothiazide (HYDRODIURIL) 25 MG tablet, Take 1 tablet (25 mg total) by mouth daily. (Patient not taking: Reported on 07/16/2019), Disp: 90 tablet, Rfl: 1   Allergies  Allergen Reactions  . Contrast Media [Iodinated Diagnostic Agents]   . Diphenhydramine Hcl Hives and Itching  . Adhesive [Tape]     PAPER TAPE OK  . Iodine Other (See Comments)  . Prednisone Anxiety  . Sulfa Antibiotics Rash     Family History  Problem Relation Age of Onset  . Ovarian cancer Maternal Aunt        40's  . Depression Mother   . Heart disease Father   . Hypertension Father   . Diabetes Father   . Post-traumatic stress disorder Sister   . Hypertension Sister   . Diabetes Brother   . Depression Brother   . Hypertension Brother      Social History   Socioeconomic History  . Marital status: Divorced    Spouse name: Not on file  . Number of children: 1  . Years of education: Not on file  . Highest education level: Associate degree: occupational, Hotel manager, or vocational program  Occupational History    Comment: DISABLED  Tobacco Use  . Smoking status: Current Every Day Smoker    Packs/day: 1.00    Types: Cigarettes  . Smokeless tobacco: Never Used  Substance and Sexual Activity  . Alcohol use: No  . Drug use: Yes    Types: Marijuana  . Sexual activity: Never  Other Topics Concern  . Not on file  Social History Narrative  . Not on file   Social Determinants of Health   Financial Resource Strain:   . Difficulty of Paying Living Expenses:   Food Insecurity:   . Worried About Charity fundraiser in the Last Year:   . Arboriculturist in the Last Year:     Transportation Needs:   . Film/video editor (Medical):   Marland Kitchen Lack of Transportation (Non-Medical):   Physical Activity:   . Days of Exercise per Week:   . Minutes of Exercise per Session:   Stress:   . Feeling of Stress :   Social Connections:   . Frequency of Communication with Friends and Family:   . Frequency of Social Gatherings with Friends and Family:   . Attends Religious Services:   . Active Member of Clubs or Organizations:   . Attends Archivist Meetings:   Marland Kitchen Marital Status:   Intimate Partner Violence:   . Fear of  Current or Ex-Partner:   . Emotionally Abused:   Marland Kitchen Physically Abused:   . Sexually Abused:     Chart Review Today: I personally reviewed active problem list, medication list, allergies, family history, social history, health maintenance, notes from last encounter, lab results, imaging with the patient/caregiver today.   Review of Systems 10 Systems reviewed and are negative for acute change except as noted in the HPI.     Objective:   Vitals:   07/16/19 0903  BP: 128/74  Pulse: 100  Resp: 16  Temp: 97.9 F (36.6 C)  TempSrc: Temporal  SpO2: 97%  Weight: 143 lb 11.2 oz (65.2 kg)  Height: 5\' 8"  (1.727 m)    Body mass index is 21.85 kg/m.  Physical Exam Vitals and nursing note reviewed.  Constitutional:      General: She is not in acute distress.    Appearance: Normal appearance. She is not ill-appearing, toxic-appearing or diaphoretic.  HENT:     Right Ear: External ear normal.     Left Ear: External ear normal.     Nose: Nose normal.  Cardiovascular:     Rate and Rhythm: Normal rate and regular rhythm.     Pulses: Normal pulses.     Heart sounds: Normal heart sounds. No murmur. No friction rub. No gallop.   Pulmonary:     Effort: Pulmonary effort is normal.     Breath sounds: Normal breath sounds.  Musculoskeletal:     Right lower leg: No edema.     Left lower leg: No edema.  Skin:    General: Skin is warm and dry.      Coloration: Skin is not jaundiced or pale.  Neurological:     Mental Status: She is alert. Mental status is at baseline.  Psychiatric:        Mood and Affect: Mood normal.        Behavior: Behavior normal.      Results for orders placed or performed in visit on 06/14/19  COMPLETE METABOLIC PANEL WITH GFR  Result Value Ref Range   Glucose, Bld 85 65 - 99 mg/dL   BUN 15 7 - 25 mg/dL   Creat 0.74 0.50 - 1.05 mg/dL   GFR, Est Non African American 93 > OR = 60 mL/min/1.64m2   GFR, Est African American 108 > OR = 60 mL/min/1.74m2   BUN/Creatinine Ratio NOT APPLICABLE 6 - 22 (calc)   Sodium 140 135 - 146 mmol/L   Potassium 4.4 3.5 - 5.3 mmol/L   Chloride 97 (L) 98 - 110 mmol/L   CO2 30 20 - 32 mmol/L   Calcium 10.0 8.6 - 10.4 mg/dL   Total Protein 7.1 6.1 - 8.1 g/dL   Albumin 4.5 3.6 - 5.1 g/dL   Globulin 2.6 1.9 - 3.7 g/dL (calc)   AG Ratio 1.7 1.0 - 2.5 (calc)   Total Bilirubin 0.3 0.2 - 1.2 mg/dL   Alkaline phosphatase (APISO) 88 37 - 153 U/L   AST 15 10 - 35 U/L   ALT 9 6 - 29 U/L        Assessment & Plan:    1. HTN (hypertension) Well controlled with med change to lisinopril-HCTZ, she notes improved sx, less spikes in BP and slightly lessened HA severity related to improved BP control.  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 Gastroesophageal reflux disease, unspecified whether esophagitis present Unstable, worsening reflux from epigastric area to the back of throat, unimproved with lifestyle changes, avoiding food triggers, and taking omeprazole and other over-the-counter medications.  DC omeprazole and start Protonix at high dose taking 40 mg twice daily -for 2  to 4 weeks, if she starts to improve I did encourage her to gradually decrease from twice a day dosing to daily in the morning dosing to see if she can tolerate that Encouraged her to use over-the-counter medications in addition to this as needed, she can also try Carafate prior to eating to help with pain and burning symptoms over the next couple weeks   If no improvement in 1 to 2 months of encouraged her to let me know if we can get her to a gastroenterologist for further evaluation, she has had moderate to severe GI symptoms since her hospitalization last November in addition to GERD and what sounds like likely esophagitis and gastritis she has generalized abdominal bloating  - pantoprazole (PROTONIX) 40 MG tablet; Take 1 tablet (40 mg total) by mouth 2 (two) times daily.  Dispense: 180 tablet; Refill: 3 - sucralfate (CARAFATE) 1 g tablet; Take 1 tablet (1 g total) by mouth 3 (three) times daily with meals as needed. for GERD/reflux/epigastric pain when eating or drinking  Dispense: 30 tablet; Refill: 1  3. Pseudobulbar affect On Abilify and seeing psychiatry -moods and behavior appear improved since starting this medication and seeing the psychiatrist She is more calm in the room less fidgety.  She reports that her rage is still quite a problem but overall her moods and her ability to reflect on her actions and her emotions has improved.  4. 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 Nonintractable headache, unspecified chronicity pattern, unspecified headache type Per neurology, still on nortriptyline, Depakote, Fioricet and narcotics from pain management Trying to get Emgality approved, she reports that they are going to try to submit again this next month and they expect to get the medication approved for her  6. Encounter for medication monitoring Doing well with HTN med changes Changed GERD meds/management   6 month routine f/up   Pt instructed to  notify me in the next 1-2 months if GERD/GI sx not improving     [name], PA-C 07/16/19 9:39 AM

## 2019-07-16 NOTE — Patient Instructions (Addendum)
Stop omeprazole   Start protonix 40 mg twice a day for the next 2-4 weeks Use carafate as needed or you can try mylanta or maalox to help occasionally with pain  Follow up in 1-2 months if GI symptoms (pain, reflux, bloating) are not better, we will need to get you to the GI specialist     Food Choices for Gastroesophageal Reflux Disease, Adult When you have gastroesophageal reflux disease (GERD), the foods you eat and your eating habits are very important. Choosing the right foods can help ease your discomfort. Think about working with a nutrition specialist (dietitian) to help you make good choices. What are tips for following this plan?  Meals  Choose healthy foods that are low in fat, such as fruits, vegetables, whole grains, low-fat dairy products, and lean meat, fish, and poultry.  Eat small meals often instead of 3 large meals a day. Eat your meals slowly, and in a place where you are relaxed. Avoid bending over or lying down until 2-3 hours after eating.  Avoid eating meals 2-3 hours before bed.  Avoid drinking a lot of liquid with meals.  Cook foods using methods other than frying. Bake, grill, or broil food instead.  Avoid or limit: ? Chocolate. ? Peppermint or spearmint. ? Alcohol. ? Pepper. ? Black and decaffeinated coffee. ? Black and decaffeinated tea. ? Bubbly (carbonated) soft drinks. ? Caffeinated energy drinks and soft drinks.  Limit high-fat foods such as: ? Fatty meat or fried foods. ? Whole milk, cream, butter, or ice cream. ? Nuts and nut butters. ? Pastries, donuts, and sweets made with butter or shortening.  Avoid foods that cause symptoms. These foods may be different for everyone. Common foods that cause symptoms include: ? Tomatoes. ? Oranges, lemons, and limes. ? Peppers. ? Spicy food. ? Onions and garlic. ? Vinegar. Lifestyle  Maintain a healthy weight. Ask your doctor what weight is healthy for you. If you need to lose weight, work with  your doctor to do so safely.  Exercise for at least 30 minutes for 5 or more days each week, or as told by your doctor.  Wear loose-fitting clothes.  Do not smoke. If you need help quitting, ask your doctor.  Sleep with the head of your bed higher than your feet. Use a wedge under the mattress or blocks under the bed frame to raise the head of the bed. Summary  When you have gastroesophageal reflux disease (GERD), food and lifestyle choices are very important in easing your symptoms.  Eat small meals often instead of 3 large meals a day. Eat your meals slowly, and in a place where you are relaxed.  Limit high-fat foods such as fatty meat or fried foods.  Avoid bending over or lying down until 2-3 hours after eating.  Avoid peppermint and spearmint, caffeine, alcohol, and chocolate. This information is not intended to replace advice given to you by your health care provider. Make sure you discuss any questions you have with your health care provider. Document Revised: 06/08/2018 Document Reviewed: 03/23/2016 Elsevier Patient Education  [place].

## 2019-08-14 ENCOUNTER — Telehealth: Payer: Self-pay | Admitting: Family Medicine

## 2019-08-14 NOTE — Telephone Encounter (Signed)
Opened in error

## 2019-08-14 NOTE — Telephone Encounter (Signed)
Left message for patient to call back and schedule Medicare Annual Wellness Visit (AWV) either virtually or in office.  Please schedule at anytime with [facility] Health Advisor.

## 2019-09-26 ENCOUNTER — Telehealth: Payer: Self-pay | Admitting: Family Medicine

## 2019-09-26 NOTE — Telephone Encounter (Signed)
Copied from [place] [id number]. Topic: Medicare AWV >> Sep 26, 2019  3:37 PM Cher Nakai R wrote: Reason for CRM:   Left message for patient to call back and schedule Medicare Annual Wellness Visit (AWV) to be done virtually.  No hx of AWV  Please schedule at anytime with [place].      40 Minutes appointment

## 2019-10-30 ENCOUNTER — Telehealth: Payer: Self-pay | Admitting: Family Medicine

## 2019-10-30 NOTE — Telephone Encounter (Signed)
Copied from [place] [id number]. Topic: Medicare AWV >> Oct 30, 2019  3:38 PM Weston Anna wrote: Left message for patient to call back and schedule the Medicare Annual Wellness Visit (AWV) in office or virtual  AWVS eligible as of 04/30/2019  Please schedule at anytime with [place].  40 minute appointment  Any questions, please contact me at [phone]

## 2019-12-14 ENCOUNTER — Encounter: Payer: Medicare Other | Admitting: Family Medicine

## 2019-12-18 ENCOUNTER — Telehealth: Payer: Self-pay | Admitting: Family Medicine

## 2019-12-18 NOTE — Telephone Encounter (Signed)
Copied from [employer] [id number]. Topic: Medicare AWV >> Dec 18, 2019  2:31 PM Cher Nakai R wrote: Reason for CRM:  Left message for patient to call back and schedule the Medicare Annual Wellness Visit (AWV) in office or virtual  AWVS eligible as of 04/30/19   Please schedule at anytime with [place].  40 minute appointment  Any questions, please contact me at [phone]

## 2020-01-16 ENCOUNTER — Ambulatory Visit: Payer: 59 | Admitting: Family Medicine

## 2020-01-17 ENCOUNTER — Ambulatory Visit
Admission: RE | Admit: 2020-01-17 | Discharge: 2020-01-17 | Disposition: A | Discharge status: Home / Self Care | Payer: 59 | Attending: Family Medicine | Admitting: Family Medicine

## 2020-01-17 ENCOUNTER — Ambulatory Visit (INDEPENDENT_AMBULATORY_CARE_PROVIDER_SITE_OTHER): Payer: 59 | Admitting: Family Medicine

## 2020-01-17 ENCOUNTER — Ambulatory Visit
Admission: RE | Admit: 2020-01-17 | Discharge: 2020-01-17 | Disposition: A | Discharge status: Home / Self Care | Payer: 59 | Source: Ambulatory Visit | Attending: Family Medicine | Admitting: Family Medicine

## 2020-01-17 ENCOUNTER — Other Ambulatory Visit: Payer: Self-pay

## 2020-01-17 ENCOUNTER — Encounter: Payer: Self-pay | Admitting: Family Medicine

## 2020-01-17 VITALS — BP 124/68 | HR 86 | Temp 98.2°F | Resp 16 | Ht 68.0 in | Wt 147.5 lb

## 2020-01-17 DIAGNOSIS — K5900 Constipation, unspecified: Secondary | ICD-10-CM

## 2020-01-17 DIAGNOSIS — S069X9S Unspecified intracranial injury with loss of consciousness of unspecified duration, sequela: Secondary | ICD-10-CM

## 2020-01-17 DIAGNOSIS — J449 Chronic obstructive pulmonary disease, unspecified: Secondary | ICD-10-CM

## 2020-01-17 DIAGNOSIS — F0630 Mood disorder due to known physiological condition, unspecified: Secondary | ICD-10-CM

## 2020-01-17 DIAGNOSIS — R1012 Left upper quadrant pain: Secondary | ICD-10-CM | POA: Diagnosis not present

## 2020-01-17 DIAGNOSIS — S069XAS Unspecified intracranial injury with loss of consciousness status unknown, sequela: Secondary | ICD-10-CM

## 2020-01-17 DIAGNOSIS — Z1159 Encounter for screening for other viral diseases: Secondary | ICD-10-CM

## 2020-01-17 DIAGNOSIS — R1010 Upper abdominal pain, unspecified: Secondary | ICD-10-CM

## 2020-01-17 DIAGNOSIS — K219 Gastro-esophageal reflux disease without esophagitis: Secondary | ICD-10-CM | POA: Diagnosis not present

## 2020-01-17 DIAGNOSIS — I10 Essential (primary) hypertension: Secondary | ICD-10-CM | POA: Diagnosis not present

## 2020-01-17 DIAGNOSIS — R5383 Other fatigue: Secondary | ICD-10-CM

## 2020-01-17 DIAGNOSIS — R519 Headache, unspecified: Secondary | ICD-10-CM

## 2020-01-17 DIAGNOSIS — Z114 Encounter for screening for human immunodeficiency virus [HIV]: Secondary | ICD-10-CM

## 2020-01-17 DIAGNOSIS — F1190 Opioid use, unspecified, uncomplicated: Secondary | ICD-10-CM

## 2020-01-17 MED ORDER — METOCLOPRAMIDE HCL 5 MG PO TABS: 5.0000 mg | ORAL_TABLET | Freq: Three times a day (TID) | ORAL | 2 refills | Status: DC | PRN

## 2020-01-17 NOTE — Progress Notes (Addendum)
Patient ID: [name], female    DOB: Aug 29, 1965, 54 y.o.   MRN: [id number]  PCP: [name], PA-C  Chief Complaint  Patient presents with  . Annual Exam    Subjective:   [name] is a 54 y.o. female, presents to clinic with CC of the following:  HPI  GI issues for most of the year Early fullness,  Bloating, reflux, excessive belching, and frequent abd pain and vomiting Sx worse with eating or right after eating Did presents for this in May tried carafate and protonix 40 mg BID but not improvement  On narcotics and miralax prn, bm about every other day, no pushing or straining, stools formed mostly soft Hx of appendectomy No noticed food triggers, but amount of food seems to be related to abd pain, pain is constant but worse with eating and   emgality - [facility] subspecialist  HA less severe but still daily Still on narcotics soma and fioricet  She was scheduled today for a physical but does not want to do Pap or any other her immunizations or screenings today and her main concern is her abdominal pain   Patient Active Problem List   Diagnosis Date Noted  . Chronic obstructive pulmonary disease ([place]) 06/14/2019  . Tobacco use 06/14/2019  . 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 S/P laparoscopic appendectomy 02/15/2019  . Nonintractable headache   . Hypertension   . Chronic neck pain   . Vertigo 07/11/2017  . Abdominal pain 11/15/2016  . FH: colon polyps 11/15/2016  . Elevated alkaline phosphatase level 11/15/2016  . BRBPR (bright red blood per rectum) 11/15/2016  . Other dysphagia 11/15/2016  . Other constipation 11/15/2016  . PTSD (post-traumatic stress disorder) 05/18/2016  . Breast mass, left 01/08/2016  . Anxiety and depression 01/08/2016  . Chronic pain 01/08/2016  . Seizure disorder ([place]) 01/08/2016  . GERD  (gastroesophageal reflux disease) 01/08/2016  . Difficulty sleeping 08/05/2015  . Mood disorder ([place]) 05/05/2015  . Tobacco abuse counseling 04/15/2015  . Crushing injury of neck 09/27/2011  . Head injury 05/06/2011      Current Outpatient Medications:  .  albuterol (VENTOLIN HFA) 108 (90 Base) MCG/ACT inhaler, INL 2 PFS PO Q 4 TO 6 H IF NEEDED, Disp: 18 g, Rfl: 3 .  butalbital-acetaminophen-caffeine (FIORICET) 50-325-40 MG tablet, Take 1 tablet by mouth every 6 (six) hours as needed for headache., Disp: 30 tablet, Rfl: 0 .  carisoprodol (SOMA) 350 MG tablet, Take 700 mg by mouth 2 (two) times daily. , Disp: , Rfl:  .  Docusate Sodium (DSS) 100 MG CAPS, TK ONE C PO BID PRF MILD CONSTIPATION, Disp: , Rfl:  .  fexofenadine (ALLEGRA) 180 MG tablet, Take 180 mg by mouth daily. , Disp: , Rfl:  .  lisinopril-hydrochlorothiazide (ZESTORETIC) 10-12.5 MG tablet, Take 1 tablet by mouth daily., Disp: 90 tablet, Rfl: 3 .  oxyCODONE (OXY IR/ROXICODONE) 5 MG immediate release tablet, , Disp: , Rfl:  .  pantoprazole (PROTONIX) 40 MG tablet, Take 1 tablet (40 mg total) by mouth 2 (two) times daily., Disp: 180 tablet, Rfl: 3 .  Tiotropium Bromide Monohydrate (SPIRIVA RESPIMAT) 2.5 MCG/ACT AERS, Inhale 2 puffs into the lungs daily., Disp: 4 g, Rfl: 5 .  ARIPiprazole (ABILIFY) 5 MG tablet, Take 5 mg by mouth in the morning and at bedtime. (Patient not taking:  Reported on 01/17/2020), Disp: , Rfl:    Allergies  Allergen Reactions  . Contrast Media [Iodinated Diagnostic Agents]   . Diphenhydramine Hcl Hives and Itching  . Adhesive [Tape]     PAPER TAPE OK  . Iodine Other (See Comments)  . Prednisone Anxiety  . Sulfa Antibiotics Rash     Social History   Tobacco Use  . Smoking status: Current Every Day Smoker    Packs/day: 1.00    Types: Cigarettes  . Smokeless tobacco: Never Used  Vaping Use  . Vaping Use: Every day  . Substances: CBD  Substance Use Topics  . Alcohol use: No  . Drug use:  Yes    Types: Marijuana      Chart Review Today: I personally reviewed active problem list, medication list, allergies, family history, social history, health maintenance, notes from last encounter, lab results, imaging with the patient/caregiver today.   Review of Systems 10 Systems reviewed and are negative for acute change except as noted in the HPI.     Objective:   Vitals:   01/17/20 0959  BP: 124/68  Pulse: 86  Resp: 16  Temp: 98.2 F (36.8 C)  TempSrc: Oral  SpO2: 93%  Weight: 147 lb 8 oz (66.9 kg)  Height: 5\' 8"  (1.727 m)    Body mass index is 22.43 kg/m.  Physical Exam Vitals and nursing note reviewed.  Constitutional:      General: She is not in acute distress.    Appearance: Normal appearance. She is well-developed. She is not ill-appearing, toxic-appearing or diaphoretic.     Interventions: Face mask in place.  HENT:     Head: Normocephalic and atraumatic.     Right Ear: External ear normal.     Left Ear: External ear normal.  Eyes:     General: Lids are normal. No scleral icterus.       Right eye: No discharge.        Left eye: No discharge.     Conjunctiva/sclera: Conjunctivae normal.  Neck:     Trachea: Phonation normal. No tracheal deviation.  Cardiovascular:     Rate and Rhythm: Normal rate and regular rhythm.     Pulses: Normal pulses.          Radial pulses are 2+ on the right side and 2+ on the left side.       Posterior tibial pulses are 2+ on the right side and 2+ on the left side.     Heart sounds: Normal heart sounds. No murmur heard.  No friction rub. No gallop.   Pulmonary:     Effort: Pulmonary effort is normal. No respiratory distress.     Breath sounds: Normal breath sounds. No stridor. No wheezing, rhonchi or rales.  Chest:     Chest wall: No tenderness.  Abdominal:     General: Abdomen is flat. Bowel sounds are normal. There is no distension or abdominal bruit.     Palpations: Abdomen is soft. There is no hepatomegaly,  splenomegaly, mass or pulsatile mass.     Tenderness: There is no abdominal tenderness. There is no right CVA tenderness, left CVA tenderness, guarding or rebound. Negative signs include Murphy's sign, Rovsing's sign and McBurney's sign.     Hernia: No hernia is present.  Musculoskeletal:     Right lower leg: No edema.     Left lower leg: No edema.  Skin:    General: Skin is warm and dry.     Coloration: Skin  is not jaundiced or pale.     Findings: No rash.  Neurological:     Mental Status: She is alert. Mental status is at baseline.     Motor: No abnormal muscle tone.     Gait: Gait normal.  Psychiatric:        Speech: Speech normal.      Results for orders placed or performed in visit on 06/14/19  COMPLETE METABOLIC PANEL WITH GFR  Result Value Ref Range   Glucose, Bld 85 65 - 99 mg/dL   BUN 15 7 - 25 mg/dL   Creat 0.74 0.50 - 1.05 mg/dL   GFR, Est Non African American 93 > OR = 60 mL/min/1.60m2   GFR, Est African American 108 > OR = 60 mL/min/1.69m2   BUN/Creatinine Ratio NOT APPLICABLE 6 - 22 (calc)   Sodium 140 135 - 146 mmol/L   Potassium 4.4 3.5 - 5.3 mmol/L   Chloride 97 (L) 98 - 110 mmol/L   CO2 30 20 - 32 mmol/L   Calcium 10.0 8.6 - 10.4 mg/dL   Total Protein 7.1 6.1 - 8.1 g/dL   Albumin 4.5 3.6 - 5.1 g/dL   Globulin 2.6 1.9 - 3.7 g/dL (calc)   AG Ratio 1.7 1.0 - 2.5 (calc)   Total Bilirubin 0.3 0.2 - 1.2 mg/dL   Alkaline phosphatase (APISO) 88 37 - 153 U/L   AST 15 10 - 35 U/L   ALT 9 6 - 29 U/L       Assessment & Plan:     ICD-10-CM   1. Pain of upper abdomen  R10.10 CBC with Differential/Platelet    COMPLETE METABOLIC PANEL WITH GFR    TSH    Lipase    DG Abd 1 View    Urinalysis, Routine w reflex microscopic    metoCLOPramide (REGLAN) 5 MG tablet   No focal tenderness on exam check labs, may be GERD gastritis, pancreatitis, delayed gastric emptying or related to constipation?  Suspect she will need GI  2. Hypertension, unspecified type  I10  COMPLETE METABOLIC PANEL WITH GFR   Blood pressure well controlled and at goal today  3. Gastroesophageal reflux disease, unspecified whether esophagitis present  K21.9    Abdominal pain without any improvement with taking Protonix twice daily for more than 6 months  4. 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 Toy Care moved clinics - off all meds including Depakote and Abilify  5. Nonintractable headache, unspecified chronicity pattern, unspecified headache type  R51.9    Managed by subspecialist at [facility] has improved somewhat with Emgality  6. Chronic obstructive pulmonary disease, unspecified COPD type ([place])  J44.9    Patient states she uses the inhalers intermittently not daily no recent wheeze, cough or exertional shortness of breath  7. Opioid use, unspecified, uncomplicated  C94.70    May be causing constipation or some of her abdominal pain symptoms  8. Encounter for hepatitis C screening test for low risk patient  Z11.59 Hepatitis C antibody  9. Screening for HIV without presence of risk factors  Z11.4 HIV Antibody (routine testing w rflx)  10. Other fatigue  R53.83 CBC with Differential/Platelet    TSH   Generalized fatigue multiple chronic conditions and recently off several of her medications rule out hypothyroid and anemia  11. Constipation, unspecified constipation type  K59.00 TSH    DG Abd  1 View   Pain is located from left upper quadrant down to left lower quadrant sometimes epigastrium managed with MiraLAX nontraumatic stools every other day   For abdominal pain encourage patient to continue Protonix, can try Reglan for nausea and to promote GI motility Most of her symptoms, with big meals encourage small meals and pushing a lot of fluids No concerning tenderness on exam today do not feel she needs a CT scan but we will do a screening x-ray and a suspect she will need other work-up  from a GI specialist Once labs and x-ray result will put in the referral since she has already tried PPI for more than 6 months without any improvement  GI referral contact info - [phone] [place], [place] 01/17/20 10:19 AM

## 2020-01-18 LAB — HIV ANTIBODY (ROUTINE TESTING W REFLEX): HIV 1&2 Ab, 4th Generation: NONREACTIVE

## 2020-01-18 LAB — COMPLETE METABOLIC PANEL WITH GFR
AG Ratio: 1.8 (calc) (ref 1.0–2.5)
ALT: 11 U/L (ref 6–29)
AST: 15 U/L (ref 10–35)
Albumin: 4.5 g/dL (ref 3.6–5.1)
Alkaline phosphatase (APISO): 161 U/L — ABNORMAL HIGH (ref 37–153)
BUN: 16 mg/dL (ref 7–25)
CO2: 30 mmol/L (ref 20–32)
Calcium: 10.6 mg/dL — ABNORMAL HIGH (ref 8.6–10.4)
Chloride: 98 mmol/L (ref 98–110)
Creat: 0.8 mg/dL (ref 0.50–1.05)
GFR, Est African American: 98 mL/min/{1.73_m2} (ref >=60)
GFR, Est Non African American: 85 mL/min/{1.73_m2} (ref >=60)
Globulin: 2.5 g/dL (calc) (ref 1.9–3.7)
Glucose, Bld: 95 mg/dL (ref 65–99)
Potassium: 4.9 mmol/L (ref 3.5–5.3)
Sodium: 139 mmol/L (ref 135–146)
Total Bilirubin: 0.3 mg/dL (ref 0.2–1.2)
Total Protein: 7 g/dL (ref 6.1–8.1)

## 2020-01-18 LAB — CBC WITH DIFFERENTIAL/PLATELET
Absolute Monocytes: 644 cells/uL (ref 200–950)
Basophils Absolute: 30 cells/uL (ref 0–200)
Basophils Relative: 0.3 %
Eosinophils Absolute: 30 cells/uL (ref 15–500)
Eosinophils Relative: 0.3 %
HCT: 46.2 % — ABNORMAL HIGH (ref 35.0–45.0)
Hemoglobin: 15.8 g/dL — ABNORMAL HIGH (ref 11.7–15.5)
Lymphs Abs: 3000 cells/uL (ref 850–3900)
MCH: 29.6 pg (ref 27.0–33.0)
MCHC: 34.2 g/dL (ref 32.0–36.0)
MCV: 86.7 fL (ref 80.0–100.0)
MPV: 10 fL (ref 7.5–12.5)
Monocytes Relative: 6.5 %
Neutro Abs: 6197 cells/uL (ref 1500–7800)
Neutrophils Relative %: 62.6 %
Platelets: 392 10*3/uL (ref 140–400)
RBC: 5.33 10*6/uL — ABNORMAL HIGH (ref 3.80–5.10)
RDW: 13.2 % (ref 11.0–15.0)
Total Lymphocyte: 30.3 %
WBC: 9.9 10*3/uL (ref 3.8–10.8)

## 2020-01-18 LAB — URINALYSIS, ROUTINE W REFLEX MICROSCOPIC
Bilirubin Urine: NEGATIVE
Glucose, UA: NEGATIVE
Hgb urine dipstick: NEGATIVE
Ketones, ur: NEGATIVE
Leukocytes,Ua: NEGATIVE
Nitrite: NEGATIVE
Protein, ur: NEGATIVE
Specific Gravity, Urine: 1.022 (ref 1.001–1.03)
pH: 6 (ref 5.0–8.0)

## 2020-01-18 LAB — HEPATITIS C ANTIBODY
Hepatitis C Ab: NONREACTIVE
SIGNAL TO CUT-OFF: 0.01 (ref <1.00)

## 2020-01-18 LAB — TSH: TSH: 1.71 mIU/L

## 2020-01-18 LAB — LIPASE: Lipase: 20 U/L (ref 7–60)

## 2020-01-23 NOTE — Addendum Note (Signed)
Addended by: [name] on: 01/23/2020 12:54 PM   Modules accepted: Orders

## 2020-02-12 ENCOUNTER — Telehealth: Payer: Self-pay | Admitting: Family Medicine

## 2020-02-12 NOTE — Telephone Encounter (Signed)
Copied from [place] [id number]. Topic: Medicare AWV >> Feb 12, 2020  2:23 PM Cher Nakai R wrote: Reason for CRM:  Left message for patient to call back and schedule Medicare Annual Wellness Visit (AWV) in office.   If not able to come in office, please offer to do virtually.   Last AWV  04/30/2018  Please schedule at anytime with [place].  40 minute appointment  Any questions, please contact me at [phone]

## 2020-03-06 ENCOUNTER — Encounter: Payer: 59 | Admitting: Family Medicine

## 2020-03-19 ENCOUNTER — Encounter: Payer: 59 | Admitting: Family Medicine

## 2020-03-20 ENCOUNTER — Other Ambulatory Visit: Payer: Self-pay

## 2020-03-20 ENCOUNTER — Ambulatory Visit (INDEPENDENT_AMBULATORY_CARE_PROVIDER_SITE_OTHER): Payer: 59 | Admitting: Gastroenterology

## 2020-03-20 ENCOUNTER — Encounter: Payer: Self-pay | Admitting: Gastroenterology

## 2020-03-20 VITALS — BP 113/81 | HR 111 | Temp 98.5°F | Ht 68.0 in | Wt 147.0 lb

## 2020-03-20 DIAGNOSIS — R6881 Early satiety: Secondary | ICD-10-CM

## 2020-03-20 DIAGNOSIS — Z1211 Encounter for screening for malignant neoplasm of colon: Secondary | ICD-10-CM

## 2020-03-20 DIAGNOSIS — R109 Unspecified abdominal pain: Secondary | ICD-10-CM | POA: Diagnosis not present

## 2020-03-20 DIAGNOSIS — R748 Abnormal levels of other serum enzymes: Secondary | ICD-10-CM | POA: Diagnosis not present

## 2020-03-20 DIAGNOSIS — K3184 Gastroparesis: Secondary | ICD-10-CM

## 2020-03-20 DIAGNOSIS — K311 Adult hypertrophic pyloric stenosis: Secondary | ICD-10-CM

## 2020-03-20 MED ORDER — NA SULFATE-K SULFATE-MG SULF 17.5-3.13-1.6 GM/177ML PO SOLN: 1.0000 | Freq: Once | ORAL | 0 refills | Status: AC

## 2020-03-20 NOTE — Patient Instructions (Signed)
Gastroparesis  Gastroparesis is a condition in which food takes longer than normal to empty from the stomach. This condition is also known as delayed gastric emptying. It is usually a [place]-term (chronic) condition. There is no cure, but there are treatments and things that you can do at home to help relieve symptoms. Treating the underlying condition that causes gastroparesis can also help relieve symptoms. What are the causes? In many cases, the cause of this condition is not known. Possible causes include:  A hormone (endocrine) disorder, such as hypothyroidism or diabetes.  A nervous system disease, such as Parkinson's disease or multiple sclerosis.  Cancer, infection, or surgery that affects the stomach or vagus nerve. The vagus nerve runs from your chest, through your neck, and to the lower part of your brain.  A connective tissue disorder, such as scleroderma.  Certain medicines. What increases the risk? You are more likely to develop this condition if:  You have certain disorders or diseases. These may include: ? An endocrine disorder. ? An eating disorder. ? Amyloidosis. ? Scleroderma. ? Parkinson's disease. ? Multiple sclerosis. ? Cancer or infection of the stomach or the vagus nerve.  You have had surgery on your stomach or vagus nerve.  You take certain medicines.  You are female. What are the signs or symptoms? Symptoms of this condition include:  Feeling full after eating very little or a loss of appetite.  Nausea, vomiting, or heartburn.  Bloating of your abdomen.  Inconsistent blood sugar (glucose) levels on blood tests.  Unexplained weight loss.  Acid from the stomach coming up into the esophagus (gastroesophageal reflux).  Sudden tightening (spasm) of the stomach, which can be painful. Symptoms may come and go. Some people may not notice any symptoms. How is this diagnosed? This condition is diagnosed with tests, such as:  Tests that check how  [place] it takes food to move through the stomach and intestines. These tests include: ? Upper gastrointestinal (GI) series. For this test, you drink a liquid that shows up well on X-rays, and then X-rays are taken of your intestines. ? Gastric emptying scintigraphy. For this test, you eat food that contains a small amount of radioactive material, and then scans are taken. ? Wireless capsule GI monitoring system. For this test, you swallow a pill (capsule) that records information about how foods and fluid move through your stomach.  Gastric manometry. For this test, a tube is passed down your throat and into your stomach to measure electrical and muscular activity.  Endoscopy. For this test, a [place], thin tube with a camera and light on the end is passed down your throat and into your stomach to check for problems in your stomach lining.  Ultrasound. This test uses sound waves to create images of the inside of your body. This can help rule out gallbladder disease or pancreatitis as a cause of your symptoms. How is this treated? There is no cure for this condition, but treatment and home care may relieve symptoms. Treatment may include:  Treating the underlying cause.  Managing your symptoms by making changes to your diet and exercise habits.  Taking medicines to control nausea and vomiting and to stimulate stomach muscles.  Getting food through a feeding tube in the hospital. This may be done in severe cases.  Having surgery to insert a device called a gastric electrical stimulator into your body. This device helps improve stomach emptying and control nausea and vomiting. Follow these instructions at home:  Take over-the-counter and   prescription medicines only as told by your health care provider.  Follow instructions from your health care provider about eating or drinking restrictions. Your health care provider may recommend that you: ? Eat smaller meals more often. ? Eat low-fat  foods. ? Eat low-fiber forms of high-fiber foods. For example, eat cooked vegetables instead of raw vegetables. ? Have only liquid foods instead of solid foods. Liquid foods are easier to digest.  Drink enough fluid to keep your urine pale yellow.  Exercise as often as told by your health care provider.  Keep all follow-up visits. This is important. Contact a health care provider if you:  Notice that your symptoms do not improve with treatment.  Have new symptoms. Get help right away if you:  Have severe pain in your abdomen that does not improve with treatment.  Have nausea that is severe or does not go away.  Vomit every time you drink fluids. Summary  Gastroparesis is a [place]-term (chronic) condition in which food takes longer than normal to empty from the stomach.  Symptoms include nausea, vomiting, heartburn, bloating of your abdomen, and loss of appetite.  Eating smaller portions, low-fat foods, and low-fiber forms of high-fiber foods may help you manage your symptoms.  Get help right away if you have severe pain in your abdomen. This information is not intended to replace advice given to you by your health care provider. Make sure you discuss any questions you have with your health care provider. Document Revised: 06/25/2019 Document Reviewed: 06/25/2019 Elsevier Patient Education  2021 Elsevier Inc.  

## 2020-03-20 NOTE — Progress Notes (Signed)
[name] MD, MRCP(U.K) [address]  [place]  [place], [place] [address]  Main: [phone]  Fax: [phone]   Gastroenterology Consultation  Referring Provider:     [name], PA-C Primary Care Physician:  [name], PA-C Primary Gastroenterologist:  Dr. [name]  Reason for Consultation:     Abdominal pain and constipation        HPI:   [name] is a 55 y.o. y/o female referred for consultation & management  by  [name], PA-C.     Here to see me for abdominal pain, bloating, constipation.  X-ray of the abdomen in November 2021 showed mild to moderate increasing colonic stool burden.  CT scan of the abdomen in December 2021 demonstrated resolution of previously seen small bowel obstruction, moderate constipation.  November 2021: TSH normal, hemoglobin 15.8, CMP normal except an elevated calcium and alkaline phosphatase.  She says that for over a year and a half she has had abdominal discomfort in the epigastric area describes a sense of bloating and a sense of early satiety and food sits in her stomach for [place].'s of time.  She often feels that her breakfast is still sitting in her stomach at lunchtime.  She takes oxycodone twice a day for back pain.  She has a history of traumatic brain injury.  She says she is due for colon cancer screening with a colonoscopy.  She was scheduled to have an a few years back but walked out of the endoscopy unit as she had to wait too [place].  Denies any NSAID use.  The discomfort is localized.  No relieving factors nor does she have any aggravating factors.  Often she cannot tolerate much food.  Denies any weight loss.  Past Medical History:  Diagnosis Date  . BRBPR (bright red blood per rectum)   . Breast mass, left   . Cancer (HCC)    CERVICAL  . Depression   . Falls    EASILY  . GERD (gastroesophageal reflux disease)   . PTSD (post-traumatic stress disorder)   . PTSD (post-traumatic stress disorder)   . Seizures  ([place])   . 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 TBI (traumatic brain injury) ([place])    SKULL FX 2009  . Vertigo   . Vertigo     Past Surgical History:  Procedure Laterality Date  . abd pain    . APPENDECTOMY    . BACK SURGERY     LUMBAR FUSIONS X 2  . CATARACT EXTRACTION W/PHACO Right 01/07/2015   Procedure: CATARACT EXTRACTION PHACO AND INTRAOCULAR LENS PLACEMENT (IOC);  Surgeon: Birder Robson, MD;  Location: [facility] ORS;  Service: Ophthalmology;  Laterality: Right;  [place] 0 AP% 0 CDE 0 fluid pack lot #[id number]  . CATARACT EXTRACTION W/PHACO Left 01/28/2015   Procedure: CATARACT EXTRACTION PHACO AND INTRAOCULAR LENS PLACEMENT (IOC);  Surgeon: Birder Robson, MD;  Location: [facility] ORS;  Service: Ophthalmology;  Laterality: Left;  [place] 00:18 AP%7.1 CDE1.32 fluid pack lot # [id number]  . Cerebral Catheterization for Brain Bleed  02/02/2019   [facility]  . LEEP    . XI ROBOT ABDOMINAL PERINEAL RESECTION N/A 01/24/2019   Procedure: Robot assisted appendectomy ;  Surgeon: Ronny Bacon, MD;  Location: [facility] ORS;  Service: General;  Laterality: N/A;    Prior to Admission medications   Medication Sig Start Date End Date Taking? Authorizing Provider  albuterol (VENTOLIN HFA) 108 (90 Base) MCG/ACT inhaler  INL 2 PFS PO Q 4 TO 6 H IF NEEDED 06/14/19   Danelle Berry, PA-C  ARIPiprazole (ABILIFY) 5 MG tablet Take 5 mg by mouth in the morning and at bedtime. Patient not taking: Reported on 01/17/2020 06/09/19   [provider]  butalbital-acetaminophen-caffeine (FIORICET) 50-325-40 MG tablet Take 1 tablet by mouth every 6 (six) hours as needed for headache. 03/26/19   Danelle Berry, PA-C  carisoprodol (SOMA) 350 MG tablet Take 700 mg by mouth 2 (two) times daily.     [provider]  Docusate Sodium (DSS) 100 MG CAPS TK ONE C PO BID PRF MILD CONSTIPATION 01/28/19   [provider]  fexofenadine (ALLEGRA) 180 MG tablet Take 180 mg by mouth daily.      [provider]  lisinopril-hydrochlorothiazide (ZESTORETIC) 10-12.5 MG tablet Take 1 tablet by mouth daily. 06/14/19   Danelle Berry, PA-C  metoCLOPramide (REGLAN) 5 MG tablet Take 1-2 tablets (5-10 mg total) by mouth every 8 (eight) hours as needed for nausea or vomiting. 01/17/20   Danelle Berry, PA-C  oxyCODONE (OXY IR/ROXICODONE) 5 MG immediate release tablet  02/19/19   [provider]  pantoprazole (PROTONIX) 40 MG tablet Take 1 tablet (40 mg total) by mouth 2 (two) times daily. 07/16/19   Danelle Berry, PA-C  Tiotropium Bromide Monohydrate (SPIRIVA RESPIMAT) 2.5 MCG/ACT AERS Inhale 2 puffs into the lungs daily. 06/14/19   Danelle Berry, PA-C    Family History  Problem Relation Age of Onset  . Ovarian cancer Maternal Aunt        40's  . Depression Mother   . Heart disease Father   . Hypertension Father   . Diabetes Father   . Post-traumatic stress disorder Sister   . Hypertension Sister   . Diabetes Brother   . Depression Brother   . Hypertension Brother      Social History   Tobacco Use  . Smoking status: Current Every Day Smoker    Packs/day: 1.00    Types: Cigarettes  . Smokeless tobacco: Never Used  Vaping Use  . Vaping Use: Every day  . Substances: CBD  Substance Use Topics  . Alcohol use: No  . Drug use: Yes    Types: Marijuana    Allergies as of 03/20/2020 - Review Complete 03/20/2020  Allergen Reaction Noted  . Contrast media [iodinated diagnostic agents]  01/08/2016  . Diphenhydramine hcl Hives and Itching 04/15/2015  . Adhesive [tape]  01/01/2015  . Iodine Other (See Comments) 05/05/2016  . Prednisone Anxiety 07/27/2017  . Sulfa antibiotics Rash 07/04/2012    Review of Systems:    All systems reviewed and negative except where noted in HPI.   Physical Exam:  BP 113/81 (BP Location: Right Arm, Patient Position: Sitting, Cuff Size: Normal)   Pulse (!) 111   Temp 98.5 F (36.9 C) (Oral)   Ht 5\' 8"  (1.727 m)   Wt 147 lb (66.7 kg)    BMI 22.35 kg/m  No LMP recorded. Patient is postmenopausal. Psych:  Alert and cooperative. Normal mood and affect. General:   Alert,  Well-developed, well-nourished, pleasant and cooperative in NAD Head:  Normocephalic and atraumatic. Eyes:  Sclera clear, no icterus.   Conjunctiva pink. Lungs:  Respirations even and unlabored.  Clear throughout to auscultation.   No wheezes, crackles, or rhonchi. No acute distress. Heart:  Regular rate and rhythm; no murmurs, clicks, rubs, or gallops. Abdomen:  Normal bowel sounds.  No bruits.  Soft, non-tender and non-distended without masses, hepatosplenomegaly  or hernias noted.  No guarding or rebound tenderness.    Neurologic:  Alert and oriented x3;  grossly normal neurologically. Psych:  Alert and cooperative. Normal mood and affect.  Imaging Studies: No results found.  Assessment and Plan:   [name] is a 55 y.o. y/o female has been referred for abdominal pain, nausea vomiting, constipation.  Labs in November 2021 demonstrated an isolated elevation in alkaline phosphatase and total calcium.  Her GI symptoms are suggestive of gastroparesis likely secondary to use of oxycodone.  She is also due for colon cancer screening.  Plan 1.  Check PTH, calcium, vitamin D, GGT 2.  If alkaline phosphatase is still elevated will get right upper quadrant ultrasound 3.  Epigastric pain: Check H. pylori breath test avoid any NSAIDs 4.  Gastric emptying study to rule out gastroparesis 5.  Gastroparesis diet 6.  Colonoscopy at the same time as endoscopy for colon cancer screening and endoscopy to rule out gastric outlet obstruction. 7.  If she does have gastroparesis if probably due to narcotic usage for back pain which may need to be tapered off and alternative options for pain management be sought after  I have discussed alternative options, risks & benefits,  which include, but are not limited to, bleeding, infection, perforation,respiratory complication & drug  reaction.  The patient agrees with this plan & written consent will be obtained.       Follow up in 6 to 8 weeks office visit  Dr [name] MD,MRCP(U.K)

## 2020-03-24 LAB — ALKALINE PHOSPHATASE, ISOENZYMES
BONE FRACTION: 23 % (ref 14–68)
INTESTINAL FRAC.: 2 % (ref 0–18)
LIVER FRACTION: 75 % (ref 18–85)

## 2020-03-24 LAB — MITOCHONDRIAL/SMOOTH MUSCLE AB PNL
Mitochondrial Ab: <20 Units (ref 0.0–20.0)
Smooth Muscle Ab: 19 Units (ref 0–19)

## 2020-03-24 LAB — COMPREHENSIVE METABOLIC PANEL
ALT: 15 IU/L (ref 0–32)
AST: 14 IU/L (ref 0–40)
Albumin/Globulin Ratio: 1.8 (ref 1.2–2.2)
Albumin: 4.4 g/dL (ref 3.8–4.9)
Alkaline Phosphatase: 179 IU/L — ABNORMAL HIGH (ref 44–121)
BUN/Creatinine Ratio: 10 (ref 9–23)
BUN: 10 mg/dL (ref 6–24)
Bilirubin Total: <0.2 mg/dL (ref 0.0–1.2)
CO2: 26 mmol/L (ref 20–29)
Calcium: 9.7 mg/dL (ref 8.7–10.2)
Chloride: 101 mmol/L (ref 96–106)
Creatinine, Ser: 0.96 mg/dL (ref 0.57–1.00)
GFR calc Af Amer: 79 mL/min/{1.73_m2} (ref >59)
GFR calc non Af Amer: 68 mL/min/{1.73_m2} (ref >59)
Globulin, Total: 2.5 g/dL (ref 1.5–4.5)
Glucose: 90 mg/dL (ref 65–99)
Potassium: 4.5 mmol/L (ref 3.5–5.2)
Sodium: 143 mmol/L (ref 134–144)
Total Protein: 6.9 g/dL (ref 6.0–8.5)

## 2020-03-24 LAB — PTH, INTACT AND CALCIUM: PTH: 24 pg/mL (ref 15–65)

## 2020-03-24 LAB — H. PYLORI BREATH TEST: H pylori Breath Test: NEGATIVE

## 2020-03-24 LAB — ANA: Anti Nuclear Antibody (ANA): NEGATIVE

## 2020-03-24 LAB — GAMMA GT: GGT: 24 IU/L (ref 0–60)

## 2020-03-27 ENCOUNTER — Telehealth: Payer: Self-pay | Admitting: Gastroenterology

## 2020-03-27 NOTE — Telephone Encounter (Signed)
Returned patients call. LVM to call office back. 

## 2020-03-27 NOTE — Telephone Encounter (Signed)
Patient calling to reschedule Colonoscopy and also wants results from bacteria test.

## 2020-03-31 ENCOUNTER — Other Ambulatory Visit: Payer: Self-pay

## 2020-03-31 DIAGNOSIS — Z1211 Encounter for screening for malignant neoplasm of colon: Secondary | ICD-10-CM

## 2020-03-31 DIAGNOSIS — K311 Adult hypertrophic pyloric stenosis: Secondary | ICD-10-CM

## 2020-04-01 ENCOUNTER — Other Ambulatory Visit: Admission: RE | Admit: 2020-04-01 | Payer: 59 | Source: Ambulatory Visit

## 2020-04-01 ENCOUNTER — Other Ambulatory Visit: Payer: Self-pay

## 2020-04-06 ENCOUNTER — Encounter: Payer: Self-pay | Admitting: Family Medicine

## 2020-04-06 DIAGNOSIS — I10 Essential (primary) hypertension: Secondary | ICD-10-CM

## 2020-04-07 ENCOUNTER — Telehealth: Payer: Self-pay

## 2020-04-07 ENCOUNTER — Encounter: Payer: 59 | Admitting: Family Medicine

## 2020-04-07 MED ORDER — LISINOPRIL-HYDROCHLOROTHIAZIDE 10-12.5 MG PO TABS: 1.0000 | ORAL_TABLET | Freq: Every day | ORAL | 0 refills | Status: DC

## 2020-04-07 NOTE — Telephone Encounter (Signed)
Called patient to inform her of lab results. LVM to call office back. 

## 2020-04-07 NOTE — Telephone Encounter (Signed)
-----  Message from [name], MD sent at 04/06/2020 12:38 PM EST ----- Veena Sturgess Inform   Liver tests show that inflammation is not from liver with GGT being normal. Sometimes we do not know why the Alk [phos ios elevated -Suggest repeat in 6 months to ensure stability   C/c [name], PA-C   Dr [name] MD,MRCP [facility]) Gastroenterology/Hepatology Pager: [phone]

## 2020-04-09 ENCOUNTER — Telehealth: Payer: Self-pay

## 2020-04-09 NOTE — Telephone Encounter (Signed)
Patient has been notified via my chart of results and Dr. Georgeann Oppenheim recommendations.

## 2020-04-09 NOTE — Telephone Encounter (Signed)
-----  Message from [name], MD sent at 04/06/2020 12:38 PM EST ----- Kyia Rhude Inform   Liver tests show that inflammation is not from liver with GGT being normal. Sometimes we do not know why the Alk [phos ios elevated -Suggest repeat in 6 months to ensure stability   C/c [name], PA-C   Dr [name] MD,MRCP [facility]) Gastroenterology/Hepatology Pager: [phone]

## 2020-04-25 ENCOUNTER — Ambulatory Visit: Payer: 59

## 2020-04-30 ENCOUNTER — Other Ambulatory Visit: Payer: Self-pay | Admitting: Family Medicine

## 2020-04-30 DIAGNOSIS — I10 Essential (primary) hypertension: Secondary | ICD-10-CM

## 2020-05-01 ENCOUNTER — Other Ambulatory Visit: Payer: Self-pay

## 2020-05-01 ENCOUNTER — Emergency Department: Payer: 59

## 2020-05-01 ENCOUNTER — Inpatient Hospital Stay
Admission: EM | Admit: 2020-05-01 | Discharge: 2020-05-10 | DRG: 381 | Disposition: A | Discharge status: Home / Self Care | Payer: 59 | Attending: Internal Medicine | Admitting: Internal Medicine

## 2020-05-01 DIAGNOSIS — R0602 Shortness of breath: Secondary | ICD-10-CM | POA: Insufficient documentation

## 2020-05-01 DIAGNOSIS — Z961 Presence of intraocular lens: Secondary | ICD-10-CM | POA: Diagnosis present

## 2020-05-01 DIAGNOSIS — Z9841 Cataract extraction status, right eye: Secondary | ICD-10-CM | POA: Diagnosis not present

## 2020-05-01 DIAGNOSIS — Z79891 Long term (current) use of opiate analgesic: Secondary | ICD-10-CM | POA: Diagnosis not present

## 2020-05-01 DIAGNOSIS — Z9842 Cataract extraction status, left eye: Secondary | ICD-10-CM | POA: Diagnosis not present

## 2020-05-01 DIAGNOSIS — K56600 Partial intestinal obstruction, unspecified as to cause: Secondary | ICD-10-CM

## 2020-05-01 DIAGNOSIS — D72829 Elevated white blood cell count, unspecified: Secondary | ICD-10-CM | POA: Diagnosis present

## 2020-05-01 DIAGNOSIS — K2100 Gastro-esophageal reflux disease with esophagitis, without bleeding: Secondary | ICD-10-CM | POA: Diagnosis not present

## 2020-05-01 DIAGNOSIS — F0630 Mood disorder due to known physiological condition, unspecified: Secondary | ICD-10-CM

## 2020-05-01 DIAGNOSIS — F4310 Post-traumatic stress disorder, unspecified: Secondary | ICD-10-CM | POA: Diagnosis present

## 2020-05-01 DIAGNOSIS — F17210 Nicotine dependence, cigarettes, uncomplicated: Secondary | ICD-10-CM | POA: Diagnosis present

## 2020-05-01 DIAGNOSIS — K56609 Unspecified intestinal obstruction, unspecified as to partial versus complete obstruction: Secondary | ICD-10-CM | POA: Diagnosis not present

## 2020-05-01 DIAGNOSIS — J309 Allergic rhinitis, unspecified: Secondary | ICD-10-CM | POA: Diagnosis present

## 2020-05-01 DIAGNOSIS — F341 Dysthymic disorder: Secondary | ICD-10-CM

## 2020-05-01 DIAGNOSIS — Z818 Family history of other mental and behavioral disorders: Secondary | ICD-10-CM | POA: Diagnosis not present

## 2020-05-01 DIAGNOSIS — Z8041 Family history of malignant neoplasm of ovary: Secondary | ICD-10-CM | POA: Diagnosis not present

## 2020-05-01 DIAGNOSIS — Z720 Tobacco use: Secondary | ICD-10-CM | POA: Diagnosis not present

## 2020-05-01 DIAGNOSIS — Z20822 Contact with and (suspected) exposure to covid-19: Secondary | ICD-10-CM | POA: Diagnosis present

## 2020-05-01 DIAGNOSIS — Z981 Arthrodesis status: Secondary | ICD-10-CM

## 2020-05-01 DIAGNOSIS — K560 Paralytic ileus: Secondary | ICD-10-CM | POA: Diagnosis not present

## 2020-05-01 DIAGNOSIS — F17290 Nicotine dependence, other tobacco product, uncomplicated: Secondary | ICD-10-CM | POA: Diagnosis present

## 2020-05-01 DIAGNOSIS — Z888 Allergy status to other drugs, medicaments and biological substances status: Secondary | ICD-10-CM

## 2020-05-01 DIAGNOSIS — R10811 Right upper quadrant abdominal tenderness: Secondary | ICD-10-CM | POA: Diagnosis not present

## 2020-05-01 DIAGNOSIS — G894 Chronic pain syndrome: Secondary | ICD-10-CM | POA: Diagnosis present

## 2020-05-01 DIAGNOSIS — I10 Essential (primary) hypertension: Secondary | ICD-10-CM | POA: Diagnosis present

## 2020-05-01 DIAGNOSIS — R4701 Aphasia: Secondary | ICD-10-CM | POA: Diagnosis present

## 2020-05-01 DIAGNOSIS — R194 Change in bowel habit: Secondary | ICD-10-CM | POA: Diagnosis not present

## 2020-05-01 DIAGNOSIS — Z8249 Family history of ischemic heart disease and other diseases of the circulatory system: Secondary | ICD-10-CM | POA: Diagnosis not present

## 2020-05-01 DIAGNOSIS — Z79899 Other long term (current) drug therapy: Secondary | ICD-10-CM | POA: Diagnosis not present

## 2020-05-01 DIAGNOSIS — K219 Gastro-esophageal reflux disease without esophagitis: Secondary | ICD-10-CM

## 2020-05-01 DIAGNOSIS — J449 Chronic obstructive pulmonary disease, unspecified: Secondary | ICD-10-CM | POA: Diagnosis present

## 2020-05-01 DIAGNOSIS — Z8541 Personal history of malignant neoplasm of cervix uteri: Secondary | ICD-10-CM

## 2020-05-01 DIAGNOSIS — R5381 Other malaise: Secondary | ICD-10-CM | POA: Diagnosis present

## 2020-05-01 DIAGNOSIS — N179 Acute kidney failure, unspecified: Secondary | ICD-10-CM | POA: Diagnosis present

## 2020-05-01 DIAGNOSIS — R1010 Upper abdominal pain, unspecified: Secondary | ICD-10-CM | POA: Diagnosis not present

## 2020-05-01 DIAGNOSIS — Z8371 Family history of colonic polyps: Secondary | ICD-10-CM

## 2020-05-01 DIAGNOSIS — Z833 Family history of diabetes mellitus: Secondary | ICD-10-CM | POA: Diagnosis not present

## 2020-05-01 DIAGNOSIS — Z91041 Radiographic dye allergy status: Secondary | ICD-10-CM

## 2020-05-01 DIAGNOSIS — Z882 Allergy status to sulfonamides status: Secondary | ICD-10-CM

## 2020-05-01 DIAGNOSIS — K2210 Ulcer of esophagus without bleeding: Principal | ICD-10-CM | POA: Diagnosis present

## 2020-05-01 DIAGNOSIS — R109 Unspecified abdominal pain: Secondary | ICD-10-CM | POA: Diagnosis present

## 2020-05-01 DIAGNOSIS — S069XAS Unspecified intracranial injury with loss of consciousness status unknown, sequela: Secondary | ICD-10-CM

## 2020-05-01 DIAGNOSIS — R112 Nausea with vomiting, unspecified: Secondary | ICD-10-CM | POA: Diagnosis not present

## 2020-05-01 DIAGNOSIS — S069X9S Unspecified intracranial injury with loss of consciousness of unspecified duration, sequela: Secondary | ICD-10-CM

## 2020-05-01 DIAGNOSIS — Z0189 Encounter for other specified special examinations: Secondary | ICD-10-CM

## 2020-05-01 HISTORY — DX: Unspecified intestinal obstruction, unspecified as to partial versus complete obstruction: K56.609

## 2020-05-01 LAB — RESP PANEL BY RT-PCR (FLU A&B, COVID) ARPGX2
Influenza A by PCR: NEGATIVE
Influenza B by PCR: NEGATIVE
SARS Coronavirus 2 by RT PCR: NEGATIVE

## 2020-05-01 LAB — COMPREHENSIVE METABOLIC PANEL
ALT: 21 U/L (ref 0–44)
AST: 19 U/L (ref 15–41)
Albumin: 4.6 g/dL (ref 3.5–5.0)
Alkaline Phosphatase: 172 U/L — ABNORMAL HIGH (ref 38–126)
Anion gap: 14 (ref 5–15)
BUN: 19 mg/dL (ref 6–20)
CO2: 24 mmol/L (ref 22–32)
Calcium: 9.7 mg/dL (ref 8.9–10.3)
Chloride: 96 mmol/L — ABNORMAL LOW (ref 98–111)
Creatinine, Ser: 0.81 mg/dL (ref 0.44–1.00)
GFR, Estimated: >60 mL/min (ref >60)
Glucose, Bld: 119 mg/dL — ABNORMAL HIGH (ref 70–99)
Potassium: 3.8 mmol/L (ref 3.5–5.1)
Sodium: 134 mmol/L — ABNORMAL LOW (ref 135–145)
Total Bilirubin: 0.9 mg/dL (ref 0.3–1.2)
Total Protein: 8.3 g/dL — ABNORMAL HIGH (ref 6.5–8.1)

## 2020-05-01 LAB — URINALYSIS, ROUTINE W REFLEX MICROSCOPIC
Bacteria, UA: NONE SEEN
Bilirubin Urine: NEGATIVE
Glucose, UA: NEGATIVE mg/dL
Ketones, ur: NEGATIVE mg/dL
Leukocytes,Ua: NEGATIVE
Nitrite: NEGATIVE
Protein, ur: 30 mg/dL — AB
Specific Gravity, Urine: 1.027 (ref 1.005–1.030)
pH: 5 (ref 5.0–8.0)

## 2020-05-01 LAB — CBC
HCT: 48.9 % — ABNORMAL HIGH (ref 36.0–46.0)
Hemoglobin: 16.8 g/dL — ABNORMAL HIGH (ref 12.0–15.0)
MCH: 29.6 pg (ref 26.0–34.0)
MCHC: 34.4 g/dL (ref 30.0–36.0)
MCV: 86.2 fL (ref 80.0–100.0)
Platelets: 415 10*3/uL — ABNORMAL HIGH (ref 150–400)
RBC: 5.67 MIL/uL — ABNORMAL HIGH (ref 3.87–5.11)
RDW: 13.4 % (ref 11.5–15.5)
WBC: 14.6 10*3/uL — ABNORMAL HIGH (ref 4.0–10.5)
nRBC: 0 % (ref 0.0–0.2)

## 2020-05-01 LAB — LIPASE, BLOOD: Lipase: 28 U/L (ref 11–51)

## 2020-05-01 LAB — MAGNESIUM: Magnesium: 2.4 mg/dL (ref 1.7–2.4)

## 2020-05-01 MED ORDER — SODIUM CHLORIDE 0.9 % IV BOLUS
1000.0000 mL | Freq: Once | INTRAVENOUS | Status: AC
  Administered 2020-05-01: 1000 mL via INTRAVENOUS

## 2020-05-01 MED ORDER — NICOTINE 21 MG/24HR TD PT24
21.0000 mg | MEDICATED_PATCH | Freq: Every day | TRANSDERMAL | Status: DC | PRN
  Filled 2020-05-01 (×2): qty 1

## 2020-05-01 MED ORDER — LACTATED RINGERS IV SOLN: INTRAVENOUS | Status: DC

## 2020-05-01 MED ORDER — ONDANSETRON HCL 4 MG/2ML IJ SOLN
4.0000 mg | Freq: Once | INTRAMUSCULAR | Status: AC
  Administered 2020-05-01: 4 mg via INTRAVENOUS
  Filled 2020-05-01: qty 2

## 2020-05-01 MED ORDER — UMECLIDINIUM BROMIDE 62.5 MCG/INH IN AEPB
1.0000 | INHALATION_SPRAY | Freq: Every day | RESPIRATORY_TRACT | Status: DC
  Administered 2020-05-02: 1 via RESPIRATORY_TRACT
  Filled 2020-05-01: qty 7

## 2020-05-01 MED ORDER — ONDANSETRON HCL 4 MG/2ML IJ SOLN
4.0000 mg | Freq: Four times a day (QID) | INTRAMUSCULAR | Status: DC | PRN
  Administered 2020-05-01 – 2020-05-09 (×26): 4 mg via INTRAVENOUS
  Filled 2020-05-01 (×26): qty 2

## 2020-05-01 MED ORDER — TIOTROPIUM BROMIDE MONOHYDRATE 2.5 MCG/ACT IN AERS: 2.0000 | INHALATION_SPRAY | Freq: Every day | RESPIRATORY_TRACT | Status: DC

## 2020-05-01 MED ORDER — PANTOPRAZOLE SODIUM 40 MG IV SOLR
40.0000 mg | INTRAVENOUS | Status: DC
  Administered 2020-05-02 – 2020-05-04 (×3): 40 mg via INTRAVENOUS
  Filled 2020-05-01 (×3): qty 40

## 2020-05-01 MED ORDER — HYDROMORPHONE HCL 1 MG/ML IJ SOLN
0.5000 mg | INTRAMUSCULAR | Status: DC | PRN
  Administered 2020-05-01 – 2020-05-02 (×9): 0.5 mg via INTRAVENOUS
  Filled 2020-05-01 (×9): qty 1

## 2020-05-01 MED ORDER — ALBUTEROL SULFATE HFA 108 (90 BASE) MCG/ACT IN AERS
1.0000 | INHALATION_SPRAY | RESPIRATORY_TRACT | Status: DC | PRN
  Filled 2020-05-01: qty 6.7

## 2020-05-01 MED ORDER — LORAZEPAM 2 MG/ML IJ SOLN
0.5000 mg | Freq: Once | INTRAMUSCULAR | Status: AC
  Administered 2020-05-01: 18:00:00 0.5 mg via INTRAVENOUS
  Filled 2020-05-01: qty 1

## 2020-05-01 MED ORDER — PHENOL 1.4 % MT LIQD
1.0000 | OROMUCOSAL | Status: DC | PRN
  Administered 2020-05-02 (×2): 1 via OROMUCOSAL
  Filled 2020-05-01: qty 177

## 2020-05-01 MED ORDER — HYDROMORPHONE HCL 1 MG/ML IJ SOLN
0.5000 mg | Freq: Once | INTRAMUSCULAR | Status: AC
  Administered 2020-05-01: 0.5 mg via INTRAVENOUS
  Filled 2020-05-01: qty 1

## 2020-05-01 MED ORDER — ONDANSETRON 4 MG PO TBDP: 4.0000 mg | ORAL_TABLET | Freq: Once | ORAL | Status: DC | PRN

## 2020-05-01 NOTE — Plan of Care (Signed)

## 2020-05-01 NOTE — Progress Notes (Signed)
Brief note regarding plan, with full H&P to follow:  55 year old female with history of obstruction.  Was admitted to [facility] for partial SBO presenting to [facility] ED today complaining of 2 days of progressive abdominal discomfort associated with intermittent nausea and vomiting over that timeframe associated with nonbilious, nonbloody emesis, with imaging of the abdomen suggestive of partial SBO.  Case was cussed with the on-call general surgeon, Dr. Annita Brod, who recommended admission to the hospital service for further evaluation management suspected SBO, including initiation of conservative/supportive measures as well as NG tube, with plan for general surgery to follow.   Currently on the following: As needed IV Dilaudid, as needed IV Zofran, continuous lactated Ringer's.    Babs Bertin, DO Hospitalist

## 2020-05-01 NOTE — ED Provider Notes (Signed)
[facility] Emergency Department Provider Note  ____________________________________________   Event Date/Time   First MD Initiated Contact with Patient 05/01/20 1432     (approximate)  I have reviewed the triage vital signs and the nursing notes.   HISTORY  Chief Complaint Abdominal Pain and Nausea    HPI [name] is a 55 y.o. female with prior TBI with baseline aphasia who comes in with abdominal pain, nausea, vomiting, diarrhea for the past 2 days.  Her symptoms are moderate, constant, mostly the pain is located above her umbilicus, nothing makes it better, nothing makes it worse.  The patient sits up she sees a lump.  She states that she has never had this previously.  Patient is reports having a prior intracranial hemorrhage secondary to elevated blood pressures.  She also reports Being told she had a small bowel obstruction but it was not and she actually had her appendix removed?.  Patient is not had a period since she was 49.  She denies any Covid contacts.  She is not vaccinated.  She denies any recent antibiotics.  On review of records in November 2028 patient had SBO that required surgical management.  Per chart" her condition did not improve so she was taken to the OR and she underwent robotic assisted laparoscopic appendectomy with lysis of distal ileum adhesion."  To note patient is also had subarachnoid hemorrhage secondary to significant hypertensive.  Blood pressures are well controlled and she has been compliant with her medications          Past Medical History:  Diagnosis Date  . BRBPR (bright red blood per rectum)   . Breast mass, left   . Cancer (HCC)    CERVICAL  . Depression   . Falls    EASILY  . GERD (gastroesophageal reflux disease)   . PTSD (post-traumatic stress disorder)   . PTSD (post-traumatic stress disorder)   . Seizures ([place])   . 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 TBI (traumatic  brain injury) ([place])    SKULL FX 2009  . Vertigo   . Vertigo     Patient Active Problem List   Diagnosis Date Noted  . Colon cancer screening 03/20/2020  . Gastric outlet obstruction 03/20/2020  . Chronic obstructive pulmonary disease ([place]) 06/14/2019  . Tobacco use 06/14/2019  . Traumatic brain injury with loss of consciousness ([place]) 05/08/2019  . 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 S/P laparoscopic appendectomy 02/15/2019  . Nonintractable headache   . Hypertension   . Chronic neck pain   . Vertigo 07/11/2017  . Abdominal pain 11/15/2016  . FH: colon polyps 11/15/2016  . Elevated alkaline phosphatase level 11/15/2016  . BRBPR (bright red blood per rectum) 11/15/2016  . Other dysphagia 11/15/2016  . Other constipation 11/15/2016  . PTSD (post-traumatic stress disorder) 05/18/2016  . Breast mass, left 01/08/2016  . Anxiety and depression 01/08/2016  . Chronic pain 01/08/2016  . Seizure disorder ([place]) 01/08/2016  . GERD (gastroesophageal reflux disease) 01/08/2016  . Difficulty sleeping 08/05/2015  . Mood disorder ([place]) 05/05/2015  . Tobacco abuse counseling 04/15/2015  . Crushing injury of neck 09/27/2011  . Head injury 05/06/2011    Past Surgical History:  Procedure Laterality Date  . abd pain    . APPENDECTOMY    . BACK SURGERY     LUMBAR FUSIONS X 2  .  CATARACT EXTRACTION W/PHACO Right 01/07/2015   Procedure: CATARACT EXTRACTION PHACO AND INTRAOCULAR LENS PLACEMENT (IOC);  Surgeon: Birder Robson, MD;  Location: [facility] ORS;  Service: Ophthalmology;  Laterality: Right;  [place] 0 AP% 0 CDE 0 fluid pack lot #[id number]  . CATARACT EXTRACTION W/PHACO Left 01/28/2015   Procedure: CATARACT EXTRACTION PHACO AND INTRAOCULAR LENS PLACEMENT (IOC);  Surgeon: Birder Robson, MD;  Location: [facility] ORS;  Service: Ophthalmology;  Laterality: Left;  [place] 00:18 AP%7.1 CDE1.32 fluid pack lot # [id number]  . Cerebral  Catheterization for Brain Bleed  02/02/2019   [facility]  . LEEP    . XI ROBOT ABDOMINAL PERINEAL RESECTION N/A 01/24/2019   Procedure: Robot assisted appendectomy ;  Surgeon: Ronny Bacon, MD;  Location: [facility] ORS;  Service: General;  Laterality: N/A;    Prior to Admission medications   Medication Sig Start Date End Date Taking? Authorizing Provider  albuterol (VENTOLIN HFA) 108 (90 Base) MCG/ACT inhaler INL 2 PFS PO Q 4 TO 6 H IF NEEDED 06/14/19   [name], PA-C  ARIPiprazole (ABILIFY) 5 MG tablet Take 5 mg by mouth in the morning and at bedtime. Patient not taking: No sig reported 06/09/19   [provider]  butalbital-acetaminophen-caffeine (FIORICET) 50-325-40 MG tablet Take 1 tablet by mouth every 6 (six) hours as needed for headache. 03/26/19   [name], PA-C  carisoprodol (SOMA) 350 MG tablet Take 700 mg by mouth 2 (two) times daily.     [provider]  Docusate Sodium (DSS) 100 MG CAPS TK ONE C PO BID PRF MILD CONSTIPATION 01/28/19   [provider]  fexofenadine (ALLEGRA) 180 MG tablet Take 180 mg by mouth daily.     [provider]  lisinopril-hydrochlorothiazide (ZESTORETIC) 10-12.5 MG tablet TAKE 1 TABLET BY MOUTH EVERY DAY 04/30/20   [name], PA-C  metoCLOPramide (REGLAN) 5 MG tablet Take 1-2 tablets (5-10 mg total) by mouth every 8 (eight) hours as needed for nausea or vomiting. Patient not taking: Reported on 03/20/2020 01/17/20   [name], PA-C  oxyCODONE (OXY IR/ROXICODONE) 5 MG immediate release tablet  02/19/19   [provider]  pantoprazole (PROTONIX) 40 MG tablet Take 1 tablet (40 mg total) by mouth 2 (two) times daily. 07/16/19   [name], PA-C  Tiotropium Bromide Monohydrate (SPIRIVA RESPIMAT) 2.5 MCG/ACT AERS Inhale 2 puffs into the lungs daily. 06/14/19   [name], PA-C    Allergies Contrast media [iodinated diagnostic agents], Diphenhydramine hcl, Adhesive [tape], Iodine, Prednisone, and Sulfa  antibiotics  Family History  Problem Relation Age of Onset  . Ovarian cancer Maternal Aunt        40's  . Depression Mother   . Heart disease Father   . Hypertension Father   . Diabetes Father   . Post-traumatic stress disorder Sister   . Hypertension Sister   . Diabetes Brother   . Depression Brother   . Hypertension Brother     Social History Social History   Tobacco Use  . Smoking status: Current Every Day Smoker    Packs/day: 1.00    Types: Cigarettes  . Smokeless tobacco: Never Used  Vaping Use  . Vaping Use: Every day  . Substances: CBD  Substance Use Topics  . Alcohol use: No  . Drug use: Yes    Types: Marijuana      Review of Systems Constitutional: No fever/chills Eyes: No visual changes. ENT: No sore throat. Cardiovascular: Denies chest pain. Respiratory: Denies shortness of breath. Gastrointestinal: Abdominal pain,  nausea, vomiting, diarrhea Genitourinary: Negative for dysuria. Musculoskeletal: Negative for back pain. Skin: Negative for rash. Neurological: Negative for headaches, focal weakness or numbness. All other ROS negative ____________________________________________   PHYSICAL EXAM:  VITAL SIGNS: ED Triage Vitals  Enc Vitals Group     BP 05/01/20 1417 114/84     Pulse Rate 05/01/20 1417 (!) 116     Resp 05/01/20 1417 20     Temp 05/01/20 1417 98.1 F (36.7 C)     Temp Source 05/01/20 1417 Oral     SpO2 05/01/20 1417 95 %     Weight 05/01/20 1419 144 lb (65.3 kg)     Height 05/01/20 1419 5\' 8"  (1.727 m)     Head Circumference --      Peak Flow --      Pain Score 05/01/20 1418 6     Pain Loc --      Pain Edu? --      Excl. in [place]? --     Constitutional: Alert and oriented. Well appearing and in no acute distress. Eyes: Conjunctivae are normal. EOMI. Head: Atraumatic. Nose: No congestion/rhinnorhea. Mouth/Throat: Mucous membranes are moist.   Neck: No stridor. Trachea Midline. FROM Cardiovascular: Normal rate, regular  rhythm. Grossly normal heart sounds.  Good peripheral circulation. Respiratory: Normal respiratory effort.  No retractions. Lungs CTAB. Gastrointestinal: Some distention noted when patient sits up.  With tenderness around it, no guarding. Musculoskeletal: No lower extremity tenderness nor edema.  No joint effusions. Neurologic: Baseline aphasia, no weakness of arms or legs. Skin:  Skin is warm, dry and intact. No rash noted. Psychiatric: Mood and affect are normal.  Aphasia at baseline GU: Deferred   ____________________________________________   LABS (all labs ordered are listed, but only abnormal results are displayed)  Labs Reviewed  COMPREHENSIVE METABOLIC PANEL - Abnormal; Notable for the following components:      Result Value   Sodium 134 (*)    Chloride 96 (*)    Glucose, Bld 119 (*)    Total Protein 8.3 (*)    Alkaline Phosphatase 172 (*)    All other components within normal limits  CBC - Abnormal; Notable for the following components:   WBC 14.6 (*)    RBC 5.67 (*)    Hemoglobin 16.8 (*)    HCT 48.9 (*)    Platelets 415 (*)    All other components within normal limits  RESP PANEL BY RT-PCR (FLU A&B, COVID) ARPGX2  LIPASE, BLOOD  URINALYSIS, COMPLETE (UACMP) WITH MICROSCOPIC  POC URINE PREG, ED   ____________________________________________   ED ECG REPORT I, Vanessa [place], the attending physician, personally viewed and interpreted this ECG.  Sinus tachycardia rate of 102, no ST elevation, no T wave inversions, normal intervals ____________________________________________  RADIOLOGY   Official radiology report(s): CT ABDOMEN PELVIS WO CONTRAST  Result Date: 05/01/2020 CLINICAL DATA:  Nausea, vomiting and diarrhea x2 days. EXAM: CT ABDOMEN AND PELVIS WITHOUT CONTRAST TECHNIQUE: Multidetector CT imaging of the abdomen and pelvis was performed following the standard protocol without IV contrast. COMPARISON:  January 30, 2019 FINDINGS: Lower chest: No acute  abnormality. Hepatobiliary: No focal liver abnormality is seen. No gallstones, gallbladder wall thickening, or biliary dilatation. Pancreas: Unremarkable. No pancreatic ductal dilatation or surrounding inflammatory changes. Spleen: Normal in size without focal abnormality. Adrenals/Urinary Tract: Adrenal glands are unremarkable. Kidneys are normal, without renal calculi, focal lesion, or hydronephrosis. The urinary bladder is empty and subsequently limited in evaluation. Stomach/Bowel: Stomach is within normal limits.  The appendix is surgically absent. Multiple mildly dilated small bowel loops are seen within the abdomen (maximum small bowel diameter of approximately 2.9 cm). A gradual transition zone is seen within the right lower quadrant (axial CT images 46 through 50, CT series number 2) Vascular/Lymphatic: Aortic atherosclerosis. No enlarged abdominal or pelvic lymph nodes. Reproductive: Uterus and bilateral adnexa are unremarkable. Other: No abdominal wall hernia or abnormality. No abdominopelvic ascites or free air. Musculoskeletal: Approximately 2 mm anterolisthesis of the L4 vertebral body is noted on L5. IMPRESSION: 1. Findings consistent with a partial small bowel obstruction. 2. Aortic atherosclerosis. Aortic Atherosclerosis (ICD10-I70.0). Electronically Signed   By: Virgina Norfolk M.D.   On: 05/01/2020 15:41    ____________________________________________   PROCEDURES  Procedure(s) performed (including Critical Care):  .1-3 Lead EKG Interpretation Performed by: Vanessa [place], MD Authorized by: Vanessa [place], MD     Interpretation: normal     ECG rate:  80s   ECG rate assessment: normal     Rhythm: sinus rhythm     Ectopy: none     Conduction: normal   Comments:     Initially tachycardic but after fluids is normal sinus     ____________________________________________   INITIAL IMPRESSION / ASSESSMENT AND PLAN / ED COURSE  Bernadean Saling Natal was evaluated in Emergency  Department on 05/01/2020 for the symptoms described in the history of present illness. She was evaluated in the context of the global COVID-19 pandemic, which necessitated consideration that the patient might be at risk for infection with the SARS-CoV-2 virus that causes COVID-19. Institutional protocols and algorithms that pertain to the evaluation of patients at risk for COVID-19 are in a state of rapid change based on information released by regulatory bodies including the CDC and federal and state organizations. These policies and algorithms were followed during the patient's care in the ED.    Patient is a 55 year old who comes in for abdominal pain, nausea, vomiting.  Will get CT scan to evaluate for hernia, SBO, diverticulitis.  Labs evaluate for [place] abnormalities, AKI.  Will give 1 L of fluid, Dilaudid, Zofran to help with symptoms.  We will keep patient on cardiac monitor due to her tachycardia  Slightly elevated white count 14.6 otherwise patient is afebrile and heart rates come down with fluids.  CT scan consistent with partial small bowel obstruction.  Discussed with Dr. Peyton Najjar from surgery who recommended NG tube and admission to medicine     ____________________________________________   FINAL CLINICAL IMPRESSION(S) / ED DIAGNOSES   Final diagnoses:  Partial small bowel obstruction ([place])      MEDICATIONS GIVEN DURING THIS VISIT:  Medications  sodium chloride 0.9 % bolus 1,000 mL (1,000 mLs Intravenous New Bag/Given 05/01/20 1545)  HYDROmorphone (DILAUDID) injection 0.5 mg (0.5 mg Intravenous Given 05/01/20 1452)  ondansetron (ZOFRAN) injection 4 mg (4 mg Intravenous Given 05/01/20 1452)     ED Discharge Orders    None       Note:  This document was prepared using Dragon voice recognition software and may include unintentional dictation errors.   Vanessa Hodges, MD 05/01/20 [phone]

## 2020-05-01 NOTE — ED Triage Notes (Signed)
Pt with N/V/D x 2 days, today pt states she feels a lump above her umbilicus. Pt has golfball sized soft lump directly above umbilicus. Pt states pain all the time and increased pain when lump is pressed. Pt states continued nausea at this time.

## 2020-05-01 NOTE — Consult Note (Signed)
SURGICAL CONSULTATION NOTE   HISTORY OF PRESENT ILLNESS (HPI):  55 y.o. female presented to [facility] ED for evaluation of abdominal pain nausea vomiting and diarrhea for the last 2 days. Patient reports she has been having progressive abdominal pain.  Abdominal pain is generalized without specific location.  There is no pain radiation.  There has been no alleviating or aggravating factors.  The patient reported that he has been having multiple diarrhea episodes yesterday.  Today denies passing gas.  Denies any fever or chills.  Patient reports she had similar symptoms 2 years ago.  She had robotic assisted laparoscopic exploration with lysis of adhesion and appendectomy.  She reported that she has never felt normal since then.  At the ED she was found with leukocytosis of 14,000 and elevated hemoglobin most likely due to dehydration.  CT of the abdomen and pelvis shows multiple dilated loops of small bowel up to 3 cm.  There is no free air or free fluid.  There is distal small bowel decompressed and large intestine is also decompressed.  Surgery is consulted by Dr. Jari Pigg in this context for evaluation and management of small bowel obstruction.  PAST MEDICAL HISTORY (PMH):  Past Medical History:  Diagnosis Date  . BRBPR (bright red blood per rectum)   . Breast mass, left   . Cancer (HCC)    CERVICAL  . Depression   . Falls    EASILY  . GERD (gastroesophageal reflux disease)   . PTSD (post-traumatic stress disorder)   . PTSD (post-traumatic stress disorder)   . Seizures ([place])   . 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 TBI (traumatic brain injury) ([place])    SKULL FX 2009  . Vertigo   . Vertigo      PAST SURGICAL HISTORY ([place]):  Past Surgical History:  Procedure Laterality Date  . abd pain    . APPENDECTOMY    . BACK SURGERY     LUMBAR FUSIONS X 2  . CATARACT EXTRACTION W/PHACO Right 01/07/2015   Procedure: CATARACT EXTRACTION PHACO AND INTRAOCULAR LENS  PLACEMENT (IOC);  Surgeon: Birder Robson, MD;  Location: [facility] ORS;  Service: Ophthalmology;  Laterality: Right;  [place] 0 AP% 0 CDE 0 fluid pack lot #[id number]  . CATARACT EXTRACTION W/PHACO Left 01/28/2015   Procedure: CATARACT EXTRACTION PHACO AND INTRAOCULAR LENS PLACEMENT (IOC);  Surgeon: Birder Robson, MD;  Location: [facility] ORS;  Service: Ophthalmology;  Laterality: Left;  [place] 00:18 AP%7.1 CDE1.32 fluid pack lot # [id number]  . Cerebral Catheterization for Brain Bleed  02/02/2019   [facility]  . LEEP    . XI ROBOT ABDOMINAL PERINEAL RESECTION N/A 01/24/2019   Procedure: Robot assisted appendectomy ;  Surgeon: Ronny Bacon, MD;  Location: [facility] ORS;  Service: General;  Laterality: N/A;     MEDICATIONS:  Prior to Admission medications   Medication Sig Start Date End Date Taking? Authorizing Provider  Tiotropium Bromide Monohydrate (SPIRIVA RESPIMAT) 2.5 MCG/ACT AERS Inhale 2 puffs into the lungs daily. 06/14/19  Yes [name], PA-C  albuterol (VENTOLIN HFA) 108 (90 Base) MCG/ACT inhaler INL 2 PFS PO Q 4 TO 6 H IF NEEDED 06/14/19   [name], PA-C  butalbital-acetaminophen-caffeine (FIORICET) [phone] MG tablet Take 1 tablet by mouth every 6 (six) hours as needed for headache. 03/26/19   [name], PA-C  carisoprodol (SOMA) 350 MG tablet Take 700 mg by mouth 2 (two) times daily.     [provider]  Docusate  Sodium (DSS) 100 MG CAPS TK ONE C PO BID PRF MILD CONSTIPATION 01/28/19   [provider]  EMGALITY 120 MG/ML SOAJ Inject 1 mL into the muscle every 28 (twenty-eight) days. 04/24/20   [provider]  fexofenadine (ALLEGRA) 180 MG tablet Take 180 mg by mouth daily.     [provider]  lisinopril-hydrochlorothiazide (ZESTORETIC) 10-12.5 MG tablet TAKE 1 TABLET BY MOUTH EVERY DAY 04/30/20   [name], PA-C  oxyCODONE (OXY IR/ROXICODONE) 5 MG immediate release tablet Take 5 mg by mouth 2 (two) times daily. 02/19/19   [provider]   pantoprazole (PROTONIX) 40 MG tablet Take 1 tablet (40 mg total) by mouth 2 (two) times daily. 07/16/19   [name], PA-C     ALLERGIES:  Allergies  Allergen Reactions  . Contrast Media [Iodinated Diagnostic Agents]   . Diphenhydramine Hcl Hives and Itching  . Adhesive [Tape]     PAPER TAPE OK  . Iodine Other (See Comments)  . Prednisone Anxiety  . Sulfa Antibiotics Rash     SOCIAL HISTORY:  Social History   Socioeconomic History  . Marital status: Divorced    Spouse name: Not on file  . Number of children: 1  . Years of education: Not on file  . Highest education level: Associate degree: occupational, Hotel manager, or vocational program  Occupational History    Comment: DISABLED  Tobacco Use  . Smoking status: Current Every Day Smoker    Packs/day: 1.00    Types: Cigarettes  . Smokeless tobacco: Never Used  Vaping Use  . Vaping Use: Every day  . Substances: CBD  Substance and Sexual Activity  . Alcohol use: No  . Drug use: Yes    Types: Marijuana  . Sexual activity: Never  Other Topics Concern  . Not on file  Social History Narrative  . Not on file   Social Determinants of Health   Financial Resource Strain: Medium Risk  . Difficulty of Paying Living Expenses: Somewhat hard  Food Insecurity: Food Insecurity Present  . Worried About Charity fundraiser in the Last Year: Sometimes true  . Ran Out of Food in the Last Year: Sometimes true  Transportation Needs: No Transportation Needs  . Lack of Transportation (Medical): No  . Lack of Transportation (Non-Medical): No  Physical Activity: Insufficiently Active  . Days of Exercise per Week: 1 day  . Minutes of Exercise per Session: 20 min  Stress: Stress Concern Present  . Feeling of Stress : Very much  Social Connections: Socially Isolated  . Frequency of Communication with Friends and Family: More than three times a week  . Frequency of Social Gatherings with Friends and Family: Once a week  . Attends  Religious Services: Never  . Active Member of Clubs or Organizations: No  . Attends Archivist Meetings: Never  . Marital Status: Divorced  Human resources officer Violence: Not At Risk  . Fear of Current or Ex-Partner: No  . Emotionally Abused: No  . Physically Abused: No  . Sexually Abused: No      FAMILY HISTORY:  Family History  Problem Relation Age of Onset  . Ovarian cancer Maternal Aunt        40's  . Depression Mother   . Heart disease Father   . Hypertension Father   . Diabetes Father   . Post-traumatic stress disorder Sister   . Hypertension Sister   . Diabetes Brother   . Depression Brother   . Hypertension Brother  REVIEW OF SYSTEMS:  Constitutional: denies weight loss, fever, chills, or sweats  Eyes: denies any other vision changes, history of eye injury  ENT: denies sore throat, hearing problems  Respiratory: denies shortness of breath, wheezing  Cardiovascular: denies chest pain, palpitations  Gastrointestinal: Positive abdominal pain, nausea and vomiting and diarrhea Genitourinary: denies burning with urination or urinary frequency Musculoskeletal: denies any other joint pains or cramps  Skin: denies any other rashes or skin discolorations  Neurological: denies any other headache, dizziness, weakness  Psychiatric: denies any other depression, anxiety   All other review of systems were negative   VITAL SIGNS:  Temp:  [97.8 F (36.6 C)-98.1 F (36.7 C)] 97.8 F (36.6 C) (03/03 1800) Pulse Rate:  [84-116] 85 (03/03 1800) Resp:  [16-20] 16 (03/03 1800) BP: (112-128)/(82-95) 128/95 (03/03 1800) SpO2:  [95 %-99 %] 99 % (03/03 1800) Weight:  [65.3 kg] 65.3 kg (03/03 1419)     Height: 5\' 8"  (172.7 cm) Weight: 65.3 kg BMI (Calculated): 21.9   INTAKE/OUTPUT:  This shift: No intake/output data recorded.  Last 2 shifts: @IOLAST2SHIFTS @   PHYSICAL EXAM:  Constitutional:  -- Normal body habitus  -- Awake, alert, and oriented x3  Eyes:  -- Pupils  equally round and reactive to light  -- No scleral icterus  Ear, nose, and throat:  -- No jugular venous distension  Pulmonary:  -- No crackles  -- Equal breath sounds bilaterally -- Breathing non-labored at rest Cardiovascular:  -- S1, S2 present  -- No pericardial rubs Gastrointestinal:  -- Abdomen soft, mild tender, distended, no guarding or rebound tenderness -- No abdominal masses appreciated, pulsatile or otherwise  Musculoskeletal and Integumentary:  -- Wounds: None appreciated -- Extremities: B/L UE and LE FROM, hands and feet warm, no edema  Neurologic:  -- Motor function: intact and symmetric -- Sensation: intact and symmetric   Labs:  CBC Latest Ref Rng & Units 05/01/2020 01/17/2020 01/30/2019  WBC 4.0 - 10.5 K/uL 14.6(H) 9.9 8.4  Hemoglobin 12.0 - 15.0 g/dL 16.8(H) 15.8(H) 14.3  Hematocrit 36.0 - 46.0 % 48.9(H) 46.2(H) 42.3  Platelets 150 - 400 K/uL 415(H) 392 320   CMP Latest Ref Rng & Units 05/01/2020 03/20/2020 01/17/2020  Glucose 70 - 99 mg/dL 119(H) 90 95  BUN 6 - 20 mg/dL 19 10 16   Creatinine 0.44 - 1.00 mg/dL 0.81 0.96 0.80  Sodium 135 - 145 mmol/L 134(L) 143 139  Potassium 3.5 - 5.1 mmol/L 3.8 4.5 4.9  Chloride 98 - 111 mmol/L 96(L) 101 98  CO2 22 - 32 mmol/L 24 26 30   Calcium 8.9 - 10.3 mg/dL 9.7 9.7 10.6(H)  Total Protein 6.5 - 8.1 g/dL 8.3(H) 6.9 7.0  Total Bilirubin 0.3 - 1.2 mg/dL 0.9 <0.2 0.3  Alkaline Phos 38 - 126 U/L 172(H) 179(H) -  AST 15 - 41 U/L 19 14 15   ALT 0 - 44 U/L 21 15 11     Imaging studies:  EXAM: CT ABDOMEN AND PELVIS WITHOUT CONTRAST  TECHNIQUE: Multidetector CT imaging of the abdomen and pelvis was performed following the standard protocol without IV contrast.  COMPARISON:  January 30, 2019  FINDINGS: Lower chest: No acute abnormality.  Hepatobiliary: No focal liver abnormality is seen. No gallstones, gallbladder wall thickening, or biliary dilatation.  Pancreas: Unremarkable. No pancreatic ductal dilatation  or surrounding inflammatory changes.  Spleen: Normal in size without focal abnormality.  Adrenals/Urinary Tract: Adrenal glands are unremarkable. Kidneys are normal, without renal calculi, focal lesion, or hydronephrosis. The urinary bladder is  empty and subsequently limited in evaluation.  Stomach/Bowel: Stomach is within normal limits. The appendix is surgically absent. Multiple mildly dilated small bowel loops are seen within the abdomen (maximum small bowel diameter of approximately 2.9 cm). A gradual transition zone is seen within the right lower quadrant (axial CT images 46 through 50, CT series number 2)  Vascular/Lymphatic: Aortic atherosclerosis. No enlarged abdominal or pelvic lymph nodes.  Reproductive: Uterus and bilateral adnexa are unremarkable.  Other: No abdominal wall hernia or abnormality. No abdominopelvic ascites or free air.  Musculoskeletal: Approximately 2 mm anterolisthesis of the L4 vertebral body is noted on L5.  IMPRESSION: 1. Findings consistent with a partial small bowel obstruction. 2. Aortic atherosclerosis.  Aortic Atherosclerosis (ICD10-I70.0).   Electronically Signed   By: Virgina Norfolk M.D.   On: 05/01/2020 15:41  Assessment/Plan:  55 y.o. female with small bowel obstruction, complicated by pertinent comorbidities including history of traumatic brain injury, hypertension, history of subarachnoid hemorrhage, GERD, constipation, seizures.  Patient with CT findings consistent with small bowel obstruction.  NGT was already in place.  Abdominal x-ray shows NGT in place.  We will continue with IV hydration, bowel rest, NGT to low intermittent suction, replenishment of electrolytes.  We will continue following her clinically and with imaging.  I encouraged the patient to ambulate.  No other indication for DVT prophylaxis.  We will follow along.  Arnold Braithwaite, MD

## 2020-05-01 NOTE — H&P (Signed)
History and Physical    PLEASE NOTE THAT DRAGON DICTATION SOFTWARE WAS USED IN THE CONSTRUCTION OF THIS NOTE.   [name] VQM:[id number] DOB: 09/28/1965 DOA: 05/01/2020  PCP: [name], PA-C Patient coming from: home   I have personally briefly reviewed patient's old medical records in [place]  Chief Complaint: Abdominal pain  HPI: [name] is a 55 y.o. female with medical history significant for COPD, chronic pain syndrome, hypertension, GERD, suspected gastric outlet obstruction, who is admitted to [facility] on 05/01/2020 with suspected partial small bowel obstruction after presenting from home to [facility] ED complaining of abdominal pain.   The patient reports 2 days of progressive sharp abdominal pain is located primarily in the bilateral upper abdominal quadrants, without radiation process location, including no radiation into the back.  She reports exacerbation of this discomfort with movement as well as with palpation of the abdomen.  She also notes exacerbation of the abdominal pain with attempting to eat or drink anything over the last 1 to 2 days.  She reports associated intermittent nausea resulting in 3-4 episodes of nonbloody, nonbilious emesis over the last 1 to 2 days.  Reports passage of small amount of stool over the last day, with most recent bowel movement occurring a few hours prior to presenting to the ED today.  Denies any associated melena or hematochezia.  Not associated with any recent unintentional weight loss.  Denies any associated subjective fever, chills, rigors, or generalized myalgias.  No recent traveling.  Per my discussion with the emergency department physician, there was a question of whether the patient has a history of small bowel obstruction prompting hospitalization in 2020.  The patient adamantly denies any history of spelled obstruction, and states that the 2020 hospitalization was in the setting of acute appendicitis in  the absence of any associated or immediately ensuing small bowel obstruction.   The patient reports that she follows with outpatient gastroenterology in setting of suspected gastric outlet obstruction.  She conveys that she is scheduled for a gastric emptying study to occur on 05/16/2020.  She also acknowledges a history of GERD for which she is on Protonix at home.  Denies any recent chest pain, palpitations, diaphoresis, shortness of breath, dizziness, presyncope, or syncope.  She also denies any recent dysuria, gross hematuria, or change in urinary urgency/frequency.  No recent rash.  Denies any recent headache, neck stiffness, rhinitis, rhinorrhea, sore throat, wheezing.  No known COVID-19 exposures.     ED Course:  Vital signs in the ED were notable for the following: Temperature max 98.1, heart rate initially noted to be 116, which decreased to 85 following interval administration of IV fluids and IV pain medications, as further noted below; blood pressure 114/84 -120/95; respiratory rate 16-20, oxygen saturation 95 to 99% on room air.  Labs were notable for the following: CMP was notable for the following: Bicarbonate 24, creatinine 0.81, glucose 119, and liver enzymes were found to be unremarkable.  Lipase 20.  CBC notable for white blood cell count of 14,600, hemoglobin 16.8, and platelets 415.  Screening nasopharyngeal COVID-19 PCR performed in the ED today, and found to be negative.  CT abdomen/pelvis without contrast showed multiple mildly dilated small bowel loops with max diameter of 2.9 cm, with gradual transition zone noted in the right lower quadrant in the absence of any associated evidence of abscess or bowel perforation.  The patient's case and imaging were discussed with the on-call general surgeon, Dr.  Cintron, recommended admission to the hospital service for further evaluation management of suspected partial small bowel striction, including conservative measures, with plan to  continue to follow during this hospitalization.   While in the ED, the following were administered: Dilaudid 0.5 mg IV x1, Zofran 4 mg IV x1, and a 1 L normal saline bolus.  Additionally, NG tube was placed and attached to low, intermittent wall suction.     Review of Systems: As per HPI otherwise 10 point review of systems negative.   Past Medical History:  Diagnosis Date  . BRBPR (bright red blood per rectum)   . Breast mass, left   . Cancer (HCC)    CERVICAL  . Depression   . Falls    EASILY  . GERD (gastroesophageal reflux disease)   . PTSD (post-traumatic stress disorder)   . PTSD (post-traumatic stress disorder)   . Seizures ([place])   . 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 TBI (traumatic brain injury) ([place])    SKULL FX 2009  . Vertigo   . Vertigo     Past Surgical History:  Procedure Laterality Date  . abd pain    . APPENDECTOMY    . BACK SURGERY     LUMBAR FUSIONS X 2  . CATARACT EXTRACTION W/PHACO Right 01/07/2015   Procedure: CATARACT EXTRACTION PHACO AND INTRAOCULAR LENS PLACEMENT (IOC);  Surgeon: Birder Robson, MD;  Location: [facility] ORS;  Service: Ophthalmology;  Laterality: Right;  [place] 0 AP% 0 CDE 0 fluid pack lot #[id number]  . CATARACT EXTRACTION W/PHACO Left 01/28/2015   Procedure: CATARACT EXTRACTION PHACO AND INTRAOCULAR LENS PLACEMENT (IOC);  Surgeon: Birder Robson, MD;  Location: [facility] ORS;  Service: Ophthalmology;  Laterality: Left;  [place] 00:18 AP%7.1 CDE1.32 fluid pack lot # [id number]  . Cerebral Catheterization for Brain Bleed  02/02/2019   [facility]  . LEEP    . XI ROBOT ABDOMINAL PERINEAL RESECTION N/A 01/24/2019   Procedure: Robot assisted appendectomy ;  Surgeon: Ronny Bacon, MD;  Location: [facility] ORS;  Service: General;  Laterality: N/A;    Social History:  reports that she has been smoking cigarettes. She has been smoking about 1.00 pack per day. She has never used smokeless tobacco. She reports current drug  use. Drug: Marijuana. She reports that she does not drink alcohol.   Allergies  Allergen Reactions  . Contrast Media [Iodinated Diagnostic Agents]   . Diphenhydramine Hcl Hives and Itching  . Adhesive [Tape]     PAPER TAPE OK  . Iodine Other (See Comments)  . Prednisone Anxiety  . Sulfa Antibiotics Rash    Family History  Problem Relation Age of Onset  . Ovarian cancer Maternal Aunt        40's  . Depression Mother   . Heart disease Father   . Hypertension Father   . Diabetes Father   . Post-traumatic stress disorder Sister   . Hypertension Sister   . Diabetes Brother   . Depression Brother   . Hypertension Brother      Prior to Admission medications   Medication Sig Start Date End Date Taking? Authorizing Provider  Tiotropium Bromide Monohydrate (SPIRIVA RESPIMAT) 2.5 MCG/ACT AERS Inhale 2 puffs into the lungs daily. 06/14/19  Yes [name], PA-C  albuterol (VENTOLIN HFA) 108 (90 Base) MCG/ACT inhaler INL 2 PFS PO Q 4 TO 6 H IF NEEDED 06/14/19   [name], PA-C  butalbital-acetaminophen-caffeine (FIORICET) 50-325-40 MG tablet Take 1 tablet by  mouth every 6 (six) hours as needed for headache. 03/26/19   [name], PA-C  carisoprodol (SOMA) 350 MG tablet Take 700 mg by mouth 2 (two) times daily.     [provider]  Docusate Sodium (DSS) 100 MG CAPS TK ONE C PO BID PRF MILD CONSTIPATION 01/28/19   [provider]  EMGALITY 120 MG/ML SOAJ Inject 1 mL into the muscle every 28 (twenty-eight) days. 04/24/20   [provider]  fexofenadine (ALLEGRA) 180 MG tablet Take 180 mg by mouth daily.     [provider]  lisinopril-hydrochlorothiazide (ZESTORETIC) 10-12.5 MG tablet TAKE 1 TABLET BY MOUTH EVERY DAY 04/30/20   [name], PA-C  oxyCODONE (OXY IR/ROXICODONE) 5 MG immediate release tablet Take 5 mg by mouth 2 (two) times daily. 02/19/19   [provider]  pantoprazole (PROTONIX) 40 MG tablet Take 1 tablet (40 mg total) by mouth 2  (two) times daily. 07/16/19   [name], PA-C     Objective    Physical Exam: Vitals:   05/01/20 1530 05/01/20 1630 05/01/20 1800 05/01/20 2055  BP: 112/82 125/88 (!) 128/95 129/89  Pulse: 84 89 85 78  Resp: 19 19 16 18   Temp:   97.8 F (36.6 C) 97.8 F (36.6 C)  TempSrc:    Oral  SpO2: 95% 99% 99% 98%  Weight:      Height:        General: appears to be stated age; alert, oriented; NGT noted. Skin: warm, dry, no rash Head:  AT/[place] Mouth:  Oral mucosa membranes appear dry, normal dentition Neck: supple; trachea midline Heart:  RRR; did not appreciate any M/R/G Lungs: CTAB, did not appreciate any wheezes, rales, or rhonchi Abdomen: hypoactive bowel sonds; soft, ND; tenderness over the bilateral upper abdominal quadrants in the absence of any associated guarding, rigidity, or rebound tenderness. Vascular: 2+ pedal pulses b/l; 2+ radial pulses b/l Extremities: no peripheral edema, no muscle wasting Neuro: strength and sensation intact in upper and lower extremities b/l    Labs on Admission: I have personally reviewed following labs and imaging studies  CBC: Recent Labs  Lab 05/01/20 1424  WBC 14.6*  HGB 16.8*  HCT 48.9*  MCV 86.2  PLT 998*   Basic Metabolic Panel: Recent Labs  Lab 05/01/20 1424  NA 134*  K 3.8  CL 96*  CO2 24  GLUCOSE 119*  BUN 19  CREATININE 0.81  CALCIUM 9.7  MG 2.4   GFR: Estimated Creatinine Clearance: 81 mL/min (by C-G formula based on SCr of 0.81 mg/dL). Liver Function Tests: Recent Labs  Lab 05/01/20 1424  AST 19  ALT 21  ALKPHOS 172*  BILITOT 0.9  PROT 8.3*  ALBUMIN 4.6   Recent Labs  Lab 05/01/20 1424  LIPASE 28   No results for input(s): AMMONIA in the last 168 hours. Coagulation Profile: No results for input(s): INR, PROTIME in the last 168 hours. Cardiac Enzymes: No results for input(s): CKTOTAL, CKMB, CKMBINDEX, TROPONINI in the last 168 hours. BNP (last 3 results) No results for input(s): PROBNP in the  last 8760 hours. HbA1C: No results for input(s): HGBA1C in the last 72 hours. CBG: No results for input(s): GLUCAP in the last 168 hours. Lipid Profile: No results for input(s): CHOL, HDL, LDLCALC, TRIG, CHOLHDL, LDLDIRECT in the last 72 hours. Thyroid Function Tests: No results for input(s): TSH, T4TOTAL, FREET4, T3FREE, THYROIDAB in the last 72 hours. Anemia Panel: No results for input(s): VITAMINB12, FOLATE, FERRITIN, TIBC, IRON, RETICCTPCT in  the last 72 hours. Urine analysis:    Component Value Date/Time   COLORURINE AMBER (A) 05/01/2020 2041   APPEARANCEUR HAZY (A) 05/01/2020 2041   APPEARANCEUR Cloudy 12/17/2013 0150   LABSPEC 1.027 05/01/2020 2041   LABSPEC 1.026 12/17/2013 0150   PHURINE 5.0 05/01/2020 2041   GLUCOSEU NEGATIVE 05/01/2020 2041   GLUCOSEU Negative 12/17/2013 0150   HGBUR SMALL (A) 05/01/2020 2041   BILIRUBINUR NEGATIVE 05/01/2020 2041   BILIRUBINUR Negative 12/17/2013 0150   KETONESUR NEGATIVE 05/01/2020 2041   PROTEINUR 30 (A) 05/01/2020 2041   NITRITE NEGATIVE 05/01/2020 2041   LEUKOCYTESUR NEGATIVE 05/01/2020 2041   LEUKOCYTESUR 3+ 12/17/2013 0150    Radiological Exams on Admission: CT ABDOMEN PELVIS WO CONTRAST  Result Date: 05/01/2020 CLINICAL DATA:  Nausea, vomiting and diarrhea x2 days. EXAM: CT ABDOMEN AND PELVIS WITHOUT CONTRAST TECHNIQUE: Multidetector CT imaging of the abdomen and pelvis was performed following the standard protocol without IV contrast. COMPARISON:  January 30, 2019 FINDINGS: Lower chest: No acute abnormality. Hepatobiliary: No focal liver abnormality is seen. No gallstones, gallbladder wall thickening, or biliary dilatation. Pancreas: Unremarkable. No pancreatic ductal dilatation or surrounding inflammatory changes. Spleen: Normal in size without focal abnormality. Adrenals/Urinary Tract: Adrenal glands are unremarkable. Kidneys are normal, without renal calculi, focal lesion, or hydronephrosis. The urinary bladder is empty and  subsequently limited in evaluation. Stomach/Bowel: Stomach is within normal limits. The appendix is surgically absent. Multiple mildly dilated small bowel loops are seen within the abdomen (maximum small bowel diameter of approximately 2.9 cm). A gradual transition zone is seen within the right lower quadrant (axial CT images 46 through 50, CT series number 2) Vascular/Lymphatic: Aortic atherosclerosis. No enlarged abdominal or pelvic lymph nodes. Reproductive: Uterus and bilateral adnexa are unremarkable. Other: No abdominal wall hernia or abnormality. No abdominopelvic ascites or free air. Musculoskeletal: Approximately 2 mm anterolisthesis of the L4 vertebral body is noted on L5. IMPRESSION: 1. Findings consistent with a partial small bowel obstruction. 2. Aortic atherosclerosis. Aortic Atherosclerosis (ICD10-I70.0). Electronically Signed   By: Virgina Norfolk M.D.   On: 05/01/2020 15:41   DG Abdomen 1 View  Result Date: 05/01/2020 CLINICAL DATA:  Check gastric catheter placement EXAM: ABDOMEN - 1 VIEW COMPARISON:  None. FINDINGS: Gastric catheter is noted extending into the stomach. No free air is seen. IMPRESSION: Gastric catheter within the stomach. Electronically Signed   By: Inez Catalina M.D.   On: 05/01/2020 16:57     Assessment/Plan   [name] is a 55 y.o. female with medical history significant for COPD, chronic pain syndrome, hypertension, GERD, suspected gastric outlet obstruction, who is admitted to [facility] on 05/01/2020 with suspected partial small bowel obstruction after presenting from home to [facility] ED complaining of abdominal pain.    Principal Problem:   SBO (small bowel obstruction) (HCC) Active Problems:   GERD (gastroesophageal reflux disease)   Abdominal pain   Hypertension   Chronic obstructive pulmonary disease (HCC)   Tobacco use   Leukocytosis     #) partial small bowel obstruction: dx on the basis of acute onset of progressive abdominal  pain over the last 2 days associated with nausea/vomiting, with CT abdomen/pelvis showing multiple mildly dilated small bowel loops with gradual transition zone in the right lower quadrant suggestive of partial small bowel junction in the absence of any associated evidence of abscess or bowel perforation.  In the context of a history of prior abdominal surgery, most likely etiology syndrome postoperative adhesions.  Evidence of  peritoneal signs on initial physical exam.  Will initiate conservative measures, and observe for return of normal bowel function, as further described below.  NGT placed in the ED this evening.  The patient's case and imaging were discussed with the on-call general surgeon, Dr. Peyton Najjar, recommended admission to the hospital service for further evaluation management of suspected partial small bowel striction, including conservative measures, with plan to continue to follow during this hospitalization.     Plan: Strict NPO. LR @ 100 cc per hour. Prn IV Dilaudid. Prn IV Zofran. NG tube attached to low, intermittent wall suction. SCD's for DVT prophylaxis in case the patient should ultimately require surgical intervention.  Recheck CMP and CBC in the morning.General surgery consult, as above.  Add on serum magnesium level.       #) Leukocytosis: Presenting CBC reflects mildly elevated white blood cell count of 14,600, which is suspected to be inflammatory in nature in the setting of presenting partial small bowel obstruction as well as element of hemoconcentration in the context of dehydration, with remainder of CBC results also suggestive of corresponding hemoconcentration.  No clinical evidence to suggest underlying infectious process at this time, including screening nasopharyngeal COVID-19 PCR performed in the ED today was found to be negative.  Will check urinalysis to further evaluate for any underlying infectious process.  Does not meet criteria for sepsis.  Plan: Check  urinalysis.  Repeat CBC with differential in the morning.  IV fluids, as above.      #) GERD: On p.o. Protonix at home.  Plan: In the setting of current n.p.o. status, will hold home oral Protonix and initiate IV Protonix.      #) Essential hypertension: Outpatient hypertensive regimen consists of lisinopril as well as HCTZ.  Systolic blood pressures in the ED been noted to be normotensive.   Plan: We will antihypertensive medications for now in setting of current n.p.o. status.  Close monitoring of ensuing blood pressure via routine vital signs.     #) COPD: In the context of a [place] and current smoking history, the patient carries a diagnosis of COPD, with associated outpatient respiratory regimen noted to include Spiriva as well as as needed albuterol inhaler.  No clinical evidence to suggest acute exacerbation of underlying COPD at this time.  Plan: Continue home Spiriva as well as as needed albuterol inhaler.  Repeat CMP in the morning.  Add on serum magnesium level.      #) Allergic rhinitis: In the context of seasonal allergies, the patient conveys that she is on daily Allegra at home.  Plan: We will hold Allegra for now in the setting of current n.p.o. status.      #) Chronic tobacco abuse: The patient reports that she is a current smoker, having smoked approximately 1 pack/day for greater than 20 years.  Plan: Counseled the patient on the importance of complete smoking discontinuation, particularly the setting of a documented history of COPD.  I have placed an order for as needed nicotine patch for use during this hospitalization.     DVT prophylaxis: scd's  Code Status: Full code Family Communication: I discussed the patient's case with her son, who was present at bedside. Disposition Plan: Per Rounding Team;  Consults called: on-call general surgeon, Dr. Peyton Najjar, has been consulted, as further detailed above.  Admission status: Inpatient; MedSurg    Of  note, this patient was added by me to the following Admit List/Treatment Team: [facility].      PLEASE NOTE THAT  DRAGON DICTATION SOFTWARE WAS USED IN THE CONSTRUCTION OF THIS NOTE.   [facility] Pager [phone] From 12PM - 12AM  Otherwise, please contact night-coverage  [email or web address] Password [facility]   05/01/2020, 11:28 PM

## 2020-05-02 ENCOUNTER — Inpatient Hospital Stay: Payer: 59

## 2020-05-02 ENCOUNTER — Encounter: Payer: 59 | Admitting: Family Medicine

## 2020-05-02 DIAGNOSIS — K56609 Unspecified intestinal obstruction, unspecified as to partial versus complete obstruction: Secondary | ICD-10-CM | POA: Diagnosis not present

## 2020-05-02 LAB — COMPREHENSIVE METABOLIC PANEL
ALT: 17 U/L (ref 0–44)
AST: 16 U/L (ref 15–41)
Albumin: 3.5 g/dL (ref 3.5–5.0)
Alkaline Phosphatase: 131 U/L — ABNORMAL HIGH (ref 38–126)
Anion gap: 10 (ref 5–15)
BUN: 20 mg/dL (ref 6–20)
CO2: 28 mmol/L (ref 22–32)
Calcium: 8.8 mg/dL — ABNORMAL LOW (ref 8.9–10.3)
Chloride: 99 mmol/L (ref 98–111)
Creatinine, Ser: 0.72 mg/dL (ref 0.44–1.00)
GFR, Estimated: >60 mL/min (ref >60)
Glucose, Bld: 104 mg/dL — ABNORMAL HIGH (ref 70–99)
Potassium: 3.5 mmol/L (ref 3.5–5.1)
Sodium: 137 mmol/L (ref 135–145)
Total Bilirubin: 0.8 mg/dL (ref 0.3–1.2)
Total Protein: 6.4 g/dL — ABNORMAL LOW (ref 6.5–8.1)

## 2020-05-02 LAB — CBC WITH DIFFERENTIAL/PLATELET
Abs Immature Granulocytes: 0.04 10*3/uL (ref 0.00–0.07)
Basophils Absolute: 0 10*3/uL (ref 0.0–0.1)
Basophils Relative: 0 %
Eosinophils Absolute: 0.1 10*3/uL (ref 0.0–0.5)
Eosinophils Relative: 1 %
HCT: 41.9 % (ref 36.0–46.0)
Hemoglobin: 14.6 g/dL (ref 12.0–15.0)
Immature Granulocytes: 0 %
Lymphocytes Relative: 32 %
Lymphs Abs: 3.2 10*3/uL (ref 0.7–4.0)
MCH: 30.2 pg (ref 26.0–34.0)
MCHC: 34.8 g/dL (ref 30.0–36.0)
MCV: 86.6 fL (ref 80.0–100.0)
Monocytes Absolute: 0.8 10*3/uL (ref 0.1–1.0)
Monocytes Relative: 8 %
Neutro Abs: 6 10*3/uL (ref 1.7–7.7)
Neutrophils Relative %: 59 %
Platelets: 327 10*3/uL (ref 150–400)
RBC: 4.84 MIL/uL (ref 3.87–5.11)
RDW: 13.4 % (ref 11.5–15.5)
WBC: 10.1 10*3/uL (ref 4.0–10.5)
nRBC: 0 % (ref 0.0–0.2)

## 2020-05-02 LAB — MAGNESIUM: Magnesium: 2 mg/dL (ref 1.7–2.4)

## 2020-05-02 MED ORDER — SODIUM CHLORIDE 0.9 % IV BOLUS
1000.0000 mL | Freq: Once | INTRAVENOUS | Status: AC
  Administered 2020-05-02: 18:00:00 1000 mL via INTRAVENOUS

## 2020-05-02 MED ORDER — HYDROMORPHONE HCL 1 MG/ML IJ SOLN
0.5000 mg | INTRAMUSCULAR | Status: DC | PRN
  Administered 2020-05-02 – 2020-05-03 (×7): 1 mg via INTRAVENOUS
  Filled 2020-05-02 (×7): qty 1

## 2020-05-02 MED ORDER — KETOROLAC TROMETHAMINE 30 MG/ML IJ SOLN
30.0000 mg | Freq: Four times a day (QID) | INTRAMUSCULAR | Status: DC | PRN
  Administered 2020-05-02: 30 mg via INTRAVENOUS
  Filled 2020-05-02 (×2): qty 1

## 2020-05-02 MED ORDER — ENOXAPARIN SODIUM 40 MG/0.4ML ~~LOC~~ SOLN
40.0000 mg | SUBCUTANEOUS | Status: DC
  Administered 2020-05-02 – 2020-05-03 (×2): 40 mg via SUBCUTANEOUS
  Filled 2020-05-02 (×2): qty 0.4

## 2020-05-02 NOTE — Progress Notes (Signed)
PROGRESS NOTE    [name]  MVH:[id number] DOB: 30-May-1965 DOA: 05/01/2020 PCP: [name], PA-C    Brief Narrative:  55 year old female with history of COPD, chronic pain syndrome, hypertension, GERD presented to the ER with 2 days of abdominal pain, nausea and vomiting.  In the emergency room she was hemodynamically stable.  She was found to have partial small bowel obstruction.  Admitted with surgical consultation.   Assessment & Plan:   Principal Problem:   SBO (small bowel obstruction) (HCC) Active Problems:   GERD (gastroesophageal reflux disease)   Abdominal pain   Hypertension   Chronic obstructive pulmonary disease (HCC)   Tobacco use   Leukocytosis  Partial small bowel obstruction: N.p.o., NG tube, minimizing narcotics however not possible.  Mobilize. Serial abdominal exam. Today clinically improving, surgery to follow. Electrolytes are adequate and replaced.  Essential hypertension: Blood pressures are stable.  Will resume home medications when she is able to take by mouth.  Chronic pain syndrome: On oxycodone at home.  Will resume on discharge.  COPD: Stable.  Bronchodilators as needed.  GERD: On Protonix.  Continue.  IV while NPO.   DVT prophylaxis: enoxaparin (LOVENOX) injection 40 mg Start: 05/02/20 1030   Code Status: Full code Family Communication: None Disposition Plan: Status is: Inpatient  Remains inpatient appropriate because:IV treatments appropriate due to intensity of illness or inability to take PO   Dispo: The patient is from: Home              Anticipated d/c is to: Home              Patient currently is not medically stable to d/c.   Difficult to place patient No         Consultants:   Surgery  Procedures:   None  Antimicrobials:   None   Subjective: Patient seen and examined in the morning rounds.  She had no complaints.  Patient with flat affect and not very keen to conversation. She states she is passing  flatus.  She has minimum abdominal pain.  Denies any nausea vomiting.  NG tube initially drained 800 mL and since then not much drainage.  Objective: Vitals:   05/01/20 2055 05/01/20 2357 05/02/20 0518 05/02/20 0817  BP: 129/89 122/81 124/78 122/78  Pulse: 78 75 69 73  Resp: 18 16 16 15   Temp: 97.8 F (36.6 C) 97.7 F (36.5 C) 98 F (36.7 C) 97.6 F (36.4 C)  TempSrc: Oral Oral Oral Oral  SpO2: 98% 97% 96% 96%  Weight:      Height:        Intake/Output Summary (Last 24 hours) at 05/02/2020 1144 Last data filed at 05/02/2020 0620 Gross per 24 hour  Intake 1060.5 ml  Output 800 ml  Net 260.5 ml   Filed Weights   05/01/20 1419  Weight: 65.3 kg    Examination:  General exam: Appears calm and comfortable  Not in any distress. NG tube with light bile colored drainage. Anxious. Respiratory system: Clear to auscultation. Respiratory effort normal. Cardiovascular system: S1 & S2 heard, RRR. No JVD, murmurs, rubs, gallops or clicks. No pedal edema. Gastrointestinal system: Abdomen is soft.  Nontender.  Bowel sounds are present.   Extremities: Symmetric 5 x 5 power. Skin: No rashes, lesions or ulcers Psychiatry: Judgement and insight appear normal. Mood & affect flat and anxious.    Data Reviewed: I have personally reviewed following labs and imaging studies  CBC: Recent Labs  Lab 05/01/20 1424  05/02/20 0341  WBC 14.6* 10.1  NEUTROABS  --  6.0  HGB 16.8* 14.6  HCT 48.9* 41.9  MCV 86.2 86.6  PLT 415* 902   Basic Metabolic Panel: Recent Labs  Lab 05/01/20 1424 05/02/20 0341  NA 134* 137  K 3.8 3.5  CL 96* 99  CO2 24 28  GLUCOSE 119* 104*  BUN 19 20  CREATININE 0.81 0.72  CALCIUM 9.7 8.8*  MG 2.4 2.0   GFR: Estimated Creatinine Clearance: 82 mL/min (by C-G formula based on SCr of 0.72 mg/dL). Liver Function Tests: Recent Labs  Lab 05/01/20 1424 05/02/20 0341  AST 19 16  ALT 21 17  ALKPHOS 172* 131*  BILITOT 0.9 0.8  PROT 8.3* 6.4*  ALBUMIN 4.6 3.5    Recent Labs  Lab 05/01/20 1424  LIPASE 28   No results for input(s): AMMONIA in the last 168 hours. Coagulation Profile: No results for input(s): INR, PROTIME in the last 168 hours. Cardiac Enzymes: No results for input(s): CKTOTAL, CKMB, CKMBINDEX, TROPONINI in the last 168 hours. BNP (last 3 results) No results for input(s): PROBNP in the last 8760 hours. HbA1C: No results for input(s): HGBA1C in the last 72 hours. CBG: No results for input(s): GLUCAP in the last 168 hours. Lipid Profile: No results for input(s): CHOL, HDL, LDLCALC, TRIG, CHOLHDL, LDLDIRECT in the last 72 hours. Thyroid Function Tests: No results for input(s): TSH, T4TOTAL, FREET4, T3FREE, THYROIDAB in the last 72 hours. Anemia Panel: No results for input(s): VITAMINB12, FOLATE, FERRITIN, TIBC, IRON, RETICCTPCT in the last 72 hours. Sepsis Labs: No results for input(s): PROCALCITON, LATICACIDVEN in the last 168 hours.  Recent Results (from the past 240 hour(s))  Resp Panel by RT-PCR (Flu A&B, Covid) Nasopharyngeal Swab     Status: None   Collection Time: 05/01/20  2:24 PM   Specimen: Nasopharyngeal Swab; Nasopharyngeal(NP) swabs in vial transport medium  Result Value Ref Range Status   SARS Coronavirus 2 by RT PCR NEGATIVE NEGATIVE Final    Comment: (NOTE) SARS-CoV-2 target nucleic acids are NOT DETECTED.  The SARS-CoV-2 RNA is generally detectable in upper respiratory specimens during the acute phase of infection. The lowest concentration of SARS-CoV-2 viral copies this assay can detect is 138 copies/mL. A negative result does not preclude SARS-Cov-2 infection and should not be used as the sole basis for treatment or other patient management decisions. A negative result may occur with  improper specimen collection/handling, submission of specimen other than nasopharyngeal swab, presence of viral mutation(s) within the areas targeted by this assay, and inadequate number of viral copies(<138  copies/mL). A negative result must be combined with clinical observations, patient history, and epidemiological information. The expected result is Negative.  Fact Sheet for Patients:  EntrepreneurPulse.com.au  Fact Sheet for Healthcare Providers:  IncredibleEmployment.be  This test is no t yet approved or cleared by the [place] FDA and  has been authorized for detection and/or diagnosis of SARS-CoV-2 by FDA under an Emergency Use Authorization (EUA). This EUA will remain  in effect (meaning this test can be used) for the duration of the COVID-19 declaration under Section 564(b)(1) of the Act, 21 U.S.C.section 360bbb-3(b)(1), unless the authorization is terminated  or revoked sooner.       Influenza A by PCR NEGATIVE NEGATIVE Final   Influenza B by PCR NEGATIVE NEGATIVE Final    Comment: (NOTE) The Xpert Xpress SARS-CoV-2/FLU/RSV plus assay is intended as an aid in the diagnosis of influenza from Nasopharyngeal swab specimens and should not  be used as a sole basis for treatment. Nasal washings and aspirates are unacceptable for Xpert Xpress SARS-CoV-2/FLU/RSV testing.  Fact Sheet for Patients: EntrepreneurPulse.com.au  Fact Sheet for Healthcare Providers: IncredibleEmployment.be  This test is not yet approved or cleared by the [place] FDA and has been authorized for detection and/or diagnosis of SARS-CoV-2 by FDA under an Emergency Use Authorization (EUA). This EUA will remain in effect (meaning this test can be used) for the duration of the COVID-19 declaration under Section 564(b)(1) of the Act, 21 U.S.C. section 360bbb-3(b)(1), unless the authorization is terminated or revoked.  Performed at [facility], [address], [place], [place] [address]          Radiology Studies: CT ABDOMEN PELVIS WO CONTRAST  Result Date: 05/01/2020 CLINICAL DATA:  Nausea, vomiting and  diarrhea x2 days. EXAM: CT ABDOMEN AND PELVIS WITHOUT CONTRAST TECHNIQUE: Multidetector CT imaging of the abdomen and pelvis was performed following the standard protocol without IV contrast. COMPARISON:  January 30, 2019 FINDINGS: Lower chest: No acute abnormality. Hepatobiliary: No focal liver abnormality is seen. No gallstones, gallbladder wall thickening, or biliary dilatation. Pancreas: Unremarkable. No pancreatic ductal dilatation or surrounding inflammatory changes. Spleen: Normal in size without focal abnormality. Adrenals/Urinary Tract: Adrenal glands are unremarkable. Kidneys are normal, without renal calculi, focal lesion, or hydronephrosis. The urinary bladder is empty and subsequently limited in evaluation. Stomach/Bowel: Stomach is within normal limits. The appendix is surgically absent. Multiple mildly dilated small bowel loops are seen within the abdomen (maximum small bowel diameter of approximately 2.9 cm). A gradual transition zone is seen within the right lower quadrant (axial CT images 46 through 50, CT series number 2) Vascular/Lymphatic: Aortic atherosclerosis. No enlarged abdominal or pelvic lymph nodes. Reproductive: Uterus and bilateral adnexa are unremarkable. Other: No abdominal wall hernia or abnormality. No abdominopelvic ascites or free air. Musculoskeletal: Approximately 2 mm anterolisthesis of the L4 vertebral body is noted on L5. IMPRESSION: 1. Findings consistent with a partial small bowel obstruction. 2. Aortic atherosclerosis. Aortic Atherosclerosis (ICD10-I70.0). Electronically Signed   By: Virgina Norfolk M.D.   On: 05/01/2020 15:41   DG Abdomen 1 View  Result Date: 05/01/2020 CLINICAL DATA:  Check gastric catheter placement EXAM: ABDOMEN - 1 VIEW COMPARISON:  None. FINDINGS: Gastric catheter is noted extending into the stomach. No free air is seen. IMPRESSION: Gastric catheter within the stomach. Electronically Signed   By: Inez Catalina M.D.   On: 05/01/2020 16:57   DG  Abd 2 Views  Result Date: 05/02/2020 CLINICAL DATA:  Check gastric catheter placement EXAM: ABDOMEN - 2 VIEW COMPARISON:  05/01/2020 FINDINGS: Gastric catheter is noted within the stomach. Scattered colonic gas is seen without obstructive change. No free air is noted. Dilated small bowel seen on prior CT examination is not well appreciated on this exam. IMPRESSION: Previously seen partial small bowel obstruction not as well appreciated on this exam. Gastric catheter remains in the stomach. Electronically Signed   By: Inez Catalina M.D.   On: 05/02/2020 07:40        Scheduled Meds: . enoxaparin (LOVENOX) injection  40 mg Subcutaneous Q24H  . pantoprazole (PROTONIX) IV  40 mg Intravenous Q24H  . umeclidinium bromide  1 puff Inhalation Daily   Continuous Infusions: . lactated ringers 100 mL/hr at 05/02/20 0509     LOS: 1 day    Time spent: 30 minutes    Barb Merino, MD [facility] Pager [phone]

## 2020-05-02 NOTE — Progress Notes (Signed)
Patient to ultra sound in stable condition.

## 2020-05-02 NOTE — Progress Notes (Signed)
NGT returned to low-intermittent sx per surgery team, as patient states pain and nausea have increased.

## 2020-05-02 NOTE — Progress Notes (Signed)
Patient returned from x-ray in stable condition. NGT returned to low intermittent suction.

## 2020-05-02 NOTE — Progress Notes (Signed)
NGT clamped for 4 hour clamping trial per direction of Edison Simon, PA-C.

## 2020-05-02 NOTE — Progress Notes (Signed)
Patient to xray in stable condition.

## 2020-05-02 NOTE — Progress Notes (Signed)
[facility] SURGICAL PROGRESS NOTE (cpt [phone])  Hospital Day(s): 1.   Interval History: Patient seen and examined, no acute events or new complaints overnight. Patient reports she continues to have abdominal discomfort and some nausea. Complains of pain with NGT. She denies fever, chills, emesis. Labs are reassuring this morning, no leukocytosis, normal renal function, no electrolyte derangements. NGT output in the last 24 hours is 800 ccs. No reports of flatus.   Review of Systems:  Constitutional: denies fever, chills  HEENT: denies cough or congestion  Respiratory: denies any shortness of breath  Cardiovascular: denies chest pain or palpitations  Gastrointestinal: + abdominal pain, + nausea, denied any emesis  Genitourinary: denies burning with urination or urinary frequency  Vital signs in last 24 hours: [min-max] current  Temp:  [97.7 F (36.5 C)-98.1 F (36.7 C)] 98 F (36.7 C) (03/04 0518) Pulse Rate:  [69-116] 69 (03/04 0518) Resp:  [16-20] 16 (03/04 0518) BP: (112-129)/(78-95) 124/78 (03/04 0518) SpO2:  [95 %-99 %] 96 % (03/04 0518) Weight:  [65.3 kg] 65.3 kg (03/03 1419)     Height: 5\' 8"  (172.7 cm) Weight: 65.3 kg BMI (Calculated): 21.9   Intake/Output last 2 shifts:  03/03 0701 - 03/04 0700 In: 1060.5 [I.V.:1060.5] Out: 800 [Emesis/NG output:800]   Physical Exam:  Constitutional: alert, cooperative and no distress  HENT: normocephalic without obvious abnormality, NGT in place, appears rusty Eyes: PERRL, EOM's grossly intact and symmetric  Respiratory: breathing non-labored at rest  Cardiovascular: regular rate and sinus rhythm  Gastrointestinal: Soft, tenderness in the upper abdomen, non-distended, no rebound/guarding. Previous laparoscopic incisions are well healed Musculoskeletal: No edema or wounds, motor and sensation grossly intact, NT    Labs:  CBC Latest Ref Rng & Units 05/02/2020 05/01/2020 01/17/2020  WBC 4.0 - 10.5 K/uL 10.1 14.6(H) 9.9   Hemoglobin 12.0 - 15.0 g/dL 14.6 16.8(H) 15.8(H)  Hematocrit 36.0 - 46.0 % 41.9 48.9(H) 46.2(H)  Platelets 150 - 400 K/uL 327 415(H) 392   CMP Latest Ref Rng & Units 05/02/2020 05/01/2020 03/20/2020  Glucose 70 - 99 mg/dL 104(H) 119(H) 90  BUN 6 - 20 mg/dL 20 19 10   Creatinine 0.44 - 1.00 mg/dL 0.72 0.81 0.96  Sodium 135 - 145 mmol/L 137 134(L) 143  Potassium 3.5 - 5.1 mmol/L 3.5 3.8 4.5  Chloride 98 - 111 mmol/L 99 96(L) 101  CO2 22 - 32 mmol/L 28 24 26   Calcium 8.9 - 10.3 mg/dL 8.8(L) 9.7 9.7  Total Protein 6.5 - 8.1 g/dL 6.4(L) 8.3(H) 6.9  Total Bilirubin 0.3 - 1.2 mg/dL 0.8 0.9 <0.2  Alkaline Phos 38 - 126 U/L 131(H) 172(H) 179(H)  AST 15 - 41 U/L 16 19 14   ALT 0 - 44 U/L 17 21 15     Imaging studies:   KUB (05/02/2020) personally reviewed showing significant colonic gas, small bowel distension improved, and radiologist report reviewed:  IMPRESSION: Previously seen partial small bowel obstruction not as well appreciated on this exam. Gastric catheter remains in the stomach.   Assessment/Plan: (ICD-10's: K18.609) 55 y.o. 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    - Continue NGT decompression; LIS; monitor and record output --> once she starts to pass flatus we can proceed with clamping trial   - NPO + IVF Resuscitation  - Monitor abdominal examination; on-going bowel function  - Pain control prn; antiemetics prn  - No emergent interventions; she understands that if she were to fail to resolve with conservative measure  we would need to consider surgical intervention  - Serial KUBs as needed  - Mobilization encouraged   - Further management per primary service; we will follow    All of the above findings and recommendations were discussed with the patient, and the medical team, and all of patient's questions were answered to er expressed satisfaction.  -- Edison Simon, PA-C [facility] 05/02/2020, 7:06 AM [phone] M-F: 7am - 4pm

## 2020-05-02 NOTE — Progress Notes (Signed)
NGT + placement via auscultation. Mucousy drainage noted. Irrigated with 30cc of water. Placed on ILWS.

## 2020-05-02 NOTE — Plan of Care (Signed)

## 2020-05-03 DIAGNOSIS — K56609 Unspecified intestinal obstruction, unspecified as to partial versus complete obstruction: Secondary | ICD-10-CM | POA: Diagnosis not present

## 2020-05-03 MED ORDER — HYDROMORPHONE HCL 1 MG/ML IJ SOLN
0.5000 mg | INTRAMUSCULAR | Status: DC | PRN
  Administered 2020-05-03: 13:00:00 0.5 mg via INTRAVENOUS
  Filled 2020-05-03: qty 1

## 2020-05-03 MED ORDER — HYDROMORPHONE HCL 1 MG/ML IJ SOLN
0.5000 mg | INTRAMUSCULAR | Status: DC | PRN
  Administered 2020-05-03 – 2020-05-10 (×47): 0.5 mg via INTRAVENOUS
  Filled 2020-05-03 (×49): qty 1

## 2020-05-03 MED ORDER — LISINOPRIL 10 MG PO TABS
10.0000 mg | ORAL_TABLET | Freq: Every day | ORAL | Status: DC
  Administered 2020-05-03 – 2020-05-10 (×8): 10 mg via ORAL
  Filled 2020-05-03 (×9): qty 1

## 2020-05-03 NOTE — Plan of Care (Signed)

## 2020-05-03 NOTE — Progress Notes (Signed)
PROGRESS NOTE    [name]  NFA:[id number] DOB: 05/05/1965 DOA: 05/01/2020 PCP: [name], PA-C    Brief Narrative:  55 year old female with history of COPD, chronic pain syndrome, hypertension, GERD presented to the ER with 2 days of abdominal pain, nausea and vomiting.  In the emergency room she was hemodynamically stable.  She was found to have partial small bowel obstruction.  Admitted with surgical consultation. Patient does have history of traumatic brain injury and significant debility.   Assessment & Plan:   Principal Problem:   SBO (small bowel obstruction) (HCC) Active Problems:   GERD (gastroesophageal reflux disease)   Abdominal pain   Hypertension   Chronic obstructive pulmonary disease (HCC)   Tobacco use   Leukocytosis  Partial small bowel obstruction: N.p.o., NG tube, minimizing narcotics however not possible.  Mobilize. Will decrease frequency of Dilaudid. Serial abdominal exam. Electrolytes are adequate and replaced. Unsuccessful clamping trial yesterday, will follow surgical recommendation.  Essential hypertension: Blood pressures are stable.  Will resume home medications when she is able to take by mouth.  Chronic pain syndrome: On oxycodone at home.  Will resume on discharge.  COPD: Stable.  Bronchodilators as needed.  GERD: On Protonix.  Continue.  IV while NPO.   DVT prophylaxis: enoxaparin (LOVENOX) injection 40 mg Start: 05/02/20 1030   Code Status: Full code Family Communication: Patient's sister on the phone. Disposition Plan: Status is: Inpatient  Remains inpatient appropriate because:IV treatments appropriate due to intensity of illness or inability to take PO   Dispo: The patient is from: Home              Anticipated d/c is to: Home              Patient currently is not medically stable to d/c.   Difficult to place patient No         Consultants:   Surgery  Procedures:   None  Antimicrobials:    None   Subjective: Patient seen and examined.  She is not very forthcoming with the conversation.  She just tells me that she is hurting a lot.  Had just received Dilaudid. Denies any flatus or bowel movement.  Denies any vomiting.  Objective: Vitals:   05/02/20 2134 05/03/20 0133 05/03/20 0457 05/03/20 0819  BP: 131/88 (!) 160/91 130/85 (!) 145/86  Pulse: 84 98 95 90  Resp: 15 20 16 20   Temp: 98.1 F (36.7 C) 99.1 F (37.3 C) 98.3 F (36.8 C) 98.4 F (36.9 C)  TempSrc:  Oral Oral   SpO2: 97% 95% 94% 97%  Weight:      Height:        Intake/Output Summary (Last 24 hours) at 05/03/2020 1159 Last data filed at 05/03/2020 0900 Gross per 24 hour  Intake 2885.82 ml  Output 1820 ml  Net 1065.82 ml   Filed Weights   05/01/20 1419  Weight: 65.3 kg    Examination:  General exam: Appears calm and comfortable  Poor historian.  Flat affect.  Anxious. Not in any distress. NG tube with light bile colored drainage. Respiratory system: Clear to auscultation. Respiratory effort normal. Cardiovascular system: S1 & S2 heard, RRR. No JVD, murmurs, rubs, gallops or clicks. No pedal edema. Gastrointestinal system: Abdomen is soft.  Nontender on palpation.  Bowel sounds are present.   Extremities: Symmetric 5 x 5 power. Skin: No rashes, lesions or ulcers Psychiatry: Judgement and insight appear normal. Mood & affect flat and anxious.    Data Reviewed:  I have personally reviewed following labs and imaging studies  CBC: Recent Labs  Lab 05/01/20 1424 05/02/20 0341  WBC 14.6* 10.1  NEUTROABS  --  6.0  HGB 16.8* 14.6  HCT 48.9* 41.9  MCV 86.2 86.6  PLT 415* 664   Basic Metabolic Panel: Recent Labs  Lab 05/01/20 1424 05/02/20 0341  NA 134* 137  K 3.8 3.5  CL 96* 99  CO2 24 28  GLUCOSE 119* 104*  BUN 19 20  CREATININE 0.81 0.72  CALCIUM 9.7 8.8*  MG 2.4 2.0   GFR: Estimated Creatinine Clearance: 82 mL/min (by C-G formula based on SCr of 0.72 mg/dL). Liver Function  Tests: Recent Labs  Lab 05/01/20 1424 05/02/20 0341  AST 19 16  ALT 21 17  ALKPHOS 172* 131*  BILITOT 0.9 0.8  PROT 8.3* 6.4*  ALBUMIN 4.6 3.5   Recent Labs  Lab 05/01/20 1424  LIPASE 28   No results for input(s): AMMONIA in the last 168 hours. Coagulation Profile: No results for input(s): INR, PROTIME in the last 168 hours. Cardiac Enzymes: No results for input(s): CKTOTAL, CKMB, CKMBINDEX, TROPONINI in the last 168 hours. BNP (last 3 results) No results for input(s): PROBNP in the last 8760 hours. HbA1C: No results for input(s): HGBA1C in the last 72 hours. CBG: No results for input(s): GLUCAP in the last 168 hours. Lipid Profile: No results for input(s): CHOL, HDL, LDLCALC, TRIG, CHOLHDL, LDLDIRECT in the last 72 hours. Thyroid Function Tests: No results for input(s): TSH, T4TOTAL, FREET4, T3FREE, THYROIDAB in the last 72 hours. Anemia Panel: No results for input(s): VITAMINB12, FOLATE, FERRITIN, TIBC, IRON, RETICCTPCT in the last 72 hours. Sepsis Labs: No results for input(s): PROCALCITON, LATICACIDVEN in the last 168 hours.  Recent Results (from the past 240 hour(s))  Resp Panel by RT-PCR (Flu A&B, Covid) Nasopharyngeal Swab     Status: None   Collection Time: 05/01/20  2:24 PM   Specimen: Nasopharyngeal Swab; Nasopharyngeal(NP) swabs in vial transport medium  Result Value Ref Range Status   SARS Coronavirus 2 by RT PCR NEGATIVE NEGATIVE Final    Comment: (NOTE) SARS-CoV-2 target nucleic acids are NOT DETECTED.  The SARS-CoV-2 RNA is generally detectable in upper respiratory specimens during the acute phase of infection. The lowest concentration of SARS-CoV-2 viral copies this assay can detect is 138 copies/mL. A negative result does not preclude SARS-Cov-2 infection and should not be used as the sole basis for treatment or other patient management decisions. A negative result may occur with  improper specimen collection/handling, submission of specimen  other than nasopharyngeal swab, presence of viral mutation(s) within the areas targeted by this assay, and inadequate number of viral copies(<138 copies/mL). A negative result must be combined with clinical observations, patient history, and epidemiological information. The expected result is Negative.  Fact Sheet for Patients:  EntrepreneurPulse.com.au  Fact Sheet for Healthcare Providers:  IncredibleEmployment.be  This test is no t yet approved or cleared by the [place] FDA and  has been authorized for detection and/or diagnosis of SARS-CoV-2 by FDA under an Emergency Use Authorization (EUA). This EUA will remain  in effect (meaning this test can be used) for the duration of the COVID-19 declaration under Section 564(b)(1) of the Act, 21 U.S.C.section 360bbb-3(b)(1), unless the authorization is terminated  or revoked sooner.       Influenza A by PCR NEGATIVE NEGATIVE Final   Influenza B by PCR NEGATIVE NEGATIVE Final    Comment: (NOTE) The Xpert Xpress SARS-CoV-2/FLU/RSV plus assay  is intended as an aid in the diagnosis of influenza from Nasopharyngeal swab specimens and should not be used as a sole basis for treatment. Nasal washings and aspirates are unacceptable for Xpert Xpress SARS-CoV-2/FLU/RSV testing.  Fact Sheet for Patients: EntrepreneurPulse.com.au  Fact Sheet for Healthcare Providers: IncredibleEmployment.be  This test is not yet approved or cleared by the [place] FDA and has been authorized for detection and/or diagnosis of SARS-CoV-2 by FDA under an Emergency Use Authorization (EUA). This EUA will remain in effect (meaning this test can be used) for the duration of the COVID-19 declaration under Section 564(b)(1) of the Act, 21 U.S.C. section 360bbb-3(b)(1), unless the authorization is terminated or revoked.  Performed at [facility], [address]  [place], [place] [address]          Radiology Studies: CT ABDOMEN PELVIS WO CONTRAST  Result Date: 05/01/2020 CLINICAL DATA:  Nausea, vomiting and diarrhea x2 days. EXAM: CT ABDOMEN AND PELVIS WITHOUT CONTRAST TECHNIQUE: Multidetector CT imaging of the abdomen and pelvis was performed following the standard protocol without IV contrast. COMPARISON:  January 30, 2019 FINDINGS: Lower chest: No acute abnormality. Hepatobiliary: No focal liver abnormality is seen. No gallstones, gallbladder wall thickening, or biliary dilatation. Pancreas: Unremarkable. No pancreatic ductal dilatation or surrounding inflammatory changes. Spleen: Normal in size without focal abnormality. Adrenals/Urinary Tract: Adrenal glands are unremarkable. Kidneys are normal, without renal calculi, focal lesion, or hydronephrosis. The urinary bladder is empty and subsequently limited in evaluation. Stomach/Bowel: Stomach is within normal limits. The appendix is surgically absent. Multiple mildly dilated small bowel loops are seen within the abdomen (maximum small bowel diameter of approximately 2.9 cm). A gradual transition zone is seen within the right lower quadrant (axial CT images 46 through 50, CT series number 2) Vascular/Lymphatic: Aortic atherosclerosis. No enlarged abdominal or pelvic lymph nodes. Reproductive: Uterus and bilateral adnexa are unremarkable. Other: No abdominal wall hernia or abnormality. No abdominopelvic ascites or free air. Musculoskeletal: Approximately 2 mm anterolisthesis of the L4 vertebral body is noted on L5. IMPRESSION: 1. Findings consistent with a partial small bowel obstruction. 2. Aortic atherosclerosis. Aortic Atherosclerosis (ICD10-I70.0). Electronically Signed   By: Virgina Norfolk M.D.   On: 05/01/2020 15:41   DG Abdomen 1 View  Result Date: 05/01/2020 CLINICAL DATA:  Check gastric catheter placement EXAM: ABDOMEN - 1 VIEW COMPARISON:  None. FINDINGS: Gastric catheter is noted extending into the  stomach. No free air is seen. IMPRESSION: Gastric catheter within the stomach. Electronically Signed   By: Inez Catalina M.D.   On: 05/01/2020 16:57   DG Abd 2 Views  Result Date: 05/02/2020 CLINICAL DATA:  Check gastric catheter placement EXAM: ABDOMEN - 2 VIEW COMPARISON:  05/01/2020 FINDINGS: Gastric catheter is noted within the stomach. Scattered colonic gas is seen without obstructive change. No free air is noted. Dilated small bowel seen on prior CT examination is not well appreciated on this exam. IMPRESSION: Previously seen partial small bowel obstruction not as well appreciated on this exam. Gastric catheter remains in the stomach. Electronically Signed   By: Inez Catalina M.D.   On: 05/02/2020 07:40   US Abdomen Limited RUQ (LIVER/GB)  Result Date: 05/02/2020 CLINICAL DATA:  Right upper quadrant tenderness over the last 2 days. EXAM: ULTRASOUND ABDOMEN LIMITED RIGHT UPPER QUADRANT COMPARISON:  CT 05/01/2020 FINDINGS: Gallbladder: No gallstones or wall thickening visualized. No sonographic Murphy sign noted by sonographer. Common bile duct: Diameter: 3 mm, normal Liver: No focal lesion identified. Within normal limits in parenchymal  echogenicity. Portal vein is patent on color Doppler imaging with normal direction of blood flow towards the liver. Other: None. IMPRESSION: Normal right upper quadrant ultrasound. Electronically Signed   By: Nelson Chimes M.D.   On: 05/02/2020 15:26        Scheduled Meds: . enoxaparin (LOVENOX) injection  40 mg Subcutaneous Q24H  . pantoprazole (PROTONIX) IV  40 mg Intravenous Q24H  . umeclidinium bromide  1 puff Inhalation Daily   Continuous Infusions: . lactated ringers 100 mL/hr at 05/03/20 0457     LOS: 2 days    Time spent: 30 minutes    Barb Merino, MD [facility] Pager [phone]

## 2020-05-03 NOTE — Progress Notes (Signed)
Subjective:  CC: [name] is a 55 y.o. female  Hospital stay day 2,   SBO  HPI: No acute changes overnight. Passing flatus, still having abd pain  ROS:  General: Denies weight loss, weight gain, fatigue, fevers, chills, and night sweats. Heart: Denies chest pain, palpitations, racing heart, irregular heartbeat, leg pain or swelling, and decreased activity tolerance. Respiratory: Denies breathing difficulty, shortness of breath, wheezing, cough, and sputum. GI: Denies change in appetite, heartburn, nausea, vomiting, constipation, diarrhea, and blood in stool. GU: Denies difficulty urinating, pain with urinating, urgency, frequency, blood in urine.   Objective:   Temp:  [97.9 F (36.6 C)-99.1 F (37.3 C)] 97.9 F (36.6 C) (03/05 1606) Pulse Rate:  [84-98] 90 (03/05 1606) Resp:  [15-20] 18 (03/05 1606) BP: (130-165)/(85-91) 165/85 (03/05 1606) SpO2:  [94 %-99 %] 98 % (03/05 1606)     Height: 5\' 8"  (172.7 cm) Weight: 65.3 kg BMI (Calculated): 21.9   Intake/Output this shift:   Intake/Output Summary (Last 24 hours) at 05/03/2020 1816 Last data filed at 05/03/2020 1300 Gross per 24 hour  Intake 2885.82 ml  Output 1820 ml  Net 1065.82 ml  NG with dark maroon output  Constitutional :  alert, cooperative, appears stated age and no distress  Respiratory:  clear to auscultation bilaterally  Cardiovascular:  regular rate and rhythm  Gastrointestinal: soft, but TTP in epigastric area with slight touch..   Skin: Cool and moist.   Psychiatric: Normal affect, non-agitated, not confused       LABS:  CMP Latest Ref Rng & Units 05/02/2020 05/01/2020 03/20/2020  Glucose 70 - 99 mg/dL 104(H) 119(H) 90  BUN 6 - 20 mg/dL 20 19 10   Creatinine 0.44 - 1.00 mg/dL 0.72 0.81 0.96  Sodium 135 - 145 mmol/L 137 134(L) 143  Potassium 3.5 - 5.1 mmol/L 3.5 3.8 4.5  Chloride 98 - 111 mmol/L 99 96(L) 101  CO2 22 - 32 mmol/L 28 24 26   Calcium 8.9 - 10.3 mg/dL 8.8(L) 9.7 9.7  Total Protein 6.5 - 8.1 g/dL  6.4(L) 8.3(H) 6.9  Total Bilirubin 0.3 - 1.2 mg/dL 0.8 0.9 <0.2  Alkaline Phos 38 - 126 U/L 131(H) 172(H) 179(H)  AST 15 - 41 U/L 16 19 14   ALT 0 - 44 U/L 17 21 15    CBC Latest Ref Rng & Units 05/02/2020 05/01/2020 01/17/2020  WBC 4.0 - 10.5 K/uL 10.1 14.6(H) 9.9  Hemoglobin 12.0 - 15.0 g/dL 14.6 16.8(H) 15.8(H)  Hematocrit 36.0 - 46.0 % 41.9 48.9(H) 46.2(H)  Platelets 150 - 400 K/uL 327 415(H) 392    RADS: CLINICAL DATA:  Check gastric catheter placement  EXAM: ABDOMEN - 2 VIEW  COMPARISON:  05/01/2020  FINDINGS: Gastric catheter is noted within the stomach. Scattered colonic gas is seen without obstructive change. No free air is noted. Dilated small bowel seen on prior CT examination is not well appreciated on this exam.  IMPRESSION: Previously seen partial small bowel obstruction not as well appreciated on this exam. Gastric catheter remains in the stomach.   Electronically Signed   By: Inez Catalina M.D.   On: 05/02/2020 07:40 Assessment:   Partial SBO.  TTP in epigastric region unexpected, with associated severe anxiety when trying to repeat exam.  Labs and imaging otherwise reassuring.  Will continue NG decompression and monitor.  Hold lovenox and toradol for maroon colored NG output.  Continue protonix. Recheck hgb in am

## 2020-05-04 ENCOUNTER — Inpatient Hospital Stay: Payer: 59

## 2020-05-04 DIAGNOSIS — R112 Nausea with vomiting, unspecified: Secondary | ICD-10-CM | POA: Diagnosis not present

## 2020-05-04 DIAGNOSIS — R1010 Upper abdominal pain, unspecified: Secondary | ICD-10-CM

## 2020-05-04 DIAGNOSIS — K56609 Unspecified intestinal obstruction, unspecified as to partial versus complete obstruction: Secondary | ICD-10-CM | POA: Diagnosis not present

## 2020-05-04 LAB — COMPREHENSIVE METABOLIC PANEL
ALT: 12 U/L (ref 0–44)
AST: 13 U/L — ABNORMAL LOW (ref 15–41)
Albumin: 3.3 g/dL — ABNORMAL LOW (ref 3.5–5.0)
Alkaline Phosphatase: 110 U/L (ref 38–126)
Anion gap: 13 (ref 5–15)
BUN: 11 mg/dL (ref 6–20)
CO2: 28 mmol/L (ref 22–32)
Calcium: 8.9 mg/dL (ref 8.9–10.3)
Chloride: 96 mmol/L — ABNORMAL LOW (ref 98–111)
Creatinine, Ser: 0.75 mg/dL (ref 0.44–1.00)
GFR, Estimated: >60 mL/min (ref >60)
Glucose, Bld: 89 mg/dL (ref 70–99)
Potassium: 3.9 mmol/L (ref 3.5–5.1)
Sodium: 137 mmol/L (ref 135–145)
Total Bilirubin: 1.1 mg/dL (ref 0.3–1.2)
Total Protein: 6.5 g/dL (ref 6.5–8.1)

## 2020-05-04 LAB — CBC WITH DIFFERENTIAL/PLATELET
Abs Immature Granulocytes: 0.05 10*3/uL (ref 0.00–0.07)
Basophils Absolute: 0 10*3/uL (ref 0.0–0.1)
Basophils Relative: 0 %
Eosinophils Absolute: 0 10*3/uL (ref 0.0–0.5)
Eosinophils Relative: 0 %
HCT: 39.3 % (ref 36.0–46.0)
Hemoglobin: 13.2 g/dL (ref 12.0–15.0)
Immature Granulocytes: 0 %
Lymphocytes Relative: 14 %
Lymphs Abs: 1.7 10*3/uL (ref 0.7–4.0)
MCH: 29.7 pg (ref 26.0–34.0)
MCHC: 33.6 g/dL (ref 30.0–36.0)
MCV: 88.3 fL (ref 80.0–100.0)
Monocytes Absolute: 0.9 10*3/uL (ref 0.1–1.0)
Monocytes Relative: 8 %
Neutro Abs: 9.4 10*3/uL — ABNORMAL HIGH (ref 1.7–7.7)
Neutrophils Relative %: 78 %
Platelets: 297 10*3/uL (ref 150–400)
RBC: 4.45 MIL/uL (ref 3.87–5.11)
RDW: 12.8 % (ref 11.5–15.5)
WBC: 12.1 10*3/uL — ABNORMAL HIGH (ref 4.0–10.5)
nRBC: 0 % (ref 0.0–0.2)

## 2020-05-04 LAB — MAGNESIUM: Magnesium: 1.8 mg/dL (ref 1.7–2.4)

## 2020-05-04 LAB — PHOSPHORUS: Phosphorus: 3 mg/dL (ref 2.5–4.6)

## 2020-05-04 MED ORDER — ENALAPRILAT 1.25 MG/ML IV SOLN
0.6250 mg | Freq: Four times a day (QID) | INTRAVENOUS | Status: DC | PRN
  Administered 2020-05-04 – 2020-05-08 (×3): 0.625 mg via INTRAVENOUS
  Filled 2020-05-04 (×4): qty 0.5

## 2020-05-04 MED ORDER — MAGNESIUM SULFATE 2 GM/50ML IV SOLN
2.0000 g | Freq: Once | INTRAVENOUS | Status: AC
  Administered 2020-05-04: 2 g via INTRAVENOUS
  Filled 2020-05-04: qty 50

## 2020-05-04 MED ORDER — PANTOPRAZOLE SODIUM 40 MG IV SOLR
40.0000 mg | Freq: Two times a day (BID) | INTRAVENOUS | Status: DC
  Administered 2020-05-04 – 2020-05-07 (×6): 40 mg via INTRAVENOUS
  Filled 2020-05-04 (×6): qty 40

## 2020-05-04 NOTE — Progress Notes (Signed)
Patient with complaints of chest pain. 7/10. Describes as a tightness. EKG done. See MAR for medications administered. Jonny Ruiz NP Notified. Will continue to monitor patient.

## 2020-05-04 NOTE — Consult Note (Addendum)
Becky Antigua, MD [address] [place], [place], [place], [address] [address], [place], [place], [place], [address] Phone: [phone]  Fax: [phone]  Consultation  Referring Provider:     Dr. Sloan Leiter Primary Care Physician:  [name], PA-C Reason for Consultation:     Abdominal pain Primary gastroenterologist: Dr. Vicente Males  Date of Admission:  05/01/2020 Date of Consultation:  05/04/2020         HPI:   [name] is a 55 y.o. female presented to the hospital 3 days ago with abdominal pain.  Epigastric, sharp, nonradiating, exacerbating with palpation, intermittent nausea, with associated vomiting for 1 to 2 days prior to presentation.  On my evaluation today, patient is NG tube is not in place and was previously clamped and patient has not been having any further nausea vomiting but does have epigastric pain.  CT on presentation showed partial small bowel obstruction and NG was placed, with improvement in symptoms.  Patient is on chronic opioids  She was recently seen by gastroenterologist, Dr. Vicente Males in January 2022 for abdominal pain, early satiety and as per his notes, "GI symptoms are suggestive of gastroparesis likely secondary to use of oxycodone".  Plan was for EGD to rule out gastric outlet obstruction and colonoscopy for screening.  Gastric emptying study and labs were also planned.  EGD, colonoscopy, and gastric emptying study have not been done yet  Patient has history of small bowel obstruction in November 2020 when she presented with abdominal pain and vomiting.  Symptoms did not improve with conservative management and patient was taken to the OR and underwent laparoscopic appendectomy with lysis of distal ileum adhesion and improved postoperatively.  Past Medical History:  Diagnosis Date   BRBPR (bright red blood per rectum)    Breast mass, left    Cancer (HCC)    CERVICAL   Depression    Falls    EASILY   GERD (gastroesophageal reflux disease)     PTSD (post-traumatic stress disorder)    PTSD (post-traumatic stress disorder)    Seizures (HCC)    Shoulder disorder    LIMITED USE OF LEFT SHOULDER AND ARM   TBI (traumatic brain injury) ([place])    SKULL FX 2009   Vertigo    Vertigo     Past Surgical History:  Procedure Laterality Date   abd pain     APPENDECTOMY     BACK SURGERY     LUMBAR FUSIONS X 2   CATARACT EXTRACTION W/PHACO Right 01/07/2015   Procedure: CATARACT EXTRACTION PHACO AND INTRAOCULAR LENS PLACEMENT ([place]);  Surgeon: Birder Robson, MD;  Location: [facility] ORS;  Service: Ophthalmology;  Laterality: Right;  [place] 0 AP% 0 CDE 0 fluid pack lot #[id number]   CATARACT EXTRACTION W/PHACO Left 01/28/2015   Procedure: CATARACT EXTRACTION PHACO AND INTRAOCULAR LENS PLACEMENT ([facility]);  Surgeon: Birder Robson, MD;  Location: [facility] ORS;  Service: Ophthalmology;  Laterality: Left;  [place] 00:18 AP%7.1 CDE1.32 fluid pack lot # [id number]   Cerebral Catheterization for Brain Bleed  02/02/2019   [facility]   LEEP     XI ROBOT ABDOMINAL PERINEAL RESECTION N/A 01/24/2019   Procedure: Robot assisted appendectomy ;  Surgeon: Ronny Bacon, MD;  Location: [facility] ORS;  Service: General;  Laterality: N/A;    Prior to Admission medications   Medication Sig Start Date End Date Taking? Authorizing Provider  Tiotropium Bromide Monohydrate (SPIRIVA RESPIMAT) 2.5 MCG/ACT AERS Inhale 2 puffs into the lungs daily. 06/14/19  Yes Lucio Edward,  [place], PA-C  albuterol (VENTOLIN HFA) 108 (90 Base) MCG/ACT inhaler INL 2 PFS PO Q 4 TO 6 H IF NEEDED 06/14/19   [name], PA-C  butalbital-acetaminophen-caffeine (FIORICET) [phone] MG tablet Take 1 tablet by mouth every 6 (six) hours as needed for headache. 03/26/19   [name], PA-C  carisoprodol (SOMA) 350 MG tablet Take 700 mg by mouth 2 (two) times daily.     [provider]  Docusate Sodium (DSS) 100 MG CAPS TK ONE C PO BID PRF MILD CONSTIPATION 01/28/19   [provider]   EMGALITY 120 MG/ML SOAJ Inject 1 mL into the muscle every 28 (twenty-eight) days. 04/24/20   [provider]  fexofenadine (ALLEGRA) 180 MG tablet Take 180 mg by mouth daily.     [provider]  lisinopril-hydrochlorothiazide (ZESTORETIC) 10-12.5 MG tablet TAKE 1 TABLET BY MOUTH EVERY DAY 04/30/20   [name], PA-C  oxyCODONE (OXY IR/ROXICODONE) 5 MG immediate release tablet Take 5 mg by mouth 2 (two) times daily. 02/19/19   [provider]  pantoprazole (PROTONIX) 40 MG tablet Take 1 tablet (40 mg total) by mouth 2 (two) times daily. 07/16/19   [name], PA-C    Family History  Problem Relation Age of Onset   Ovarian cancer Maternal Aunt        39's   Depression Mother    Heart disease Father    Hypertension Father    Diabetes Father    Post-traumatic stress disorder Sister    Hypertension Sister    Diabetes Brother    Depression Brother    Hypertension Brother      Social History   Tobacco Use   Smoking status: Current Every Day Smoker    Packs/day: 1.00    Types: Cigarettes   Smokeless tobacco: Never Used  Vaping Use   Vaping Use: Every day   Substances: CBD  Substance Use Topics   Alcohol use: No   Drug use: Yes    Types: Marijuana    Allergies as of 05/01/2020 - Review Complete 05/01/2020  Allergen Reaction Noted   Contrast media [iodinated diagnostic agents]  01/08/2016   Diphenhydramine hcl Hives and Itching 04/15/2015   Adhesive [tape]  01/01/2015   Iodine Other (See Comments) 05/05/2016   Prednisone Anxiety 07/27/2017   Sulfa antibiotics Rash 07/04/2012    Review of Systems:    All systems reviewed and negative except where noted in HPI.   Physical Exam:  Vital signs in last 24 hours: Vitals:   05/04/20 0802 05/04/20 1209 05/04/20 1414 05/04/20 1559  BP: (!) 153/92 (!) 166/103 (!) 158/95 (!) 150/91  Pulse: 79 85 86 82  Resp: 18 16  16   Temp: 97.9 F (36.6 C) 98.5 F (36.9 C)  99.1 F (37.3 C)   TempSrc: Oral Oral    SpO2: 97% 97% 98% 96%  Weight:      Height:       Last BM Date: 05/01/20 General:   Pleasant, cooperative in NAD Head:  Normocephalic and atraumatic. Eyes:   No icterus.   Conjunctiva pink. PERRLA. Ears:  Normal auditory acuity. Neck:  Supple; no masses or thyroidomegaly Lungs: Respirations even and unlabored. Lungs clear to auscultation bilaterally.   No wheezes, crackles, or rhonchi.  Abdomen:  Soft, nondistended, nontender. Normal bowel sounds. No appreciable masses or hepatomegaly.  No rebound or guarding.  Neurologic:  Alert and oriented x3;  grossly normal neurologically. Skin:  Intact without significant lesions or rashes. Cervical Nodes:  No significant cervical adenopathy. Psych:  Alert and cooperative. Normal affect.  LAB RESULTS: Recent Labs    05/02/20 0341 05/04/20 0446  WBC 10.1 12.1*  HGB 14.6 13.2  HCT 41.9 39.3  PLT 327 297   BMET Recent Labs    05/02/20 0341 05/04/20 0446  NA 137 137  K 3.5 3.9  CL 99 96*  CO2 28 28  GLUCOSE 104* 89  BUN 20 11  CREATININE 0.72 0.75  CALCIUM 8.8* 8.9   LFT Recent Labs    05/04/20 0446  PROT 6.5  ALBUMIN 3.3*  AST 13*  ALT 12  ALKPHOS 110  BILITOT 1.1   PT/INR No results for input(s): LABPROT, INR in the last 72 hours.  STUDIES: DG Abd 1 View  Result Date: 05/04/2020 CLINICAL DATA:  Nasogastric tube placement EXAM: ABDOMEN - 1 VIEW COMPARISON:  05/02/2020 FINDINGS: Nasogastric tube tip overlies the expected proximal to mid body of the stomach. The visualized abdominal gas pattern is unremarkable. Lung bases are clear. IMPRESSION: Nasogastric tube tip within the proximal to mid body of the stomach. Electronically Signed   By: Fidela Salisbury MD   On: 05/04/2020 07:02      Impression / Plan:   TILLY PERNICE is a 55 y.o. y/o female with admission for abdominal pain, nausea vomiting with imaging showing partial SBO, with symptoms now resolving, with previous history of chronic symptoms  evaluated by gastroenterologist as an outpatient and suggestive of possible underlying gastroparesis from chronic opioid use  Patient's NG tube has been discontinued and she has not had for any further nausea vomiting today  If symptoms continue to improve, would recommend outpatient EGD and colonoscopy as previously planned by outpatient gastroenterologist, Dr. Vicente Males, especially given recent partial SBO  However, if symptoms continue despite conservative measures, HD can be considered as an inpatient  Patient was on Toradol, and I will increase her Protonix from every 24 hours to every 12 hours  Reassess symptoms on increased dose and if symptoms continue, consider endoscopy  Continue to minimize opioids as possile  Dr. Marius Ditch to follow from tomorrow  No alarm symptoms present to indicate urgent endoscopy at this time  Thank you for involving me in the care of this patient.      LOS: 3 days   Virgel Manifold, MD  05/04/2020, 4:02 PM

## 2020-05-04 NOTE — Progress Notes (Signed)
Subjective:  CC: [name] is a 55 y.o. female  Hospital stay day 3,   SBO  HPI: NG accidentally pulled this pm.  Tolerated extended clamp trial prior to it. Passing flatus, but still having abd pain  ROS:  General: Denies weight loss, weight gain, fatigue, fevers, chills, and night sweats. Heart: Denies chest pain, palpitations, racing heart, irregular heartbeat, leg pain or swelling, and decreased activity tolerance. Respiratory: Denies breathing difficulty, shortness of breath, wheezing, cough, and sputum. GI: Denies change in appetite, heartburn, nausea, vomiting, constipation, diarrhea, and blood in stool. GU: Denies difficulty urinating, pain with urinating, urgency, frequency, blood in urine.   Objective:   Temp:  [97.9 F (36.6 C)-98.7 F (37.1 C)] 98.3 F (36.8 C) (03/06 0528) Pulse Rate:  [79-90] 79 (03/06 0528) Resp:  [16-20] 16 (03/06 0528) BP: (142-165)/(85-95) 158/95 (03/06 0528) SpO2:  [96 %-99 %] 98 % (03/06 0528)     Height: 5\' 8"  (172.7 cm) Weight: 65.3 kg BMI (Calculated): 21.9   Intake/Output this shift:   Intake/Output Summary (Last 24 hours) at 05/04/2020 0800 Last data filed at 05/04/2020 0962 Gross per 24 hour  Intake --  Output 3120 ml  Net -3120 ml  NG with dark maroon output  Constitutional :  alert, cooperative, appears stated age and no distress  Respiratory:  clear to auscultation bilaterally  Cardiovascular:  regular rate and rhythm  Gastrointestinal: soft, but TTP in epigastric area with slight touch..   Skin: Cool and moist.   Psychiatric: Normal affect, non-agitated, not confused       LABS:  CMP Latest Ref Rng & Units 05/04/2020 05/02/2020 05/01/2020  Glucose 70 - 99 mg/dL 89 104(H) 119(H)  BUN 6 - 20 mg/dL 11 20 19   Creatinine 0.44 - 1.00 mg/dL 0.75 0.72 0.81  Sodium 135 - 145 mmol/L 137 137 134(L)  Potassium 3.5 - 5.1 mmol/L 3.9 3.5 3.8  Chloride 98 - 111 mmol/L 96(L) 99 96(L)  CO2 22 - 32 mmol/L 28 28 24   Calcium 8.9 - 10.3 mg/dL 8.9  8.8(L) 9.7  Total Protein 6.5 - 8.1 g/dL 6.5 6.4(L) 8.3(H)  Total Bilirubin 0.3 - 1.2 mg/dL 1.1 0.8 0.9  Alkaline Phos 38 - 126 U/L 110 131(H) 172(H)  AST 15 - 41 U/L 13(L) 16 19  ALT 0 - 44 U/L 12 17 21    CBC Latest Ref Rng & Units 05/04/2020 05/02/2020 05/01/2020  WBC 4.0 - 10.5 K/uL 12.1(H) 10.1 14.6(H)  Hemoglobin 12.0 - 15.0 g/dL 13.2 14.6 16.8(H)  Hematocrit 36.0 - 46.0 % 39.3 41.9 48.9(H)  Platelets 150 - 400 K/uL 297 327 415(H)    RADS: CLINICAL DATA:  Nasogastric tube placement  EXAM: ABDOMEN - 1 VIEW  COMPARISON:  05/02/2020  FINDINGS: Nasogastric tube tip overlies the expected proximal to mid body of the stomach. The visualized abdominal gas pattern is unremarkable. Lung bases are clear.  IMPRESSION: Nasogastric tube tip within the proximal to mid body of the stomach.   Electronically Signed   By: Fidela Salisbury MD   On: 05/04/2020 07:02  Assessment:   Partial SBO.  TTP in epigastric region unexpected, with associated severe anxiety when trying to repeat exam.    Leukocytosis noted, with continued downtrending of hgb.she otherwise clinically remains stable and KUB reassuring after personally reviewed.  Will start CLD and continue to monitor.  Hold lovenox and toradol for continued drop in Hgb. Continue protonix. Recheck hgb in am  All questions addressed

## 2020-05-04 NOTE — Progress Notes (Signed)
PROGRESS NOTE    [name]  ZHY:[id number] DOB: May 15, 1965 DOA: 05/01/2020 PCP: [name], PA-C    Brief Narrative:  55 year old female with history of COPD, chronic pain syndrome, hypertension, GERD presented to the ER with 2 days of abdominal pain, nausea and vomiting.  In the emergency room she was hemodynamically stable.  She was found to have partial small bowel obstruction.  Admitted with surgical consultation. Patient does have history of traumatic brain injury and significant debility.   Assessment & Plan:   Principal Problem:   SBO (small bowel obstruction) (HCC) Active Problems:   GERD (gastroesophageal reflux disease)   Abdominal pain   Hypertension   Chronic obstructive pulmonary disease (HCC)   Tobacco use   Leukocytosis  Partial small bowel obstruction: Patient is clinically improving.  Use of narcotic pain medication and epigastric pain makes things worse.   NG tube clamped since 6 AM.  Patient does not have any distention.  She is passing flatus.  Allow sips.  Mobilizing around the hallway with her sister.   To be seen by surgery.  Probably can challenge advancing diet.   If continues to have epigastric pain, will talk to gastroenterology.  Does have history of GERD but no peptic ulcers.   Essential hypertension:  Blood pressures are elevated.   Resume Lasix and clamp tube for 30 minutes.   IV Vasotec as needed to keep systolic blood pressure less than 160.    Chronic pain syndrome:  On oxycodone at home.  Will resume on discharge.  COPD:  Stable.  Bronchodilators as needed.  GERD:  On Protonix.  Continue.  IV while NPO. If continues to have abdominal pain, may benefit with upper gi egd, will discuss with EGD.   DVT prophylaxis: SCDs   Code Status: Full code Family Communication: Patient's sister at bedside. Disposition Plan: Status is: Inpatient  Remains inpatient appropriate because:IV treatments appropriate due to intensity of illness or  inability to take PO   Dispo: The patient is from: Home              Anticipated d/c is to: Home              Patient currently is not medically stable to d/c.   Difficult to place patient No         Consultants:   Surgery  Procedures:   None  Antimicrobials:   None   Subjective: Patient seen and examined.  Sister at bedside.  Patient is more comfortable, able to walk around in the hallway today. Patient is more open to conversation today.  She does complain of ongoing pain on her epigastrium.  She still feels distended.  She thinks she has passed some flatus.  No bowel movement.   Objective: Vitals:   05/03/20 1958 05/03/20 2351 05/04/20 0528 05/04/20 0802  BP: (!) 163/92 (!) 161/89 (!) 158/95 (!) 153/92  Pulse: 89 87 79 79  Resp:  16 16 18   Temp: 98.7 F (37.1 C) 98.1 F (36.7 C) 98.3 F (36.8 C) 97.9 F (36.6 C)  TempSrc:  Oral  Oral  SpO2: 96% 98% 98% 97%  Weight:      Height:        Intake/Output Summary (Last 24 hours) at 05/04/2020 1104 Last data filed at 05/04/2020 2952 Gross per 24 hour  Intake --  Output 2900 ml  Net -2900 ml   Filed Weights   05/01/20 1419  Weight: 65.3 kg    Examination:  General exam: Appears calm and comfortable .  Mildly anxious. NG tube with light blood neck strain. Respiratory system: Clear to auscultation. Respiratory effort normal. Cardiovascular system: S1 & S2 heard, RRR. No JVD, murmurs, rubs, gallops or clicks. No pedal edema. Gastrointestinal system: Tenderness along the epigastrium.  No rigidity or guarding..  Bowel sounds are present.   Extremities: Symmetric 5 x 5 power. Skin: No rashes, lesions or ulcers Psychiatry: Judgement and insight appear normal. Mood & affect flat.    Data Reviewed: I have personally reviewed following labs and imaging studies  CBC: Recent Labs  Lab 05/01/20 1424 05/02/20 0341 05/04/20 0446  WBC 14.6* 10.1 12.1*  NEUTROABS  --  6.0 9.4*  HGB 16.8* 14.6 13.2  HCT 48.9*  41.9 39.3  MCV 86.2 86.6 88.3  PLT 415* 327 254   Basic Metabolic Panel: Recent Labs  Lab 05/01/20 1424 05/02/20 0341 05/04/20 0446  NA 134* 137 137  K 3.8 3.5 3.9  CL 96* 99 96*  CO2 24 28 28   GLUCOSE 119* 104* 89  BUN 19 20 11   CREATININE 0.81 0.72 0.75  CALCIUM 9.7 8.8* 8.9  MG 2.4 2.0 1.8  PHOS  --   --  3.0   GFR: Estimated Creatinine Clearance: 82 mL/min (by C-G formula based on SCr of 0.75 mg/dL). Liver Function Tests: Recent Labs  Lab 05/01/20 1424 05/02/20 0341 05/04/20 0446  AST 19 16 13*  ALT 21 17 12   ALKPHOS 172* 131* 110  BILITOT 0.9 0.8 1.1  PROT 8.3* 6.4* 6.5  ALBUMIN 4.6 3.5 3.3*   Recent Labs  Lab 05/01/20 1424  LIPASE 28   No results for input(s): AMMONIA in the last 168 hours. Coagulation Profile: No results for input(s): INR, PROTIME in the last 168 hours. Cardiac Enzymes: No results for input(s): CKTOTAL, CKMB, CKMBINDEX, TROPONINI in the last 168 hours. BNP (last 3 results) No results for input(s): PROBNP in the last 8760 hours. HbA1C: No results for input(s): HGBA1C in the last 72 hours. CBG: No results for input(s): GLUCAP in the last 168 hours. Lipid Profile: No results for input(s): CHOL, HDL, LDLCALC, TRIG, CHOLHDL, LDLDIRECT in the last 72 hours. Thyroid Function Tests: No results for input(s): TSH, T4TOTAL, FREET4, T3FREE, THYROIDAB in the last 72 hours. Anemia Panel: No results for input(s): VITAMINB12, FOLATE, FERRITIN, TIBC, IRON, RETICCTPCT in the last 72 hours. Sepsis Labs: No results for input(s): PROCALCITON, LATICACIDVEN in the last 168 hours.  Recent Results (from the past 240 hour(s))  Resp Panel by RT-PCR (Flu A&B, Covid) Nasopharyngeal Swab     Status: None   Collection Time: 05/01/20  2:24 PM   Specimen: Nasopharyngeal Swab; Nasopharyngeal(NP) swabs in vial transport medium  Result Value Ref Range Status   SARS Coronavirus 2 by RT PCR NEGATIVE NEGATIVE Final    Comment: (NOTE) SARS-CoV-2 target nucleic  acids are NOT DETECTED.  The SARS-CoV-2 RNA is generally detectable in upper respiratory specimens during the acute phase of infection. The lowest concentration of SARS-CoV-2 viral copies this assay can detect is 138 copies/mL. A negative result does not preclude SARS-Cov-2 infection and should not be used as the sole basis for treatment or other patient management decisions. A negative result may occur with  improper specimen collection/handling, submission of specimen other than nasopharyngeal swab, presence of viral mutation(s) within the areas targeted by this assay, and inadequate number of viral copies(<138 copies/mL). A negative result must be combined with clinical observations, patient history, and epidemiological information. The expected  result is Negative.  Fact Sheet for Patients:  EntrepreneurPulse.com.au  Fact Sheet for Healthcare Providers:  IncredibleEmployment.be  This test is no t yet approved or cleared by the [place] FDA and  has been authorized for detection and/or diagnosis of SARS-CoV-2 by FDA under an Emergency Use Authorization (EUA). This EUA will remain  in effect (meaning this test can be used) for the duration of the COVID-19 declaration under Section 564(b)(1) of the Act, 21 U.S.C.section 360bbb-3(b)(1), unless the authorization is terminated  or revoked sooner.       Influenza A by PCR NEGATIVE NEGATIVE Final   Influenza B by PCR NEGATIVE NEGATIVE Final    Comment: (NOTE) The Xpert Xpress SARS-CoV-2/FLU/RSV plus assay is intended as an aid in the diagnosis of influenza from Nasopharyngeal swab specimens and should not be used as a sole basis for treatment. Nasal washings and aspirates are unacceptable for Xpert Xpress SARS-CoV-2/FLU/RSV testing.  Fact Sheet for Patients: EntrepreneurPulse.com.au  Fact Sheet for Healthcare Providers: IncredibleEmployment.be  This  test is not yet approved or cleared by the [place] FDA and has been authorized for detection and/or diagnosis of SARS-CoV-2 by FDA under an Emergency Use Authorization (EUA). This EUA will remain in effect (meaning this test can be used) for the duration of the COVID-19 declaration under Section 564(b)(1) of the Act, 21 U.S.C. section 360bbb-3(b)(1), unless the authorization is terminated or revoked.  Performed at [facility], [address] [place], [place] [address]          Radiology Studies: DG Abd 1 View  Result Date: 05/04/2020 CLINICAL DATA:  Nasogastric tube placement EXAM: ABDOMEN - 1 VIEW COMPARISON:  05/02/2020 FINDINGS: Nasogastric tube tip overlies the expected proximal to mid body of the stomach. The visualized abdominal gas pattern is unremarkable. Lung bases are clear. IMPRESSION: Nasogastric tube tip within the proximal to mid body of the stomach. Electronically Signed   By: Fidela Salisbury MD   On: 05/04/2020 07:02   US Abdomen Limited RUQ (LIVER/GB)  Result Date: 05/02/2020 CLINICAL DATA:  Right upper quadrant tenderness over the last 2 days. EXAM: ULTRASOUND ABDOMEN LIMITED RIGHT UPPER QUADRANT COMPARISON:  CT 05/01/2020 FINDINGS: Gallbladder: No gallstones or wall thickening visualized. No sonographic Murphy sign noted by sonographer. Common bile duct: Diameter: 3 mm, normal Liver: No focal lesion identified. Within normal limits in parenchymal echogenicity. Portal vein is patent on color Doppler imaging with normal direction of blood flow towards the liver. Other: None. IMPRESSION: Normal right upper quadrant ultrasound. Electronically Signed   By: Nelson Chimes M.D.   On: 05/02/2020 15:26        Scheduled Meds:  lisinopril  10 mg Oral Daily   pantoprazole (PROTONIX) IV  40 mg Intravenous Q24H   umeclidinium bromide  1 puff Inhalation Daily   Continuous Infusions:  lactated ringers 100 mL/hr at 05/04/20 0019   magnesium sulfate bolus IVPB        LOS: 3 days    Time spent: 30 minutes    Barb Merino, MD [facility] Pager [phone]

## 2020-05-05 DIAGNOSIS — K56600 Partial intestinal obstruction, unspecified as to cause: Secondary | ICD-10-CM | POA: Diagnosis not present

## 2020-05-05 DIAGNOSIS — K56609 Unspecified intestinal obstruction, unspecified as to partial versus complete obstruction: Secondary | ICD-10-CM | POA: Diagnosis not present

## 2020-05-05 LAB — CBC WITH DIFFERENTIAL/PLATELET
Abs Immature Granulocytes: 0.03 10*3/uL (ref 0.00–0.07)
Basophils Absolute: 0 10*3/uL (ref 0.0–0.1)
Basophils Relative: 0 %
Eosinophils Absolute: 0.1 10*3/uL (ref 0.0–0.5)
Eosinophils Relative: 1 %
HCT: 36.9 % (ref 36.0–46.0)
Hemoglobin: 13 g/dL (ref 12.0–15.0)
Immature Granulocytes: 0 %
Lymphocytes Relative: 24 %
Lymphs Abs: 2.5 10*3/uL (ref 0.7–4.0)
MCH: 30.2 pg (ref 26.0–34.0)
MCHC: 35.2 g/dL (ref 30.0–36.0)
MCV: 85.6 fL (ref 80.0–100.0)
Monocytes Absolute: 0.9 10*3/uL (ref 0.1–1.0)
Monocytes Relative: 8 %
Neutro Abs: 6.9 10*3/uL (ref 1.7–7.7)
Neutrophils Relative %: 67 %
Platelets: 299 10*3/uL (ref 150–400)
RBC: 4.31 MIL/uL (ref 3.87–5.11)
RDW: 12.6 % (ref 11.5–15.5)
WBC: 10.3 10*3/uL (ref 4.0–10.5)
nRBC: 0 % (ref 0.0–0.2)

## 2020-05-05 LAB — BASIC METABOLIC PANEL
Anion gap: 11 (ref 5–15)
BUN: 9 mg/dL (ref 6–20)
CO2: 29 mmol/L (ref 22–32)
Calcium: 8.7 mg/dL — ABNORMAL LOW (ref 8.9–10.3)
Chloride: 97 mmol/L — ABNORMAL LOW (ref 98–111)
Creatinine, Ser: 0.64 mg/dL (ref 0.44–1.00)
GFR, Estimated: >60 mL/min (ref >60)
Glucose, Bld: 92 mg/dL (ref 70–99)
Potassium: 3.6 mmol/L (ref 3.5–5.1)
Sodium: 137 mmol/L (ref 135–145)

## 2020-05-05 LAB — MAGNESIUM: Magnesium: 2.2 mg/dL (ref 1.7–2.4)

## 2020-05-05 MED ORDER — HYDROCORTISONE 1 % EX CREA
TOPICAL_CREAM | Freq: Two times a day (BID) | CUTANEOUS | Status: DC
  Filled 2020-05-05: qty 28

## 2020-05-05 NOTE — Progress Notes (Signed)
PROGRESS NOTE    [name]  TOI:[id number] DOB: [birth year] DOA: 05/01/2020 PCP: [name], PA-C    Brief Narrative:  55 year old female with history of COPD, chronic pain syndrome, hypertension, GERD presented to the ER with 2 days of abdominal pain, nausea and vomiting.  In the emergency room she was hemodynamically stable.  She was found to have partial small bowel obstruction.  Admitted with surgical consultation. Patient does have history of traumatic brain injury and significant debility.   Assessment & Plan:   Principal Problem:   SBO (small bowel obstruction) (HCC) Active Problems:   GERD (gastroesophageal reflux disease)   Abdominal pain   Hypertension   Chronic obstructive pulmonary disease (HCC)   Tobacco use   Leukocytosis  Partial small bowel obstruction: Patient is clinically improving.  Use of narcotic pain medication and epigastric pain makes things worse.   Tolerating clears.  He is on chronic epigastric pain remains the issue. Electrolytes are stable. As per surgical plan, advancing to full liquid diet by lunchtime. Currently on twice a day PPI.  She is planned for outpatient EGD and colonoscopy that she will keep up.  Essential hypertension: Blood pressure stable after resuming home medications. Resume Lasix and clamp tube for 30 minutes.   IV Vasotec as needed to keep systolic blood pressure less than 160.    Chronic pain syndrome:  On oxycodone at home.  Will resume on discharge.  COPD:  Stable.  Bronchodilators as needed.  GERD:  On Protonix.  Continue.  IV while NPO. With ongoing abdominal pain, increased dose of PPI.  Seen by GI.  Will keep up with outpatient follow-up and procedure plans to evaluate for gastroparesis, peptic ulcer disease and she is also scheduled for screening colonoscopy.   DVT prophylaxis: SCDs   Code Status: Full code Family Communication: Patient's sister at bedside. Disposition Plan: Status is: Inpatient  Remains  inpatient appropriate because:IV treatments appropriate due to intensity of illness or inability to take PO   Dispo: The patient is from: Home              Anticipated d/c is to: Home              Patient currently is not medically stable to d/c.   Difficult to place patient No         Consultants:   Surgery  GI  Procedures:   None  Antimicrobials:   None   Subjective: Patient seen and examined.  Today she is more comfortable and pleasant to conversation.  Sister at the bedside. She has persistent pain and she is trying to space out the pain medications. Patient is passing flatus, no bowel movement yet.  She was happy to have some yogurt.   Objective: Vitals:   05/04/20 1933 05/05/20 0011 05/05/20 0510 05/05/20 0813  BP: (!) 164/88 135/83 127/89 (!) 149/89  Pulse: 80 72 71 70  Resp: 17 17 17 16   Temp: 98.4 F (36.9 C) 98.4 F (36.9 C) 97.9 F (36.6 C) 98 F (36.7 C)  TempSrc: Oral Oral Oral   SpO2: 96% 96% 96% 96%  Weight:      Height:        Intake/Output Summary (Last 24 hours) at 05/05/2020 0940 Last data filed at 05/05/2020 0646 Gross per 24 hour  Intake 877.27 ml  Output --  Net 877.27 ml   Filed Weights   05/01/20 1419  Weight: 65.3 kg    Examination:  General: Looks comfortable.  Without any distress.  Mild anxiety due to epigastric pain. Cardiovascular: S1-S2 normal.  No added sounds Respiratory: Bilateral clear.  No added sounds Gastrointestinal: Soft.  No residual guarding.  Bowel sounds present.  Mild tenderness at the epigastrium. Ext: No cyanosis or edema. Neuro: No focal deficits.   Data Reviewed: I have personally reviewed following labs and imaging studies  CBC: Recent Labs  Lab 05/01/20 1424 05/02/20 0341 05/04/20 0446 05/05/20 0554  WBC 14.6* 10.1 12.1* 10.3  NEUTROABS  --  6.0 9.4* 6.9  HGB 16.8* 14.6 13.2 13.0  HCT 48.9* 41.9 39.3 36.9  MCV 86.2 86.6 88.3 85.6  PLT 415* 327 297 585   Basic Metabolic  Panel: Recent Labs  Lab 05/01/20 1424 05/02/20 0341 05/04/20 0446 05/05/20 0554  NA 134* 137 137 137  K 3.8 3.5 3.9 3.6  CL 96* 99 96* 97*  CO2 24 28 28 29   GLUCOSE 119* 104* 89 92  BUN 19 20 11 9   CREATININE 0.81 0.72 0.75 0.64  CALCIUM 9.7 8.8* 8.9 8.7*  MG 2.4 2.0 1.8 2.2  PHOS  --   --  3.0  --    GFR: Estimated Creatinine Clearance: 82 mL/min (by C-G formula based on SCr of 0.64 mg/dL). Liver Function Tests: Recent Labs  Lab 05/01/20 1424 05/02/20 0341 05/04/20 0446  AST 19 16 13*  ALT 21 17 12   ALKPHOS 172* 131* 110  BILITOT 0.9 0.8 1.1  PROT 8.3* 6.4* 6.5  ALBUMIN 4.6 3.5 3.3*   Recent Labs  Lab 05/01/20 1424  LIPASE 28   No results for input(s): AMMONIA in the last 168 hours. Coagulation Profile: No results for input(s): INR, PROTIME in the last 168 hours. Cardiac Enzymes: No results for input(s): CKTOTAL, CKMB, CKMBINDEX, TROPONINI in the last 168 hours. BNP (last 3 results) No results for input(s): PROBNP in the last 8760 hours. HbA1C: No results for input(s): HGBA1C in the last 72 hours. CBG: No results for input(s): GLUCAP in the last 168 hours. Lipid Profile: No results for input(s): CHOL, HDL, LDLCALC, TRIG, CHOLHDL, LDLDIRECT in the last 72 hours. Thyroid Function Tests: No results for input(s): TSH, T4TOTAL, FREET4, T3FREE, THYROIDAB in the last 72 hours. Anemia Panel: No results for input(s): VITAMINB12, FOLATE, FERRITIN, TIBC, IRON, RETICCTPCT in the last 72 hours. Sepsis Labs: No results for input(s): PROCALCITON, LATICACIDVEN in the last 168 hours.  Recent Results (from the past 240 hour(s))  Resp Panel by RT-PCR (Flu A&B, Covid) Nasopharyngeal Swab     Status: None   Collection Time: 05/01/20  2:24 PM   Specimen: Nasopharyngeal Swab; Nasopharyngeal(NP) swabs in vial transport medium  Result Value Ref Range Status   SARS Coronavirus 2 by RT PCR NEGATIVE NEGATIVE Final    Comment: (NOTE) SARS-CoV-2 target nucleic acids are NOT  DETECTED.  The SARS-CoV-2 RNA is generally detectable in upper respiratory specimens during the acute phase of infection. The lowest concentration of SARS-CoV-2 viral copies this assay can detect is 138 copies/mL. A negative result does not preclude SARS-Cov-2 infection and should not be used as the sole basis for treatment or other patient management decisions. A negative result may occur with  improper specimen collection/handling, submission of specimen other than nasopharyngeal swab, presence of viral mutation(s) within the areas targeted by this assay, and inadequate number of viral copies(<138 copies/mL). A negative result must be combined with clinical observations, patient history, and epidemiological information. The expected result is Negative.  Fact Sheet for Patients:  EntrepreneurPulse.com.au  Fact  Sheet for Healthcare Providers:  IncredibleEmployment.be  This test is no t yet approved or cleared by the [place] FDA and  has been authorized for detection and/or diagnosis of SARS-CoV-2 by FDA under an Emergency Use Authorization (EUA). This EUA will remain  in effect (meaning this test can be used) for the duration of the COVID-19 declaration under Section 564(b)(1) of the Act, 21 U.S.C.section 360bbb-3(b)(1), unless the authorization is terminated  or revoked sooner.       Influenza A by PCR NEGATIVE NEGATIVE Final   Influenza B by PCR NEGATIVE NEGATIVE Final    Comment: (NOTE) The Xpert Xpress SARS-CoV-2/FLU/RSV plus assay is intended as an aid in the diagnosis of influenza from Nasopharyngeal swab specimens and should not be used as a sole basis for treatment. Nasal washings and aspirates are unacceptable for Xpert Xpress SARS-CoV-2/FLU/RSV testing.  Fact Sheet for Patients: EntrepreneurPulse.com.au  Fact Sheet for Healthcare Providers: IncredibleEmployment.be  This test is not yet  approved or cleared by the [place] FDA and has been authorized for detection and/or diagnosis of SARS-CoV-2 by FDA under an Emergency Use Authorization (EUA). This EUA will remain in effect (meaning this test can be used) for the duration of the COVID-19 declaration under Section 564(b)(1) of the Act, 21 U.S.C. section 360bbb-3(b)(1), unless the authorization is terminated or revoked.  Performed at [facility], [address], [place], [place] [address]          Radiology Studies: DG Abd 1 View  Result Date: 05/04/2020 CLINICAL DATA:  Nasogastric tube placement EXAM: ABDOMEN - 1 VIEW COMPARISON:  05/02/2020 FINDINGS: Nasogastric tube tip overlies the expected proximal to mid body of the stomach. The visualized abdominal gas pattern is unremarkable. Lung bases are clear. IMPRESSION: Nasogastric tube tip within the proximal to mid body of the stomach. Electronically Signed   By: Fidela Salisbury MD   On: 05/04/2020 07:02        Scheduled Meds: . lisinopril  10 mg Oral Daily  . pantoprazole (PROTONIX) IV  40 mg Intravenous Q12H  . umeclidinium bromide  1 puff Inhalation Daily   Continuous Infusions: . lactated ringers 100 mL/hr at 05/05/20 0646     LOS: 4 days    Time spent: 25 minutes    Barb Merino, MD [facility] Pager [phone]

## 2020-05-05 NOTE — Progress Notes (Signed)
Cephas Darby, MD [address]  [place]  [place], [place] [address]  Main: [phone]  Fax: [phone] Pager: [phone]   Subjective: Patient is admitted with partial small bowel obstruction.  NG tube has been accidentally pulled out.  Patient reports that her symptoms are better including abdominal pain, nausea and vomiting.  She is passing flatus, no bowel movement yet.  She has been tolerating clear liquids well. Patient's sister is bedside  Objective: Vital signs in last 24 hours: Vitals:   05/05/20 0510 05/05/20 0813 05/05/20 1115 05/05/20 1602  BP: 127/89 (!) 149/89 (!) 156/94 (!) 151/96  Pulse: 71 70 70 67  Resp: 17 16 18 16   Temp: 97.9 F (36.6 C) 98 F (36.7 C) 98.2 F (36.8 C) 97.9 F (36.6 C)  TempSrc: Oral   Oral  SpO2: 96% 96% 95% 97%  Weight:      Height:       Weight change:   Intake/Output Summary (Last 24 hours) at 05/05/2020 1609 Last data filed at 05/05/2020 1014 Gross per 24 hour  Intake 1417.27 ml  Output --  Net 1417.27 ml     Exam: Heart:: Regular rate and rhythm, S1S2 present or without murmur or extra heart sounds Lungs: normal and clear to auscultation Abdomen: soft, nontender, normal bowel sounds   Lab Results: CBC Latest Ref Rng & Units 05/05/2020 05/04/2020 05/02/2020  WBC 4.0 - 10.5 K/uL 10.3 12.1(H) 10.1  Hemoglobin 12.0 - 15.0 g/dL 13.0 13.2 14.6  Hematocrit 36.0 - 46.0 % 36.9 39.3 41.9  Platelets 150 - 400 K/uL 299 297 327   CMP Latest Ref Rng & Units 05/05/2020 05/04/2020 05/02/2020  Glucose 70 - 99 mg/dL 92 89 104(H)  BUN 6 - 20 mg/dL 9 11 20   Creatinine 0.44 - 1.00 mg/dL 0.64 0.75 0.72  Sodium 135 - 145 mmol/L 137 137 137  Potassium 3.5 - 5.1 mmol/L 3.6 3.9 3.5  Chloride 98 - 111 mmol/L 97(L) 96(L) 99  CO2 22 - 32 mmol/L 29 28 28   Calcium 8.9 - 10.3 mg/dL 8.7(L) 8.9 8.8(L)  Total Protein 6.5 - 8.1 g/dL - 6.5 6.4(L)  Total Bilirubin 0.3 - 1.2 mg/dL - 1.1 0.8  Alkaline Phos 38 - 126 U/L - 110 131(H)  AST 15 - 41  U/L - 13(L) 16  ALT 0 - 44 U/L - 12 17    Micro Results: Recent Results (from the past 240 hour(s))  Resp Panel by RT-PCR (Flu A&B, Covid) Nasopharyngeal Swab     Status: None   Collection Time: 05/01/20  2:24 PM   Specimen: Nasopharyngeal Swab; Nasopharyngeal(NP) swabs in vial transport medium  Result Value Ref Range Status   SARS Coronavirus 2 by RT PCR NEGATIVE NEGATIVE Final    Comment: (NOTE) SARS-CoV-2 target nucleic acids are NOT DETECTED.  The SARS-CoV-2 RNA is generally detectable in upper respiratory specimens during the acute phase of infection. The lowest concentration of SARS-CoV-2 viral copies this assay can detect is 138 copies/mL. A negative result does not preclude SARS-Cov-2 infection and should not be used as the sole basis for treatment or other patient management decisions. A negative result may occur with  improper specimen collection/handling, submission of specimen other than nasopharyngeal swab, presence of viral mutation(s) within the areas targeted by this assay, and inadequate number of viral copies(<138 copies/mL). A negative result must be combined with clinical observations, patient history, and epidemiological information. The expected result is Negative.  Fact Sheet for Patients:  EntrepreneurPulse.com.au  Fact Sheet for Healthcare Providers:  IncredibleEmployment.be  This test is no t yet approved or cleared by the [place] FDA and  has been authorized for detection and/or diagnosis of SARS-CoV-2 by FDA under an Emergency Use Authorization (EUA). This EUA will remain  in effect (meaning this test can be used) for the duration of the COVID-19 declaration under Section 564(b)(1) of the Act, 21 U.S.C.section 360bbb-3(b)(1), unless the authorization is terminated  or revoked sooner.       Influenza A by PCR NEGATIVE NEGATIVE Final   Influenza B by PCR NEGATIVE NEGATIVE Final    Comment: (NOTE) The  Xpert Xpress SARS-CoV-2/FLU/RSV plus assay is intended as an aid in the diagnosis of influenza from Nasopharyngeal swab specimens and should not be used as a sole basis for treatment. Nasal washings and aspirates are unacceptable for Xpert Xpress SARS-CoV-2/FLU/RSV testing.  Fact Sheet for Patients: EntrepreneurPulse.com.au  Fact Sheet for Healthcare Providers: IncredibleEmployment.be  This test is not yet approved or cleared by the [place] FDA and has been authorized for detection and/or diagnosis of SARS-CoV-2 by FDA under an Emergency Use Authorization (EUA). This EUA will remain in effect (meaning this test can be used) for the duration of the COVID-19 declaration under Section 564(b)(1) of the Act, 21 U.S.C. section 360bbb-3(b)(1), unless the authorization is terminated or revoked.  Performed at [facility], [address] [place], [place] [address]    Studies/Results: DG Abd 1 View  Result Date: 05/04/2020 CLINICAL DATA:  Nasogastric tube placement EXAM: ABDOMEN - 1 VIEW COMPARISON:  05/02/2020 FINDINGS: Nasogastric tube tip overlies the expected proximal to mid body of the stomach. The visualized abdominal gas pattern is unremarkable. Lung bases are clear. IMPRESSION: Nasogastric tube tip within the proximal to mid body of the stomach. Electronically Signed   By: Fidela Salisbury MD   On: 05/04/2020 07:02   Medications:  I have reviewed the patient's current medications. Prior to Admission:  Medications Prior to Admission  Medication Sig Dispense Refill Last Dose  . Tiotropium Bromide Monohydrate (SPIRIVA RESPIMAT) 2.5 MCG/ACT AERS Inhale 2 puffs into the lungs daily. 4 g 5 Past Week at Unknown time  . albuterol (VENTOLIN HFA) 108 (90 Base) MCG/ACT inhaler INL 2 PFS PO Q 4 TO 6 H IF NEEDED 18 g 3 unknown at prn  . butalbital-acetaminophen-caffeine (FIORICET) 50-325-40 MG tablet Take 1 tablet by mouth every 6 (six) hours as  needed for headache. 30 tablet 0 unknown at prn  . carisoprodol (SOMA) 350 MG tablet Take 700 mg by mouth 2 (two) times daily.    04/29/2020 at 2000  . Docusate Sodium (DSS) 100 MG CAPS TK ONE C PO BID PRF MILD CONSTIPATION   unknown at prn  . EMGALITY 120 MG/ML SOAJ Inject 1 mL into the muscle every 28 (twenty-eight) days.     . fexofenadine (ALLEGRA) 180 MG tablet Take 180 mg by mouth daily.    unknown at prn  . lisinopril-hydrochlorothiazide (ZESTORETIC) 10-12.5 MG tablet TAKE 1 TABLET BY MOUTH EVERY DAY 90 tablet 0 04/29/2020 at 2000  . oxyCODONE (OXY IR/ROXICODONE) 5 MG immediate release tablet Take 5 mg by mouth 2 (two) times daily.   04/29/2020 at 2000  . pantoprazole (PROTONIX) 40 MG tablet Take 1 tablet (40 mg total) by mouth 2 (two) times daily. 180 tablet 3 04/29/2020 at 2000   Scheduled: . hydrocortisone cream   Topical BID  . lisinopril  10 mg Oral Daily  . pantoprazole (PROTONIX) IV  40 mg Intravenous Q12H  . umeclidinium bromide  1 puff Inhalation Daily   Continuous: . lactated ringers 100 mL/hr at 05/05/20 0646   YOF:VWAQLRJPV, enalaprilat, HYDROmorphone (DILAUDID) injection, nicotine, ondansetron (ZOFRAN) IV, phenol Anti-infectives (From admission, onward)   None     Scheduled Meds: . hydrocortisone cream   Topical BID  . lisinopril  10 mg Oral Daily  . pantoprazole (PROTONIX) IV  40 mg Intravenous Q12H  . umeclidinium bromide  1 puff Inhalation Daily   Continuous Infusions: . lactated ringers 100 mL/hr at 05/05/20 0646   PRN Meds:.albuterol, enalaprilat, HYDROmorphone (DILAUDID) injection, nicotine, ondansetron (ZOFRAN) IV, phenol   Assessment: Principal Problem:   SBO (small bowel obstruction) (HCC) Active Problems:   GERD (gastroesophageal reflux disease)   Abdominal pain   Hypertension   Chronic obstructive pulmonary disease (HCC)   Tobacco use   Leukocytosis   Plan: Partial small bowel obstruction, s/p decompression with NG tube Patient is tolerating  clear liquids Continue clear liquids only Recommend upper endoscopy as well as colonoscopy tentatively on 3/9 if patient continues to tolerate clear liquids and starts having bowel movements    LOS: 4 days   Laporsche Hoeger 05/05/2020, 4:09 PM

## 2020-05-05 NOTE — Progress Notes (Signed)
Handoff received from [facility]. In agreement with previous RN's assessment. Pt resting quietly with eyes closed.

## 2020-05-05 NOTE — Plan of Care (Signed)
End of Shift Summary:  A&O x4. VSS Remained on room air, sats >96%. Pain and nausea managed with prn medications. No episodes of emesis. Pt reports (+) flatus, (-) BM overnight. Ambulation encouraged. Remained free from falls or injury. Call bell within reach and able to use.   Problem: Education: Goal: Knowledge of General Education information will improve Description: Including pain rating scale, medication(s)/side effects and non-pharmacologic comfort measures Outcome: Progressing   Problem: Health Behavior/Discharge Planning: Goal: Ability to manage health-related needs will improve Outcome: Progressing   Problem: Clinical Measurements: Goal: Ability to maintain clinical measurements within normal limits will improve Outcome: Progressing Goal: Will remain free from infection Outcome: Progressing Goal: Diagnostic test results will improve Outcome: Progressing Goal: Respiratory complications will improve Outcome: Progressing Goal: Cardiovascular complication will be avoided Outcome: Progressing

## 2020-05-05 NOTE — Progress Notes (Signed)
[facility] SURGICAL PROGRESS NOTE (cpt [phone])  Hospital Day(s): 4.   Interval History: Patient seen and examined, no acute events or new complaints overnight. Patient reports she may feel a little better but still with upper abdominal pain which seems worse with coughing/movements and typically resolves at rest. She has some nausea but improved since NGT out. No fever, chills, SOB, SP, or emesis. Labs this morning are reassuring and without leukocytosis, renal function normal with sCr - 0.64, and was without electrolyte derangements. No new imaging. She has been on CLD and tolerating well. She does endorse flatus but no BM. She has been moving with help of her sister.  Review of Systems:  Constitutional: denies fever, chills  HEENT: denies cough or congestion  Respiratory: denies any shortness of breath  Cardiovascular: denies chest pain or palpitations  Gastrointestinal:+ abdominal pain, + nausea, denied emesis Genitourinary: denies burning with urination or urinary frequency Musculoskeletal: denies pain, decreased motor or sensation  Vital signs in last 24 hours: [min-max] current  Temp:  [97.9 F (36.6 C)-99.1 F (37.3 C)] 97.9 F (36.6 C) (03/07 0510) Pulse Rate:  [71-86] 71 (03/07 0510) Resp:  [16-18] 17 (03/07 0510) BP: (127-166)/(83-103) 127/89 (03/07 0510) SpO2:  [96 %-98 %] 96 % (03/07 0510)     Height: 5\' 8"  (172.7 cm) Weight: 65.3 kg BMI (Calculated): 21.9   Intake/Output last 2 shifts:  03/06 0701 - 03/07 0700 In: 877.3 [I.V.:827.3; IV Piggyback:50] Out: -    Physical Exam:  Constitutional: alert, cooperative and no distress  HENT: normocephalic without obvious abnormality  Eyes: PERRL, EOM's grossly intact and symmetric  Respiratory: breathing non-labored at rest  Cardiovascular: regular rate and sinus rhythm  Gastrointestinal: Soft, she does have tenderness to the upper abdomen, non-distended, no rebound/guarding Musculoskeletal: no edema or  wounds, motor and sensation grossly intact, NT    Labs:  CBC Latest Ref Rng & Units 05/05/2020 05/04/2020 05/02/2020  WBC 4.0 - 10.5 K/uL 10.3 12.1(H) 10.1  Hemoglobin 12.0 - 15.0 g/dL 13.0 13.2 14.6  Hematocrit 36.0 - 46.0 % 36.9 39.3 41.9  Platelets 150 - 400 K/uL 299 297 327   CMP Latest Ref Rng & Units 05/05/2020 05/04/2020 05/02/2020  Glucose 70 - 99 mg/dL 92 89 104(H)  BUN 6 - 20 mg/dL 9 11 20   Creatinine 0.44 - 1.00 mg/dL 0.64 0.75 0.72  Sodium 135 - 145 mmol/L 137 137 137  Potassium 3.5 - 5.1 mmol/L 3.6 3.9 3.5  Chloride 98 - 111 mmol/L 97(L) 96(L) 99  CO2 22 - 32 mmol/L 29 28 28   Calcium 8.9 - 10.3 mg/dL 8.7(L) 8.9 8.8(L)  Total Protein 6.5 - 8.1 g/dL - 6.5 6.4(L)  Total Bilirubin 0.3 - 1.2 mg/dL - 1.1 0.8  Alkaline Phos 38 - 126 U/L - 110 131(H)  AST 15 - 41 U/L - 13(L) 16  ALT 0 - 44 U/L - 12 17     Imaging studies: No new pertinent imaging studies   Assessment/Plan: (ICD-10's: K70.609) 55 y.o. female with resolving pSBO vs gastroenteritis vs exacerbation of possible underlying gastroparesis, now with return of bowel function. I suspect her abdominal pain may be more likely musculoskeletal in nature.     - Continue CLD for now; offered advance to FLD later in the afternoon if amenable  - Appreciate GI involvement and recommendations   - Monitor abdominal examination; on-going bowel function             - Pain control prn; antiemetics prn             -  No emergent interventions; she understands that if she were to fail to resolve with conservative measure we would need to consider surgical intervention             - Serial KUBs as needed             - Mobilization encouraged              - Further management per primary service; we will follow    All of the above findings and recommendations were discussed with the patient, patient's family (sister at bedside), and the medical team, and all of patient's and family's questions were answered to their expressed  satisfaction.  -- Edison Simon, PA-C [facility] 05/05/2020, 7:15 AM [phone] M-F: 7am - 4pm

## 2020-05-06 ENCOUNTER — Telehealth: Payer: Self-pay | Admitting: Gastroenterology

## 2020-05-06 DIAGNOSIS — R10811 Right upper quadrant abdominal tenderness: Secondary | ICD-10-CM | POA: Diagnosis not present

## 2020-05-06 DIAGNOSIS — K56609 Unspecified intestinal obstruction, unspecified as to partial versus complete obstruction: Secondary | ICD-10-CM | POA: Diagnosis not present

## 2020-05-06 DIAGNOSIS — K56600 Partial intestinal obstruction, unspecified as to cause: Secondary | ICD-10-CM | POA: Diagnosis not present

## 2020-05-06 MED ORDER — POLYETHYLENE GLYCOL 3350 17 G PO PACK
17.0000 g | PACK | Freq: Two times a day (BID) | ORAL | Status: DC
  Administered 2020-05-06 – 2020-05-10 (×8): 17 g via ORAL
  Filled 2020-05-06 (×8): qty 1

## 2020-05-06 MED ORDER — POLYETHYLENE GLYCOL 3350 17 GM/SCOOP PO POWD
1.0000 | Freq: Once | ORAL | Status: AC
  Administered 2020-05-06: 255 g via ORAL
  Filled 2020-05-06: qty 255

## 2020-05-06 MED ORDER — BOOST / RESOURCE BREEZE PO LIQD CUSTOM
1.0000 | Freq: Three times a day (TID) | ORAL | Status: DC
  Administered 2020-05-06 – 2020-05-09 (×4): 1 via ORAL

## 2020-05-06 MED ORDER — SODIUM CHLORIDE 0.9 % IV SOLN
INTRAVENOUS | Status: DC
  Administered 2020-05-07: 10 mL/h via INTRAVENOUS

## 2020-05-06 MED ORDER — SODIUM CHLORIDE 0.9 % IV SOLN: INTRAVENOUS | Status: DC

## 2020-05-06 NOTE — Progress Notes (Signed)
Becky Darby, MD [address]  [place]  [place], [place] [address]  Main: [phone]  Fax: [phone] Pager: [phone]   Subjective: Patient has rough evening yesterday, had issues with her IV line.  She reports that she is passing flatus.  Denies any abdominal pain, she has been tolerating clear liquids well.  She took a dose of MiraLAX today Patient's sister is bedside  Objective: Vital signs in last 24 hours: Vitals:   05/05/20 2206 05/06/20 0037 05/06/20 0451 05/06/20 0813  BP: (!) 171/89 115/84 101/76 107/77  Pulse: 80 75 70 71  Resp: 18 16 18 16   Temp: 98.5 F (36.9 C) 98.6 F (37 C) 98.5 F (36.9 C) 97.9 F (36.6 C)  TempSrc:  Oral Oral   SpO2: 98% 95% 94% 95%  Weight:      Height:       Weight change:   Intake/Output Summary (Last 24 hours) at 05/06/2020 1404 Last data filed at 05/06/2020 1401 Gross per 24 hour  Intake 2230.42 ml  Output 1000 ml  Net 1230.42 ml     Exam: Heart:: Regular rate and rhythm, S1S2 present or without murmur or extra heart sounds Lungs: normal and clear to auscultation Abdomen: soft, nontender, normal bowel sounds   Lab Results: CBC Latest Ref Rng & Units 05/05/2020 05/04/2020 05/02/2020  WBC 4.0 - 10.5 K/uL 10.3 12.1(H) 10.1  Hemoglobin 12.0 - 15.0 g/dL 13.0 13.2 14.6  Hematocrit 36.0 - 46.0 % 36.9 39.3 41.9  Platelets 150 - 400 K/uL 299 297 327   CMP Latest Ref Rng & Units 05/05/2020 05/04/2020 05/02/2020  Glucose 70 - 99 mg/dL 92 89 104(H)  BUN 6 - 20 mg/dL 9 11 20   Creatinine 0.44 - 1.00 mg/dL 0.64 0.75 0.72  Sodium 135 - 145 mmol/L 137 137 137  Potassium 3.5 - 5.1 mmol/L 3.6 3.9 3.5  Chloride 98 - 111 mmol/L 97(L) 96(L) 99  CO2 22 - 32 mmol/L 29 28 28   Calcium 8.9 - 10.3 mg/dL 8.7(L) 8.9 8.8(L)  Total Protein 6.5 - 8.1 g/dL - 6.5 6.4(L)  Total Bilirubin 0.3 - 1.2 mg/dL - 1.1 0.8  Alkaline Phos 38 - 126 U/L - 110 131(H)  AST 15 - 41 U/L - 13(L) 16  ALT 0 - 44 U/L - 12 17    Micro Results: Recent Results  (from the past 240 hour(s))  Resp Panel by RT-PCR (Flu A&B, Covid) Nasopharyngeal Swab     Status: None   Collection Time: 05/01/20  2:24 PM   Specimen: Nasopharyngeal Swab; Nasopharyngeal(NP) swabs in vial transport medium  Result Value Ref Range Status   SARS Coronavirus 2 by RT PCR NEGATIVE NEGATIVE Final    Comment: (NOTE) SARS-CoV-2 target nucleic acids are NOT DETECTED.  The SARS-CoV-2 RNA is generally detectable in upper respiratory specimens during the acute phase of infection. The lowest concentration of SARS-CoV-2 viral copies this assay can detect is 138 copies/mL. A negative result does not preclude SARS-Cov-2 infection and should not be used as the sole basis for treatment or other patient management decisions. A negative result may occur with  improper specimen collection/handling, submission of specimen other than nasopharyngeal swab, presence of viral mutation(s) within the areas targeted by this assay, and inadequate number of viral copies(<138 copies/mL). A negative result must be combined with clinical observations, patient history, and epidemiological information. The expected result is Negative.  Fact Sheet for Patients:  EntrepreneurPulse.com.au  Fact Sheet for Healthcare Providers:  IncredibleEmployment.be  This test is no t yet approved or cleared by the [place] and  has been authorized for detection and/or diagnosis of SARS-CoV-2 by FDA under an Emergency Use Authorization (EUA). This EUA will remain  in effect (meaning this test can be used) for the duration of the COVID-19 declaration under Section 564(b)(1) of the Act, 21 U.S.C.section 360bbb-3(b)(1), unless the authorization is terminated  or revoked sooner.       Influenza A by PCR NEGATIVE NEGATIVE Final   Influenza B by PCR NEGATIVE NEGATIVE Final    Comment: (NOTE) The Xpert Xpress SARS-CoV-2/FLU/RSV plus assay is intended as an aid in the  diagnosis of influenza from Nasopharyngeal swab specimens and should not be used as a sole basis for treatment. Nasal washings and aspirates are unacceptable for Xpert Xpress SARS-CoV-2/FLU/RSV testing.  Fact Sheet for Patients: EntrepreneurPulse.com.au  Fact Sheet for Healthcare Providers: IncredibleEmployment.be  This test is not yet approved or cleared by the [place] FDA and has been authorized for detection and/or diagnosis of SARS-CoV-2 by FDA under an Emergency Use Authorization (EUA). This EUA will remain in effect (meaning this test can be used) for the duration of the COVID-19 declaration under Section 564(b)(1) of the Act, 21 U.S.C. section 360bbb-3(b)(1), unless the authorization is terminated or revoked.  Performed at [facility], [address] [place], [place] [address]    Studies/Results: No results found. Medications:  I have reviewed the patient's current medications. Prior to Admission:  Medications Prior to Admission  Medication Sig Dispense Refill Last Dose  . Tiotropium Bromide Monohydrate (SPIRIVA RESPIMAT) 2.5 MCG/ACT AERS Inhale 2 puffs into the lungs daily. 4 g 5 Past Week at Unknown time  . albuterol (VENTOLIN HFA) 108 (90 Base) MCG/ACT inhaler INL 2 PFS PO Q 4 TO 6 H IF NEEDED 18 g 3 unknown at prn  . butalbital-acetaminophen-caffeine (FIORICET) 50-325-40 MG tablet Take 1 tablet by mouth every 6 (six) hours as needed for headache. 30 tablet 0 unknown at prn  . carisoprodol (SOMA) 350 MG tablet Take 700 mg by mouth 2 (two) times daily.    04/29/2020 at 2000  . Docusate Sodium (DSS) 100 MG CAPS TK ONE C PO BID PRF MILD CONSTIPATION   unknown at prn  . EMGALITY 120 MG/ML SOAJ Inject 1 mL into the muscle every 28 (twenty-eight) days.     . fexofenadine (ALLEGRA) 180 MG tablet Take 180 mg by mouth daily.    unknown at prn  . lisinopril-hydrochlorothiazide (ZESTORETIC) 10-12.5 MG tablet TAKE 1 TABLET BY MOUTH  EVERY DAY 90 tablet 0 04/29/2020 at 2000  . oxyCODONE (OXY IR/ROXICODONE) 5 MG immediate release tablet Take 5 mg by mouth 2 (two) times daily.   04/29/2020 at 2000  . pantoprazole (PROTONIX) 40 MG tablet Take 1 tablet (40 mg total) by mouth 2 (two) times daily. 180 tablet 3 04/29/2020 at 2000   Scheduled: . feeding supplement  1 Container Oral TID BM  . hydrocortisone cream   Topical BID  . lisinopril  10 mg Oral Daily  . pantoprazole (PROTONIX) IV  40 mg Intravenous Q12H  . polyethylene glycol  17 g Oral BID  . polyethylene glycol powder  1 Container Oral Once  . umeclidinium bromide  1 puff Inhalation Daily   Continuous: . sodium chloride    . lactated ringers 100 mL/hr at 05/06/20 0424   BZJ:[id number], enalaprilat, HYDROmorphone (DILAUDID) injection, nicotine, ondansetron (ZOFRAN) IV, phenol Anti-infectives (From admission, onward)   None  Scheduled Meds: . feeding supplement  1 Container Oral TID BM  . hydrocortisone cream   Topical BID  . lisinopril  10 mg Oral Daily  . pantoprazole (PROTONIX) IV  40 mg Intravenous Q12H  . polyethylene glycol  17 g Oral BID  . polyethylene glycol powder  1 Container Oral Once  . umeclidinium bromide  1 puff Inhalation Daily   Continuous Infusions: . sodium chloride    . lactated ringers 100 mL/hr at 05/06/20 0424   PRN Meds:.albuterol, enalaprilat, HYDROmorphone (DILAUDID) injection, nicotine, ondansetron (ZOFRAN) IV, phenol   Assessment: Principal Problem:   SBO (small bowel obstruction) (HCC) Active Problems:   GERD (gastroesophageal reflux disease)   Abdominal pain   Hypertension   Chronic obstructive pulmonary disease (HCC)   Tobacco use   Leukocytosis   Plan: Partial small bowel obstruction, s/p decompression with NG tube Patient is tolerating clear liquids and small bowel obstruction has almost resolved as patient is passing flatus Continue clear liquids only Recommend upper endoscopy as well as colonoscopy tomorrow   Bowel prep ordered N.p.o. effective 5 AM  I have discussed alternative options, risks & benefits,  which include, but are not limited to, bleeding, infection, perforation,respiratory complication & drug reaction.  The patient agrees with this plan & written consent will be obtained.       LOS: 5 days   [name] 05/06/2020, 2:04 PM

## 2020-05-06 NOTE — Progress Notes (Signed)
[facility] SURGICAL PROGRESS NOTE (cpt [phone])  Hospital Day(s): 5.   Interval History: Patient seen and examined, no acute events or new complaints overnight. Patient looks significantly better but still with  C/o upper abdominal pain which seems worse with coughing/movements and typically resolves at rest. She has tolerated clear liquids thus far, and will remain on them due to plans for her colonoscopy for tomorrow. No fever, chills, SOB, SP, or emesis.  No new labs.. No new imaging. She does endorse flatus but no BM.  Her sister is present at bedside and confirms her progress. Review of Systems:  Constitutional: denies fever, chills  HEENT: denies cough or congestion  Respiratory: denies any shortness of breath  Cardiovascular: denies chest pain or palpitations  Gastrointestinal:+ abdominal pain, denied emesis Genitourinary: denies burning with urination or urinary frequency Musculoskeletal: denies pain, decreased motor or sensation  Vital signs in last 24 hours: [min-max] current  Temp:  [97.9 F (36.6 C)-98.6 F (37 C)] 97.9 F (36.6 C) (03/08 0813) Pulse Rate:  [67-80] 71 (03/08 0813) Resp:  [16-18] 16 (03/08 0813) BP: (101-171)/(76-96) 107/77 (03/08 0813) SpO2:  [94 %-98 %] 95 % (03/08 0813)     Height: 5\' 8"  (172.7 cm) Weight: 65.3 kg BMI (Calculated): 21.9   Intake/Output last 2 shifts:  03/07 0701 - 03/08 0700 In: 2530.4 [P.O.:540; I.V.:1990.4] Out: 1000 [Urine:1000]   Physical Exam:  Constitutional: alert, cooperative and no distress  HENT: normocephalic without obvious abnormality  Eyes: PERRL, EOM's grossly intact and symmetric  Respiratory: breathing non-labored at rest  Cardiovascular: regular rate and sinus rhythm  Gastrointestinal: Soft, she does have persistent/consistent tenderness to the epigastrium diminishing to the right upper quadrant, non-distended, elsewhere without rebound/guarding Musculoskeletal: no edema or wounds, motor and  sensation grossly intact, NT    Labs:  CBC Latest Ref Rng & Units 05/05/2020 05/04/2020 05/02/2020  WBC 4.0 - 10.5 K/uL 10.3 12.1(H) 10.1  Hemoglobin 12.0 - 15.0 g/dL 13.0 13.2 14.6  Hematocrit 36.0 - 46.0 % 36.9 39.3 41.9  Platelets 150 - 400 K/uL 299 297 327   CMP Latest Ref Rng & Units 05/05/2020 05/04/2020 05/02/2020  Glucose 70 - 99 mg/dL 92 89 104(H)  BUN 6 - 20 mg/dL 9 11 20   Creatinine 0.44 - 1.00 mg/dL 0.64 0.75 0.72  Sodium 135 - 145 mmol/L 137 137 137  Potassium 3.5 - 5.1 mmol/L 3.6 3.9 3.5  Chloride 98 - 111 mmol/L 97(L) 96(L) 99  CO2 22 - 32 mmol/L 29 28 28   Calcium 8.9 - 10.3 mg/dL 8.7(L) 8.9 8.8(L)  Total Protein 6.5 - 8.1 g/dL - 6.5 6.4(L)  Total Bilirubin 0.3 - 1.2 mg/dL - 1.1 0.8  Alkaline Phos 38 - 126 U/L - 110 131(H)  AST 15 - 41 U/L - 13(L) 16  ALT 0 - 44 U/L - 12 17     Imaging studies: No new pertinent imaging studies   Assessment/Plan: (ICD-10's: K107.609) 55 y.o. female with resolving pSBO vs gastroenteritis vs exacerbation of possible underlying gastroparesis, now with return of bowel function. I suspect her abdominal pain may be more likely musculoskeletal in nature.     - Continue CLD for now;   - Appreciate GI involvement and planned evaluation  - Monitor abdominal examination; on-going bowel function             - Pain control prn; antiemetics prn             - No emergent interventions; she understands that if  she were to fail to resolve with conservative measure we would need to consider surgical intervention             - Serial KUBs as needed             - Mobilization encouraged              - Further management per primary service; we will follow    All of the above findings and recommendations were discussed with the patient, patient's family (sister at bedside), and the medical team, and all of patient's and family's questions were answered to their expressed satisfaction.  -- Ronny Bacon, MD, FACS [facility] 05/06/2020,  11:50 AM

## 2020-05-06 NOTE — Telephone Encounter (Signed)
Patient [name] and wants you to call her back.

## 2020-05-06 NOTE — Progress Notes (Signed)
Patient given miralax bowel prep.  Patient had an episode of emesis upon drinking one cup and refused to drink anymore of it.  She states that we need to try something else.  Dr Sloan Leiter and Dr Bonna Gains notified with no new orders at this time.

## 2020-05-06 NOTE — Progress Notes (Signed)
PROGRESS NOTE    [name]  YWV:[id number] DOB: 18-Aug-1965 DOA: 05/01/2020 PCP: [name], PA-C    Brief Narrative:  55 year old female with history of COPD, chronic pain syndrome, hypertension, GERD presented to the ER with 2 days of abdominal pain, nausea and vomiting.  In the emergency room she was hemodynamically stable.  She was found to have partial small bowel obstruction.  Admitted with surgical consultation. Patient does have history of traumatic brain injury and significant debility.   Assessment & Plan:   Principal Problem:   SBO (small bowel obstruction) (HCC) Active Problems:   GERD (gastroesophageal reflux disease)   Abdominal pain   Hypertension   Chronic obstructive pulmonary disease (HCC)   Tobacco use   Leukocytosis  Partial small bowel obstruction: Patient is clinically improving.  Use of narcotic pain medication and epigastric pain makes things worse.  Tolerating clears.  Ongoing epigastric pain remains the issue. Electrolytes are stable. Bowel obstruction resolved. Patient with ongoing epigastric pain and is scheduled outpatient work-up.  With severe symptoms, seen by GI and planning for EGD and colonoscopy tomorrow.  We will keep on clears.  She is on PPI twice a day.   Essential hypertension: Blood pressure stable after resuming home medications. IV Vasotec as needed to keep systolic blood pressure less than 160.    Chronic pain syndrome:  On oxycodone at home.  Will resume on discharge.  COPD:  Stable.  Bronchodilators as needed.  GERD:  On Protonix.  Continue.  IV while NPO. With ongoing abdominal pain, increased dose of PPI.  Seen by GI.    DVT prophylaxis: SCDs   Code Status: Full code Family Communication: Patient's sister at bedside. Disposition Plan: Status is: Inpatient  Remains inpatient appropriate because:IV treatments appropriate due to intensity of illness or inability to take PO   Dispo: The patient is from: Home               Anticipated d/c is to: Home              Patient currently is not medically stable to d/c.   Difficult to place patient No         Consultants:   Surgery  GI  Procedures:   None  Antimicrobials:   None   Subjective: Patient seen and examined.  Still has epigastric pain she is trying to space out pain medications.  Denies any nausea vomiting.  Tolerating clears.  Passing flatus but no BM yet.  Moving around and walking in the hallway with the sister.   Objective: Vitals:   05/05/20 2206 05/06/20 0037 05/06/20 0451 05/06/20 0813  BP: (!) 171/89 115/84 101/76 107/77  Pulse: 80 75 70 71  Resp: 18 16 18 16   Temp: 98.5 F (36.9 C) 98.6 F (37 C) 98.5 F (36.9 C) 97.9 F (36.6 C)  TempSrc:  Oral Oral   SpO2: 98% 95% 94% 95%  Weight:      Height:        Intake/Output Summary (Last 24 hours) at 05/06/2020 0945 Last data filed at 05/06/2020 0500 Gross per 24 hour  Intake 2530.42 ml  Output 1000 ml  Net 1530.42 ml   Filed Weights   05/01/20 1419  Weight: 65.3 kg    Examination:  General: Looks comfortable.  Without any distress.  Anxious on abdominal exam.  Some voluntary guarding at epigastrium. Cardiovascular: S1-S2 normal.  No added sounds Respiratory: Bilateral clear.  No added sounds Gastrointestinal: Soft.  No residual  guarding.  Bowel sounds present.  Mild tenderness at the epigastrium. Ext: No cyanosis or edema. Neuro: No focal deficits.   Data Reviewed: I have personally reviewed following labs and imaging studies  CBC: Recent Labs  Lab 05/01/20 1424 05/02/20 0341 05/04/20 0446 05/05/20 0554  WBC 14.6* 10.1 12.1* 10.3  NEUTROABS  --  6.0 9.4* 6.9  HGB 16.8* 14.6 13.2 13.0  HCT 48.9* 41.9 39.3 36.9  MCV 86.2 86.6 88.3 85.6  PLT 415* 327 297 638   Basic Metabolic Panel: Recent Labs  Lab 05/01/20 1424 05/02/20 0341 05/04/20 0446 05/05/20 0554  NA 134* 137 137 137  K 3.8 3.5 3.9 3.6  CL 96* 99 96* 97*  CO2 24 28 28 29   GLUCOSE  119* 104* 89 92  BUN 19 20 11 9   CREATININE 0.81 0.72 0.75 0.64  CALCIUM 9.7 8.8* 8.9 8.7*  MG 2.4 2.0 1.8 2.2  PHOS  --   --  3.0  --    GFR: Estimated Creatinine Clearance: 82 mL/min (by C-G formula based on SCr of 0.64 mg/dL). Liver Function Tests: Recent Labs  Lab 05/01/20 1424 05/02/20 0341 05/04/20 0446  AST 19 16 13*  ALT 21 17 12   ALKPHOS 172* 131* 110  BILITOT 0.9 0.8 1.1  PROT 8.3* 6.4* 6.5  ALBUMIN 4.6 3.5 3.3*   Recent Labs  Lab 05/01/20 1424  LIPASE 28   No results for input(s): AMMONIA in the last 168 hours. Coagulation Profile: No results for input(s): INR, PROTIME in the last 168 hours. Cardiac Enzymes: No results for input(s): CKTOTAL, CKMB, CKMBINDEX, TROPONINI in the last 168 hours. BNP (last 3 results) No results for input(s): PROBNP in the last 8760 hours. HbA1C: No results for input(s): HGBA1C in the last 72 hours. CBG: No results for input(s): GLUCAP in the last 168 hours. Lipid Profile: No results for input(s): CHOL, HDL, LDLCALC, TRIG, CHOLHDL, LDLDIRECT in the last 72 hours. Thyroid Function Tests: No results for input(s): TSH, T4TOTAL, FREET4, T3FREE, THYROIDAB in the last 72 hours. Anemia Panel: No results for input(s): VITAMINB12, FOLATE, FERRITIN, TIBC, IRON, RETICCTPCT in the last 72 hours. Sepsis Labs: No results for input(s): PROCALCITON, LATICACIDVEN in the last 168 hours.  Recent Results (from the past 240 hour(s))  Resp Panel by RT-PCR (Flu A&B, Covid) Nasopharyngeal Swab     Status: None   Collection Time: 05/01/20  2:24 PM   Specimen: Nasopharyngeal Swab; Nasopharyngeal(NP) swabs in vial transport medium  Result Value Ref Range Status   SARS Coronavirus 2 by RT PCR NEGATIVE NEGATIVE Final    Comment: (NOTE) SARS-CoV-2 target nucleic acids are NOT DETECTED.  The SARS-CoV-2 RNA is generally detectable in upper respiratory specimens during the acute phase of infection. The lowest concentration of SARS-CoV-2 viral copies this  assay can detect is 138 copies/mL. A negative result does not preclude SARS-Cov-2 infection and should not be used as the sole basis for treatment or other patient management decisions. A negative result may occur with  improper specimen collection/handling, submission of specimen other than nasopharyngeal swab, presence of viral mutation(s) within the areas targeted by this assay, and inadequate number of viral copies(<138 copies/mL). A negative result must be combined with clinical observations, patient history, and epidemiological information. The expected result is Negative.  Fact Sheet for Patients:  EntrepreneurPulse.com.au  Fact Sheet for Healthcare Providers:  IncredibleEmployment.be  This test is no t yet approved or cleared by the [place] FDA and  has been authorized for detection and/or  diagnosis of SARS-CoV-2 by FDA under an Emergency Use Authorization (EUA). This EUA will remain  in effect (meaning this test can be used) for the duration of the COVID-19 declaration under Section 564(b)(1) of the Act, 21 U.S.C.section 360bbb-3(b)(1), unless the authorization is terminated  or revoked sooner.       Influenza A by PCR NEGATIVE NEGATIVE Final   Influenza B by PCR NEGATIVE NEGATIVE Final    Comment: (NOTE) The Xpert Xpress SARS-CoV-2/FLU/RSV plus assay is intended as an aid in the diagnosis of influenza from Nasopharyngeal swab specimens and should not be used as a sole basis for treatment. Nasal washings and aspirates are unacceptable for Xpert Xpress SARS-CoV-2/FLU/RSV testing.  Fact Sheet for Patients: EntrepreneurPulse.com.au  Fact Sheet for Healthcare Providers: IncredibleEmployment.be  This test is not yet approved or cleared by the [place] FDA and has been authorized for detection and/or diagnosis of SARS-CoV-2 by FDA under an Emergency Use Authorization (EUA). This EUA will  remain in effect (meaning this test can be used) for the duration of the COVID-19 declaration under Section 564(b)(1) of the Act, 21 U.S.C. section 360bbb-3(b)(1), unless the authorization is terminated or revoked.  Performed at [facility], [address], [place], [place] [address]          Radiology Studies: No results found.      Scheduled Meds: . hydrocortisone cream   Topical BID  . lisinopril  10 mg Oral Daily  . pantoprazole (PROTONIX) IV  40 mg Intravenous Q12H  . umeclidinium bromide  1 puff Inhalation Daily   Continuous Infusions: . lactated ringers 100 mL/hr at 05/06/20 0424     LOS: 5 days    Time spent: 25 minutes    Barb Merino, MD [facility] Pager [phone]

## 2020-05-07 ENCOUNTER — Inpatient Hospital Stay: Payer: 59 | Admitting: Anesthesiology

## 2020-05-07 ENCOUNTER — Encounter
Admission: EM | Disposition: A | Discharge status: Home / Self Care | Payer: Self-pay | Source: Home / Self Care | Attending: Internal Medicine

## 2020-05-07 DIAGNOSIS — K2100 Gastro-esophageal reflux disease with esophagitis, without bleeding: Secondary | ICD-10-CM

## 2020-05-07 DIAGNOSIS — K560 Paralytic ileus: Secondary | ICD-10-CM

## 2020-05-07 DIAGNOSIS — K56600 Partial intestinal obstruction, unspecified as to cause: Secondary | ICD-10-CM | POA: Diagnosis not present

## 2020-05-07 DIAGNOSIS — I10 Essential (primary) hypertension: Secondary | ICD-10-CM | POA: Diagnosis not present

## 2020-05-07 DIAGNOSIS — R194 Change in bowel habit: Secondary | ICD-10-CM

## 2020-05-07 DIAGNOSIS — K2210 Ulcer of esophagus without bleeding: Secondary | ICD-10-CM | POA: Diagnosis not present

## 2020-05-07 DIAGNOSIS — R10811 Right upper quadrant abdominal tenderness: Secondary | ICD-10-CM | POA: Diagnosis not present

## 2020-05-07 DIAGNOSIS — K56609 Unspecified intestinal obstruction, unspecified as to partial versus complete obstruction: Secondary | ICD-10-CM | POA: Diagnosis not present

## 2020-05-07 HISTORY — PX: COLONOSCOPY WITH PROPOFOL: SHX5780

## 2020-05-07 HISTORY — PX: ESOPHAGOGASTRODUODENOSCOPY (EGD) WITH PROPOFOL: SHX5813

## 2020-05-07 LAB — KOH PREP: KOH Prep: NONE SEEN

## 2020-05-07 SURGERY — ESOPHAGOGASTRODUODENOSCOPY (EGD) WITH PROPOFOL
Anesthesia: General

## 2020-05-07 MED ORDER — MIDAZOLAM HCL 2 MG/2ML IJ SOLN
INTRAMUSCULAR | Status: AC
  Filled 2020-05-07: qty 2

## 2020-05-07 MED ORDER — LIDOCAINE 2% (20 MG/ML) 5 ML SYRINGE
INTRAMUSCULAR | Status: DC | PRN
  Administered 2020-05-07: 25 mg via INTRAVENOUS

## 2020-05-07 MED ORDER — PROPOFOL 500 MG/50ML IV EMUL
INTRAVENOUS | Status: DC | PRN
  Administered 2020-05-07: 120 ug/kg/min via INTRAVENOUS

## 2020-05-07 MED ORDER — PANTOPRAZOLE SODIUM 40 MG PO TBEC
40.0000 mg | DELAYED_RELEASE_TABLET | Freq: Two times a day (BID) | ORAL | Status: DC
  Administered 2020-05-07 – 2020-05-10 (×6): 40 mg via ORAL
  Filled 2020-05-07 (×6): qty 1

## 2020-05-07 MED ORDER — MIDAZOLAM HCL 5 MG/5ML IJ SOLN
INTRAMUSCULAR | Status: DC | PRN
  Administered 2020-05-07: 2 mg via INTRAVENOUS

## 2020-05-07 MED ORDER — PROPOFOL 10 MG/ML IV BOLUS
INTRAVENOUS | Status: DC | PRN
  Administered 2020-05-07 (×3): 50 mg via INTRAVENOUS

## 2020-05-07 MED ORDER — GLYCOPYRROLATE 0.2 MG/ML IJ SOLN
INTRAMUSCULAR | Status: DC | PRN
  Administered 2020-05-07: .2 mg via INTRAVENOUS

## 2020-05-07 NOTE — Op Note (Signed)
[facility] Gastroenterology Patient Name: [name] Procedure Date: 05/07/2020 10:32 AM MRN: [id number] Account #: [id number] Date of Birth: Feb 20, 1966 Admit Type: Outpatient Age: 55 Room: [facility] ENDO ROOM 4 Gender: Female Note Status: Finalized Procedure:             Colonoscopy Indications:           Abnormal CT of the GI tract, h/o partial small bowel                         obstruction Providers:             Lin Landsman MD, MD Medicines:             General Anesthesia Complications:         No immediate complications. Estimated blood loss: None. Procedure:             Pre-Anesthesia Assessment:                        - Prior to the procedure, a History and Physical was                         performed, and patient medications and allergies were                         reviewed. The patient is competent. The risks and                         benefits of the procedure and the sedation options and                         risks were discussed with the patient. All questions                         were answered and informed consent was obtained.                         Patient identification and proposed procedure were                         verified by the physician, the nurse, the                         anesthesiologist, the anesthetist and the technician                         in the pre-procedure area in the procedure room in the                         endoscopy suite. Mental Status Examination: alert and                         oriented. Airway Examination: normal oropharyngeal                         airway and neck mobility. Respiratory Examination:                         clear to auscultation. CV Examination: normal.  Prophylactic Antibiotics: The patient does not require                         prophylactic antibiotics. Prior Anticoagulants: The                         patient has taken no previous anticoagulant or                          antiplatelet agents. ASA Grade Assessment: III - A                         patient with severe systemic disease. After reviewing                         the risks and benefits, the patient was deemed in                         satisfactory condition to undergo the procedure. The                         anesthesia plan was to use general anesthesia.                         Immediately prior to administration of medications,                         the patient was re-assessed for adequacy to receive                         sedatives. The heart rate, respiratory rate, oxygen                         saturations, blood pressure, adequacy of pulmonary                         ventilation, and response to care were monitored                         throughout the procedure. The physical status of the                         patient was re-assessed after the procedure.                        After obtaining informed consent, the colonoscope was                         passed under direct vision. Throughout the procedure,                         the patient's blood pressure, pulse, and oxygen                         saturations were monitored continuously. The                         Colonoscope was introduced through the anus and  advanced to the 10 cm into the ileum. The colonoscopy                         was performed without difficulty. The patient                         tolerated the procedure well. The quality of the bowel                         preparation was inadequate. Findings:      The perianal and digital rectal examinations were normal. Pertinent       negatives include normal sphincter tone and no palpable rectal lesions.      The terminal ileum appeared normal.      Normal mucosa was found in the entire colon.      The retroflexed view of the distal rectum and anal verge was normal and       showed no anal or rectal  abnormalities. Impression:            - Preparation of the colon was inadequate.                        - The examined portion of the ileum was normal.                        - Normal mucosa in the entire examined colon.                        - The distal rectum and anal verge are normal on                         retroflexion view.                        - No specimens collected. Recommendation:        - Return patient to hospital ward for ongoing care.                        - Resume regular diet today.                        - Continue present medications.                        - Repeat colonoscopy at appointment to be scheduled                         for screening purposes as outpatient. Procedure Code(s):     --- Professional ---                        [phone], Colonoscopy, flexible; diagnostic, including                         collection of specimen(s) by brushing or washing, when                         performed (separate procedure) Diagnosis Code(s):     --- Professional ---  R93.3, Abnormal findings on diagnostic imaging of                         other parts of digestive tract CPT copyright 2019 American Medical Association. All rights reserved. The codes documented in this report are preliminary and upon coder review may  be revised to meet current compliance requirements. Dr. Ulyess Mort Lin Landsman MD, MD 05/07/2020 11:24:13 AM This report has been signed electronically. Number of Addenda: 0 Note Initiated On: 05/07/2020 10:32 AM Scope Withdrawal Time: 0 hours 8 minutes 40 seconds  Total Procedure Duration: 0 hours 11 minutes 33 seconds  Estimated Blood Loss:  Estimated blood loss: none.      [facility]

## 2020-05-07 NOTE — Anesthesia Postprocedure Evaluation (Signed)
Anesthesia Post Note  Patient: Kaytelyn C Lingafelter  Procedure(s) Performed: ESOPHAGOGASTRODUODENOSCOPY (EGD) WITH PROPOFOL (N/A ) COLONOSCOPY WITH PROPOFOL (N/A )  Patient location during evaluation: Endoscopy Anesthesia Type: General Level of consciousness: awake and alert and oriented Pain management: pain level controlled Vital Signs Assessment: post-procedure vital signs reviewed and stable Respiratory status: spontaneous breathing, nonlabored ventilation and respiratory function stable Cardiovascular status: blood pressure returned to baseline and stable Postop Assessment: no signs of nausea or vomiting Anesthetic complications: no   No complications documented.   Last Vitals:  Vitals:   05/07/20 1134 05/07/20 1213  BP: (!) 141/85 (!) 172/96  Pulse: 72 67  Resp: 16 18  Temp:  36.6 C  SpO2: 97% 97%    Last Pain:  Vitals:   05/07/20 1213  TempSrc: Oral  PainSc:                  Nikoletta Varma

## 2020-05-07 NOTE — Telephone Encounter (Signed)
Patient inpatient at [facility]

## 2020-05-07 NOTE — Progress Notes (Signed)
[facility] SURGICAL PROGRESS NOTE (cpt [phone])  Hospital Day(s): 6   Interval History: Patient seen and examined, no acute events or new complaints overnight. Patient reports she is still have upper abdominal pain which is relatively unchanged. She denies fever, chills, nausea. No new labs or imaging this morning. She had been tolerating CLD yesterday but is NPO for EGD/Colonoscopy this afternoon with GI. She did have a BM recorded but described this as "pushing out water."   Review of Systems:  Constitutional: denies fever, chills  HEENT: denies cough or congestion  Respiratory: denies any shortness of breath  Cardiovascular: denies chest pain or palpitations  Gastrointestinal: + abdominal pain, denied N/V, or diarrhea/and bowel function as per interval history Genitourinary: denies burning with urination or urinary frequency  Vital signs in last 24 hours: [min-max] current  Temp:  [97.5 F (36.4 C)-98.2 F (36.8 C)] 97.5 F (36.4 C) (03/09 0508) Pulse Rate:  [65-78] 65 (03/09 0508) Resp:  [16-20] 20 (03/09 0508) BP: (107-154)/(73-98) 126/78 (03/09 0508) SpO2:  [93 %-99 %] 93 % (03/09 0508)     Height: 5\' 8"  (172.7 cm) Weight: 65.3 kg BMI (Calculated): 21.9   Intake/Output last 2 shifts:  03/08 0701 - 03/09 0700 In: 600 [P.O.:600] Out: -    Physical Exam:  Constitutional: alert, cooperative and no distress  HENT: normocephalic without obvious abnormality  Eyes: PERRL, EOM's grossly intact and symmetric  Respiratory: breathing non-labored at rest  Cardiovascular: regular rate and sinus rhythm  Gastrointestinal: Soft, she does have persistent/consistent tenderness to the epigastrium diminishing to the right upper quadrant, non-distended, elsewhere without rebound/guarding Musculoskeletal: no edema or wounds, motor and sensation grossly intact, NT    Labs:  CBC Latest Ref Rng & Units 05/05/2020 05/04/2020 05/02/2020  WBC 4.0 - 10.5 K/uL 10.3 12.1(H) 10.1   Hemoglobin 12.0 - 15.0 g/dL 13.0 13.2 14.6  Hematocrit 36.0 - 46.0 % 36.9 39.3 41.9  Platelets 150 - 400 K/uL 299 297 327   CMP Latest Ref Rng & Units 05/05/2020 05/04/2020 05/02/2020  Glucose 70 - 99 mg/dL 92 89 104(H)  BUN 6 - 20 mg/dL 9 11 20   Creatinine 0.44 - 1.00 mg/dL 0.64 0.75 0.72  Sodium 135 - 145 mmol/L 137 137 137  Potassium 3.5 - 5.1 mmol/L 3.6 3.9 3.5  Chloride 98 - 111 mmol/L 97(L) 96(L) 99  CO2 22 - 32 mmol/L 29 28 28   Calcium 8.9 - 10.3 mg/dL 8.7(L) 8.9 8.8(L)  Total Protein 6.5 - 8.1 g/dL - 6.5 6.4(L)  Total Bilirubin 0.3 - 1.2 mg/dL - 1.1 0.8  Alkaline Phos 38 - 126 U/L - 110 131(H)  AST 15 - 41 U/L - 13(L) 16  ALT 0 - 44 U/L - 12 17     Imaging studies: No new pertinent imaging studies   Assessment/Plan: (ICD-10's: K56.609) 55 y.o.femalewith persistent abdominal pain and lack of consistent bowel function of unclear etiology, though to be pSBO vs gastroenteritis vs exacerbation of possible underlying gastroparesis, now with return of bowel function. I suspect her abdominal pain may be more likely musculoskeletal in nature   - NPO for planned procedure  - Appreciate GI involvement and planned evaluation             - Monitor abdominal examination; on-going bowel function - Pain control prn; antiemetics prn - No emergent interventions; she understands that if she were to fail to resolve with conservative measure we would need to consider surgical intervention - Serial KUBs as needed -  Mobilization encouraged - Further management per primary service; we will follow  All of the above findings and recommendations were discussed with the patient, and the medical team, and all of patient's questions were answered to her expressed satisfaction.  -- Edison Simon, PA-C [facility] 05/07/2020, 7:14 AM [phone] M-F: 7am - 4pm

## 2020-05-07 NOTE — Plan of Care (Signed)
Pt is scheduled for Endoscopy and Colonoscopy today 1100. MD ordered Miralax. Pt had small water bm this am. RN did not see per patient report. Pt continues to take Dilaudid every 4 hours and Zofran approx every 8 hours. Will continue to monitor. Problem: Education: Goal: Knowledge of General Education information will improve Description: Including pain rating scale, medication(s)/side effects and non-pharmacologic comfort measures Outcome: Progressing   Problem: Health Behavior/Discharge Planning: Goal: Ability to manage health-related needs will improve Outcome: Progressing   Problem: Clinical Measurements: Goal: Ability to maintain clinical measurements within normal limits will improve Outcome: Progressing Goal: Will remain free from infection Outcome: Progressing Goal: Diagnostic test results will improve Outcome: Progressing Goal: Respiratory complications will improve Outcome: Progressing Goal: Cardiovascular complication will be avoided Outcome: Progressing   Problem: Activity: Goal: Risk for activity intolerance will decrease Outcome: Progressing   Problem: Nutrition: Goal: Adequate nutrition will be maintained Outcome: Progressing   Problem: Coping: Goal: Level of anxiety will decrease Outcome: Progressing   Problem: Elimination: Goal: Will not experience complications related to bowel motility Outcome: Progressing Goal: Will not experience complications related to urinary retention Outcome: Progressing   Problem: Pain Managment: Goal: General experience of comfort will improve Outcome: Progressing   Problem: Safety: Goal: Ability to remain free from injury will improve Outcome: Progressing   Problem: Skin Integrity: Goal: Risk for impaired skin integrity will decrease Outcome: Progressing
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
## 2020-05-07 NOTE — Progress Notes (Signed)
[facility] PROGRESS NOTE    Progress Note  [name]  VWU:[id number] DOB: 1966-02-23 DOA: 05/01/2020 PCP: [name], PA-C     Brief Narrative:   [name] is an 55 y.o. female past medical history of COPD, chronic pain syndrome, history of traumatic brain injury essential hypertension comes into the ED with 2 days of abdominal pain nausea and vomiting in the emergency room she was hemodynamically stable was found to have a small bowel obstruction  Significant Events: 05/01/2020 admission  Significant studies: 05/01/2020 CT scan of the abdomen and pelvis consistent with a small bowel obstruction. 06/02/2020 abdominal x-ray showed improved partial small bowel obstruction NG tube in place.  Next line 05/03/2022 was unremarkable.  Antibiotics: None  Microbiology data: Blood culture:  Procedures: 05/07/2020 EGD that showed normal duodenum bulb second portion of the duodenum, some erythematous mucosa of the gastric body and an esophageal ulcer nonbleeding with no stigmata of bleeding. 05/07/2020 colonoscopy there was an inadequate preparation showed normal ileum relatively normal mucosa.  Assessment/Plan:   SBO (small bowel obstruction) ([place]) Significantly improved tolerating her clears appreciate surgery's assistance. Patient is tolerating her diet. There is severe abdominal pain GI was consulted recommended an endoscopy that showed an esophageal ulcer she was started empirically on a PPI twice a day. Awaiting GI further recommendations.  Essential hypertension: Blood pressure is stable she has been resume her home medications.  Chronic pain syndrome: Continue oxycodone.  COPD: Stable.  GERD: Continue PPI.   DVT prophylaxis: scd Family Communication:none Status is: Inpatient  Remains inpatient appropriate because:Hemodynamically unstable   Dispo: The patient is from: Home              Anticipated d/c is to: Home              Patient currently is not medically  stable to d/c.   Difficult to place patient No     Code Status:     Code Status Orders  (From admission, onward)         Start     Ordered   05/01/20 1758  Full code  Continuous        05/01/20 1757        Code Status History    Date Active Date Inactive Code Status Order ID Comments User Context   01/24/2019 0304 01/28/2019 2305 Full Code [id number]  Fritzi Mandes, MD ED   Advance Care Planning Activity        IV Access:    Peripheral IV   Procedures and diagnostic studies:   No results found.   Medical Consultants:    None.   Subjective:    [name] denies any abdominal pain related she is scared of eating.  Objective:    Vitals:   05/07/20 1009 05/07/20 1124 05/07/20 1134 05/07/20 1213  BP: (!) 144/96 127/86 (!) 141/85 (!) 172/96  Pulse: 67 86 72 67  Resp: 20 13 16 18   Temp: (!) 97.3 F (36.3 C) (!) 96.9 F (36.1 C)  97.9 F (36.6 C)  TempSrc: Temporal Temporal  Oral  SpO2: 96% 99% 97% 97%  Weight:      Height:       SpO2: 97 %   Intake/Output Summary (Last 24 hours) at 05/07/2020 1301 Last data filed at 05/07/2020 1051 Gross per 24 hour  Intake 800 ml  Output --  Net 800 ml   Filed Weights   05/01/20 1419  Weight: 65.3 kg  Exam: General exam: In no acute distress. Respiratory system: Good air movement and clear to auscultation. Cardiovascular system: S1 & S2 heard, RRR. No JVD. Gastrointestinal system: Abdomen is nondistended, soft and nontender.  Skin: No rashes, lesions or ulcers Psychiatry: Judgement and insight appear normal. Mood & affect appropriate.    Data Reviewed:    Labs: Basic Metabolic Panel: Recent Labs  Lab 05/01/20 1424 05/02/20 0341 05/04/20 0446 05/05/20 0554  NA 134* 137 137 137  K 3.8 3.5 3.9 3.6  CL 96* 99 96* 97*  CO2 24 28 28 29   GLUCOSE 119* 104* 89 92  BUN 19 20 11 9   CREATININE 0.81 0.72 0.75 0.64  CALCIUM 9.7 8.8* 8.9 8.7*  MG 2.4 2.0 1.8 2.2  PHOS  --   --  3.0  --     GFR Estimated Creatinine Clearance: 82 mL/min (by C-G formula based on SCr of 0.64 mg/dL). Liver Function Tests: Recent Labs  Lab 05/01/20 1424 05/02/20 0341 05/04/20 0446  AST 19 16 13*  ALT 21 17 12   ALKPHOS 172* 131* 110  BILITOT 0.9 0.8 1.1  PROT 8.3* 6.4* 6.5  ALBUMIN 4.6 3.5 3.3*   Recent Labs  Lab 05/01/20 1424  LIPASE 28   No results for input(s): AMMONIA in the last 168 hours. Coagulation profile No results for input(s): INR, PROTIME in the last 168 hours. COVID-19 Labs  No results for input(s): DDIMER, FERRITIN, LDH, CRP in the last 72 hours.  Lab Results  Component Value Date   SARSCOV2NAA NEGATIVE 05/01/2020   Industry NEGATIVE 01/24/2019    CBC: Recent Labs  Lab 05/01/20 1424 05/02/20 0341 05/04/20 0446 05/05/20 0554  WBC 14.6* 10.1 12.1* 10.3  NEUTROABS  --  6.0 9.4* 6.9  HGB 16.8* 14.6 13.2 13.0  HCT 48.9* 41.9 39.3 36.9  MCV 86.2 86.6 88.3 85.6  PLT 415* 327 297 299   Cardiac Enzymes: No results for input(s): CKTOTAL, CKMB, CKMBINDEX, TROPONINI in the last 168 hours. BNP (last 3 results) No results for input(s): PROBNP in the last 8760 hours. CBG: No results for input(s): GLUCAP in the last 168 hours. D-Dimer: No results for input(s): DDIMER in the last 72 hours. Hgb A1c: No results for input(s): HGBA1C in the last 72 hours. Lipid Profile: No results for input(s): CHOL, HDL, LDLCALC, TRIG, CHOLHDL, LDLDIRECT in the last 72 hours. Thyroid function studies: No results for input(s): TSH, T4TOTAL, T3FREE, THYROIDAB in the last 72 hours.  Invalid input(s): FREET3 Anemia work up: No results for input(s): VITAMINB12, FOLATE, FERRITIN, TIBC, IRON, RETICCTPCT in the last 72 hours. Sepsis Labs: Recent Labs  Lab 05/01/20 1424 05/02/20 0341 05/04/20 0446 05/05/20 0554  WBC 14.6* 10.1 12.1* 10.3   Microbiology Recent Results (from the past 240 hour(s))  Resp Panel by RT-PCR (Flu A&B, Covid) Nasopharyngeal Swab     Status: None    Collection Time: 05/01/20  2:24 PM   Specimen: Nasopharyngeal Swab; Nasopharyngeal(NP) swabs in vial transport medium  Result Value Ref Range Status   SARS Coronavirus 2 by RT PCR NEGATIVE NEGATIVE Final    Comment: (NOTE) SARS-CoV-2 target nucleic acids are NOT DETECTED.  The SARS-CoV-2 RNA is generally detectable in upper respiratory specimens during the acute phase of infection. The lowest concentration of SARS-CoV-2 viral copies this assay can detect is 138 copies/mL. A negative result does not preclude SARS-Cov-2 infection and should not be used as the sole basis for treatment or other patient management decisions. A negative result may occur with  improper specimen collection/handling, submission of specimen other than nasopharyngeal swab, presence of viral mutation(s) within the areas targeted by this assay, and inadequate number of viral copies(<138 copies/mL). A negative result must be combined with clinical observations, patient history, and epidemiological information. The expected result is Negative.  Fact Sheet for Patients:  EntrepreneurPulse.com.au  Fact Sheet for Healthcare Providers:  IncredibleEmployment.be  This test is no t yet approved or cleared by the [place] FDA and  has been authorized for detection and/or diagnosis of SARS-CoV-2 by FDA under an Emergency Use Authorization (EUA). This EUA will remain  in effect (meaning this test can be used) for the duration of the COVID-19 declaration under Section 564(b)(1) of the Act, 21 U.S.C.section 360bbb-3(b)(1), unless the authorization is terminated  or revoked sooner.       Influenza A by PCR NEGATIVE NEGATIVE Final   Influenza B by PCR NEGATIVE NEGATIVE Final    Comment: (NOTE) The Xpert Xpress SARS-CoV-2/FLU/RSV plus assay is intended as an aid in the diagnosis of influenza from Nasopharyngeal swab specimens and should not be used as a sole basis for treatment.  Nasal washings and aspirates are unacceptable for Xpert Xpress SARS-CoV-2/FLU/RSV testing.  Fact Sheet for Patients: EntrepreneurPulse.com.au  Fact Sheet for Healthcare Providers: IncredibleEmployment.be  This test is not yet approved or cleared by the [place] FDA and has been authorized for detection and/or diagnosis of SARS-CoV-2 by FDA under an Emergency Use Authorization (EUA). This EUA will remain in effect (meaning this test can be used) for the duration of the COVID-19 declaration under Section 564(b)(1) of the Act, 21 U.S.C. section 360bbb-3(b)(1), unless the authorization is terminated or revoked.  Performed at [facility], [address] [place], [place] [phone]   KOH prep     Status: None   Collection Time: 05/07/20 11:00 AM   Specimen: Bronchial Brush  Result Value Ref Range Status   Specimen Description BRONCHIAL BRUSHING  Final   Special Requests NONE  Final   KOH Prep   Final    NO YEAST OR FUNGAL ELEMENTS SEEN Performed at [facility], [address] [place], [place] [address]    Report Status 05/07/2020 FINAL  Final     Medications:   . feeding supplement  1 Container Oral TID BM  . hydrocortisone cream   Topical BID  . lisinopril  10 mg Oral Daily  . pantoprazole  40 mg Oral BID AC  . polyethylene glycol  17 g Oral BID  . umeclidinium bromide  1 puff Inhalation Daily   Continuous Infusions: . sodium chloride 10 mL/hr at 05/07/20 1036  . lactated ringers 100 mL/hr at 05/07/20 0143      LOS: 6 days   Charlynne Cousins  [facility]  05/07/2020, 1:01 PM
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
## 2020-05-07 NOTE — Op Note (Signed)
[facility] Gastroenterology Patient Name: [name] Procedure Date: 05/07/2020 10:33 AM MRN: [id number] Account #: [id number] Date of Birth: [birth year] Admit Type: Outpatient Age: 55 Room: [facility] ENDO ROOM 4 Gender: Female Note Status: Finalized Procedure:             Upper GI endoscopy Indications:           Epigastric abdominal pain Providers:             Lin Landsman MD, MD Medicines:             General Anesthesia Complications:         No immediate complications. Estimated blood loss: None. Procedure:             Pre-Anesthesia Assessment:                        - Prior to the procedure, a History and Physical was                         performed, and patient medications and allergies were                         reviewed. The patient is competent. The risks and                         benefits of the procedure and the sedation options and                         risks were discussed with the patient. All questions                         were answered and informed consent was obtained.                         Patient identification and proposed procedure were                         verified by the physician, the nurse, the                         anesthesiologist, the anesthetist and the technician                         in the pre-procedure area in the procedure room in the                         endoscopy suite. Mental Status Examination: alert and                         oriented. Airway Examination: normal oropharyngeal                         airway and neck mobility. Respiratory Examination:                         clear to auscultation. CV Examination: normal.                         Prophylactic Antibiotics: The patient does not require  prophylactic antibiotics. Prior Anticoagulants: The                         patient has taken no previous anticoagulant or                         antiplatelet agents. ASA Grade  Assessment: III - A                         patient with severe systemic disease. After reviewing                         the risks and benefits, the patient was deemed in                         satisfactory condition to undergo the procedure. The                         anesthesia plan was to use general anesthesia.                         Immediately prior to administration of medications,                         the patient was re-assessed for adequacy to receive                         sedatives. The heart rate, respiratory rate, oxygen                         saturations, blood pressure, adequacy of pulmonary                         ventilation, and response to care were monitored                         throughout the procedure. The physical status of the                         patient was re-assessed after the procedure.                        After obtaining informed consent, the endoscope was                         passed under direct vision. Throughout the procedure,                         the patient's blood pressure, pulse, and oxygen                         saturations were monitored continuously. The Endoscope                         was introduced through the mouth, and advanced to the                         second part of duodenum. The upper GI endoscopy was  accomplished without difficulty. The patient tolerated                         the procedure well. Findings:      The duodenal bulb and second portion of the duodenum were normal.       Biopsies were taken with a cold forceps for histology.      Striped severely erythematous mucosa with stigmata of recent bleeding       was found in the gastric body. Biopsies were taken with a cold forceps       for histology.      The incisura and gastric antrum were normal. Biopsies were taken with a       cold forceps for Helicobacter pylori testing.      One linear esophageal ulcer with no bleeding and  no stigmata of recent       bleeding was found in the lower third of the esophagus. The lesion was       10 mm in largest dimension. Cells for cytology were obtained by       brushing. Estimated blood loss: none. Impression:            - Normal duodenal bulb and second portion of the                         duodenum. Biopsied.                        - Erythematous mucosa in the gastric body. Biopsied.                        - Normal incisura and antrum. Biopsied.                        - Esophageal ulcer with no bleeding and no stigmata of                         recent bleeding. Cells for cytology obtained. Recommendation:        - Await pathology results.                        - Proceed with colonoscopy as scheduled                        See colonoscopy report                        - Follow an antireflux regimen.                        - Use Prilosec (omeprazole) 40 mg PO BID for 3 months.                        - Return to GI office in 1 month. Procedure Code(s):     --- Professional ---                        [phone], Esophagogastroduodenoscopy, flexible,                         transoral; with biopsy, single or multiple Diagnosis Code(s):     ---  Professional ---                        K31.89, Other diseases of stomach and duodenum                        K22.10, Ulcer of esophagus without bleeding                        R10.13, Epigastric pain CPT copyright 2019 American Medical Association. All rights reserved. The codes documented in this report are preliminary and upon coder review may  be revised to meet current compliance requirements. Dr. Ulyess Mort Lin Landsman MD, MD 05/07/2020 11:07:00 AM This report has been signed electronically. Number of Addenda: 0 Note Initiated On: 05/07/2020 10:33 AM Estimated Blood Loss:  Estimated blood loss: none.      [facility]

## 2020-05-07 NOTE — Care Management Important Message (Signed)
Important Message  Patient Details  Name: [name] MRN: [id number] Date of Birth: November 09, 1965   Medicare Important Message Given:  Yes     Juliann Pulse A Evon Lopezperez 05/07/2020, 10:48 AM

## 2020-05-07 NOTE — Transfer of Care (Signed)
Immediate Anesthesia Transfer of Care Note  Patient: [name]  Procedure(s) Performed: ESOPHAGOGASTRODUODENOSCOPY (EGD) WITH PROPOFOL (N/A ) COLONOSCOPY WITH PROPOFOL (N/A )  Patient Location: Endoscopy Unit  Anesthesia Type:General  Level of Consciousness: awake and alert   Airway & Oxygen Therapy: Patient Spontanous Breathing  Post-op Assessment: Report given to RN and Post -op Vital signs reviewed and stable  Post vital signs: Reviewed  Last Vitals:  Vitals Value Taken Time  BP    Temp    Pulse 86 05/07/20 1124  Resp 13 05/07/20 1124  SpO2 99 % 05/07/20 1124    Last Pain:  Vitals:   05/07/20 1009  TempSrc: Temporal  PainSc:       Patients Stated Pain Goal: 2 (85/99/23 4144)  Complications: No complications documented.

## 2020-05-08 DIAGNOSIS — K2210 Ulcer of esophagus without bleeding: Secondary | ICD-10-CM | POA: Diagnosis not present

## 2020-05-08 DIAGNOSIS — K560 Paralytic ileus: Secondary | ICD-10-CM | POA: Diagnosis not present

## 2020-05-08 DIAGNOSIS — K56609 Unspecified intestinal obstruction, unspecified as to partial versus complete obstruction: Secondary | ICD-10-CM | POA: Diagnosis not present

## 2020-05-08 DIAGNOSIS — F341 Dysthymic disorder: Secondary | ICD-10-CM

## 2020-05-08 DIAGNOSIS — I10 Essential (primary) hypertension: Secondary | ICD-10-CM | POA: Diagnosis not present

## 2020-05-08 DIAGNOSIS — K56600 Partial intestinal obstruction, unspecified as to cause: Secondary | ICD-10-CM | POA: Diagnosis not present

## 2020-05-08 LAB — SURGICAL PATHOLOGY

## 2020-05-08 MED ORDER — VENLAFAXINE HCL ER 75 MG PO CP24: 75.0000 mg | ORAL_CAPSULE | Freq: Every day | ORAL | Status: DC

## 2020-05-08 MED ORDER — LIDOCAINE VISCOUS HCL 2 % MT SOLN
15.0000 mL | OROMUCOSAL | Status: DC | PRN
  Administered 2020-05-08 – 2020-05-10 (×5): 15 mL via OROMUCOSAL
  Filled 2020-05-08 (×7): qty 15

## 2020-05-08 MED ORDER — VENLAFAXINE HCL ER 75 MG PO CP24: 150.0000 mg | ORAL_CAPSULE | Freq: Every day | ORAL | Status: DC

## 2020-05-08 MED ORDER — VENLAFAXINE HCL ER 75 MG PO CP24
150.0000 mg | ORAL_CAPSULE | Freq: Every day | ORAL | Status: DC
  Administered 2020-05-08 – 2020-05-10 (×3): 150 mg via ORAL
  Filled 2020-05-08 (×3): qty 2

## 2020-05-08 MED ORDER — CLONAZEPAM 0.25 MG PO TBDP: 0.5000 mg | ORAL_TABLET | Freq: Two times a day (BID) | ORAL | Status: DC | PRN

## 2020-05-08 MED ORDER — CARISOPRODOL 350 MG PO TABS
700.0000 mg | ORAL_TABLET | Freq: Two times a day (BID) | ORAL | Status: DC
  Administered 2020-05-08 – 2020-05-10 (×5): 700 mg via ORAL
  Filled 2020-05-08 (×7): qty 2

## 2020-05-08 MED ORDER — SUCRALFATE 1 GM/10ML PO SUSP
1.0000 g | Freq: Three times a day (TID) | ORAL | Status: DC
  Administered 2020-05-08 – 2020-05-10 (×7): 1 g via ORAL
  Filled 2020-05-08 (×8): qty 10

## 2020-05-08 MED ORDER — OXYCODONE HCL 5 MG PO TABS
5.0000 mg | ORAL_TABLET | Freq: Two times a day (BID) | ORAL | Status: DC
  Administered 2020-05-08 – 2020-05-10 (×5): 5 mg via ORAL
  Filled 2020-05-08 (×5): qty 1

## 2020-05-08 MED ORDER — LORAZEPAM 1 MG PO TABS
1.0000 mg | ORAL_TABLET | Freq: Four times a day (QID) | ORAL | Status: DC | PRN
  Administered 2020-05-08 – 2020-05-09 (×2): 1 mg via ORAL
  Filled 2020-05-08 (×2): qty 1

## 2020-05-08 MED ORDER — ALBUTEROL SULFATE HFA 108 (90 BASE) MCG/ACT IN AERS: 1.0000 | INHALATION_SPRAY | RESPIRATORY_TRACT | Status: DC | PRN

## 2020-05-08 NOTE — Consult Note (Signed)
[facility] Face-to-Face Psychiatry Consult   Reason for Consult: Consult for this 55 year old woman in the hospital with gastric obstruction.  Concern about depression. 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 female patient admitted with "I have not felt good in a [place] time".  HPI: Patient seen chart reviewed.  Patient's sister was also present and helped with history.  Patient reports her mood feels down all the time.  Especially the last year or so.  It is been at least that [place] since she was last taking any psychiatric medicine.  Tired much of the time.  Feels like she has nothing to enjoy in her life.  Denies any suicidal thought.  Denies any psychosis.  Currently having abdominal pain.  Even at her baseline however has been feeling bad for a [place] time.  Used to be on psychiatric medicine but dropped out of treatment.  Past Psychiatric History: Patient recalls Pristiq is being the most helpful medicine she has ever taken.  She also recalls having used as needed doses of Ativan.  Denies any history of alcohol or drug abuse.  Has seen several psychiatrists in the past but seems to have fallen out of treatment.  Patient has a history of traumatic brain injury and has irritability and anger spells as part of her syndrome as well.  Risk to Self:   Risk to Others:   Prior Inpatient Therapy:   Prior Outpatient Therapy:    Past Medical History:  Past Medical History:  Diagnosis Date  . BRBPR  (bright red blood per rectum)   . Breast mass, left   . Cancer (HCC)    CERVICAL  . Depression   . Falls    EASILY  . GERD (gastroesophageal reflux disease)   . PTSD (post-traumatic stress disorder)   . PTSD (post-traumatic stress disorder)   . Seizures ([place])   . 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 TBI (traumatic brain injury) ([place])    SKULL FX 2009  . Vertigo   . Vertigo     Past Surgical History:  Procedure Laterality Date  . abd pain    . APPENDECTOMY    . BACK SURGERY     LUMBAR FUSIONS X 2  . CATARACT EXTRACTION W/PHACO Right 01/07/2015   Procedure: CATARACT EXTRACTION PHACO AND INTRAOCULAR LENS PLACEMENT (IOC);  Surgeon: Birder Robson, MD;  Location: [facility] ORS;  Service: Ophthalmology;  Laterality: Right;  [place] 0 AP% 0 CDE 0 fluid pack lot #[id number]  . CATARACT EXTRACTION W/PHACO Left 01/28/2015   Procedure: CATARACT EXTRACTION PHACO AND INTRAOCULAR LENS PLACEMENT (IOC);  Surgeon: Birder Robson, MD;  Location: [facility] ORS;  Service: Ophthalmology;  Laterality: Left;  [place] 00:18 AP%7.1 CDE1.32 fluid pack lot # [id number]  . Cerebral Catheterization for Brain Bleed  02/02/2019   [facility]  . LEEP    . XI ROBOT ABDOMINAL PERINEAL RESECTION N/A 01/24/2019   Procedure: Robot assisted appendectomy ;  Surgeon: Ronny Bacon,  MD;  Location: [facility] ORS;  Service: General;  Laterality: N/A;   Family History:  Family History  Problem Relation Age of Onset  . Ovarian cancer Maternal Aunt        40's  . Depression Mother   . Heart disease Father   . Hypertension Father   . Diabetes Father   . Post-traumatic stress disorder Sister   . Hypertension Sister   . Diabetes Brother   . Depression Brother   . Hypertension Brother    Family Psychiatric  History: See previous Social History:  Social History   Substance and Sexual Activity  Alcohol Use No     Social History   Substance and Sexual Activity  Drug Use Yes  . Types: Marijuana     Social History   Socioeconomic History  . Marital status: Divorced    Spouse name: Not on file  . Number of children: 1  . Years of education: Not on file  . Highest education level: Associate degree: occupational, Hotel manager, or vocational program  Occupational History    Comment: DISABLED  Tobacco Use  . Smoking status: Current Every Day Smoker    Packs/day: 1.00    Types: Cigarettes  . Smokeless tobacco: Never Used  Vaping Use  . Vaping Use: Every day  . Substances: CBD  Substance and Sexual Activity  . Alcohol use: No  . Drug use: Yes    Types: Marijuana  . Sexual activity: Never  Other Topics Concern  . Not on file  Social History Narrative  . Not on file   Social Determinants of Health   Financial Resource Strain: Medium Risk  . Difficulty of Paying Living Expenses: Somewhat hard  Food Insecurity: Food Insecurity Present  . Worried About Charity fundraiser in the Last Year: Sometimes true  . Ran Out of Food in the Last Year: Sometimes true  Transportation Needs: No Transportation Needs  . Lack of Transportation (Medical): No  . Lack of Transportation (Non-Medical): No  Physical Activity: Insufficiently Active  . Days of Exercise per Week: 1 day  . Minutes of Exercise per Session: 20 min  Stress: Stress Concern Present  . Feeling of Stress : Very much  Social Connections: Socially Isolated  . Frequency of Communication with Friends and Family: More than three times a week  . Frequency of Social Gatherings with Friends and Family: Once a week  . Attends Religious Services: Never  . Active Member of Clubs or Organizations: No  . Attends Archivist Meetings: Never  . Marital Status: Divorced   Additional Social History:    Allergies:   Allergies  Allergen Reactions  . Contrast Media [Iodinated Diagnostic Agents]   . Diphenhydramine Hcl Hives and Itching  . Adhesive [Tape]     PAPER TAPE OK  . Iodine Other (See Comments)  . Prednisone Anxiety   . Sulfa Antibiotics Rash    Labs:  Results for orders placed or performed during the hospital encounter of 05/01/20 (from the past 48 hour(s))  Surgical pathology     Status: None   Collection Time: 05/07/20 10:49 AM  Result Value Ref Range   SURGICAL PATHOLOGY      SURGICAL PATHOLOGY CASE: [phone] [phone] PATIENT: [place] Surgical Pathology Report     Specimen Submitted: A. Duodenum; cbx B. Stomach, body; cbx C. Stomach, antrum; cbx  Clinical History: Epigastric pain, partial small bowel obstruction. Esophageal erythema, esophagitis, normal colon    DIAGNOSIS: A. DUODENUM; COLD BIOPSY: - ENTERIC  MUCOSA WITH PRESERVED VILLOUS ARCHITECTURE AND NO SIGNIFICANT HISTOPATHOLOGIC CHANGE. - NEGATIVE FOR FEATURES OF CELIAC, DYSPLASIA, AND MALIGNANCY.  B. STOMACH, BODY; COLD BIOPSY: - GASTRIC ANTRAL AND OXYNTIC MUCOSA WITH MILD SUPERFICIAL HYPEREMIA AND VASCULAR CONGESTION; OTHERWISE NO SIGNIFICANT HISTOPATHOLOGIC CHANGE. - NEGATIVE FOR H. PYLORI, DYSPLASIA, AND MALIGNANCY.  C. STOMACH, ANTRUM; COLD BIOPSY: - GASTRIC ANTRAL MUCOSA WITH NO SIGNIFICANT HISTOPATHOLOGIC CHANGE. - NEGATIVE FOR H. PYLORI, DYSPLASIA, AND MALIGNANCY.  GROSS DESCRIPTION: A. Labeled: Duodenum cbxs, abdominal pain Received: Formalin Collection time:  10:49 AM on 05/07/2020 Placed into formalin time: 9:49 AM on 05/07/2020 Tissue fragment(s): Multiple Size: Aggregate, 1 x 0.5 x 0.2 cm Description: Tan-pink soft tissue fragments Entirely submitted in 1 cassette.  B. Labeled: Gastric body cbxs, erythema Received: Formalin Collection time: 10:51 AM on 05/07/2020 Placed into formalin time: 10:51 AM on 05/07/2020 Tissue fragment(s): Multiple Size: Aggregate, 1 x 0.5 x 0.2 cm Description: Tan-pink soft tissue fragments Entirely submitted in 1 cassette.  C. Labeled: Antrum of stomach cbxs, abdominal pain Received: Formalin Collection time: 10:54 AM on 05/07/2020 Placed into formalin time: 10:54 AM on  05/07/2020 Tissue fragment(s): 2 Size: Range from 0.3-0.5 cm Description: Tan-pink soft tissue fragments Entirely submitted in 1 cassette.  RB 05/07/2020   Final Diagnosis performed by Allena Napoleon, MD.   Electronically signed 05/08/2020 9:11:43AM The electronic signature indicates that the named Attending Pathologist has evalua ted the specimen Technical component performed at [facility], [address] [place], [place] [address] Lab: [phone] Dir: Rush Farmer, MD, MMM  Professional component performed at [facility], [facility], [place], [place], [place] [address] Lab: [phone] Dir: Dellia Nims. Rubinas, MD   KOH prep     Status: None   Collection Time: 05/07/20 11:00 AM   Specimen: Bronchial Brush  Result Value Ref Range   Specimen Description BRONCHIAL BRUSHING    Special Requests NONE    KOH Prep      NO YEAST OR FUNGAL ELEMENTS SEEN Performed at [facility], [address], [place], [place] [address]    Report Status 05/07/2020 FINAL     Current Facility-Administered Medications  Medication Dose Route Frequency Provider Last Rate Last Admin  . 0.9 %  sodium chloride infusion   Intravenous Continuous Barb Merino, MD 10 mL/hr at 05/07/20 1036 Restarted at 05/07/20 1051  . albuterol (VENTOLIN HFA) 108 (90 Base) MCG/ACT inhaler 1-2 puff  1-2 puff Inhalation Q4H PRN Howerter, Justin B, DO      . carisoprodol (SOMA) tablet 700 mg  700 mg Oral BID Charlynne Cousins, MD   700 mg at 05/08/20 1534  . enalaprilat (VASOTEC) injection 0.625 mg  0.625 mg Intravenous Q6H PRN Barb Merino, MD   0.625 mg at 05/07/20 1810  . feeding supplement (BOOST / RESOURCE BREEZE) liquid 1 Container  1 Container Oral TID BM Barb Merino, MD   1 Container at 05/07/20 1515  . hydrocortisone cream 1 %   Topical BID Barb Merino, MD   Given at 05/08/20 [phone]  . HYDROmorphone (DILAUDID) injection 0.5 mg  0.5 mg Intravenous Q2H PRN Barb Merino, MD   0.5 mg at  05/08/20 1540  . lactated ringers infusion   Intravenous Continuous Howerter, Justin B, DO 100 mL/hr at 05/08/20 1254 New Bag at 05/08/20 1254  . lidocaine (XYLOCAINE) 2 % viscous mouth solution 15 mL  15 mL Mouth/Throat Q4H PRN Charlynne Cousins, MD   15 mL at 05/08/20 1244  . lisinopril (ZESTRIL) tablet 10 mg  10  mg Oral Daily Barb Merino, MD   10 mg at 05/08/20 2595  . LORazepam (ATIVAN) tablet 1 mg  1 mg Oral Q6H PRN Herschell Virani T, MD      . nicotine (NICODERM CQ - dosed in mg/24 hours) patch 21 mg  21 mg Transdermal Daily PRN Howerter, Justin B, DO      . ondansetron (ZOFRAN) injection 4 mg  4 mg Intravenous Q6H PRN Howerter, Justin B, DO   4 mg at 05/08/20 1123  . oxyCODONE (Oxy IR/ROXICODONE) immediate release tablet 5 mg  5 mg Oral BID Charlynne Cousins, MD   5 mg at 05/08/20 1244  . pantoprazole (PROTONIX) EC tablet 40 mg  40 mg Oral BID AC Lin Landsman, MD   40 mg at 05/08/20 6387  . phenol (CHLORASEPTIC) mouth spray 1 spray  1 spray Mouth/Throat PRN Lang Snow, NP   1 spray at 05/02/20 1753  . polyethylene glycol (MIRALAX / GLYCOLAX) packet 17 g  17 g Oral BID Lin Landsman, MD   17 g at 05/08/20 0834  . sucralfate (CARAFATE) 1 GM/10ML suspension 1 g  1 g Oral TID WC & HS Vanga, Tally Due, MD      . umeclidinium bromide (INCRUSE ELLIPTA) 62.5 MCG/INH 1 puff  1 puff Inhalation Daily Deatra Robinson B, RPH   1 puff at 05/02/20 0755  . [START ON 05/09/2020] venlafaxine XR (EFFEXOR-XR) 24 hr capsule 150 mg  150 mg Oral Q breakfast Konica Stankowski, Madie Reno, MD        Musculoskeletal: Strength & Muscle Tone: within normal limits Gait & Station: normal Patient leans: N/A            Psychiatric Specialty Exam:  Presentation  General Appearance: No data recorded Eye Contact:No data recorded Speech:No data recorded Speech Volume:No data recorded Handedness:No data recorded  Mood and Affect  Mood:No data recorded Affect:No data  recorded  Thought Process  Thought Processes:No data recorded Descriptions of Associations:No data recorded Orientation:No data recorded Thought Content:No data recorded History of Schizophrenia/Schizoaffective disorder:No data recorded Duration of Psychotic Symptoms:No data recorded Hallucinations:No data recorded Ideas of Reference:No data recorded Suicidal Thoughts:No data recorded Homicidal Thoughts:No data recorded  Sensorium  Memory:No data recorded Judgment:No data recorded Insight:No data recorded  Executive Functions  Concentration:No data recorded Attention Span:No data recorded Recall:No data recorded Fund of Knowledge:No data recorded Language:No data recorded  Psychomotor Activity  Psychomotor Activity:No data recorded  Assets  Assets:No data recorded  Sleep  Sleep:No data recorded  Physical Exam: Physical Exam Vitals and nursing note reviewed.  Constitutional:      Appearance: Normal appearance.  HENT:     Head: Normocephalic and atraumatic.     Mouth/Throat:     Pharynx: Oropharynx is clear.  Eyes:     Pupils: Pupils are equal, round, and reactive to light.  Cardiovascular:     Rate and Rhythm: Normal rate and regular rhythm.  Pulmonary:     Effort: Pulmonary effort is normal.     Breath sounds: Normal breath sounds.  Abdominal:     General: Abdomen is flat.  Musculoskeletal:        General: Normal range of motion.  Skin:    General: Skin is warm and dry.  Neurological:     General: No focal deficit present.     Mental Status: She is alert. Mental status is at baseline.  Psychiatric:        Attention and Perception: Attention normal.  Mood and Affect: Mood is depressed.        Speech: Speech is delayed.        Behavior: Behavior is slowed.        Thought Content: Thought content normal. Thought content is not paranoid. Thought content does not include homicidal or suicidal ideation.        Cognition and Memory: Cognition is  impaired.    Review of Systems  Constitutional: Negative.   HENT: Negative.   Eyes: Negative.   Respiratory: Negative.   Cardiovascular: Negative.   Gastrointestinal: Positive for abdominal pain.  Musculoskeletal: Negative.   Skin: Negative.   Neurological: Negative.   Psychiatric/Behavioral: Positive for depression. Negative for hallucinations and suicidal ideas. The patient is nervous/anxious.    Blood pressure 139/83, pulse 65, temperature 98.8 F (37.1 C), temperature source Oral, resp. rate 16, height 5\' 8"  (1.727 m), weight 65.3 kg, SpO2 97 %. Body mass index is 21.9 kg/m.  Treatment Plan Summary: Medication management and Plan Patient with symptoms of depression previously responsive to medicine.  I see that Effexor has been started because the hospital does not carry Pristiq in the inpatient pharmacy.  That is a fine substitution but I will increase the dose to 150 mg.  Also change the as needed benzodiazepine to Ativan at her specific request.  Patient should be referred for outpatient follow-up possibly back at [facility] at discharge.  Disposition: No evidence of imminent risk to self or others at present.   Patient does not meet criteria for psychiatric inpatient admission. Supportive therapy provided about ongoing stressors. Discussed crisis plan, support from social network, calling 911, coming to the Emergency Department, and calling Suicide Hotline.  Alethia Berthold, MD 05/08/2020 5:42 PM

## 2020-05-08 NOTE — Progress Notes (Signed)
[facility] SURGICAL PROGRESS NOTE (cpt [id number])  Hospital Day(s): 7.   Post op day(s): 1 Day Post-Op.   Interval History: Patient seen and examined, no acute events or new complaints overnight. Patient reports she still feels about the same this morning. Still with unchanged upper abdominal pain. No fever, chills, cough, CP, SOB, or emesis. She does have some nausea, mostly with eating. She did try a regular diet for dinner but could only tolerate a few bites. She did undergo colonoscopy and EGD with GI yesterday.   Review of Systems:  Constitutional: denies fever, chills  HEENT: denies cough or congestion  Respiratory: denies any shortness of breath  Cardiovascular: denies chest pain or palpitations  Gastrointestinal: + abdominal pain, + Nausea, denied vomiting, or diarrhea/and bowel function as per interval history Genitourinary: denies burning with urination or urinary frequency Musculoskeletal: denies pain, decreased motor or sensation  Vital signs in last 24 hours: [min-max] current  Temp:  [96.9 F (36.1 C)-98.7 F (37.1 C)] 98.7 F (37.1 C) (03/10 0356) Pulse Rate:  [59-86] 69 (03/10 0356) Resp:  [8-20] 18 (03/10 0356) BP: (103-177)/(61-96) 106/61 (03/10 0356) SpO2:  [93 %-99 %] 93 % (03/10 0356)     Height: 5\' 8"  (172.7 cm) Weight: 65.3 kg BMI (Calculated): 21.9   Intake/Output last 2 shifts:  03/09 0701 - 03/10 0700 In: 200 [I.V.:200] Out: -    Physical Exam:  Constitutional: alert, cooperative and no distress  HENT: normocephalic without obvious abnormality  Eyes: PERRL, EOM's grossly intact and symmetric  Respiratory: breathing non-labored at rest  Cardiovascular: regular rate and sinus rhythm  Gastrointestinal: Soft, she does havepersistent/consistenttenderness to the epigastrium diminishing to the right upper quadrant, non-distended,elsewhere withoutrebound/guarding Musculoskeletal: no edema or wounds, motor and sensation grossly intact, NT     Labs:  CBC Latest Ref Rng & Units 05/05/2020 05/04/2020 05/02/2020  WBC 4.0 - 10.5 K/uL 10.3 12.1(H) 10.1  Hemoglobin 12.0 - 15.0 g/dL 13.0 13.2 14.6  Hematocrit 36.0 - 46.0 % 36.9 39.3 41.9  Platelets 150 - 400 K/uL 299 297 327   CMP Latest Ref Rng & Units 05/05/2020 05/04/2020 05/02/2020  Glucose 70 - 99 mg/dL 92 89 104(H)  BUN 6 - 20 mg/dL 9 11 20   Creatinine 0.44 - 1.00 mg/dL 0.64 0.75 0.72  Sodium 135 - 145 mmol/L 137 137 137  Potassium 3.5 - 5.1 mmol/L 3.6 3.9 3.5  Chloride 98 - 111 mmol/L 97(L) 96(L) 99  CO2 22 - 32 mmol/L 29 28 28   Calcium 8.9 - 10.3 mg/dL 8.7(L) 8.9 8.8(L)  Total Protein 6.5 - 8.1 g/dL - 6.5 6.4(L)  Total Bilirubin 0.3 - 1.2 mg/dL - 1.1 0.8  Alkaline Phos 38 - 126 U/L - 110 131(H)  AST 15 - 41 U/L - 13(L) 16  ALT 0 - 44 U/L - 12 17     Imaging studies: No new pertinent imaging studies   Assessment/Plan: (ICD-10's: K56.609) 55 y.o.femalewith persistent abdominal pain of unclear etiology, thought to be pSBO vs gastroenteritis vs exacerbation of possible underlying gastroparesis, now with return of bowel function. I have a very low clinical suspicion for true mechanical obstruction and I think symptoms may be more related to some degree of underlying motility issue, which she is being worked up for as an outpatient.    - Okay to ADAT from surgical perspective    - No plan for surgical interventions   - Monitor abdominal examination; on-going bowel function - Pain control prn; antiemetics prn   -  Mobilization encouraged - Further management per primary service   - Nothing further to add from surgical perspective. Appreciate GI involvement and recommendations; follow up with GI as scheduled. No indication for surgical follow up. We will sign off but be available.    All of the above findings and recommendations were discussed with the patient, and the medical team, and all of patient's questions were answered to her expressed  satisfaction.  -- Edison Simon, PA-C [facility] 05/08/2020, 7:59 AM [phone] M-F: 7am - 4pm

## 2020-05-08 NOTE — Progress Notes (Signed)
Physical Therapy Treatment Patient Details Name: [name] MRN: [id number] DOB: [birth year] Today's Date: 05/08/2020    History of Present Illness [name] is a 62yoF who [facility] c progresive ABD pain, found to have SOB. Underwent EGD, colonoscopy 05/07/20. PMH: TBI (2009), GERD, PTSD, Seizures, back surgery, appencdectomy (2020). At baseline pt lives alone, fully independent but has chronic stamina limitations, chronic gait unsteadiness without any recent falls history, no DME at baseline, AMB community for IADL performance.    PT Comments    Pt in bed upon arrival, cleaned up, and in clean clothes. Sister at bedside, assisted with cleanup earlier. Wife comes in with pain meds. Pt agreeable to demonstrate mobility capabilities. Author assists with IV pole management. Pt walks a lap around the unit then up the gift shop, at which point Pryor Curia asks to turn around as IV pole does not roll especially well on grouted tile. Pt uses no device, has no limiting dyspnea, no gross gait asymmetry. Pt denies concerns regarding safety with basic mobility at DC. As pt has been AMB unit almost daily with her sister and continues to move well, no skilled PT services are warranted at this time. Will sign off. Thank you for this consult.    Follow Up Recommendations  No PT follow up     Equipment Recommendations  None recommended by PT    Recommendations for Other Services       Precautions / Restrictions Precautions Precautions: None Precaution Comments: remote TBI c gait changes, no significant recent falls history or DME use for gait    Mobility  Bed Mobility Overal bed mobility: Independent                  Transfers Overall transfer level: Independent                  Ambulation/Gait Ambulation/Gait assistance: Modified independent (Device/Increase time) (author driving IV pole) Gait Distance (Feet): 1200 Feet Assistive device: None Gait Pattern/deviations: WFL(Within  Functional Limits)         Stairs             Wheelchair Mobility    Modified Rankin (Stroke Patients Only)       Balance Overall balance assessment: Independent                                          Cognition Arousal/Alertness: Awake/alert Behavior During Therapy: WFL for tasks assessed/performed Overall Cognitive Status: Within Functional Limits for tasks assessed                                        Exercises      General Comments        Pertinent Vitals/Pain Pain Assessment: 0-10 Pain Score: 7  Pain Location: ABD pain Pain Intervention(s): Patient requesting pain meds-RN notified;RN gave pain meds during session    Home Living                      Prior Function            PT Goals (current goals can now be found in the care plan section) Acute Rehab PT Goals Patient Stated Goal: feel better PT Goal Formulation: With patient Time For Goal Achievement: 05/21/20 Potential to  Achieve Goals: Good Progress towards PT goals: Goals met/education completed, patient discharged from PT    Frequency           PT Plan Current plan remains appropriate    Co-evaluation              AM-PAC PT "6 Clicks" Mobility   Outcome Measure  Help needed turning from your back to your side while in a flat bed without using bedrails?: None Help needed moving from lying on your back to sitting on the side of a flat bed without using bedrails?: None Help needed moving to and from a bed to a chair (including a wheelchair)?: None Help needed standing up from a chair using your arms (e.g., wheelchair or bedside chair)?: None Help needed to walk in hospital room?: None Help needed climbing 3-5 steps with a railing? : None 6 Click Score: 24    End of Session   Activity Tolerance: Patient tolerated treatment well;No increased pain Patient left: in bed;with family/visitor present;with call bell/phone within  reach Nurse Communication: Mobility status PT Visit Diagnosis: Other abnormalities of gait and mobility (R26.89);Muscle weakness (generalized) (M62.81)     Time: 7017-7939 PT Time Calculation (min) (ACUTE ONLY): 20 min  Charges:  $Therapeutic Activity: 8-22 mins                     4:23 PM, 05/08/20 Etta Grandchild, PT, DPT Physical Therapist - [facility]  [phone] ([place])    Hedley C 05/08/2020, 4:19 PM

## 2020-05-08 NOTE — Progress Notes (Signed)
Becky Darby, MD [address]  [place], [place] [address]  Main: [phone]  Fax: [phone] Pager: [phone]   Subjective: Patient was tearful when I saw her.  Her sister is bedside.  She just met psychiatrist about her anxiety.  She underwent upper endoscopy and colonoscopy yesterday.  She reports ongoing upper abdominal pain when she tries to eat. She did not have a BM today  Objective: Vital signs in last 24 hours: Vitals:   05/07/20 2342 05/08/20 0356 05/08/20 0829 05/08/20 1247  BP: 106/73 106/61 136/82 139/83  Pulse: 66 69 65 65  Resp: $Remo'18 18 18 16  'WJMSl$ Temp: 97.6 F (36.4 C) 98.7 F (37.1 C) 98.4 F (36.9 C) 98.8 F (37.1 C)  TempSrc: Oral Oral  Oral  SpO2: 97% 93% 95% 97%  Weight:      Height:       Weight change:   Intake/Output Summary (Last 24 hours) at 05/08/2020 1805 Last data filed at 05/08/2020 1024 Gross per 24 hour  Intake 240 ml  Output --  Net 240 ml     Exam: Heart:: Regular rate and rhythm, S1S2 present or without murmur or extra heart sounds Lungs: normal and clear to auscultation Abdomen: Soft, mild upper abdominal tenderness, non distended   Lab Results: CBC Latest Ref Rng & Units 05/05/2020 05/04/2020 05/02/2020  WBC 4.0 - 10.5 K/uL 10.3 12.1(H) 10.1  Hemoglobin 12.0 - 15.0 g/dL 13.0 13.2 14.6  Hematocrit 36.0 - 46.0 % 36.9 39.3 41.9  Platelets 150 - 400 K/uL 299 297 327   CMP Latest Ref Rng & Units 05/05/2020 05/04/2020 05/02/2020  Glucose 70 - 99 mg/dL 92 89 104(H)  BUN 6 - 20 mg/dL $Remove'9 11 20  'QZKIQgQ$ Creatinine 0.44 - 1.00 mg/dL 0.64 0.75 0.72  Sodium 135 - 145 mmol/L 137 137 137  Potassium 3.5 - 5.1 mmol/L 3.6 3.9 3.5  Chloride 98 - 111 mmol/L 97(L) 96(L) 99  CO2 22 - 32 mmol/L $RemoveB'29 28 28  'XOKDKmwo$ Calcium 8.9 - 10.3 mg/dL 8.7(L) 8.9 8.8(L)  Total Protein 6.5 - 8.1 g/dL - 6.5 6.4(L)  Total Bilirubin 0.3 - 1.2 mg/dL - 1.1 0.8  Alkaline Phos 38 - 126 U/L - 110 131(H)  AST 15 - 41 U/L - 13(L) 16  ALT 0 - 44 U/L - 12 17    Micro  Results: Recent Results (from the past 240 hour(s))  Resp Panel by RT-PCR (Flu A&B, Covid) Nasopharyngeal Swab     Status: None   Collection Time: 05/01/20  2:24 PM   Specimen: Nasopharyngeal Swab; Nasopharyngeal(NP) swabs in vial transport medium  Result Value Ref Range Status   SARS Coronavirus 2 by RT PCR NEGATIVE NEGATIVE Final    Comment: (NOTE) SARS-CoV-2 target nucleic acids are NOT DETECTED.  The SARS-CoV-2 RNA is generally detectable in upper respiratory specimens during the acute phase of infection. The lowest concentration of SARS-CoV-2 viral copies this assay can detect is 138 copies/mL. A negative result does not preclude SARS-Cov-2 infection and should not be used as the sole basis for treatment or other patient management decisions. A negative result may occur with  improper specimen collection/handling, submission of specimen other than nasopharyngeal swab, presence of viral mutation(s) within the areas targeted by this assay, and inadequate number of viral copies(<138 copies/mL). A negative result must be combined with clinical observations, patient history, and epidemiological information. The expected result is Negative.  Fact Sheet for Patients:  EntrepreneurPulse.com.au  Fact Sheet for Healthcare  Providers:  IncredibleEmployment.be  This test is no t yet approved or cleared by the [place] and  has been authorized for detection and/or diagnosis of SARS-CoV-2 by FDA under an Emergency Use Authorization (EUA). This EUA will remain  in effect (meaning this test can be used) for the duration of the COVID-19 declaration under Section 564(b)(1) of the Act, 21 U.S.C.section 360bbb-3(b)(1), unless the authorization is terminated  or revoked sooner.       Influenza A by PCR NEGATIVE NEGATIVE Final   Influenza B by PCR NEGATIVE NEGATIVE Final    Comment: (NOTE) The Xpert Xpress SARS-CoV-2/FLU/RSV plus assay is intended  as an aid in the diagnosis of influenza from Nasopharyngeal swab specimens and should not be used as a sole basis for treatment. Nasal washings and aspirates are unacceptable for Xpert Xpress SARS-CoV-2/FLU/RSV testing.  Fact Sheet for Patients: EntrepreneurPulse.com.au  Fact Sheet for Healthcare Providers: IncredibleEmployment.be  This test is not yet approved or cleared by the [place] FDA and has been authorized for detection and/or diagnosis of SARS-CoV-2 by FDA under an Emergency Use Authorization (EUA). This EUA will remain in effect (meaning this test can be used) for the duration of the COVID-19 declaration under Section 564(b)(1) of the Act, 21 U.S.C. section 360bbb-3(b)(1), unless the authorization is terminated or revoked.  Performed at [facility], [address] [place], [place] [address]   KOH prep     Status: None   Collection Time: 05/07/20 11:00 AM   Specimen: Bronchial Brush  Result Value Ref Range Status   Specimen Description BRONCHIAL BRUSHING  Final   Special Requests NONE  Final   KOH Prep   Final    NO YEAST OR FUNGAL ELEMENTS SEEN Performed at [facility], [address] [place], [place] [address]    Report Status 05/07/2020 FINAL  Final   Studies/Results: No results found. Medications:  I have reviewed the patient's current medications. Prior to Admission:  Medications Prior to Admission  Medication Sig Dispense Refill Last Dose  . Tiotropium Bromide Monohydrate (SPIRIVA RESPIMAT) 2.5 MCG/ACT AERS Inhale 2 puffs into the lungs daily. 4 g 5 Past Week at Unknown time  . albuterol (VENTOLIN HFA) 108 (90 Base) MCG/ACT inhaler INL 2 PFS PO Q 4 TO 6 H IF NEEDED 18 g 3 unknown at prn  . butalbital-acetaminophen-caffeine (FIORICET) 50-325-40 MG tablet Take 1 tablet by mouth every 6 (six) hours as needed for headache. 30 tablet 0 unknown at prn  . carisoprodol (SOMA) 350 MG tablet Take 700 mg  by mouth 2 (two) times daily.    04/29/2020 at 2000  . Docusate Sodium (DSS) 100 MG CAPS TK ONE C PO BID PRF MILD CONSTIPATION   unknown at prn  . EMGALITY 120 MG/ML SOAJ Inject 1 mL into the muscle every 28 (twenty-eight) days.     . fexofenadine (ALLEGRA) 180 MG tablet Take 180 mg by mouth daily.    unknown at prn  . lisinopril-hydrochlorothiazide (ZESTORETIC) 10-12.5 MG tablet TAKE 1 TABLET BY MOUTH EVERY DAY 90 tablet 0 04/29/2020 at 2000  . oxyCODONE (OXY IR/ROXICODONE) 5 MG immediate release tablet Take 5 mg by mouth 2 (two) times daily.   04/29/2020 at 2000  . pantoprazole (PROTONIX) 40 MG tablet Take 1 tablet (40 mg total) by mouth 2 (two) times daily. 180 tablet 3 04/29/2020 at 2000   Scheduled: . carisoprodol  700 mg Oral BID  . feeding supplement  1 Container Oral TID BM  .  hydrocortisone cream   Topical BID  . lisinopril  10 mg Oral Daily  . oxyCODONE  5 mg Oral BID  . pantoprazole  40 mg Oral BID AC  . polyethylene glycol  17 g Oral BID  . sucralfate  1 g Oral TID WC & HS  . umeclidinium bromide  1 puff Inhalation Daily  . [START ON 05/09/2020] venlafaxine XR  150 mg Oral Q breakfast   Continuous: . sodium chloride 10 mL/hr at 05/07/20 1036  . lactated ringers 100 mL/hr at 05/08/20 1254   OHY:WVPXTGGYI, enalaprilat, HYDROmorphone (DILAUDID) injection, lidocaine, LORazepam, nicotine, ondansetron (ZOFRAN) IV, phenol Anti-infectives (From admission, onward)   None     Scheduled Meds: . carisoprodol  700 mg Oral BID  . feeding supplement  1 Container Oral TID BM  . hydrocortisone cream   Topical BID  . lisinopril  10 mg Oral Daily  . oxyCODONE  5 mg Oral BID  . pantoprazole  40 mg Oral BID AC  . polyethylene glycol  17 g Oral BID  . sucralfate  1 g Oral TID WC & HS  . umeclidinium bromide  1 puff Inhalation Daily  . [START ON 05/09/2020] venlafaxine XR  150 mg Oral Q breakfast   Continuous Infusions: . sodium chloride 10 mL/hr at 05/07/20 1036  . 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 DUODENUM; COLD BIOPSY:  - ENTERIC MUCOSA WITH PRESERVED VILLOUS ARCHITECTURE AND NO SIGNIFICANT  HISTOPATHOLOGIC CHANGE.  - NEGATIVE FOR FEATURES OF CELIAC, DYSPLASIA, AND MALIGNANCY.   B. STOMACH, BODY; COLD BIOPSY:  - GASTRIC ANTRAL AND OXYNTIC MUCOSA WITH MILD SUPERFICIAL HYPEREMIA AND  VASCULAR CONGESTION; OTHERWISE NO SIGNIFICANT HISTOPATHOLOGIC CHANGE.  - NEGATIVE FOR H. PYLORI, DYSPLASIA, AND MALIGNANCY.   C. STOMACH, ANTRUM; COLD BIOPSY:  - GASTRIC ANTRAL MUCOSA WITH NO SIGNIFICANT HISTOPATHOLOGIC CHANGE.  - NEGATIVE FOR H. PYLORI, DYSPLASIA, AND MALIGNANCY.   Plan: Partial small bowel obstruction, s/p decompression with NG tube S/p EGD and colonoscopy on 3/9.  Found to have severe gastric erythema in fundus, clean-based lower esophageal ulcer.  Colonoscopy was normal including terminal ileum, no evidence of inflammation or stricture KOH prep did not reveal any esophageal candidiasis, no evidence of H. pylori infection Trial of sucralfate suspension 3 times daily and at bedtime along with Protonix 40 mg po twice daily, Tench-term Encourage small frequent meals Do not expect her bowel movement for 2 or 3 days as patient had a colonoscopy with prep yesterday Anticipate discharge home tomorrow Follow-up with GI as outpatient   LOS: 7 days   [name] 05/08/2020, 6:05 PM

## 2020-05-08 NOTE — Plan of Care (Signed)

## 2020-05-08 NOTE — TOC Initial Note (Signed)
Transition of Care [facility]) - Initial/Assessment Note    Patient Details  Name: [name] MRN: [id number] Date of Birth: 08-Jan-1966  Transition of Care [facility]) CM/SW Contact:    Shelbie Hutching, RN Phone Number: 05/08/2020, 4:46 PM  Clinical Narrative:                 Patient admitted to the hospital with small bowel obstruction.  Patient is still not tolerating solid foot.  RNCM met with patient at the bedside.  Patient is from home where she lives alone and is independent.  She has a drivers license but her son provides transportation to appointments.  Patient's sister, Judeen Hammans is here visiting from [place] to help her out.  Patient reports that she has not followed up with psychiatry in some time and is out of all of her medications, she has a history of TBI and reports that she is unable to schedule follow ups on her own.  Psychiatry consult has been ordered.   Patient may be agreeable to home health but reports that she needs to have her anxiety under control.   TOC will follow up with patient tomorrow.   Expected Discharge Plan: [place] Barriers to Discharge: Continued Medical Work up   Patient Goals and CMS Choice Patient states their goals for this hospitalization and ongoing recovery are:: patient will not leave the hosptial until she can tolerate food and her blood pressure is controlled CMS Medicare.gov Compare Post Acute Care list provided to:: Patient Choice offered to / list presented to : Patient  Expected Discharge Plan and Services Expected Discharge Plan: [place]   Discharge Planning Services: CM Consult Post Acute Care Choice: [place] arrangements for the past 2 months: Single Family Home                                      Prior Living Arrangements/Services Living arrangements for the past 2 months: Single Family Home Lives with:: Self Patient language and need for interpreter reviewed:: Yes Do you feel safe  going back to the place where you live?: Yes      Need for Family Participation in Patient Care: Yes (Comment) (SBO) Care giver support system in place?: Yes (comment) (son and sister) Current home services: DME (cane) Criminal Activity/Legal Involvement Pertinent to Current Situation/Hospitalization: No - Comment as needed  Activities of Daily Living Home Assistive Devices/Equipment: None ADL Screening (condition at time of admission) Patient's cognitive ability adequate to safely complete daily activities?: Yes Is the patient deaf or have difficulty hearing?: No Does the patient have difficulty seeing, even when wearing glasses/contacts?: No Does the patient have difficulty concentrating, remembering, or making decisions?: No Patient able to express need for assistance with ADLs?: No Does the patient have difficulty dressing or bathing?: No Independently performs ADLs?: Yes (appropriate for developmental age) Communication: Independent Dressing (OT): Independent Grooming: Independent Feeding: Independent Bathing: Independent Toileting: Independent In/Out Bed: Independent Walks in Home: Independent Does the patient have difficulty walking or climbing stairs?: No Weakness of Legs: None Weakness of Arms/Hands: None  Permission Sought/Granted Permission sought to share information with : Case Manager,Family Supports Permission granted to share information with : Yes, Verbal Permission Granted  Share Information with NAME: Judeen Hammans     Permission granted to share info w Relationship: sister     Emotional Assessment Appearance:: Appears stated age  Attitude/Demeanor/Rapport: Engaged,Complaining Affect (typically observed): Anxious,Depressed,Frustrated Orientation: : Oriented to Self,Oriented to Place,Oriented to  Time,Oriented to Situation Alcohol / Substance Use: Not Applicable Psych Involvement: Yes (comment)  Admission diagnosis:  SOB (shortness of breath) [R06.02] Partial  small bowel obstruction ([place]) [K56.600] SBO (small bowel obstruction) ([place]) [K56.609] Patient Active Problem List   Diagnosis Date Noted  . SOB (shortness of breath) 05/01/2020  . SBO (small bowel obstruction) ([place]) 05/01/2020  . Leukocytosis 05/01/2020  . Colon cancer screening 03/20/2020  . Gastric outlet obstruction 03/20/2020  . Chronic obstructive pulmonary disease ([place]) 06/14/2019  . Tobacco use 06/14/2019  . Traumatic brain injury with loss of consciousness ([place]) 05/08/2019  . 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 S/P laparoscopic appendectomy 02/15/2019  . Nonintractable headache   . Hypertension   . Chronic neck pain   . Vertigo 07/11/2017  . Abdominal pain 11/15/2016  . FH: colon polyps 11/15/2016  . Elevated alkaline phosphatase level 11/15/2016  . BRBPR (bright red blood per rectum) 11/15/2016  . Other dysphagia 11/15/2016  . Other constipation 11/15/2016  . PTSD (post-traumatic stress disorder) 05/18/2016  . Breast mass, left 01/08/2016  . Anxiety and depression 01/08/2016  . Chronic pain 01/08/2016  . Seizure disorder ([place]) 01/08/2016  . GERD (gastroesophageal reflux disease) 01/08/2016  . Difficulty sleeping 08/05/2015  . Mood disorder ([place]) 05/05/2015  . Tobacco abuse counseling 04/15/2015  . Crushing injury of neck 09/27/2011  . Head injury 05/06/2011   PCP:  [name], PA-C Pharmacy:   [facility] [place], [place] - [address] [place] [address] Phone: [phone] Fax: [phone]  [id number] [place] [address] [id number]: [phone]: [phone]    Social Determinants of Health (SDOH) Interventions    Readmission Risk Interventions No flowsheet data found.

## 2020-05-08 NOTE — Progress Notes (Signed)
[facility] PROGRESS NOTE    Progress Note  [name]  NLG:[id number] DOB: [birth year] DOA: 05/01/2020 PCP: [name], PA-C     Brief Narrative:   [name] is an 55 y.o. female past medical history of COPD, chronic pain syndrome, history of traumatic brain injury essential hypertension comes into the ED with 2 days of abdominal pain nausea and vomiting in the emergency room she was hemodynamically stable was found to have a small bowel obstruction  Significant Events: 05/01/2020 admission  Significant studies: 05/01/2020 CT scan of the abdomen and pelvis consistent with a small bowel obstruction. 06/02/2020 abdominal x-ray showed improved partial small bowel obstruction NG tube in place.  Next line 05/03/2022 was unremarkable.  Antibiotics: None  Microbiology data: Blood culture:  Procedures: 05/07/2020 EGD that showed normal duodenum bulb second portion of the duodenum, some erythematous mucosa of the gastric body and an esophageal ulcer nonbleeding with no stigmata of bleeding. 05/07/2020 colonoscopy there was an inadequate preparation showed normal ileum relatively normal mucosa.  Assessment/Plan:   Abdominal pain due to large esophageal ulcer with no stigmata of bleeding: Significantly improved tolerating her clears appreciate surgery's assistance. Endoscopic biopsy results showed no H. pylori, no dysplasia no malignancy. Patient did not tolerate her diet will go back on her full liquid diet. We will give her lidocaine viscous gel in case she develops pain. Awaiting GI further recommendations.  Essential hypertension: Blood pressure is stable she has been resume her home medications.  Chronic pain syndrome: Continue oxycodone.  COPD: Stable.  GERD: Continue PPI.   DVT prophylaxis: scd Family Communication:none Status is: Inpatient  Remains inpatient appropriate because:Hemodynamically unstable   Dispo: The patient is from: Home              Anticipated  d/c is to: Home              Patient currently is not medically stable to d/c.   Difficult to place patient No     Code Status:     Code Status Orders  (From admission, onward)         Start     Ordered   05/01/20 1758  Full code  Continuous        05/01/20 1757        Code Status History    Date Active Date Inactive Code Status Order ID Comments User Context   01/24/2019 0304 01/28/2019 2305 Full Code [id number]  Fritzi Mandes, MD ED   Advance Care Planning Activity        IV Access:    Peripheral IV   Procedures and diagnostic studies:   No results found.   Medical Consultants:    None.   Subjective:    [name] having pain with eating with her diet.  Objective:    Vitals:   05/07/20 1937 05/07/20 2342 05/08/20 0356 05/08/20 0829  BP: 109/82 106/73 106/61 136/82  Pulse: 64 66 69 65  Resp: 14 18 18 18   Temp: 98.6 F (37 C) 97.6 F (36.4 C) 98.7 F (37.1 C) 98.4 F (36.9 C)  TempSrc: Oral Oral Oral   SpO2: 96% 97% 93% 95%  Weight:      Height:       SpO2: 95 %   Intake/Output Summary (Last 24 hours) at 05/08/2020 0931 Last data filed at 05/07/2020 1051 Gross per 24 hour  Intake 200 ml  Output -  Net 200 ml   [employer]   05/01/20  1419  Weight: 65.3 kg    Exam: General exam: In no acute distress. Respiratory system: Good air movement and clear to auscultation. Cardiovascular system: S1 & S2 heard, RRR. No JVD. Gastrointestinal system: Positive bowel sounds soft nontender nondistended. Extremities: No pedal edema. Skin: No rashes, lesions or ulcers Psychiatry: Judgement and insight appear normal. Mood & affect appropriate.   Data Reviewed:    Labs: Basic Metabolic Panel: Recent Labs  Lab 05/01/20 1424 05/02/20 0341 05/04/20 0446 05/05/20 0554  NA 134* 137 137 137  K 3.8 3.5 3.9 3.6  CL 96* 99 96* 97*  CO2 24 28 28 29   GLUCOSE 119* 104* 89 92  BUN 19 20 11 9   CREATININE 0.81 0.72 0.75 0.64  CALCIUM 9.7 8.8*  8.9 8.7*  MG 2.4 2.0 1.8 2.2  PHOS  --   --  3.0  --    GFR Estimated Creatinine Clearance: 82 mL/min (by C-G formula based on SCr of 0.64 mg/dL). Liver Function Tests: Recent Labs  Lab 05/01/20 1424 05/02/20 0341 05/04/20 0446  AST 19 16 13*  ALT 21 17 12   ALKPHOS 172* 131* 110  BILITOT 0.9 0.8 1.1  PROT 8.3* 6.4* 6.5  ALBUMIN 4.6 3.5 3.3*   Recent Labs  Lab 05/01/20 1424  LIPASE 28   No results for input(s): AMMONIA in the last 168 hours. Coagulation profile No results for input(s): INR, PROTIME in the last 168 hours. COVID-19 Labs  No results for input(s): DDIMER, FERRITIN, LDH, CRP in the last 72 hours.  Lab Results  Component Value Date   SARSCOV2NAA NEGATIVE 05/01/2020   [place] NEGATIVE 01/24/2019    CBC: Recent Labs  Lab 05/01/20 1424 05/02/20 0341 05/04/20 0446 05/05/20 0554  WBC 14.6* 10.1 12.1* 10.3  NEUTROABS  --  6.0 9.4* 6.9  HGB 16.8* 14.6 13.2 13.0  HCT 48.9* 41.9 39.3 36.9  MCV 86.2 86.6 88.3 85.6  PLT 415* 327 297 299   Cardiac Enzymes: No results for input(s): CKTOTAL, CKMB, CKMBINDEX, TROPONINI in the last 168 hours. BNP (last 3 results) No results for input(s): PROBNP in the last 8760 hours. CBG: No results for input(s): GLUCAP in the last 168 hours. D-Dimer: No results for input(s): DDIMER in the last 72 hours. Hgb A1c: No results for input(s): HGBA1C in the last 72 hours. Lipid Profile: No results for input(s): CHOL, HDL, LDLCALC, TRIG, CHOLHDL, LDLDIRECT in the last 72 hours. Thyroid function studies: No results for input(s): TSH, T4TOTAL, T3FREE, THYROIDAB in the last 72 hours.  Invalid input(s): FREET3 Anemia work up: No results for input(s): VITAMINB12, FOLATE, FERRITIN, TIBC, IRON, RETICCTPCT in the last 72 hours. Sepsis Labs: Recent Labs  Lab 05/01/20 1424 05/02/20 0341 05/04/20 0446 05/05/20 0554  WBC 14.6* 10.1 12.1* 10.3   Microbiology Recent Results (from the past 240 hour(s))  Resp Panel by RT-PCR  (Flu A&B, Covid) Nasopharyngeal Swab     Status: None   Collection Time: 05/01/20  2:24 PM   Specimen: Nasopharyngeal Swab; Nasopharyngeal(NP) swabs in vial transport medium  Result Value Ref Range Status   SARS Coronavirus 2 by RT PCR NEGATIVE NEGATIVE Final    Comment: (NOTE) SARS-CoV-2 target nucleic acids are NOT DETECTED.  The SARS-CoV-2 RNA is generally detectable in upper respiratory specimens during the acute phase of infection. The lowest concentration of SARS-CoV-2 viral copies this assay can detect is 138 copies/mL. A negative result does not preclude SARS-Cov-2 infection and should not be used as the sole basis for treatment or  other patient management decisions. A negative result may occur with  improper specimen collection/handling, submission of specimen other than nasopharyngeal swab, presence of viral mutation(s) within the areas targeted by this assay, and inadequate number of viral copies(<138 copies/mL). A negative result must be combined with clinical observations, patient history, and epidemiological information. The expected result is Negative.  Fact Sheet for Patients:  EntrepreneurPulse.com.au  Fact Sheet for Healthcare Providers:  IncredibleEmployment.be  This test is no t yet approved or cleared by the [place] FDA and  has been authorized for detection and/or diagnosis of SARS-CoV-2 by FDA under an Emergency Use Authorization (EUA). This EUA will remain  in effect (meaning this test can be used) for the duration of the COVID-19 declaration under Section 564(b)(1) of the Act, 21 U.S.C.section 360bbb-3(b)(1), unless the authorization is terminated  or revoked sooner.       Influenza A by PCR NEGATIVE NEGATIVE Final   Influenza B by PCR NEGATIVE NEGATIVE Final    Comment: (NOTE) The Xpert Xpress SARS-CoV-2/FLU/RSV plus assay is intended as an aid in the diagnosis of influenza from Nasopharyngeal swab specimens  and should not be used as a sole basis for treatment. Nasal washings and aspirates are unacceptable for Xpert Xpress SARS-CoV-2/FLU/RSV testing.  Fact Sheet for Patients: EntrepreneurPulse.com.au  Fact Sheet for Healthcare Providers: IncredibleEmployment.be  This test is not yet approved or cleared by the [place] FDA and has been authorized for detection and/or diagnosis of SARS-CoV-2 by FDA under an Emergency Use Authorization (EUA). This EUA will remain in effect (meaning this test can be used) for the duration of the COVID-19 declaration under Section 564(b)(1) of the Act, 21 U.S.C. section 360bbb-3(b)(1), unless the authorization is terminated or revoked.  Performed at [facility], [address] [place], [place] [address]   KOH prep     Status: None   Collection Time: 05/07/20 11:00 AM   Specimen: Bronchial Brush  Result Value Ref Range Status   Specimen Description BRONCHIAL BRUSHING  Final   Special Requests NONE  Final   KOH Prep   Final    NO YEAST OR FUNGAL ELEMENTS SEEN Performed at [facility] [address], [place], [place] [address]    Report Status 05/07/2020 FINAL  Final     Medications:   . feeding supplement  1 Container Oral TID BM  . hydrocortisone cream   Topical BID  . lisinopril  10 mg Oral Daily  . pantoprazole  40 mg Oral BID AC  . polyethylene glycol  17 g Oral BID  . umeclidinium bromide  1 puff Inhalation Daily   Continuous Infusions: . sodium chloride 10 mL/hr at 05/07/20 1036  . lactated ringers 100 mL/hr at 05/08/20 0427      LOS: 7 days   Charlynne Cousins  [facility]  05/08/2020, 9:31 AM

## 2020-05-08 NOTE — Plan of Care (Signed)

## 2020-05-08 NOTE — Progress Notes (Signed)
PT Cancellation Note  Patient Details Name: [name] MRN: [id number] DOB: 1966-01-22   Cancelled Treatment:    Reason Eval/Treat Not Completed: Fatigue/lethargy limiting ability to participate (Stopped by for PT treatment. Pt waiting for sister to return from store with provisions to be utilized for hygiene and dressing. Pt strongly desires to get cleaned up and dressed, then plans to AMB the unit with sister.) Pt reports her ABD pain remains high, made worse by PO eggs this morning. Will come back in afternoon and attempt session at later date/time.    Shonda Mandarino C 05/08/2020, 11:07 AM

## 2020-05-09 ENCOUNTER — Encounter: Payer: Self-pay | Admitting: Gastroenterology

## 2020-05-09 DIAGNOSIS — K2210 Ulcer of esophagus without bleeding: Secondary | ICD-10-CM | POA: Diagnosis not present

## 2020-05-09 DIAGNOSIS — K56609 Unspecified intestinal obstruction, unspecified as to partial versus complete obstruction: Secondary | ICD-10-CM | POA: Diagnosis not present

## 2020-05-09 DIAGNOSIS — I10 Essential (primary) hypertension: Secondary | ICD-10-CM | POA: Diagnosis not present

## 2020-05-09 DIAGNOSIS — K560 Paralytic ileus: Secondary | ICD-10-CM | POA: Diagnosis not present

## 2020-05-09 MED ORDER — ONDANSETRON HCL 4 MG/2ML IJ SOLN
4.0000 mg | INTRAMUSCULAR | Status: DC | PRN
  Administered 2020-05-09 – 2020-05-10 (×3): 4 mg via INTRAVENOUS
  Filled 2020-05-09 (×3): qty 2

## 2020-05-09 NOTE — Progress Notes (Addendum)
Pt. A/Ox4 can verbally respond with clear speech and ambulatory up ad lib. Continent of B/B. Skin warm, dry,and intact with no new s/s of impairment noted. Tolerated all medication well, compliance with regimen, no side, or adverse effects noted. Medicated x3 with prn IV pain medication  Dilaudid 0.5mg , effective within one hour. Medicated x1 with Ativan 1 mg for anxiety effective within one hour. Medicated x2 with Zofran for nausea, effectiive within one hour. Denies distress, discomfort, and acute pain at this time.

## 2020-05-09 NOTE — Plan of Care (Signed)
  Problem: Education: Goal: Knowledge of General Education information will improve Description: Including pain rating scale, medication(s)/side effects and non-pharmacologic comfort measures 05/09/2020 0639 by Frazier Butt, RN Outcome: Progressing 05/09/2020 0524 by Frazier Butt, RN Outcome: Progressing

## 2020-05-09 NOTE — Care Management Important Message (Signed)
Important Message  Patient Details  Name: [name] MRN: [id number] Date of Birth: 1965/08/27   Medicare Important Message Given:  Yes     Juliann Pulse A Sherill Wegener 05/09/2020, 10:56 AM

## 2020-05-09 NOTE — Progress Notes (Signed)
[facility] PROGRESS NOTE    Progress Note  [name]  ZJI:[id number] DOB: [birth year] DOA: 05/01/2020 PCP: [name], PA-C     Brief Narrative:   [name] is an 55 y.o. female past medical history of COPD, chronic pain syndrome, history of traumatic brain injury essential hypertension comes into the ED with 2 days of abdominal pain nausea and vomiting in the emergency room she was hemodynamically stable was found to have a small bowel obstruction  Significant Events: 05/01/2020 admission  Significant studies: 05/01/2020 CT scan of the abdomen and pelvis consistent with a small bowel obstruction. 06/02/2020 abdominal x-ray showed improved partial small bowel obstruction NG tube in place.   Antibiotics: None  Microbiology data: Blood culture:  Procedures: 05/07/2020 EGD that showed normal duodenum bulb second portion of the duodenum, some erythematous mucosa of the gastric body and an esophageal ulcer nonbleeding with no stigmata of bleeding. 05/07/2020 colonoscopy there was an inadequate preparation showed normal ileum relatively normal mucosa.  Assessment/Plan:   Abdominal pain due to large esophageal ulcer with no stigmata of bleeding: Significantly improved tolerating her clears appreciate surgery's assistance. GI recommended sucralfate and continue to advance her diet. She tolerated full liquid diet this morning we will see how she does this morning can advance her to soft diet this afternoon and she tolerated can be discharged tomorrow morning. Cont lidocaine viscous gel in case she develops pain.  Discontinue IV fluids.  Essential hypertension: Blood pressure is stable she has been resume her home medications.  Chronic pain syndrome: Continue oxycodone.  COPD: Stable.  GERD: Continue PPI.   DVT prophylaxis: scd Family Communication:none Status is: Inpatient  Remains inpatient appropriate because:Hemodynamically unstable   Dispo: The patient is from:  Home              Anticipated d/c is to: Home              Patient currently is not medically stable to d/c.   Difficult to place patient No     Code Status:     Code Status Orders  (From admission, onward)         Start     Ordered   05/01/20 1758  Full code  Continuous        05/01/20 1757        Code Status History    Date Active Date Inactive Code Status Order ID Comments User Context   01/24/2019 0304 01/28/2019 2305 Full Code [id number]  Fritzi Mandes, MD ED   Advance Care Planning Activity        IV Access:    Peripheral IV   Procedures and diagnostic studies:   No results found.   Medical Consultants:    None.   Subjective:    [name] tolerating her diet this morning very emotional.  Objective:    Vitals:   05/08/20 1940 05/09/20 0052 05/09/20 0333 05/09/20 0812  BP: (!) 152/99 128/78 121/78 (!) 151/97  Pulse: 66 62 64 65  Resp: 16 14 18 16   Temp: 98.4 F (36.9 C) (!) 97.4 F (36.3 C) 97.7 F (36.5 C) 97.9 F (36.6 C)  TempSrc: Oral Oral Oral   SpO2: 97% 92% 91% 92%  Weight:      Height:       SpO2: 92 %   Intake/Output Summary (Last 24 hours) at 05/09/2020 2778 Last data filed at 05/08/2020 1024 Gross per 24 hour  Intake 240 ml  Output --  Net 240 ml   [employer]   05/01/20 1419  Weight: 65.3 kg    Exam: General exam: In no acute distress. Respiratory system: Good air movement and clear to auscultation. Cardiovascular system: S1 & S2 heard, RRR. No JVD. Gastrointestinal system: Abdomen is nondistended, soft and nontender.  Extremities: No pedal edema. Skin: No rashes, lesions or ulcers Psychiatry: Judgement and insight appear normal. Mood & affect appropriate.  Data Reviewed:    Labs: Basic Metabolic Panel: Recent Labs  Lab 05/04/20 0446 05/05/20 0554  NA 137 137  K 3.9 3.6  CL 96* 97*  CO2 28 29  GLUCOSE 89 92  BUN 11 9  CREATININE 0.75 0.64  CALCIUM 8.9 8.7*  MG 1.8 2.2  PHOS 3.0  --     GFR Estimated Creatinine Clearance: 82 mL/min (by C-G formula based on SCr of 0.64 mg/dL). Liver Function Tests: Recent Labs  Lab 05/04/20 0446  AST 13*  ALT 12  ALKPHOS 110  BILITOT 1.1  PROT 6.5  ALBUMIN 3.3*   No results for input(s): LIPASE, AMYLASE in the last 168 hours. No results for input(s): AMMONIA in the last 168 hours. Coagulation profile No results for input(s): INR, PROTIME in the last 168 hours. COVID-19 Labs  No results for input(s): DDIMER, FERRITIN, LDH, CRP in the last 72 hours.  Lab Results  Component Value Date   SARSCOV2NAA NEGATIVE 05/01/2020   [place] NEGATIVE 01/24/2019    CBC: Recent Labs  Lab 05/04/20 0446 05/05/20 0554  WBC 12.1* 10.3  NEUTROABS 9.4* 6.9  HGB 13.2 13.0  HCT 39.3 36.9  MCV 88.3 85.6  PLT 297 299   Cardiac Enzymes: No results for input(s): CKTOTAL, CKMB, CKMBINDEX, TROPONINI in the last 168 hours. BNP (last 3 results) No results for input(s): PROBNP in the last 8760 hours. CBG: No results for input(s): GLUCAP in the last 168 hours. D-Dimer: No results for input(s): DDIMER in the last 72 hours. Hgb A1c: No results for input(s): HGBA1C in the last 72 hours. Lipid Profile: No results for input(s): CHOL, HDL, LDLCALC, TRIG, CHOLHDL, LDLDIRECT in the last 72 hours. Thyroid function studies: No results for input(s): TSH, T4TOTAL, T3FREE, THYROIDAB in the last 72 hours.  Invalid input(s): FREET3 Anemia work up: No results for input(s): VITAMINB12, FOLATE, FERRITIN, TIBC, IRON, RETICCTPCT in the last 72 hours. Sepsis Labs: Recent Labs  Lab 05/04/20 0446 05/05/20 0554  WBC 12.1* 10.3   Microbiology Recent Results (from the past 240 hour(s))  Resp Panel by RT-PCR (Flu A&B, Covid) Nasopharyngeal Swab     Status: None   Collection Time: 05/01/20  2:24 PM   Specimen: Nasopharyngeal Swab; Nasopharyngeal(NP) swabs in vial transport medium  Result Value Ref Range Status   SARS Coronavirus 2 by RT PCR NEGATIVE  NEGATIVE Final    Comment: (NOTE) SARS-CoV-2 target nucleic acids are NOT DETECTED.  The SARS-CoV-2 RNA is generally detectable in upper respiratory specimens during the acute phase of infection. The lowest concentration of SARS-CoV-2 viral copies this assay can detect is 138 copies/mL. A negative result does not preclude SARS-Cov-2 infection and should not be used as the sole basis for treatment or other patient management decisions. A negative result may occur with  improper specimen collection/handling, submission of specimen other than nasopharyngeal swab, presence of viral mutation(s) within the areas targeted by this assay, and inadequate number of viral copies(<138 copies/mL). A negative result must be combined with clinical observations, patient history, and epidemiological information. The expected result is Negative.  Fact Sheet for Patients:  EntrepreneurPulse.com.au  Fact Sheet for Healthcare Providers:  IncredibleEmployment.be  This test is no t yet approved or cleared by the [place] FDA and  has been authorized for detection and/or diagnosis of SARS-CoV-2 by FDA under an Emergency Use Authorization (EUA). This EUA will remain  in effect (meaning this test can be used) for the duration of the COVID-19 declaration under Section 564(b)(1) of the Act, 21 U.S.C.section 360bbb-3(b)(1), unless the authorization is terminated  or revoked sooner.       Influenza A by PCR NEGATIVE NEGATIVE Final   Influenza B by PCR NEGATIVE NEGATIVE Final    Comment: (NOTE) The Xpert Xpress SARS-CoV-2/FLU/RSV plus assay is intended as an aid in the diagnosis of influenza from Nasopharyngeal swab specimens and should not be used as a sole basis for treatment. Nasal washings and aspirates are unacceptable for Xpert Xpress SARS-CoV-2/FLU/RSV testing.  Fact Sheet for Patients: EntrepreneurPulse.com.au  Fact Sheet for Healthcare  Providers: IncredibleEmployment.be  This test is not yet approved or cleared by the [place] FDA and has been authorized for detection and/or diagnosis of SARS-CoV-2 by FDA under an Emergency Use Authorization (EUA). This EUA will remain in effect (meaning this test can be used) for the duration of the COVID-19 declaration under Section 564(b)(1) of the Act, 21 U.S.C. section 360bbb-3(b)(1), unless the authorization is terminated or revoked.  Performed at [facility], [address] [place], [place] [address]   KOH prep     Status: None   Collection Time: 05/07/20 11:00 AM   Specimen: Bronchial Brush  Result Value Ref Range Status   Specimen Description BRONCHIAL BRUSHING  Final   Special Requests NONE  Final   KOH Prep   Final    NO YEAST OR FUNGAL ELEMENTS SEEN Performed at [facility], [address], [place],  [address]    Report Status 05/07/2020 FINAL  Final     Medications:   . carisoprodol  700 mg Oral BID  . feeding supplement  1 Container Oral TID BM  . hydrocortisone cream   Topical BID  . lisinopril  10 mg Oral Daily  . oxyCODONE  5 mg Oral BID  . pantoprazole  40 mg Oral BID AC  . polyethylene glycol  17 g Oral BID  . sucralfate  1 g Oral TID WC & HS  . umeclidinium bromide  1 puff Inhalation Daily  . venlafaxine XR  150 mg Oral Q breakfast   Continuous Infusions: . sodium chloride 10 mL/hr at 05/07/20 1036  . lactated ringers 100 mL/hr at 05/08/20 2248      LOS: 8 days   [facility]  05/09/2020, 9:22 AM

## 2020-05-09 NOTE — Plan of Care (Signed)
  Problem: Education: Goal: Knowledge of General Education information will improve Description Including pain rating scale, medication(s)/side effects and non-pharmacologic comfort measures Outcome: Progressing   

## 2020-05-10 DIAGNOSIS — D72829 Elevated white blood cell count, unspecified: Secondary | ICD-10-CM | POA: Diagnosis not present

## 2020-05-10 DIAGNOSIS — K219 Gastro-esophageal reflux disease without esophagitis: Secondary | ICD-10-CM | POA: Diagnosis not present

## 2020-05-10 DIAGNOSIS — K2210 Ulcer of esophagus without bleeding: Secondary | ICD-10-CM | POA: Diagnosis not present

## 2020-05-10 MED ORDER — LIDOCAINE VISCOUS HCL 2 % MT SOLN: 15.0000 mL | OROMUCOSAL | 3 refills | Status: DC | PRN

## 2020-05-10 MED ORDER — DESVENLAFAXINE SUCCINATE ER 100 MG PO TB24: 100.0000 mg | ORAL_TABLET | Freq: Every day | ORAL | 3 refills | Status: DC

## 2020-05-10 MED ORDER — LORAZEPAM 1 MG PO TABS: 1.0000 mg | ORAL_TABLET | Freq: Four times a day (QID) | ORAL | 0 refills | Status: DC | PRN

## 2020-05-10 MED ORDER — PANTOPRAZOLE SODIUM 40 MG PO TBEC: 40.0000 mg | DELAYED_RELEASE_TABLET | Freq: Two times a day (BID) | ORAL | 0 refills | Status: DC

## 2020-05-10 MED ORDER — SUCRALFATE 1 GM/10ML PO SUSP: 1.0000 g | Freq: Three times a day (TID) | ORAL | 0 refills | Status: DC

## 2020-05-10 NOTE — Progress Notes (Signed)
Patient is being discharged home.  Discharge papers given and explained to pt.  Pt verbalized understanding.  Meds and f/u appointments reviewed.  Rx given.

## 2020-05-10 NOTE — Discharge Summary (Signed)
Physician Discharge Summary  [name] ZTI:[id number] DOB: [birth year] DOA: 05/01/2020  PCP: [name], PA-C  Admit date: 05/01/2020 Discharge date: 05/10/2020  Admitted From: Home Disposition:  Home  Recommendations for Outpatient Follow-up:  1. Follow up with PCP in 1-2 weeks 2. Please obtain BMP/CBC in one week   Home Health:No Equipment/Devices:None  Discharge Condition:Stable CODE STATUS:Full Diet recommendation: Heart Healthy  Brief/Interim Summary: 55 y.o. female past medical history of COPD, chronic pain syndrome, history of traumatic brain injury essential hypertension comes into the ED with 2 days of abdominal pain nausea and vomiting in the emergency room she was hemodynamically stable was found to have a small bowel obstruction Significant studies: 05/01/2020 CT scan of the abdomen and pelvis consistent with a small bowel obstruction. 06/02/2020 abdominal x-ray showed improved partial small bowel obstruction NG tube in place.    Procedures: 05/07/2020 EGD that showed normal duodenum bulb second portion of the duodenum, some erythematous mucosa of the gastric body and an esophageal ulcer nonbleeding with no stigmata of bleeding. 05/07/2020 colonoscopy there was an inadequate preparation showed normal ileum relatively normal mucosa. 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 GI was consulted to perform an EGD that showed large ulcer disease softeners they recommended Protonix and sucralfate. She was started on a full  liquid diet which she tolerated she was advanced she was given lidocaine viscous gel to take before each meal which she tolerated in the hospital.  Essential hypertension: No changes to maintain his medication  Chronic pain syndrome: Continue her medication no changes made.  COPD: Stable.  GERD: Continue PPI.   Discharge Instructions  Discharge Instructions    Diet - low sodium heart healthy   Complete by: As directed    Increase activity slowly   Complete by: As directed      Allergies as of 05/10/2020      Reactions   Contrast Media [iodinated Diagnostic Agents]    Diphenhydramine Hcl Hives, Itching   Adhesive [tape]    PAPER TAPE OK   Iodine Other (See Comments)   Prednisone Anxiety   Sulfa Antibiotics Rash      Medication List    TAKE these medications   albuterol 108 (90 Base) MCG/ACT inhaler Commonly known as: VENTOLIN HFA INL 2 PFS PO Q 4 TO 6 H IF NEEDED   butalbital-acetaminophen-caffeine 50-325-40 MG tablet Commonly known as: FIORICET Take 1 tablet by mouth every 6 (six) hours as needed for headache.   carisoprodol 350 MG tablet Commonly known as: SOMA Take 700 mg by mouth 2 (two) times daily.   desvenlafaxine 100 MG 24 hr tablet Commonly known as: Pristiq Take 1 tablet (100 mg total) by mouth daily.   DSS 100 MG Caps TK ONE C PO BID PRF MILD CONSTIPATION   Emgality 120 MG/ML Soaj Generic drug: Galcanezumab-gnlm Inject 1 mL into the muscle every 28 (twenty-eight) days.   fexofenadine 180 MG tablet Commonly known as: ALLEGRA Take 180 mg by mouth daily.   lidocaine 2 % solution Commonly known as: XYLOCAINE Use  as directed 15 mLs in the mouth or throat every 4 (four) hours as needed for mouth pain.   lisinopril-hydrochlorothiazide 10-12.5 MG tablet Commonly known as: ZESTORETIC TAKE 1 TABLET BY MOUTH EVERY DAY   LORazepam 1 MG tablet Commonly known as: ATIVAN Take 1 tablet (1 mg total) by mouth every 6 (six) hours as needed for  anxiety.   oxyCODONE 5 MG immediate release tablet Commonly known as: Oxy IR/ROXICODONE Take 5 mg by mouth 2 (two) times daily.   pantoprazole 40 MG tablet Commonly known as: PROTONIX Take 1 tablet (40 mg total) by mouth 2 (two) times daily.   Spiriva Respimat 2.5 MCG/ACT Aers Generic drug: Tiotropium Bromide Monohydrate Inhale 2 puffs into the lungs daily.   sucralfate 1 GM/10ML suspension Commonly known as: CARAFATE Take 10 mLs (1 g total) by mouth 4 (four) times daily -  with meals and at bedtime.       Allergies  Allergen Reactions  . Contrast Media [Iodinated Diagnostic Agents]   . Diphenhydramine Hcl Hives and Itching  . Adhesive [Tape]     PAPER TAPE OK  . Iodine Other (See Comments)  . Prednisone Anxiety  . Sulfa Antibiotics Rash    Consultations:  General surgery  Gastroenterology   Procedures/Studies: CT ABDOMEN PELVIS WO CONTRAST  Result Date: 05/01/2020 CLINICAL DATA:  Nausea, vomiting and diarrhea x2 days. EXAM: CT ABDOMEN AND PELVIS WITHOUT CONTRAST TECHNIQUE: Multidetector CT imaging of the abdomen and pelvis was performed following the standard protocol without IV contrast. COMPARISON:  January 30, 2019 FINDINGS: Lower chest: No acute abnormality. Hepatobiliary: No focal liver abnormality is seen. No gallstones, gallbladder wall thickening, or biliary dilatation. Pancreas: Unremarkable. No pancreatic ductal dilatation or surrounding inflammatory changes. Spleen: Normal in size without focal abnormality. Adrenals/Urinary Tract: Adrenal glands are unremarkable. Kidneys are normal, without renal calculi, focal lesion, or hydronephrosis. The urinary bladder is empty and subsequently limited in evaluation. Stomach/Bowel: Stomach is within normal limits. The appendix is surgically absent. Multiple mildly dilated small bowel loops are seen within the abdomen (maximum small bowel diameter of approximately 2.9 cm). A gradual transition zone is seen within the right  lower quadrant (axial CT images 46 through 50, CT series number 2) Vascular/Lymphatic: Aortic atherosclerosis. No enlarged abdominal or pelvic lymph nodes. Reproductive: Uterus and bilateral adnexa are unremarkable. Other: No abdominal wall hernia or abnormality. No abdominopelvic ascites or free air. Musculoskeletal: Approximately 2 mm anterolisthesis of the L4 vertebral body is noted on L5. IMPRESSION: 1. Findings consistent with a partial small bowel obstruction. 2. Aortic atherosclerosis. Aortic Atherosclerosis (ICD10-I70.0). Electronically Signed   By: Virgina Norfolk M.D.   On: 05/01/2020 15:41   DG Abd 1 View  Result Date: 05/04/2020 CLINICAL DATA:  Nasogastric tube placement EXAM: ABDOMEN - 1 VIEW COMPARISON:  05/02/2020 FINDINGS: Nasogastric tube tip overlies the expected proximal to mid body of the stomach. The visualized abdominal gas pattern is unremarkable. Lung bases are clear. IMPRESSION: Nasogastric tube tip within the proximal to mid body of the stomach. Electronically Signed   By: Fidela Salisbury MD   On: 05/04/2020 07:02   DG Abdomen 1 View  Result Date: 05/01/2020 CLINICAL DATA:  Check gastric catheter placement EXAM: ABDOMEN - 1 VIEW COMPARISON:  None. FINDINGS: Gastric catheter is noted extending into the stomach. No free air is seen. IMPRESSION: Gastric catheter within the stomach. Electronically Signed   By: Inez Catalina M.D.   On: 05/01/2020 16:57   DG Abd 2 Views  Result Date: 05/02/2020  CLINICAL DATA:  Check gastric catheter placement EXAM: ABDOMEN - 2 VIEW COMPARISON:  05/01/2020 FINDINGS: Gastric catheter is noted within the stomach. Scattered colonic gas is seen without obstructive change. No free air is noted. Dilated small bowel seen on prior CT examination is not well appreciated on this exam. IMPRESSION: Previously seen partial small bowel obstruction not as well appreciated on this exam. Gastric catheter remains in the stomach. Electronically Signed   By: Inez Catalina  M.D.   On: 05/02/2020 07:40   US Abdomen Limited RUQ (LIVER/GB)  Result Date: 05/02/2020 CLINICAL DATA:  Right upper quadrant tenderness over the last 2 days. EXAM: ULTRASOUND ABDOMEN LIMITED RIGHT UPPER QUADRANT COMPARISON:  CT 05/01/2020 FINDINGS: Gallbladder: No gallstones or wall thickening visualized. No sonographic Murphy sign noted by sonographer. Common bile duct: Diameter: 3 mm, normal Liver: No focal lesion identified. Within normal limits in parenchymal echogenicity. Portal vein is patent on color Doppler imaging with normal direction of blood flow towards the liver. Other: None. IMPRESSION: Normal right upper quadrant ultrasound. Electronically Signed   By: Nelson Chimes M.D.   On: 05/02/2020 15:26      Subjective: No complains  Discharge Exam: Vitals:   05/10/20 0449 05/10/20 0737  BP: (!) 131/93 (!) 137/92  Pulse: 63 62  Resp: 16 14  Temp: 97.6 F (36.4 C) 97.8 F (36.6 C)  SpO2: 93% 92%   Vitals:   05/09/20 2105 05/10/20 0111 05/10/20 0449 05/10/20 0737  BP: (!) 153/94 (!) 146/91 (!) 131/93 (!) 137/92  Pulse: 65 64 63 62  Resp: 16 16 16 14   Temp: 98.1 F (36.7 C) 98 F (36.7 C) 97.6 F (36.4 C) 97.8 F (36.6 C)  TempSrc: Oral Oral Oral Oral  SpO2: 92% 92% 93% 92%  Weight:      Height:        General: Pt is alert, awake, not in acute distress Cardiovascular: RRR, S1/S2 +, no rubs, no gallops Respiratory: CTA bilaterally, no wheezing, no rhonchi Abdominal: Soft, NT, ND, bowel sounds + Extremities: no edema, no cyanosis    The results of significant diagnostics from this hospitalization (including imaging, microbiology, ancillary and laboratory) are listed below for reference.     Microbiology: Recent Results (from the past 240 hour(s))  Resp Panel by RT-PCR (Flu A&B, Covid) Nasopharyngeal Swab     Status: None   Collection Time: 05/01/20  2:24 PM   Specimen: Nasopharyngeal Swab; Nasopharyngeal(NP) swabs in vial transport medium  Result Value Ref Range  Status   SARS Coronavirus 2 by RT PCR NEGATIVE NEGATIVE Final    Comment: (NOTE) SARS-CoV-2 target nucleic acids are NOT DETECTED.  The SARS-CoV-2 RNA is generally detectable in upper respiratory specimens during the acute phase of infection. The lowest concentration of SARS-CoV-2 viral copies this assay can detect is 138 copies/mL. A negative result does not preclude SARS-Cov-2 infection and should not be used as the sole basis for treatment or other patient management decisions. A negative result may occur with  improper specimen collection/handling, submission of specimen other than nasopharyngeal swab, presence of viral mutation(s) within the areas targeted by this assay, and inadequate number of viral copies(<138 copies/mL). A negative result must be combined with clinical observations, patient history, and epidemiological information. The expected result is Negative.  Fact Sheet for Patients:  EntrepreneurPulse.com.au  Fact Sheet for Healthcare Providers:  IncredibleEmployment.be  This test is no t yet approved or cleared by the [place] FDA and  has been authorized for detection and/or  diagnosis of SARS-CoV-2 by FDA under an Emergency Use Authorization (EUA). This EUA will remain  in effect (meaning this test can be used) for the duration of the COVID-19 declaration under Section 564(b)(1) of the Act, 21 U.S.C.section 360bbb-3(b)(1), unless the authorization is terminated  or revoked sooner.       Influenza A by PCR NEGATIVE NEGATIVE Final   Influenza B by PCR NEGATIVE NEGATIVE Final    Comment: (NOTE) The Xpert Xpress SARS-CoV-2/FLU/RSV plus assay is intended as an aid in the diagnosis of influenza from Nasopharyngeal swab specimens and should not be used as a sole basis for treatment. Nasal washings and aspirates are unacceptable for Xpert Xpress SARS-CoV-2/FLU/RSV testing.  Fact Sheet for  Patients: EntrepreneurPulse.com.au  Fact Sheet for Healthcare Providers: IncredibleEmployment.be  This test is not yet approved or cleared by the [place] FDA and has been authorized for detection and/or diagnosis of SARS-CoV-2 by FDA under an Emergency Use Authorization (EUA). This EUA will remain in effect (meaning this test can be used) for the duration of the COVID-19 declaration under Section 564(b)(1) of the Act, 21 U.S.C. section 360bbb-3(b)(1), unless the authorization is terminated or revoked.  Performed at [facility], [address] [place], [place] [address]   KOH prep     Status: None   Collection Time: 05/07/20 11:00 AM   Specimen: Bronchial Brush  Result Value Ref Range Status   Specimen Description BRONCHIAL BRUSHING  Final   Special Requests NONE  Final   KOH Prep   Final    NO YEAST OR FUNGAL ELEMENTS SEEN Performed at [facility], [address] [place], [place] [address]    Report Status 05/07/2020 FINAL  Final     Labs: BNP (last 3 results) No results for input(s): BNP in the last 8760 hours. Basic Metabolic Panel: Recent Labs  Lab 05/04/20 0446 05/05/20 0554  NA 137 137  K 3.9 3.6  CL 96* 97*  CO2 28 29  GLUCOSE 89 92  BUN 11 9  CREATININE 0.75 0.64  CALCIUM 8.9 8.7*  MG 1.8 2.2  PHOS 3.0  --    Liver Function Tests: Recent Labs  Lab 05/04/20 0446  AST 13*  ALT 12  ALKPHOS 110  BILITOT 1.1  PROT 6.5  ALBUMIN 3.3*   No results for input(s): LIPASE, AMYLASE in the last 168 hours. No results for input(s): AMMONIA in the last 168 hours. CBC: Recent Labs  Lab 05/04/20 0446 05/05/20 0554  WBC 12.1* 10.3  NEUTROABS 9.4* 6.9  HGB 13.2 13.0  HCT 39.3 36.9  MCV 88.3 85.6  PLT 297 299   Cardiac Enzymes: No results for input(s): CKTOTAL, CKMB, CKMBINDEX, TROPONINI in the last 168 hours. BNP: Invalid input(s): POCBNP CBG: No results for input(s): GLUCAP in the last  168 hours. D-Dimer No results for input(s): DDIMER in the last 72 hours. Hgb A1c No results for input(s): HGBA1C in the last 72 hours. Lipid Profile No results for input(s): CHOL, HDL, LDLCALC, TRIG, CHOLHDL, LDLDIRECT in the last 72 hours. Thyroid function studies No results for input(s): TSH, T4TOTAL, T3FREE, THYROIDAB in the last 72 hours.  Invalid input(s): FREET3 Anemia work up No results for input(s): VITAMINB12, FOLATE, FERRITIN, TIBC, IRON, RETICCTPCT in the last 72 hours. Urinalysis    Component Value Date/Time   COLORURINE AMBER (A) 05/01/2020 2041   APPEARANCEUR HAZY (A) 05/01/2020 2041   APPEARANCEUR Cloudy 12/17/2013 0150   LABSPEC 1.027 05/01/2020 2041   LABSPEC 1.026 12/17/2013 0150  PHURINE 5.0 05/01/2020 2041   GLUCOSEU NEGATIVE 05/01/2020 2041   GLUCOSEU Negative 12/17/2013 0150   HGBUR SMALL (A) 05/01/2020 2041   BILIRUBINUR NEGATIVE 05/01/2020 2041   BILIRUBINUR Negative 12/17/2013 0150   KETONESUR NEGATIVE 05/01/2020 2041   PROTEINUR 30 (A) 05/01/2020 2041   NITRITE NEGATIVE 05/01/2020 2041   LEUKOCYTESUR NEGATIVE 05/01/2020 2041   LEUKOCYTESUR 3+ 12/17/2013 0150   Sepsis Labs Invalid input(s): PROCALCITONIN,  WBC,  LACTICIDVEN Microbiology Recent Results (from the past 240 hour(s))  Resp Panel by RT-PCR (Flu A&B, Covid) Nasopharyngeal Swab     Status: None   Collection Time: 05/01/20  2:24 PM   Specimen: Nasopharyngeal Swab; Nasopharyngeal(NP) swabs in vial transport medium  Result Value Ref Range Status   SARS Coronavirus 2 by RT PCR NEGATIVE NEGATIVE Final    Comment: (NOTE) SARS-CoV-2 target nucleic acids are NOT DETECTED.  The SARS-CoV-2 RNA is generally detectable in upper respiratory specimens during the acute phase of infection. The lowest concentration of SARS-CoV-2 viral copies this assay can detect is 138 copies/mL. A negative result does not preclude SARS-Cov-2 infection and should not be used as the sole basis for treatment or other  patient management decisions. A negative result may occur with  improper specimen collection/handling, submission of specimen other than nasopharyngeal swab, presence of viral mutation(s) within the areas targeted by this assay, and inadequate number of viral copies(<138 copies/mL). A negative result must be combined with clinical observations, patient history, and epidemiological information. The expected result is Negative.  Fact Sheet for Patients:  EntrepreneurPulse.com.au  Fact Sheet for Healthcare Providers:  IncredibleEmployment.be  This test is no t yet approved or cleared by the [place] FDA and  has been authorized for detection and/or diagnosis of SARS-CoV-2 by FDA under an Emergency Use Authorization (EUA). This EUA will remain  in effect (meaning this test can be used) for the duration of the COVID-19 declaration under Section 564(b)(1) of the Act, 21 U.S.C.section 360bbb-3(b)(1), unless the authorization is terminated  or revoked sooner.       Influenza A by PCR NEGATIVE NEGATIVE Final   Influenza B by PCR NEGATIVE NEGATIVE Final    Comment: (NOTE) The Xpert Xpress SARS-CoV-2/FLU/RSV plus assay is intended as an aid in the diagnosis of influenza from Nasopharyngeal swab specimens and should not be used as a sole basis for treatment. Nasal washings and aspirates are unacceptable for Xpert Xpress SARS-CoV-2/FLU/RSV testing.  Fact Sheet for Patients: EntrepreneurPulse.com.au  Fact Sheet for Healthcare Providers: IncredibleEmployment.be  This test is not yet approved or cleared by the [place] FDA and has been authorized for detection and/or diagnosis of SARS-CoV-2 by FDA under an Emergency Use Authorization (EUA). This EUA will remain in effect (meaning this test can be used) for the duration of the COVID-19 declaration under Section 564(b)(1) of the Act, 21 U.S.C. section  360bbb-3(b)(1), unless the authorization is terminated or revoked.  Performed at [facility], [address] [place], [place] [address]   KOH prep     Status: None   Collection Time: 05/07/20 11:00 AM   Specimen: Bronchial Brush  Result Value Ref Range Status   Specimen Description BRONCHIAL BRUSHING  Final   Special Requests NONE  Final   KOH Prep   Final    NO YEAST OR FUNGAL ELEMENTS SEEN Performed at [facility], [address], [place], [place] [address]    Report Status 05/07/2020 FINAL  Final     Time coordinating discharge: Over 30 minutes  SIGNED:   Charlynne Cousins, MD  [facility] 05/10/2020, 10:05 AM Pager   If 7PM-7AM, please contact night-coverage [email or web address] Password [email or web address]

## 2020-05-12 ENCOUNTER — Telehealth: Payer: Self-pay

## 2020-05-12 NOTE — Telephone Encounter (Signed)
Transition Care Management Unsuccessful Follow-up Telephone Call  Date of discharge and from where:  05/10/20 [facility]  Attempts:  1st Attempt  Reason for unsuccessful TCM follow-up call:  Left voice message for pt to reach me at [phone] or contact the office

## 2020-05-12 NOTE — Telephone Encounter (Signed)
Transition Care Management Follow-up Telephone Call  Date of discharge and from where: 05/10/20 [facility]  How have you been since you were released from the hospital? Pt states she is still having pain in abdomen 7/10.   Any questions or concerns? No  Items Reviewed:  Did the pt receive and understand the discharge instructions provided? Yes   Medications obtained and verified? Yes   Other? No   Any new allergies since your discharge? No   Dietary orders reviewed? Yes  Do you have support at home? Yes   Home Care and Equipment/Supplies: Were home health services ordered? no Were any new equipment or medical supplies ordered?  No  Functional Questionnaire: (I = Independent and D = Dependent) ADLs: I  Bathing/Dressing- I  Meal Prep- I  Eating- I  Maintaining continence- I  Transferring/Ambulation- I  Managing Meds- I  Follow up appointments reviewed:   PCP Hospital f/u appt confirmed? No  PCP schedule full; advised front desk would contact if appt available  [facility] f/u appt confirmed? Yes  Scheduled to see Dr. Vicente Males on 05/21/20 @ 11:15.  Are transportation arrangements needed? No   If their condition worsens, is the pt aware to call PCP or go to the Emergency Dept.? Yes  Was the patient provided with contact information for the PCP's office or ED? Yes  Was to pt encouraged to call back with questions or concerns? Yes

## 2020-05-16 ENCOUNTER — Ambulatory Visit: Payer: 59

## 2020-05-21 ENCOUNTER — Other Ambulatory Visit: Payer: Self-pay

## 2020-05-21 ENCOUNTER — Encounter: Payer: Self-pay | Admitting: Gastroenterology

## 2020-05-21 ENCOUNTER — Other Ambulatory Visit: Admission: RE | Admit: 2020-05-21 | Payer: 59 | Source: Ambulatory Visit

## 2020-05-21 ENCOUNTER — Ambulatory Visit (INDEPENDENT_AMBULATORY_CARE_PROVIDER_SITE_OTHER): Payer: 59 | Admitting: Gastroenterology

## 2020-05-21 VITALS — BP 113/80 | HR 106 | Ht 68.0 in | Wt 139.6 lb

## 2020-05-21 DIAGNOSIS — K3184 Gastroparesis: Secondary | ICD-10-CM

## 2020-05-21 DIAGNOSIS — Z1211 Encounter for screening for malignant neoplasm of colon: Secondary | ICD-10-CM

## 2020-05-21 DIAGNOSIS — R748 Abnormal levels of other serum enzymes: Secondary | ICD-10-CM | POA: Diagnosis not present

## 2020-05-21 MED ORDER — NA SULFATE-K SULFATE-MG SULF 17.5-3.13-1.6 GM/177ML PO SOLN: 1.0000 | Freq: Once | ORAL | 0 refills | Status: AC

## 2020-05-21 NOTE — Progress Notes (Signed)
[name] MD, MRCP(U.K) [address]  [name]  [name], [place] [address]  Main: [phone]  Fax: [phone]   Primary Care Physician: [name], PA-C  Primary Gastroenterologist:  Dr. [name]   Follow-up for abdominal pain and constipation  HPI: [name] is a 55 y.o. female   Summary of history :  Initially referred and seen on 03/20/2020 for abdominal pain bloating and constipation. X-ray of the abdomen in November 2021 showed mild to moderate increasing colonic stool burden.  CT scan of the abdomen in December 2021 demonstrated resolution of previously seen small bowel obstruction, moderate constipation.November 2021: TSH normal, hemoglobin 15.8, CMP normal except an elevated calcium and alkaline phosphatase.  At her initial visit she had complained of a sense of bloating and early satiety she had been on oxycodone.  History of traumatic brain injury.   Interval history 03/20/2020-05/21/2020  She was admitted in March 2022 with abdominal pain nausea vomiting and found to have a small bowel obstruction.  EGD showed an esophageal ulcer with no stigmata of bleeding colonoscopy was attempted but inadequate preparation.She was seen by general surgery.   03/20/2020: H. pylori breath test negative.  PTH normal, GGT 24 normal, fractionated alkaline phosphatase normal.  ANA negative.  AMA negative.  CMP showed isolated elevation of alkaline phosphatase at 179.  05/01/2020 CT abdomen and pelvis without contrast demonstrated findings consistent with partial small bowel obstruction 05/02/2020: Right upper quadrant ultrasound normal   Recently denies any nausea vomiting she denies any constipation.  Still on oxycodone.  Taking her Protonix 40 mg twice a day.  Continues to smoke. Current Outpatient Medications  Medication Sig Dispense Refill  . albuterol (VENTOLIN HFA) 108 (90 Base) MCG/ACT inhaler INL 2 PFS PO Q 4 TO 6 H IF NEEDED 18 g 3  .  butalbital-acetaminophen-caffeine (FIORICET) 50-325-40 MG tablet Take 1 tablet by mouth every 6 (six) hours as needed for headache. 30 tablet 0  . carisoprodol (SOMA) 350 MG tablet Take 700 mg by mouth 2 (two) times daily.     Marland Kitchen desvenlafaxine (PRISTIQ) 100 MG 24 hr tablet Take 1 tablet (100 mg total) by mouth daily. 30 tablet 3  . fexofenadine (ALLEGRA) 180 MG tablet Take 180 mg by mouth daily.     Marland Kitchen lidocaine (XYLOCAINE) 2 % solution Use as directed 15 mLs in the mouth or throat every 4 (four) hours as needed for mouth pain. 120 mL 3  . lisinopril-hydrochlorothiazide (ZESTORETIC) 10-12.5 MG tablet TAKE 1 TABLET BY MOUTH EVERY DAY 90 tablet 0  . LORazepam (ATIVAN) 1 MG tablet Take 1 tablet (1 mg total) by mouth every 6 (six) hours as needed for anxiety. 15 tablet 0  . Na Sulfate-K Sulfate-Mg Sulf 17.5-3.13-1.6 GM/177ML SOLN Take 1 kit by mouth once for 1 dose. 354 mL 0  . oxyCODONE (OXY IR/ROXICODONE) 5 MG immediate release tablet Take 5 mg by mouth 2 (two) times daily.    . pantoprazole (PROTONIX) 40 MG tablet Take 1 tablet (40 mg total) by mouth 2 (two) times daily. 180 tablet 0  . sucralfate (CARAFATE) 1 GM/10ML suspension Take 10 mLs (1 g total) by mouth 4 (four) times daily -  with meals and at bedtime. 420 mL 0  . Tiotropium Bromide Monohydrate (SPIRIVA RESPIMAT) 2.5 MCG/ACT AERS Inhale 2 puffs into the lungs daily. 4 g 5  . Docusate Sodium (DSS) 100 MG CAPS TK ONE C PO BID PRF MILD CONSTIPATION (Patient not taking: No sig reported)    .  EMGALITY 120 MG/ML SOAJ Inject 1 mL into the muscle every 28 (twenty-eight) days. (Patient not taking: No sig reported)     No current facility-administered medications for this visit.    Allergies as of 05/21/2020 - Review Complete 05/21/2020  Allergen Reaction Noted  . Contrast media [iodinated diagnostic agents]  01/08/2016  . Diphenhydramine hcl Hives and Itching 04/15/2015  . Adhesive [tape]  01/01/2015  . Iodine Other (See Comments) 05/05/2016  .  Prednisone Anxiety 07/27/2017  . Sulfa antibiotics Rash 07/04/2012    ROS:  General: Negative for anorexia, weight loss, fever, chills, fatigue, weakness. ENT: Negative for hoarseness, difficulty swallowing , nasal congestion. CV: Negative for chest pain, angina, palpitations, dyspnea on exertion, peripheral edema.  Respiratory: Negative for dyspnea at rest, dyspnea on exertion, cough, sputum, wheezing.  GI: See history of present illness. GU:  Negative for dysuria, hematuria, urinary incontinence, urinary frequency, nocturnal urination.  Endo: Negative for unusual weight change.    Physical Examination:   BP 113/80 (BP Location: Left Arm, Patient Position: Sitting, Cuff Size: Normal)   Pulse (!) 106   Ht 5' 8" (1.727 m)   Wt 139 lb 9.6 oz (63.3 kg)   BMI 21.23 kg/m   General: Well-nourished, well-developed in no acute distress.  Eyes: No icterus. Conjunctivae pink. Mouth: Oropharyngeal mucosa moist and pink , no lesions erythema or exudate. Lungs: Clear to auscultation bilaterally. Non-labored. Heart: Regular rate and rhythm, no murmurs rubs or gallops.  Abdomen: Bowel sounds are normal, nontender, nondistended, no hepatosplenomegaly or masses, no abdominal bruits or hernia , no rebound or guarding.   Extremities: No lower extremity edema. No clubbing or deformities. Neuro: Alert and oriented x 3.  Grossly intact. Skin: Warm and dry, no jaundice.   Psych: Alert and cooperative, normal mood and affect.   Imaging Studies: CT ABDOMEN PELVIS WO CONTRAST  Result Date: 05/01/2020 CLINICAL DATA:  Nausea, vomiting and diarrhea x2 days. EXAM: CT ABDOMEN AND PELVIS WITHOUT CONTRAST TECHNIQUE: Multidetector CT imaging of the abdomen and pelvis was performed following the standard protocol without IV contrast. COMPARISON:  January 30, 2019 FINDINGS: Lower chest: No acute abnormality. Hepatobiliary: No focal liver abnormality is seen. No gallstones, gallbladder wall thickening, or biliary  dilatation. Pancreas: Unremarkable. No pancreatic ductal dilatation or surrounding inflammatory changes. Spleen: Normal in size without focal abnormality. Adrenals/Urinary Tract: Adrenal glands are unremarkable. Kidneys are normal, without renal calculi, focal lesion, or hydronephrosis. The urinary bladder is empty and subsequently limited in evaluation. Stomach/Bowel: Stomach is within normal limits. The appendix is surgically absent. Multiple mildly dilated small bowel loops are seen within the abdomen (maximum small bowel diameter of approximately 2.9 cm). A gradual transition zone is seen within the right lower quadrant (axial CT images 46 through 50, CT series number 2) Vascular/Lymphatic: Aortic atherosclerosis. No enlarged abdominal or pelvic lymph nodes. Reproductive: Uterus and bilateral adnexa are unremarkable. Other: No abdominal wall hernia or abnormality. No abdominopelvic ascites or free air. Musculoskeletal: Approximately 2 mm anterolisthesis of the L4 vertebral body is noted on L5. IMPRESSION: 1. Findings consistent with a partial small bowel obstruction. 2. Aortic atherosclerosis. Aortic Atherosclerosis (ICD10-I70.0). Electronically Signed   By: Thaddeus  Houston M.D.   On: 05/01/2020 15:41   DG Abd 1 View  Result Date: 05/04/2020 CLINICAL DATA:  Nasogastric tube placement EXAM: ABDOMEN - 1 VIEW COMPARISON:  05/02/2020 FINDINGS: Nasogastric tube tip overlies the expected proximal to mid body of the stomach. The visualized abdominal gas pattern is unremarkable. Lung bases are clear.   IMPRESSION: Nasogastric tube tip within the proximal to mid body of the stomach. Electronically Signed   By: Fidela Salisbury MD   On: 05/04/2020 07:02   DG Abdomen 1 View  Result Date: 05/01/2020 CLINICAL DATA:  Check gastric catheter placement EXAM: ABDOMEN - 1 VIEW COMPARISON:  None. FINDINGS: Gastric catheter is noted extending into the stomach. No free air is seen. IMPRESSION: Gastric catheter within the stomach.  Electronically Signed   By: Inez Catalina M.D.   On: 05/01/2020 16:57   DG Abd 2 Views  Result Date: 05/02/2020 CLINICAL DATA:  Check gastric catheter placement EXAM: ABDOMEN - 2 VIEW COMPARISON:  05/01/2020 FINDINGS: Gastric catheter is noted within the stomach. Scattered colonic gas is seen without obstructive change. No free air is noted. Dilated small bowel seen on prior CT examination is not well appreciated on this exam. IMPRESSION: Previously seen partial small bowel obstruction not as well appreciated on this exam. Gastric catheter remains in the stomach. Electronically Signed   By: Inez Catalina M.D.   On: 05/02/2020 07:40   US Abdomen Limited RUQ (LIVER/GB)  Result Date: 05/02/2020 CLINICAL DATA:  Right upper quadrant tenderness over the last 2 days. EXAM: ULTRASOUND ABDOMEN LIMITED RIGHT UPPER QUADRANT COMPARISON:  CT 05/01/2020 FINDINGS: Gallbladder: No gallstones or wall thickening visualized. No sonographic Murphy sign noted by sonographer. Common bile duct: Diameter: 3 mm, normal Liver: No focal lesion identified. Within normal limits in parenchymal echogenicity. Portal vein is patent on color Doppler imaging with normal direction of blood flow towards the liver. Other: None. IMPRESSION: Normal right upper quadrant ultrasound. Electronically Signed   By: Nelson Chimes M.D.   On: 05/02/2020 15:26    Assessment and Plan:   MILYNN QUIRION is a 55 y.o. y/o female here to follow-up for abdominal pain, nausea vomiting, constipation.  Labs in November 2021 demonstrated an isolated elevation in alkaline phosphatase and total calcium.  Her GI symptoms are suggestive of gastroparesis likely secondary to use of oxycodone.  Plan 1.  Repeat CMP and GGT in 6 months 2.  Due to poor prep repeat screening colonoscopy in 4 to 6 weeks, repeat EGD at the same time to ensure healing of esophageal ulcer 3.  Continue PPI at present dose 4.  Gradual withdrawal of narcotics would probably help with underlying  gastroparesis and bowel dysmotility.  Discussed with her to follow-up with her pain clinic doctor and discuss any alternatives 5.  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  CT enterogram to rule out any luminal obstruction which caused her to develop small bowel obstruction in the hospital.  If she is allergic to the contrast can consider an MR enterogram or small bowel follow-through    I have discussed alternative options, risks & benefits,  which include, but are not limited to, bleeding, infection, perforation,respiratory complication & drug reaction.  The patient agrees with this plan & written consent will be obtained.    Dr [name]  MD,MRCP [facility]) Follow up in 8 to 12 weeks

## 2020-05-21 NOTE — Progress Notes (Signed)
Per Dr. Vicente Males, he would like patient to reschedule procedure for 3 weeks out. Patient agreed to be rescheduled. Updated instructions will be sent via my chart.

## 2020-05-21 NOTE — H&P (View-Only) (Signed)
Becky Bellows MD, MRCP(U.K) [address]  [name]  [name], [place] [address]  Main: [phone]  Fax: [phone]   Primary Care Physician: [name], PA-C  Primary Gastroenterologist:  Dr. Jonathon Davies   Follow-up for abdominal pain and constipation  HPI: [name] is a 55 y.o. female   Summary of history :  Initially referred and seen on 03/20/2020 for abdominal pain bloating and constipation. X-ray of the abdomen in November 2021 showed mild to moderate increasing colonic stool burden.  CT scan of the abdomen in December 2021 demonstrated resolution of previously seen small bowel obstruction, moderate constipation.November 2021: TSH normal, hemoglobin 15.8, CMP normal except an elevated calcium and alkaline phosphatase.  At her initial visit she had complained of a sense of bloating and early satiety she had been on oxycodone.  History of traumatic brain injury.   Interval history 03/20/2020-05/21/2020  She was admitted in March 2022 with abdominal pain nausea vomiting and found to have a small bowel obstruction.  EGD showed an esophageal ulcer with no stigmata of bleeding colonoscopy was attempted but inadequate preparation.She was seen by general surgery.   03/20/2020: H. pylori breath test negative.  PTH normal, GGT 24 normal, fractionated alkaline phosphatase normal.  ANA negative.  AMA negative.  CMP showed isolated elevation of alkaline phosphatase at 179.  05/01/2020 CT abdomen and pelvis without contrast demonstrated findings consistent with partial small bowel obstruction 05/02/2020: Right upper quadrant ultrasound normal   Recently denies any nausea vomiting she denies any constipation.  Still on oxycodone.  Taking her Protonix 40 mg twice a day.  Continues to smoke. Current Outpatient Medications  Medication Sig Dispense Refill  . albuterol (VENTOLIN HFA) 108 (90 Base) MCG/ACT inhaler INL 2 PFS PO Q 4 TO 6 H IF NEEDED 18 g 3  .  butalbital-acetaminophen-caffeine (FIORICET) 50-325-40 MG tablet Take 1 tablet by mouth every 6 (six) hours as needed for headache. 30 tablet 0  . carisoprodol (SOMA) 350 MG tablet Take 700 mg by mouth 2 (two) times daily.     Marland Kitchen desvenlafaxine (PRISTIQ) 100 MG 24 hr tablet Take 1 tablet (100 mg total) by mouth daily. 30 tablet 3  . fexofenadine (ALLEGRA) 180 MG tablet Take 180 mg by mouth daily.     Marland Kitchen lidocaine (XYLOCAINE) 2 % solution Use as directed 15 mLs in the mouth or throat every 4 (four) hours as needed for mouth pain. 120 mL 3  . lisinopril-hydrochlorothiazide (ZESTORETIC) 10-12.5 MG tablet TAKE 1 TABLET BY MOUTH EVERY DAY 90 tablet 0  . LORazepam (ATIVAN) 1 MG tablet Take 1 tablet (1 mg total) by mouth every 6 (six) hours as needed for anxiety. 15 tablet 0  . Na Sulfate-K Sulfate-Mg Sulf 17.5-3.13-1.6 GM/177ML SOLN Take 1 kit by mouth once for 1 dose. 354 mL 0  . oxyCODONE (OXY IR/ROXICODONE) 5 MG immediate release tablet Take 5 mg by mouth 2 (two) times daily.    . pantoprazole (PROTONIX) 40 MG tablet Take 1 tablet (40 mg total) by mouth 2 (two) times daily. 180 tablet 0  . sucralfate (CARAFATE) 1 GM/10ML suspension Take 10 mLs (1 g total) by mouth 4 (four) times daily -  with meals and at bedtime. 420 mL 0  . Tiotropium Bromide Monohydrate (SPIRIVA RESPIMAT) 2.5 MCG/ACT AERS Inhale 2 puffs into the lungs daily. 4 g 5  . Docusate Sodium (DSS) 100 MG CAPS TK ONE C PO BID PRF MILD CONSTIPATION (Patient not taking: No sig reported)    .  EMGALITY 120 MG/ML SOAJ Inject 1 mL into the muscle every 28 (twenty-eight) days. (Patient not taking: No sig reported)     No current facility-administered medications for this visit.    Allergies as of 05/21/2020 - Review Complete 05/21/2020  Allergen Reaction Noted  . Contrast media [iodinated diagnostic agents]  01/08/2016  . Diphenhydramine hcl Hives and Itching 04/15/2015  . Adhesive [tape]  01/01/2015  . Iodine Other (See Comments) 05/05/2016  .  Prednisone Anxiety 07/27/2017  . Sulfa antibiotics Rash 07/04/2012    ROS:  General: Negative for anorexia, weight loss, fever, chills, fatigue, weakness. ENT: Negative for hoarseness, difficulty swallowing , nasal congestion. CV: Negative for chest pain, angina, palpitations, dyspnea on exertion, peripheral edema.  Respiratory: Negative for dyspnea at rest, dyspnea on exertion, cough, sputum, wheezing.  GI: See history of present illness. GU:  Negative for dysuria, hematuria, urinary incontinence, urinary frequency, nocturnal urination.  Endo: Negative for unusual weight change.    Physical Examination:   BP 113/80 (BP Location: Left Arm, Patient Position: Sitting, Cuff Size: Normal)   Pulse (!) 106   Ht _0  (1.727 m)   Wt 139 lb 9.6 oz (63.3 kg)   BMI 21.23 kg/m   General: Well-nourished, well-developed in no acute distress.  Eyes: No icterus. Conjunctivae pink. Mouth: Oropharyngeal mucosa moist and pink , no lesions erythema or exudate. Lungs: Clear to auscultation bilaterally. Non-labored. Heart: Regular rate and rhythm, no murmurs rubs or gallops.  Abdomen: Bowel sounds are normal, nontender, nondistended, no hepatosplenomegaly or masses, no abdominal bruits or hernia , no rebound or guarding.   Extremities: No lower extremity edema. No clubbing or deformities. Neuro: Alert and oriented x 3.  Grossly intact. Skin: Warm and dry, no jaundice.   Psych: Alert and cooperative, normal mood and affect.   Imaging Studies: CT ABDOMEN PELVIS WO CONTRAST  Result Date: 05/01/2020 CLINICAL DATA:  Nausea, vomiting and diarrhea x2 days. EXAM: CT ABDOMEN AND PELVIS WITHOUT CONTRAST TECHNIQUE: Multidetector CT imaging of the abdomen and pelvis was performed following the standard protocol without IV contrast. COMPARISON:  January 30, 2019 FINDINGS: Lower chest: No acute abnormality. Hepatobiliary: No focal liver abnormality is seen. No gallstones, gallbladder wall thickening, or biliary  dilatation. Pancreas: Unremarkable. No pancreatic ductal dilatation or surrounding inflammatory changes. Spleen: Normal in size without focal abnormality. Adrenals/Urinary Tract: Adrenal glands are unremarkable. Kidneys are normal, without renal calculi, focal lesion, or hydronephrosis. The urinary bladder is empty and subsequently limited in evaluation. Stomach/Bowel: Stomach is within normal limits. The appendix is surgically absent. Multiple mildly dilated small bowel loops are seen within the abdomen (maximum small bowel diameter of approximately 2.9 cm). A gradual transition zone is seen within the right lower quadrant (axial CT images 46 through 50, CT series number 2) Vascular/Lymphatic: Aortic atherosclerosis. No enlarged abdominal or pelvic lymph nodes. Reproductive: Uterus and bilateral adnexa are unremarkable. Other: No abdominal wall hernia or abnormality. No abdominopelvic ascites or free air. Musculoskeletal: Approximately 2 mm anterolisthesis of the L4 vertebral body is noted on L5. IMPRESSION: 1. Findings consistent with a partial small bowel obstruction. 2. Aortic atherosclerosis. Aortic Atherosclerosis (ICD10-I70.0). Electronically Signed   By: Virgina Norfolk M.D.   On: 05/01/2020 15:41   DG Abd 1 View  Result Date: 05/04/2020 CLINICAL DATA:  Nasogastric tube placement EXAM: ABDOMEN - 1 VIEW COMPARISON:  05/02/2020 FINDINGS: Nasogastric tube tip overlies the expected proximal to mid body of the stomach. The visualized abdominal gas pattern is unremarkable. Lung bases are clear.  IMPRESSION: Nasogastric tube tip within the proximal to mid body of the stomach. Electronically Signed   By: Fidela Salisbury MD   On: 05/04/2020 07:02   DG Abdomen 1 View  Result Date: 05/01/2020 CLINICAL DATA:  Check gastric catheter placement EXAM: ABDOMEN - 1 VIEW COMPARISON:  None. FINDINGS: Gastric catheter is noted extending into the stomach. No free air is seen. IMPRESSION: Gastric catheter within the stomach.  Electronically Signed   By: Inez Catalina M.D.   On: 05/01/2020 16:57   DG Abd 2 Views  Result Date: 05/02/2020 CLINICAL DATA:  Check gastric catheter placement EXAM: ABDOMEN - 2 VIEW COMPARISON:  05/01/2020 FINDINGS: Gastric catheter is noted within the stomach. Scattered colonic gas is seen without obstructive change. No free air is noted. Dilated small bowel seen on prior CT examination is not well appreciated on this exam. IMPRESSION: Previously seen partial small bowel obstruction not as well appreciated on this exam. Gastric catheter remains in the stomach. Electronically Signed   By: Inez Catalina M.D.   On: 05/02/2020 07:40   US Abdomen Limited RUQ (LIVER/GB)  Result Date: 05/02/2020 CLINICAL DATA:  Right upper quadrant tenderness over the last 2 days. EXAM: ULTRASOUND ABDOMEN LIMITED RIGHT UPPER QUADRANT COMPARISON:  CT 05/01/2020 FINDINGS: Gallbladder: No gallstones or wall thickening visualized. No sonographic Murphy sign noted by sonographer. Common bile duct: Diameter: 3 mm, normal Liver: No focal lesion identified. Within normal limits in parenchymal echogenicity. Portal vein is patent on color Doppler imaging with normal direction of blood flow towards the liver. Other: None. IMPRESSION: Normal right upper quadrant ultrasound. Electronically Signed   By: Nelson Chimes M.D.   On: 05/02/2020 15:26    Assessment and Plan:   MILYNN QUIRION is a 55 y.o. y/o female here to follow-up for abdominal pain, nausea vomiting, constipation.  Labs in November 2021 demonstrated an isolated elevation in alkaline phosphatase and total calcium.  Her GI symptoms are suggestive of gastroparesis likely secondary to use of oxycodone.  Plan 1.  Repeat CMP and GGT in 6 months 2.  Due to poor prep repeat screening colonoscopy in 4 to 6 weeks, repeat EGD at the same time to ensure healing of esophageal ulcer 3.  Continue PPI at present dose 4.  Gradual withdrawal of narcotics would probably help with underlying  gastroparesis and bowel dysmotility.  Discussed with her to follow-up with her pain clinic doctor and discuss any alternatives 5.  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  CT enterogram to rule out any luminal obstruction which caused her to develop small bowel obstruction in the hospital.  If she is allergic to the contrast can consider an MR enterogram or small bowel follow-through    I have discussed alternative options, risks & benefits,  which include, but are not limited to, bleeding, infection, perforation,respiratory complication & drug reaction.  The patient agrees with this plan & written consent will be obtained.    Dr Becky Bellows  MD,MRCP [facility]) Follow up in 8 to 12 weeks

## 2020-06-02 ENCOUNTER — Telehealth: Payer: Self-pay | Admitting: Gastroenterology

## 2020-06-02 NOTE — Telephone Encounter (Signed)
Please advise 

## 2020-06-02 NOTE — Telephone Encounter (Signed)
Patient is calling in regards to being claustrophobic for the MRI and need medication called in for that. Please call patient.

## 2020-06-03 MED ORDER — ALPRAZOLAM 0.5 MG PO TABS: 0.5000 mg | ORAL_TABLET | ORAL | 0 refills | Status: AC

## 2020-06-03 NOTE — Telephone Encounter (Signed)
Called to inform patient Dr. Vicente Males has sent in Alprazolam for her to take 60 minutes before the MRI. Explained to her she will need someone to drive her home afterwards. Pt verbalized understanding.

## 2020-06-03 NOTE — Telephone Encounter (Signed)
Pre procedural sedation   Please prescribe alprazolam Oral, 0.5 mg 60  minutes before procedure. Only prescribe 1 tablet. She will need someone to take her home after procedure

## 2020-06-04 ENCOUNTER — Ambulatory Visit
Admission: RE | Admit: 2020-06-04 | Discharge: 2020-06-04 | Disposition: A | Discharge status: Home / Self Care | Payer: 59 | Source: Ambulatory Visit | Attending: Gastroenterology | Admitting: Gastroenterology

## 2020-06-04 ENCOUNTER — Other Ambulatory Visit: Payer: Self-pay

## 2020-06-04 DIAGNOSIS — K3184 Gastroparesis: Secondary | ICD-10-CM | POA: Insufficient documentation

## 2020-06-04 MED ORDER — GADOBUTROL 1 MMOL/ML IV SOLN
6.0000 mL | Freq: Once | INTRAVENOUS | Status: AC | PRN
  Administered 2020-06-04: 6 mL via INTRAVENOUS

## 2020-06-05 ENCOUNTER — Encounter: Payer: Self-pay | Admitting: Gastroenterology

## 2020-06-06 ENCOUNTER — Telehealth: Payer: Self-pay

## 2020-06-06 NOTE — Telephone Encounter (Signed)
Informed patient of Dr. Georgeann Oppenheim comments/recommendations. Explained to patient Dr. Vicente Males had prescribed Xanax for her to take before MRI but patient states she never received. Patient states she is already following the high fiber diet. Pt has been scheduled for an office visit. Pt verbalized understanding.

## 2020-06-06 NOTE — Telephone Encounter (Signed)
-----   Message from [name], MD sent at 06/05/2020 11:22 AM EDT ----- [name] Inform  MRI did not show any obstruction in the small intestine.  Did note significant quantity of stool throughout the colon suggestive of constipation.  This could have led to the bowel obstruction which she had previously.  I suggest  her to take MiraLAX 1 capful daily every single day to ensure she has 1-2 soft bowel movements per day.  High-fiber diet with the plan to obtain a case 25 g of fiber per day.  Please send her patient information for high-fiber diet.  Please schedule follow-up visit to discuss if further medication is required to treat her constipation aggressively to prevent recurrence of bowel obstruction which she had.  C/c [name], PA-C   Dr [name] MD,MRCP [facility]) Gastroenterology/Hepatology Pager: [phone]

## 2020-06-09 ENCOUNTER — Other Ambulatory Visit: Payer: Self-pay

## 2020-06-09 ENCOUNTER — Encounter: Payer: Self-pay | Admitting: Family Medicine

## 2020-06-09 ENCOUNTER — Ambulatory Visit (INDEPENDENT_AMBULATORY_CARE_PROVIDER_SITE_OTHER): Payer: 59 | Admitting: Family Medicine

## 2020-06-09 VITALS — BP 108/64 | HR 99 | Temp 98.1°F | Resp 18 | Ht 68.0 in | Wt 137.8 lb

## 2020-06-09 DIAGNOSIS — Z0000 Encounter for general adult medical examination without abnormal findings: Secondary | ICD-10-CM

## 2020-06-09 DIAGNOSIS — Z09 Encounter for follow-up examination after completed treatment for conditions other than malignant neoplasm: Secondary | ICD-10-CM

## 2020-06-09 DIAGNOSIS — I10 Essential (primary) hypertension: Secondary | ICD-10-CM | POA: Diagnosis not present

## 2020-06-09 DIAGNOSIS — F17200 Nicotine dependence, unspecified, uncomplicated: Secondary | ICD-10-CM

## 2020-06-09 DIAGNOSIS — F39 Unspecified mood [affective] disorder: Secondary | ICD-10-CM

## 2020-06-09 DIAGNOSIS — F0630 Mood disorder due to known physiological condition, unspecified: Secondary | ICD-10-CM

## 2020-06-09 DIAGNOSIS — F341 Dysthymic disorder: Secondary | ICD-10-CM

## 2020-06-09 DIAGNOSIS — F482 Pseudobulbar affect: Secondary | ICD-10-CM

## 2020-06-09 DIAGNOSIS — F4310 Post-traumatic stress disorder, unspecified: Secondary | ICD-10-CM

## 2020-06-09 DIAGNOSIS — Z1231 Encounter for screening mammogram for malignant neoplasm of breast: Secondary | ICD-10-CM

## 2020-06-09 DIAGNOSIS — Z124 Encounter for screening for malignant neoplasm of cervix: Secondary | ICD-10-CM

## 2020-06-09 DIAGNOSIS — R1010 Upper abdominal pain, unspecified: Secondary | ICD-10-CM

## 2020-06-09 DIAGNOSIS — G40909 Epilepsy, unspecified, not intractable, without status epilepticus: Secondary | ICD-10-CM

## 2020-06-09 DIAGNOSIS — J449 Chronic obstructive pulmonary disease, unspecified: Secondary | ICD-10-CM | POA: Diagnosis not present

## 2020-06-09 DIAGNOSIS — S069XAS Unspecified intracranial injury with loss of consciousness status unknown, sequela: Secondary | ICD-10-CM

## 2020-06-09 DIAGNOSIS — S069X9S Unspecified intracranial injury with loss of consciousness of unspecified duration, sequela: Secondary | ICD-10-CM

## 2020-06-09 MED ORDER — SPIRIVA RESPIMAT 2.5 MCG/ACT IN AERS: 2.0000 | INHALATION_SPRAY | Freq: Every day | RESPIRATORY_TRACT | 5 refills | Status: DC

## 2020-06-09 MED ORDER — LISINOPRIL-HYDROCHLOROTHIAZIDE 10-12.5 MG PO TABS
1.0000 | ORAL_TABLET | Freq: Every day | ORAL | 0 refills | Status: DC
Start: 2020-06-09 — End: 2020-08-18

## 2020-06-09 NOTE — Progress Notes (Signed)
Patient ID: [name], female    DOB: 1965/08/03, 55 y.o.   MRN: [id number]  PCP: [name], PA-C  Chief Complaint  Patient presents with  . Hospitalization Follow-up    Past 14 days seeing GI, twisted bowels  . Anxiety  . Depression    Subjective:   [name] is a 55 y.o. female, presents to clinic with CC of the following:  HPI   HFU -  Pt was admitted 05/01/2020-05/10/2020 with abd pain, partial bowel obstruction, gastroparesis TOC f/up call was done but no PCP appt was able to be scheduled She did f/up with GI outpt  PT here for refill on psych meds started when she was inpatient for bowel obstruction, the inpt experience triggered her PTSD- previously referred to psych  Sister able to come down every other weekend PTSD and panic attacks are much worse pristiq seems to be helping a little bit, she was on previously, but stopped and switched to abilify with psych/neuro She was referred to local psychiatry after est here for primary care, she has not seen psych outpt in a while, Dr. Toy Care moved practices She was given ativan #15 March 12th 1 mg QID prn, she said she took about one daily, sx are better if she just stays in her home away from everyone.  That is isolating and makes her more depressed however. Depression screen [facility] 2/9 06/09/2020 01/17/2020 01/17/2020  Decreased Interest 2 3 3   Down, Depressed, Hopeless 3 2 2   PHQ - 2 Score 5 5 5   Altered sleeping 3 3 -  Tired, decreased energy 3 3 -  Change in appetite 2 3 -  Feeling bad or failure about yourself  3 2 -  Trouble concentrating 2 3 -  Moving slowly or fidgety/restless 3 2 -  Suicidal thoughts 0 0 -  PHQ-9 Score 21 21 -  Difficult doing work/chores Very difficult Very difficult -  reviewed phq 9 and GAD 7 today GAD 7 : Generalized Anxiety Score 06/09/2020 01/17/2020 03/14/2019  Nervous, Anxious, on Edge 3 3 3   Control/stop worrying 2 2 1   Worry too much - different things 2 2 3   Trouble relaxing 3 3 3    Restless 3 2 3   Easily annoyed or irritable 3 3 3   Afraid - awful might happen 3 2 0  Total GAD 7 Score 19 17 16   Anxiety Difficulty Very difficult Somewhat difficult Very difficult  controlled substance database reviewed ativan from [facility]  Out of meds Also on pain meds  Hypertension:  Currently managed on lisinopril-HCTZ Pt reports good med compliance and denies any SE.   Blood pressure today is well controlled. BP Readings from Last 3 Encounters:  06/09/20 108/64  05/21/20 113/80  05/10/20 (!) 137/92   Pt denies CP, SOB, exertional sx, LE edema, palpitation, Ha's, visual disturbances, lightheadedness, hypotension, syncope.  She is seeing GI for gastroparesis, constipation, f/up on bowel obstruction, she did EGD and they found ulcers she is on carafate and protonix, she has GI f/up in the next 2 weeks.  She is still concerned with bulge in abd wall mid right side, it gets sore and burns She is on miralax and has watery stool, going most days.  Still smoking, using daily inhaler, no change in pulm sx, she denies needing refill on inhaler right now.  Chronic HA, s/p TBI, managed by neurology - still on injectable, needing fioricet once a month   Patient Active Problem List  Diagnosis Date Noted  . Dysthymia 05/08/2020  . SOB (shortness of breath) 05/01/2020  . SBO (small bowel obstruction) ([place]) 05/01/2020  . Leukocytosis 05/01/2020  . Colon cancer screening 03/20/2020  . Gastric outlet obstruction 03/20/2020  . Chronic obstructive pulmonary disease ([place]) 06/14/2019  . Tobacco use 06/14/2019  . Traumatic brain injury with loss of consciousness ([place]) 05/08/2019  . 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 S/P laparoscopic appendectomy 02/15/2019  . Nonintractable headache   . Hypertension   . Chronic neck pain   . Vertigo 07/11/2017  . Abdominal pain 11/15/2016  . FH: colon polyps  11/15/2016  . Elevated alkaline phosphatase level 11/15/2016  . BRBPR (bright red blood per rectum) 11/15/2016  . Other dysphagia 11/15/2016  . Other constipation 11/15/2016  . PTSD (post-traumatic stress disorder) 05/18/2016  . Breast mass, left 01/08/2016  . Anxiety and depression 01/08/2016  . Chronic pain 01/08/2016  . Seizure disorder ([place]) 01/08/2016  . GERD (gastroesophageal reflux disease) 01/08/2016  . Difficulty sleeping 08/05/2015  . Mood disorder ([place]) 05/05/2015  . Tobacco abuse counseling 04/15/2015  . Crushing injury of neck 09/27/2011  . Head injury 05/06/2011      Current Outpatient Medications:  .  albuterol (VENTOLIN HFA) 108 (90 Base) MCG/ACT inhaler, INL 2 PFS PO Q 4 TO 6 H IF NEEDED, Disp: 18 g, Rfl: 3 .  butalbital-acetaminophen-caffeine (FIORICET) 50-325-40 MG tablet, Take 1 tablet by mouth every 6 (six) hours as needed for headache., Disp: 30 tablet, Rfl: 0 .  carisoprodol (SOMA) 350 MG tablet, Take 700 mg by mouth 2 (two) times daily. , Disp: , Rfl:  .  desvenlafaxine (PRISTIQ) 100 MG 24 hr tablet, Take 1 tablet (100 mg total) by mouth daily., Disp: 30 tablet, Rfl: 3 .  Docusate Sodium (DSS) 100 MG CAPS, TK ONE C PO BID PRF MILD CONSTIPATION, Disp: , Rfl:  .  EMGALITY 120 MG/ML SOAJ, Inject 1 mL into the muscle every 28 (twenty-eight) days., Disp: , Rfl:  .  fexofenadine (ALLEGRA) 180 MG tablet, Take 180 mg by mouth daily. , Disp: , Rfl:  .  lidocaine (XYLOCAINE) 2 % solution, Use as directed 15 mLs in the mouth or throat every 4 (four) hours as needed for mouth pain., Disp: 120 mL, Rfl: 3 .  lisinopril-hydrochlorothiazide (ZESTORETIC) 10-12.5 MG tablet, TAKE 1 TABLET BY MOUTH EVERY DAY, Disp: 90 tablet, Rfl: 0 .  LORazepam (ATIVAN) 1 MG tablet, Take 1 tablet (1 mg total) by mouth every 6 (six) hours as needed for anxiety., Disp: 15 tablet, Rfl: 0 .  oxyCODONE (OXY IR/ROXICODONE) 5 MG immediate release tablet, Take 5 mg by mouth 2 (two) times daily., Disp: ,  Rfl:  .  pantoprazole (PROTONIX) 40 MG tablet, Take 1 tablet (40 mg total) by mouth 2 (two) times daily., Disp: 180 tablet, Rfl: 0 .  sucralfate (CARAFATE) 1 GM/10ML suspension, Take 10 mLs (1 g total) by mouth 4 (four) times daily -  with meals and at bedtime., Disp: 420 mL, Rfl: 0 .  Tiotropium Bromide Monohydrate (SPIRIVA RESPIMAT) 2.5 MCG/ACT AERS, Inhale 2 puffs into the lungs daily., Disp: 4 g, Rfl: 5   Allergies  Allergen Reactions  . Contrast Media [Iodinated Diagnostic Agents]   . Diphenhydramine Hcl Hives and Itching  . Adhesive [Tape]     PAPER TAPE OK  . Iodine Other (See Comments)  . Prednisone Anxiety  . Sulfa Antibiotics Rash  Social History   Tobacco Use  . Smoking status: Current Every Day Smoker    Packs/day: 1.00    Types: Cigarettes  . Smokeless tobacco: Never Used  Vaping Use  . Vaping Use: Every day  . Substances: CBD  Substance Use Topics  . Alcohol use: No  . Drug use: Yes    Types: Marijuana      Chart Review Today: I personally reviewed active problem list, medication list, allergies, family history, social history, health maintenance, notes from last encounter, lab results, imaging with the patient/caregiver today.   Review of Systems  Constitutional: Negative.   HENT: Negative.   Eyes: Negative.   Respiratory: Negative.   Cardiovascular: Negative.   Gastrointestinal: Negative.   Endocrine: Negative.   Genitourinary: Negative.   Musculoskeletal: Negative.   Skin: Negative.   Allergic/Immunologic: Negative.   Neurological: Negative.   Hematological: Negative.   Psychiatric/Behavioral: Negative.   All other systems reviewed and are negative.      Objective:   Vitals:   06/09/20 0921  Pulse: 99  Resp: 18  Temp: 98.1 F (36.7 C)  SpO2: 99%  Weight: 137 lb 12.8 oz (62.5 kg)  Height: 5\' 8"  (1.727 m)    Body mass index is 20.95 kg/m.  Physical Exam Vitals and nursing note reviewed.  Constitutional:      General: She is  not in acute distress.    Appearance: Normal appearance. She is not ill-appearing, toxic-appearing or diaphoretic.  HENT:     Head: Normocephalic and atraumatic.     Right Ear: External ear normal.     Left Ear: External ear normal.  Eyes:     Conjunctiva/sclera: Conjunctivae normal.  Pulmonary:     Effort: Pulmonary effort is normal.     Breath sounds: Normal breath sounds.  Skin:    General: Skin is warm and dry.     Coloration: Skin is not jaundiced or pale.     Findings: No lesion or rash.  Neurological:     Mental Status: She is alert.  Psychiatric:        Attention and Perception: Attention normal.        Mood and Affect: Mood and affect normal.        Speech: Speech normal.        Behavior: Behavior is cooperative.        Thought Content: Thought content does not include homicidal or suicidal ideation. Thought content does not include homicidal plan.      Results for orders placed or performed during the hospital encounter of 05/01/20  Resp Panel by RT-PCR (Flu A&B, Covid) Nasopharyngeal Swab   Specimen: Nasopharyngeal Swab; Nasopharyngeal(NP) swabs in vial transport medium  Result Value Ref Range   SARS Coronavirus 2 by RT PCR NEGATIVE NEGATIVE   Influenza A by PCR NEGATIVE NEGATIVE   Influenza B by PCR NEGATIVE NEGATIVE  KOH prep   Specimen: Bronchial Brush  Result Value Ref Range   Specimen Description BRONCHIAL BRUSHING    Special Requests NONE    KOH Prep      NO YEAST OR FUNGAL ELEMENTS SEEN Performed at [facility], [address] [place], [place] [address]    Report Status 05/07/2020 FINAL   Lipase, blood  Result Value Ref Range   Lipase 28 11 - 51 U/L  Comprehensive metabolic panel  Result Value Ref Range   Sodium 134 (L) 135 - 145 mmol/L   Potassium 3.8 3.5 - 5.1 mmol/L   Chloride  96 (L) 98 - 111 mmol/L   CO2 24 22 - 32 mmol/L   Glucose, Bld 119 (H) 70 - 99 mg/dL   BUN 19 6 - 20 mg/dL   Creatinine, Ser 0.81 0.44 - 1.00 mg/dL    Calcium 9.7 8.9 - 10.3 mg/dL   Total Protein 8.3 (H) 6.5 - 8.1 g/dL   Albumin 4.6 3.5 - 5.0 g/dL   AST 19 15 - 41 U/L   ALT 21 0 - 44 U/L   Alkaline Phosphatase 172 (H) 38 - 126 U/L   Total Bilirubin 0.9 0.3 - 1.2 mg/dL   GFR, Estimated >60 >60 mL/min   Anion gap 14 5 - 15  CBC  Result Value Ref Range   WBC 14.6 (H) 4.0 - 10.5 K/uL   RBC 5.67 (H) 3.87 - 5.11 MIL/uL   Hemoglobin 16.8 (H) 12.0 - 15.0 g/dL   HCT 48.9 (H) 36.0 - 46.0 %   MCV 86.2 80.0 - 100.0 fL   MCH 29.6 26.0 - 34.0 pg   MCHC 34.4 30.0 - 36.0 g/dL   RDW 13.4 11.5 - 15.5 %   Platelets 415 (H) 150 - 400 K/uL   nRBC 0.0 0.0 - 0.2 %  Magnesium  Result Value Ref Range   Magnesium 2.4 1.7 - 2.4 mg/dL  Comprehensive metabolic panel  Result Value Ref Range   Sodium 137 135 - 145 mmol/L   Potassium 3.5 3.5 - 5.1 mmol/L   Chloride 99 98 - 111 mmol/L   CO2 28 22 - 32 mmol/L   Glucose, Bld 104 (H) 70 - 99 mg/dL   BUN 20 6 - 20 mg/dL   Creatinine, Ser 0.72 0.44 - 1.00 mg/dL   Calcium 8.8 (L) 8.9 - 10.3 mg/dL   Total Protein 6.4 (L) 6.5 - 8.1 g/dL   Albumin 3.5 3.5 - 5.0 g/dL   AST 16 15 - 41 U/L   ALT 17 0 - 44 U/L   Alkaline Phosphatase 131 (H) 38 - 126 U/L   Total Bilirubin 0.8 0.3 - 1.2 mg/dL   GFR, Estimated >60 >60 mL/min   Anion gap 10 5 - 15  Magnesium  Result Value Ref Range   Magnesium 2.0 1.7 - 2.4 mg/dL  CBC with Differential/Platelet  Result Value Ref Range   WBC 10.1 4.0 - 10.5 K/uL   RBC 4.84 3.87 - 5.11 MIL/uL   Hemoglobin 14.6 12.0 - 15.0 g/dL   HCT 41.9 36.0 - 46.0 %   MCV 86.6 80.0 - 100.0 fL   MCH 30.2 26.0 - 34.0 pg   MCHC 34.8 30.0 - 36.0 g/dL   RDW 13.4 11.5 - 15.5 %   Platelets 327 150 - 400 K/uL   nRBC 0.0 0.0 - 0.2 %   Neutrophils Relative % 59 %   Neutro Abs 6.0 1.7 - 7.7 K/uL   Lymphocytes Relative 32 %   Lymphs Abs 3.2 0.7 - 4.0 K/uL   Monocytes Relative 8 %   Monocytes Absolute 0.8 0.1 - 1.0 K/uL   Eosinophils Relative 1 %   Eosinophils Absolute 0.1 0.0 - 0.5 K/uL    Basophils Relative 0 %   Basophils Absolute 0.0 0.0 - 0.1 K/uL   Immature Granulocytes 0 %   Abs Immature Granulocytes 0.04 0.00 - 0.07 K/uL  Urinalysis, Routine w reflex microscopic  Result Value Ref Range   Color, Urine AMBER (A) YELLOW   APPearance HAZY (A) CLEAR   Specific Gravity, Urine 1.027 1.005 - 1.030  pH 5.0 5.0 - 8.0   Glucose, UA NEGATIVE NEGATIVE mg/dL   Hgb urine dipstick SMALL (A) NEGATIVE   Bilirubin Urine NEGATIVE NEGATIVE   Ketones, ur NEGATIVE NEGATIVE mg/dL   Protein, ur 30 (A) NEGATIVE mg/dL   Nitrite NEGATIVE NEGATIVE   Leukocytes,Ua NEGATIVE NEGATIVE   RBC / HPF 0-5 0 - 5 RBC/hpf   WBC, UA 0-5 0 - 5 WBC/hpf   Bacteria, UA NONE SEEN NONE SEEN   Squamous Epithelial / LPF 0-5 0 - 5   Mucus PRESENT   Comprehensive metabolic panel  Result Value Ref Range   Sodium 137 135 - 145 mmol/L   Potassium 3.9 3.5 - 5.1 mmol/L   Chloride 96 (L) 98 - 111 mmol/L   CO2 28 22 - 32 mmol/L   Glucose, Bld 89 70 - 99 mg/dL   BUN 11 6 - 20 mg/dL   Creatinine, Ser 0.75 0.44 - 1.00 mg/dL   Calcium 8.9 8.9 - 10.3 mg/dL   Total Protein 6.5 6.5 - 8.1 g/dL   Albumin 3.3 (L) 3.5 - 5.0 g/dL   AST 13 (L) 15 - 41 U/L   ALT 12 0 - 44 U/L   Alkaline Phosphatase 110 38 - 126 U/L   Total Bilirubin 1.1 0.3 - 1.2 mg/dL   GFR, Estimated >60 >60 mL/min   Anion gap 13 5 - 15  CBC with Differential/Platelet  Result Value Ref Range   WBC 12.1 (H) 4.0 - 10.5 K/uL   RBC 4.45 3.87 - 5.11 MIL/uL   Hemoglobin 13.2 12.0 - 15.0 g/dL   HCT 39.3 36.0 - 46.0 %   MCV 88.3 80.0 - 100.0 fL   MCH 29.7 26.0 - 34.0 pg   MCHC 33.6 30.0 - 36.0 g/dL   RDW 12.8 11.5 - 15.5 %   Platelets 297 150 - 400 K/uL   nRBC 0.0 0.0 - 0.2 %   Neutrophils Relative % 78 %   Neutro Abs 9.4 (H) 1.7 - 7.7 K/uL   Lymphocytes Relative 14 %   Lymphs Abs 1.7 0.7 - 4.0 K/uL   Monocytes Relative 8 %   Monocytes Absolute 0.9 0.1 - 1.0 K/uL   Eosinophils Relative 0 %   Eosinophils Absolute 0.0 0.0 - 0.5 K/uL   Basophils  Relative 0 %   Basophils Absolute 0.0 0.0 - 0.1 K/uL   Immature Granulocytes 0 %   Abs Immature Granulocytes 0.05 0.00 - 0.07 K/uL  Magnesium  Result Value Ref Range   Magnesium 1.8 1.7 - 2.4 mg/dL  Phosphorus  Result Value Ref Range   Phosphorus 3.0 2.5 - 4.6 mg/dL  CBC with Differential/Platelet  Result Value Ref Range   WBC 10.3 4.0 - 10.5 K/uL   RBC 4.31 3.87 - 5.11 MIL/uL   Hemoglobin 13.0 12.0 - 15.0 g/dL   HCT 36.9 36.0 - 46.0 %   MCV 85.6 80.0 - 100.0 fL   MCH 30.2 26.0 - 34.0 pg   MCHC 35.2 30.0 - 36.0 g/dL   RDW 12.6 11.5 - 15.5 %   Platelets 299 150 - 400 K/uL   nRBC 0.0 0.0 - 0.2 %   Neutrophils Relative % 67 %   Neutro Abs 6.9 1.7 - 7.7 K/uL   Lymphocytes Relative 24 %   Lymphs Abs 2.5 0.7 - 4.0 K/uL   Monocytes Relative 8 %   Monocytes Absolute 0.9 0.1 - 1.0 K/uL   Eosinophils Relative 1 %   Eosinophils Absolute 0.1 0.0 - 0.5  K/uL   Basophils Relative 0 %   Basophils Absolute 0.0 0.0 - 0.1 K/uL   Immature Granulocytes 0 %   Abs Immature Granulocytes 0.03 0.00 - 0.07 K/uL  Basic metabolic panel  Result Value Ref Range   Sodium 137 135 - 145 mmol/L   Potassium 3.6 3.5 - 5.1 mmol/L   Chloride 97 (L) 98 - 111 mmol/L   CO2 29 22 - 32 mmol/L   Glucose, Bld 92 70 - 99 mg/dL   BUN 9 6 - 20 mg/dL   Creatinine, Ser 0.64 0.44 - 1.00 mg/dL   Calcium 8.7 (L) 8.9 - 10.3 mg/dL   GFR, Estimated >60 >60 mL/min   Anion gap 11 5 - 15  Magnesium  Result Value Ref Range   Magnesium 2.2 1.7 - 2.4 mg/dL  Surgical pathology  Result Value Ref Range   SURGICAL PATHOLOGY      SURGICAL PATHOLOGY CASE: [id number] PATIENT: Jamilyn Neels Surgical Pathology Report     Specimen Submitted: A. Duodenum; cbx B. Stomach, body; cbx C. Stomach, antrum; cbx  Clinical History: Epigastric pain, partial small bowel obstruction. Esophageal erythema, esophagitis, normal colon    DIAGNOSIS: A. DUODENUM; COLD BIOPSY: - ENTERIC MUCOSA WITH PRESERVED VILLOUS ARCHITECTURE AND NO  SIGNIFICANT HISTOPATHOLOGIC CHANGE. - NEGATIVE FOR FEATURES OF CELIAC, DYSPLASIA, AND MALIGNANCY.  B. STOMACH, BODY; COLD BIOPSY: - GASTRIC ANTRAL AND OXYNTIC MUCOSA WITH MILD SUPERFICIAL HYPEREMIA AND VASCULAR CONGESTION; OTHERWISE NO SIGNIFICANT HISTOPATHOLOGIC CHANGE. - NEGATIVE FOR H. PYLORI, DYSPLASIA, AND MALIGNANCY.  C. STOMACH, ANTRUM; COLD BIOPSY: - GASTRIC ANTRAL MUCOSA WITH NO SIGNIFICANT HISTOPATHOLOGIC CHANGE. - NEGATIVE FOR H. PYLORI, DYSPLASIA, AND MALIGNANCY.  GROSS DESCRIPTION: A. Labeled: Duodenum cbxs, abdominal pain Received: Formalin Collection time:  10:49 AM on 05/07/2020 Placed into formalin time: 9:49 AM on 05/07/2020 Tissue fragment(s): Multiple Size: Aggregate, 1 x 0.5 x 0.2 cm Description: Tan-pink soft tissue fragments Entirely submitted in 1 cassette.  B. Labeled: Gastric body cbxs, erythema Received: Formalin Collection time: 10:51 AM on 05/07/2020 Placed into formalin time: 10:51 AM on 05/07/2020 Tissue fragment(s): Multiple Size: Aggregate, 1 x 0.5 x 0.2 cm Description: Tan-pink soft tissue fragments Entirely submitted in 1 cassette.  C. Labeled: Antrum of stomach cbxs, abdominal pain Received: Formalin Collection time: 10:54 AM on 05/07/2020 Placed into formalin time: 10:54 AM on 05/07/2020 Tissue fragment(s): 2 Size: Range from 0.3-0.5 cm Description: Tan-pink soft tissue fragments Entirely submitted in 1 cassette.  RB 05/07/2020   Final Diagnosis performed by Allena Napoleon, MD.   Electronically signed 05/08/2020 9:11:43AM The electronic signature indicates that the named Attending Pathologist has evalua ted the specimen Technical component performed at [facility], [address] [place], [place] [address] Lab: [phone] Dir: Rush Farmer, MD, MMM  Professional component performed at [facility], [facility], [place], [place], [place] [address] Lab: [phone] Dir: Dellia Nims. Reuel Derby, MD        Assessment & Plan:      ICD-10-CM   1. Chronic obstructive pulmonary disease, unspecified COPD type ([place])  J44.9 Tiotropium Bromide Monohydrate (SPIRIVA RESPIMAT) 2.5 MCG/ACT AERS   sx well controlled currently, no recent exacerbations, meds refilled  2. HTN (hypertension), malignant  I10 lisinopril-hydrochlorothiazide (ZESTORETIC) 10-12.5 MG tablet   stable, well controlled, BP at goal today on lisinopril-HCTZ  3. Encounter for screening mammogram for malignant neoplasm of breast  Z12.31 MM DIAG BREAST TOMO BILATERAL  4. 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 Pseudobulbar affect  F48.2    per psych, was seeing Dr. Toy Care- will need to see if pt can continue with her though she moved offices  6. Pain of upper abdomen  R10.10    chronic, small bulge, not noted in radiology, no worsening, has seen GI and general surgery  7. PTSD (post-traumatic stress disorder)  F43.10    needs to continue with psychiatry for management of ptsd, meds, CBT and controlled substances if needed for PTSD  8. Dysthymia  F34.1    depressive sx worse, denies SI  9. Encounter for examination following treatment at hospital  Z09    reviewed admission H&P, discharge summary, consults from psychiatry, TOC f/up, labs, imaging results - more than 15 min spent on review today  10. Current smoker  F17.200   11. Mood disorder (HCC) Chronic F39    per psychiatry - see above (#4)  12. Seizure disorder [facility]) Chronic G40.909    manage by neurology, no recent seizures   I explained to the pt that her psychiatric meds is not something primary care can manage due to her Dx, meds, controlled substances and complex hx - she was established with psychiatry and need to continue to manage with them - will have to see if Dr. Toy Care can keep seeing pt, or if she will need to be  referred elsewhere. I also explained that if she was given ativan inpatient it does not mean that it will be continues outpt by PCP, and specifically that our clinic has a policy against chronic benzos - this would be even more complicated with her narcotic pain med management. I explained she will have to go back to psych to discuss or manage this and likely with worsening PTSD and panic attacks since hospital admission she will need therapy with meds to help better manage - with needs to be done with psychiatry.  I told the pt I would look into who would be able to see her and also check with SP about ativan, but that I will be unlikely to refill this med - no risk of withdrawal, only 4 d rx was given to pt about one month ago.    Continue pristiq for now (multiple refills from inpt hospitalists after consult with psych) and f/up with psychiatry     [name], PA-C 06/09/20 9:40 AM

## 2020-06-12 NOTE — Progress Notes (Signed)
Can you try and contact the [place] office per Dr. Toy Care and see if Dr. Modesta Messing will take over care for pt since Dr. Toy Care left - I'm not sure which clinics those are at and if she needs another referral for that, or if we just need to help pt touch base with them?  Will you please assist with this so pt can get seen again by psychiatry Thanks  Kristeen Miss

## 2020-06-13 ENCOUNTER — Ambulatory Visit
Admission: RE | Admit: 2020-06-13 | Discharge: 2020-06-13 | Disposition: A | Discharge status: Home / Self Care | Payer: 59 | Attending: Gastroenterology | Admitting: Gastroenterology

## 2020-06-13 ENCOUNTER — Encounter
Admission: RE | Disposition: A | Discharge status: Home / Self Care | Payer: Self-pay | Source: Home / Self Care | Attending: Gastroenterology

## 2020-06-13 ENCOUNTER — Other Ambulatory Visit: Payer: Self-pay

## 2020-06-13 ENCOUNTER — Ambulatory Visit: Payer: 59 | Admitting: Certified Registered Nurse Anesthetist

## 2020-06-13 DIAGNOSIS — Z1211 Encounter for screening for malignant neoplasm of colon: Secondary | ICD-10-CM | POA: Diagnosis present

## 2020-06-13 DIAGNOSIS — K640 First degree hemorrhoids: Secondary | ICD-10-CM | POA: Insufficient documentation

## 2020-06-13 DIAGNOSIS — Z5941 Food insecurity: Secondary | ICD-10-CM | POA: Diagnosis not present

## 2020-06-13 DIAGNOSIS — Z79899 Other long term (current) drug therapy: Secondary | ICD-10-CM | POA: Insufficient documentation

## 2020-06-13 DIAGNOSIS — R1110 Vomiting, unspecified: Secondary | ICD-10-CM

## 2020-06-13 DIAGNOSIS — Z9049 Acquired absence of other specified parts of digestive tract: Secondary | ICD-10-CM | POA: Diagnosis not present

## 2020-06-13 DIAGNOSIS — Z87820 Personal history of traumatic brain injury: Secondary | ICD-10-CM | POA: Insufficient documentation

## 2020-06-13 DIAGNOSIS — D123 Benign neoplasm of transverse colon: Secondary | ICD-10-CM | POA: Insufficient documentation

## 2020-06-13 DIAGNOSIS — Z882 Allergy status to sulfonamides status: Secondary | ICD-10-CM | POA: Insufficient documentation

## 2020-06-13 DIAGNOSIS — Z79891 Long term (current) use of opiate analgesic: Secondary | ICD-10-CM | POA: Insufficient documentation

## 2020-06-13 DIAGNOSIS — Z91041 Radiographic dye allergy status: Secondary | ICD-10-CM | POA: Insufficient documentation

## 2020-06-13 DIAGNOSIS — K635 Polyp of colon: Secondary | ICD-10-CM | POA: Diagnosis not present

## 2020-06-13 DIAGNOSIS — R112 Nausea with vomiting, unspecified: Secondary | ICD-10-CM | POA: Diagnosis present

## 2020-06-13 DIAGNOSIS — K311 Adult hypertrophic pyloric stenosis: Secondary | ICD-10-CM

## 2020-06-13 DIAGNOSIS — F17210 Nicotine dependence, cigarettes, uncomplicated: Secondary | ICD-10-CM | POA: Insufficient documentation

## 2020-06-13 DIAGNOSIS — Z888 Allergy status to other drugs, medicaments and biological substances status: Secondary | ICD-10-CM | POA: Diagnosis not present

## 2020-06-13 DIAGNOSIS — K449 Diaphragmatic hernia without obstruction or gangrene: Secondary | ICD-10-CM | POA: Diagnosis not present

## 2020-06-13 DIAGNOSIS — Z596 Low income: Secondary | ICD-10-CM | POA: Insufficient documentation

## 2020-06-13 HISTORY — PX: COLONOSCOPY WITH PROPOFOL: SHX5780

## 2020-06-13 HISTORY — PX: ESOPHAGOGASTRODUODENOSCOPY (EGD) WITH PROPOFOL: SHX5813

## 2020-06-13 SURGERY — COLONOSCOPY WITH PROPOFOL
Anesthesia: General

## 2020-06-13 MED ORDER — SODIUM CHLORIDE 0.9 % IV SOLN
INTRAVENOUS | Status: DC
  Administered 2020-06-13: 20 mL/h via INTRAVENOUS

## 2020-06-13 MED ORDER — PROPOFOL 10 MG/ML IV BOLUS
INTRAVENOUS | Status: DC | PRN
  Administered 2020-06-13: 20 mg via INTRAVENOUS
  Administered 2020-06-13: 50 mg via INTRAVENOUS
  Administered 2020-06-13: 20 mg via INTRAVENOUS

## 2020-06-13 MED ORDER — PROPOFOL 500 MG/50ML IV EMUL
INTRAVENOUS | Status: DC | PRN
  Administered 2020-06-13: 100 ug/kg/min via INTRAVENOUS

## 2020-06-13 MED ORDER — PROPOFOL 500 MG/50ML IV EMUL
INTRAVENOUS | Status: AC
  Filled 2020-06-13: qty 100

## 2020-06-13 MED ORDER — LIDOCAINE HCL (CARDIAC) PF 100 MG/5ML IV SOSY
PREFILLED_SYRINGE | INTRAVENOUS | Status: DC | PRN
  Administered 2020-06-13: 50 mg via INTRAVENOUS

## 2020-06-13 NOTE — Op Note (Signed)
[facility] Patient Name: [name] Procedure Date: 06/13/2020 8:32 AM MRN: [id number] Account #: [id number] Date of Birth: 1965/08/10 Admit Type: Outpatient Age: 55 Room: [facility] ENDO ROOM 4 Gender: Female Note Status: Finalized Procedure:             Colonoscopy Indications:           Screening for colorectal malignant neoplasm Providers:             [name] MD, MD Medicines:             Propofol per Anesthesia Complications:         No immediate complications. Procedure:             Pre-Anesthesia Assessment:                        - Prior to the procedure, a History and Physical was                         performed, and patient medications and allergies were                         reviewed. The patient's tolerance of previous                         anesthesia was also reviewed. The risks and benefits                         of the procedure and the sedation options and risks                         were discussed with the patient. All questions were                         answered, and informed consent was obtained. Prior                         Anticoagulants: The patient has taken no previous                         anticoagulant or antiplatelet agents. ASA Grade                         Assessment: II - A patient with mild systemic disease.                         After reviewing the risks and benefits, the patient                         was deemed in satisfactory condition to undergo the                         procedure.                        After obtaining informed consent, the colonoscope was                         passed under direct vision. Throughout the procedure,  the patient's blood pressure, pulse, and oxygen                         saturations were monitored continuously. The                         Colonoscope was introduced through the anus and                         advanced to the the cecum,  identified by appendiceal                         orifice and ileocecal valve. The colonoscopy was                         performed without difficulty. The patient tolerated                         the procedure well. The quality of the bowel                         preparation was good. Findings:      The perianal and digital rectal examinations were normal.      A 2 mm polyp was found in the transverse colon. The polyp was sessile.       The polyp was removed with a cold biopsy forceps. Resection and       retrieval were complete.      Non-bleeding internal hemorrhoids were found during retroflexion. The       hemorrhoids were Grade I (internal hemorrhoids that do not prolapse). Impression:            - One 2 mm polyp in the transverse colon, removed with                         a cold biopsy forceps. Resected and retrieved.                        - Non-bleeding internal hemorrhoids. Recommendation:        - Discharge patient to home.                        - Resume previous diet.                        - Continue present medications. Procedure Code(s):     --- Professional ---                        [phone], Colonoscopy, flexible; with biopsy, single or                         multiple Diagnosis Code(s):     --- Professional ---                        Z12.11, Encounter for screening for malignant neoplasm                         of colon  K63.5, Polyp of colon CPT copyright 2019 American Medical Association. All rights reserved. The codes documented in this report are preliminary and upon coder review may  be revised to meet current compliance requirements. [name] MD, MD 06/13/2020 9:17:17 AM This report has been signed electronically. Number of Addenda: 0 Note Initiated On: 06/13/2020 8:32 AM Scope Withdrawal Time: 0 hours 7 minutes 54 seconds  Total Procedure Duration: 0 hours 10 minutes 40 seconds  Estimated Blood Loss:  Estimated blood loss: none.       [facility]

## 2020-06-13 NOTE — Op Note (Signed)
[facility]astroenterology Patient Name: [name] Procedure Date: 06/13/2020 8:33 AM MRN: [id number] Account #: [id number] Date of Birth: 1965-11-10 Admit Type: Outpatient Age: 55 Room: [facility] ENDO ROOM 4 Gender: Female Note Status: Finalized Procedure:             Upper GI endoscopy Indications:           Nausea with vomiting Providers:             [name] MD, MD Medicines:             Propofol per Anesthesia Complications:         No immediate complications. Procedure:             Pre-Anesthesia Assessment:                        - Prior to the procedure, a History and Physical was                         performed, and patient medications and allergies were                         reviewed. The patient's tolerance of previous                         anesthesia was also reviewed. The risks and benefits                         of the procedure and the sedation options and risks                         were discussed with the patient. All questions were                         answered, and informed consent was obtained. Prior                         Anticoagulants: The patient has taken no previous                         anticoagulant or antiplatelet agents. ASA Grade                         Assessment: II - A patient with mild systemic disease.                         After reviewing the risks and benefits, the patient                         was deemed in satisfactory condition to undergo the                         procedure.                        After obtaining informed consent, the endoscope was                         passed under direct vision. Throughout the procedure,  the patient's blood pressure, pulse, and oxygen                         saturations were monitored continuously. The Endoscope                         was introduced through the mouth, and advanced to the                         second part of duodenum. The  upper GI endoscopy was                         accomplished without difficulty. The patient tolerated                         the procedure well. Findings:      A small hiatal hernia was present.      The stomach was normal.      The examined duodenum was normal. Impression:            - Small hiatal hernia.                        - Normal stomach.                        - Normal examined duodenum.                        - No specimens collected. Recommendation:        - Discharge patient to home.                        - Resume previous diet.                        - Continue present medications.                        - Perform a colonoscopy today. Procedure Code(s):     --- Professional ---                        [phone], Esophagogastroduodenoscopy, flexible,                         transoral; diagnostic, including collection of                         specimen(s) by brushing or washing, when performed                         (separate procedure) Diagnosis Code(s):     --- Professional ---                        R11.2, Nausea with vomiting, unspecified CPT copyright 2019 American Medical Association. All rights reserved. The codes documented in this report are preliminary and upon coder review may  be revised to meet current compliance requirements. [name] MD, MD 06/13/2020 9:02:35 AM This report has been signed electronically. Number of Addenda: 0 Note Initiated On: 06/13/2020 8:33 AM Estimated Blood Loss:  Estimated blood  loss: none.      [facility]

## 2020-06-13 NOTE — Transfer of Care (Signed)
Immediate Anesthesia Transfer of Care Note  Patient: Artice C Amundson  Procedure(s) Performed: COLONOSCOPY WITH PROPOFOL (N/A ) ESOPHAGOGASTRODUODENOSCOPY (EGD) WITH PROPOFOL (N/A )  Patient Location: PACU  Anesthesia Type:General  Level of Consciousness: awake and alert   Airway & Oxygen Therapy: Patient Spontanous Breathing  Post-op Assessment: Report given to RN and Post -op Vital signs reviewed and stable  Post vital signs: Reviewed and stable  Last Vitals:  Vitals Value Taken Time  BP    Temp    Pulse 79 06/13/20 0919  Resp 18 06/13/20 0919  SpO2 99 % 06/13/20 0919    Last Pain:  Vitals:   06/13/20 0811  TempSrc: Temporal  PainSc: 7          Complications: No complications documented.

## 2020-06-13 NOTE — Anesthesia Postprocedure Evaluation (Signed)
Anesthesia Post Note  Patient: [name]  Procedure(s) Performed: COLONOSCOPY WITH PROPOFOL (N/A ) ESOPHAGOGASTRODUODENOSCOPY (EGD) WITH PROPOFOL (N/A )  Patient location during evaluation: PACU Anesthesia Type: General Level of consciousness: awake and alert Pain management: pain level controlled Vital Signs Assessment: post-procedure vital signs reviewed and stable Respiratory status: spontaneous breathing, nonlabored ventilation and respiratory function stable Cardiovascular status: blood pressure returned to baseline and stable Postop Assessment: no apparent nausea or vomiting Anesthetic complications: no   No complications documented.   Last Vitals:  Vitals:   06/13/20 0939 06/13/20 0949  BP: (!) 150/94 (!) 144/93  Pulse: 76 71  Resp: 16 16  Temp:    SpO2: 100% 99%    Last Pain:  Vitals:   06/13/20 0949  TempSrc:   PainSc: 0-No pain                 Brett Canales Estella Malatesta

## 2020-06-13 NOTE — Interval H&P Note (Signed)
Becky Lame, MD [address] [place], [place] [address] Phone:[phone] Fax : [phone]  Primary Care Physician:  [name], PA-C Primary Gastroenterologist:  Dr. Allen Norris  Pre-Procedure History & Physical: HPI:  [name] is a 55 y.o. female is here for an endoscopy and colonoscopy.   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   TBI (traumatic brain injury) ([place])    SKULL FX 2009   Vertigo    Vertigo     Past Surgical History:  Procedure Laterality Date   abd pain     APPENDECTOMY     BACK SURGERY     LUMBAR FUSIONS X 2   CATARACT EXTRACTION W/PHACO Right 01/07/2015   Procedure: CATARACT EXTRACTION PHACO AND INTRAOCULAR LENS PLACEMENT ([place]);  Surgeon: Birder Robson, MD;  Location: [facility] ORS;  Service: Ophthalmology;  Laterality: Right;  [place] 0 AP% 0 CDE 0 fluid pack lot #[id number]   CATARACT EXTRACTION W/PHACO Left 01/28/2015   Procedure: CATARACT EXTRACTION PHACO AND INTRAOCULAR LENS PLACEMENT ([facility]);  Surgeon: Birder Robson, MD;  Location: [facility] ORS;  Service: Ophthalmology;  Laterality: Left;  [place] 00:18 AP%7.1 CDE1.32 fluid pack lot # [id number]   Cerebral Catheterization for Brain Bleed  02/02/2019   [facility]   COLONOSCOPY WITH PROPOFOL N/A 05/07/2020   Procedure: COLONOSCOPY WITH PROPOFOL;  Surgeon: Lin Landsman, MD;  Location: [facility] ENDOSCOPY;  Service: Gastroenterology;  Laterality: N/A;   ESOPHAGOGASTRODUODENOSCOPY (EGD) WITH PROPOFOL N/A 05/07/2020   Procedure: ESOPHAGOGASTRODUODENOSCOPY (EGD) WITH PROPOFOL;  Surgeon: Lin Landsman, MD;  Location: [facility] ENDOSCOPY;  Service: Gastroenterology;  Laterality: N/A;    LEEP     XI ROBOT ABDOMINAL PERINEAL RESECTION N/A 01/24/2019   Procedure: Robot assisted appendectomy ;  Surgeon: Ronny Bacon, MD;  Location: [facility] ORS;  Service: General;  Laterality: N/A;    Prior to Admission medications   Medication Sig Start Date End Date Taking? Authorizing Provider  albuterol (VENTOLIN HFA) 108 (90 Base) MCG/ACT inhaler INL 2 PFS PO Q 4 TO 6 H IF NEEDED 06/14/19  Yes [name], PA-C  butalbital-acetaminophen-caffeine (FIORICET) 50-325-40 MG tablet Take 1 tablet by mouth every 6 (six) hours as needed for headache. 03/26/19  Yes [name], PA-C  carisoprodol (SOMA) 350 MG tablet Take 700 mg by mouth 2 (two) times daily.    Yes [provider]  desvenlafaxine (PRISTIQ) 100 MG 24 hr tablet Take 1 tablet (100 mg total) by mouth daily. 05/10/20  Yes Charlynne Cousins, MD  Docusate Sodium (DSS) 100 MG CAPS TK ONE C PO BID PRF MILD CONSTIPATION 01/28/19  Yes [provider]  EMGALITY 120 MG/ML SOAJ Inject 1 mL into the muscle every 28 (twenty-eight) days. 04/24/20  Yes [provider]  fexofenadine (ALLEGRA) 180 MG tablet Take 180 mg by mouth daily.    Yes [provider]  lidocaine (XYLOCAINE) 2 % solution Use as directed 15 mLs in the mouth or throat every 4 (four) hours as needed for mouth pain. 05/10/20  Yes Charlynne Cousins, MD  lisinopril-hydrochlorothiazide (ZESTORETIC) 10-12.5 MG tablet Take  1 tablet by mouth daily. 06/09/20  Yes [name], PA-C  oxyCODONE (OXY IR/ROXICODONE) 5 MG immediate release tablet Take 5 mg by mouth 2 (two) times daily. 02/19/19  Yes [provider]  pantoprazole (PROTONIX) 40 MG tablet Take 1 tablet (40 mg total) by mouth 2 (two) times daily. 05/10/20  Yes Charlynne Cousins, MD  sucralfate (CARAFATE) 1 GM/10ML suspension Take 10 mLs (1 g total) by mouth 4 (four) times daily -  with meals and at bedtime. 05/10/20  Yes Charlynne Cousins, MD  Tiotropium Bromide Monohydrate (SPIRIVA  RESPIMAT) 2.5 MCG/ACT AERS Inhale 2 puffs into the lungs daily. 06/09/20  Yes [name], PA-C    Allergies as of 03/20/2020 - Review Complete 03/20/2020  Allergen Reaction Noted   Contrast media [iodinated diagnostic agents]  01/08/2016   Diphenhydramine hcl Hives and Itching 04/15/2015   Adhesive [tape]  01/01/2015   Iodine Other (See Comments) 05/05/2016   Prednisone Anxiety 07/27/2017   Sulfa antibiotics Rash 07/04/2012    Family History  Problem Relation Age of Onset   Ovarian cancer Maternal Aunt        41's   Depression Mother    Heart disease Father    Hypertension Father    Diabetes Father    Post-traumatic stress disorder Sister    Hypertension Sister    Diabetes Brother    Depression Brother    Hypertension Brother     Social History   Socioeconomic History   Marital status: Divorced    Spouse name: Not on file   Number of children: 1   Years of education: Not on file   Highest education level: Associate degree: occupational, Hotel manager, or vocational program  Occupational History    Comment: DISABLED  Tobacco Use   Smoking status: Current Every Day Smoker    Packs/day: 1.00    Types: Cigarettes   Smokeless tobacco: Never Used  Vaping Use   Vaping Use: Every day   Substances: CBD  Substance and Sexual Activity   Alcohol use: No   Drug use: Yes    Types: Marijuana   Sexual activity: Never  Other Topics Concern   Not on file  Social History Narrative   Not on file   Social Determinants of Health   Financial Resource Strain: Medium Risk   Difficulty of Paying Living Expenses: Somewhat hard  Food Insecurity: Food Insecurity Present   Worried About Estate manager/land agent of Food in the Last Year: Sometimes true   Ran Out of Food in the Last Year: Sometimes true  Transportation Needs: No Transportation Needs   Lack of Transportation (Medical): No   Lack of Transportation (Non-Medical): No  Physical Activity: Insufficiently Active   Days of Exercise per  Week: 1 day   Minutes of Exercise per Session: 20 min  Stress: Stress Concern Present   Feeling of Stress : Very much  Social Connections: Socially Isolated   Frequency of Communication with Friends and Family: More than three times a week   Frequency of Social Gatherings with Friends and Family: Once a week   Attends Religious Services: Never   Marine scientist or Organizations: No   Attends Music therapist: Never   Marital Status: Divorced  Human resources officer Violence: Not At Risk   Fear of Current or Ex-Partner: No   Emotionally Abused: No   Physically Abused: No   Sexually Abused: No    Review of Systems: See HPI, otherwise negative ROS  Physical Exam: BP  117/89   Pulse 77   Temp (!) 96.3 F (35.7 C) (Temporal)   Resp 20   Ht 5\' 8"  (1.727 m)   Wt 61.2 kg   SpO2 100%   BMI 20.53 kg/m  General:   Alert,  pleasant and cooperative in NAD Head:  Normocephalic and atraumatic. Neck:  Supple; no masses or thyromegaly. Lungs:  Clear throughout to auscultation.    Heart:  Regular rate and rhythm. Abdomen:  Soft, nontender and nondistended. Normal bowel sounds, without guarding, and without rebound.   Neurologic:  Alert and  oriented x4;  grossly normal neurologically.  Impression/Plan: Leilani Able Schlink is here for an endoscopy and colonoscopy to be performed for nausea constipation and abd pain  Risks, benefits, limitations, and alternatives regarding  endoscopy and colonoscopy have been reviewed with the patient.  Questions have been answered.  All parties agreeable.   Becky Lame, MD  06/13/2020, 8:34 AM

## 2020-06-13 NOTE — Anesthesia Preprocedure Evaluation (Addendum)
Anesthesia Evaluation  Patient identified by MRN, date of birth, ID band Patient awake    Reviewed: Allergy & Precautions, H&P , NPO status , Patient's Chart, lab work & pertinent test results  History of Anesthesia Complications Negative for: history of anesthetic complications  Airway Mallampati: II  TM Distance: >3 FB     Dental  (+)    Pulmonary neg sleep apnea, COPD, Current Smoker and Patient abstained from smoking.,    breath sounds clear to auscultation       Cardiovascular hypertension, (-) angina(-) Past MI and (-) Cardiac Stents (-) dysrhythmias  Rhythm:regular Rate:Normal     Neuro/Psych  Headaches, Seizures -,  PSYCHIATRIC DISORDERS Anxiety Depression    GI/Hepatic Neg liver ROS, GERD  Controlled,  Endo/Other  negative endocrine ROS  Renal/GU negative Renal ROS  negative genitourinary   Musculoskeletal   Abdominal   Peds  Hematology negative hematology ROS (+)   Anesthesia Other Findings Past Medical History: No date: BRBPR (bright red blood per rectum) No date: Breast mass, left No date: Cancer ([place])     Comment:  CERVICAL No date: Depression No date: Falls     Comment:  EASILY No date: GERD (gastroesophageal reflux disease) No date: PTSD (post-traumatic stress disorder) No date: PTSD (post-traumatic stress disorder) No date: Seizures ([place]) No date: Shoulder disorder     Comment:  LIMITED USE OF LEFT SHOULDER AND ARM No date: TBI (traumatic brain injury) ([place])     Comment:  SKULL FX 2009 No date: Vertigo No date: Vertigo  Past Surgical History: No date: abd pain No date: APPENDECTOMY No date: BACK SURGERY     Comment:  LUMBAR FUSIONS X 2 01/07/2015: CATARACT EXTRACTION W/PHACO; Right     Comment:  Procedure: CATARACT EXTRACTION PHACO AND INTRAOCULAR               LENS PLACEMENT ([facility]);  Surgeon: Birder Robson, MD;                Location: [facility] ORS;  Service: Ophthalmology;   Laterality:              Right;  [place] 0 AP% 0 CDE 0 fluid pack lot #[id number] 01/28/2015: CATARACT EXTRACTION W/PHACO; Left     Comment:  Procedure: CATARACT EXTRACTION PHACO AND INTRAOCULAR               LENS PLACEMENT ([facility]);  Surgeon: Birder Robson, MD;                Location: [facility] ORS;  Service: Ophthalmology;  Laterality:              Left;  [place] 00:18 AP%7.1 CDE1.32 fluid pack lot #               [id number] 02/02/2019: Cerebral Catheterization for Brain Bleed     Comment:  [facility] 05/07/2020: COLONOSCOPY WITH PROPOFOL; N/A     Comment:  Procedure: COLONOSCOPY WITH PROPOFOL;  Surgeon: Lin Landsman, MD;  Location: [facility] ENDOSCOPY;  Service:               Gastroenterology;  Laterality: N/A; 05/07/2020: ESOPHAGOGASTRODUODENOSCOPY (EGD) WITH PROPOFOL; N/A     Comment:  Procedure: ESOPHAGOGASTRODUODENOSCOPY (EGD) WITH               PROPOFOL;  Surgeon: Lin Landsman, MD;  Location:  [facility] ENDOSCOPY;  Service: Gastroenterology;  Laterality:               N/A; No date: LEEP 01/24/2019: XI ROBOT ABDOMINAL PERINEAL RESECTION; N/A     Comment:  Procedure: Robot assisted appendectomy ;  Surgeon:               Ronny Bacon, MD;  Location: [facility] ORS;  Service:               General;  Laterality: N/A;  BMI    Body Mass Index: 20.53 kg/m      Reproductive/Obstetrics negative OB ROS                            Anesthesia Physical Anesthesia Plan  ASA: III  Anesthesia Plan: General   Post-op Pain Management:    Induction:   PONV Risk Score and Plan: Propofol infusion and TIVA  Airway Management Planned: Nasal Cannula  Additional Equipment:   Intra-op Plan:   Post-operative Plan:   Informed Consent: I have reviewed the patients History and Physical, chart, labs and discussed the procedure including the risks, benefits and alternatives for the proposed anesthesia with the patient or authorized representative who  has indicated his/her understanding and acceptance.     Dental Advisory Given  Plan Discussed with: Anesthesiologist, CRNA and Surgeon  Anesthesia Plan Comments:         Anesthesia Quick Evaluation

## 2020-06-16 ENCOUNTER — Encounter: Payer: Self-pay | Admitting: Gastroenterology

## 2020-06-16 LAB — SURGICAL PATHOLOGY

## 2020-06-17 NOTE — Progress Notes (Signed)
Thanks!  We can have staff help put in referrals to [employer] and [place] with a page with their contact info?  For medicaid pt in [place] these are probably the only options we have. But it would be nice if [employer] helped [place] with a plan for continuity of care for these pt who need them! Thanks LT

## 2020-06-19 ENCOUNTER — Ambulatory Visit (INDEPENDENT_AMBULATORY_CARE_PROVIDER_SITE_OTHER): Payer: 59 | Admitting: Gastroenterology

## 2020-06-19 ENCOUNTER — Other Ambulatory Visit: Payer: Self-pay

## 2020-06-19 ENCOUNTER — Encounter: Payer: Self-pay | Admitting: Gastroenterology

## 2020-06-19 VITALS — BP 106/76 | HR 98 | Ht 68.0 in | Wt 136.0 lb

## 2020-06-19 DIAGNOSIS — K5903 Drug induced constipation: Secondary | ICD-10-CM | POA: Diagnosis not present

## 2020-06-19 DIAGNOSIS — K3184 Gastroparesis: Secondary | ICD-10-CM | POA: Diagnosis not present

## 2020-06-19 NOTE — Progress Notes (Signed)
[name] MD, MRCP(U.K) [address]  [place]  [place],  [address]  Main: [phone]  Fax: [phone]   Primary Care Physician: [name], PA-C  Primary Gastroenterologist:  Dr. [name]   Follow-up for abdominal pain and constipation  HPI: [name] is a 55 y.o. female Summary of history :  Initially referred and seen on 03/20/2020 for abdominal pain bloating and constipation.X-ray of the abdomen in November 2021 showed mild to moderate increasing colonic stool burden. CT scan of the abdomen in December 2021 demonstrated resolution of previously seen small bowel obstruction, moderate constipation.November 2021: TSH normal, hemoglobin 15.8, CMP normal except an elevated calcium and alkaline phosphatase.  At her initial visit she had complained of a sense of bloating and early satiety she had been on oxycodone.  History of traumatic brain injury.  She was admitted in March 2022 with abdominal pain nausea vomiting and found to have a small bowel obstruction.  EGD showed an esophageal ulcer with no stigmata of bleeding colonoscopy was attempted but inadequate preparation.She was seen by general surgery.   03/20/2020: H. pylori breath test negative.  PTH normal, GGT 24 normal, fractionated alkaline phosphatase normal.  ANA negative.  AMA negative.  CMP showed isolated elevation of alkaline phosphatase at 179.  05/01/2020 CT abdomen and pelvis without contrast demonstrated findings consistent with partial small bowel obstruction 05/02/2020: Right upper quadrant ultrasound normal    Interval history 05/21/2020-06/19/2020  06/13/2020: Colonoscopy : 2 mm polyp resected. EGD: small hiatal hernia.  06/05/2020: MR enterogram : Large burden of stool in colon and rectum . No bowel obstruction.  05/04/2020: CMP: Normal AST, ALT, alkaline phosphatase  Continues to take Protonix twice a day, smoking daily.  She states she has a bowel movement every day but feels much  better in terms of bloating and abdominal discomfort when she has a very good bowel movement.   Current Outpatient Medications  Medication Sig Dispense Refill  . albuterol (VENTOLIN HFA) 108 (90 Base) MCG/ACT inhaler INL 2 PFS PO Q 4 TO 6 H IF NEEDED 18 g 3  . butalbital-acetaminophen-caffeine (FIORICET) 50-325-40 MG tablet Take 1 tablet by mouth every 6 (six) hours as needed for headache. 30 tablet 0  . carisoprodol (SOMA) 350 MG tablet Take 700 mg by mouth 2 (two) times daily.     Marland Kitchen desvenlafaxine (PRISTIQ) 100 MG 24 hr tablet Take 1 tablet (100 mg total) by mouth daily. 30 tablet 3  . Docusate Sodium (DSS) 100 MG CAPS TK ONE C PO BID PRF MILD CONSTIPATION    . EMGALITY 120 MG/ML SOAJ Inject 1 mL into the muscle every 28 (twenty-eight) days.    . fexofenadine (ALLEGRA) 180 MG tablet Take 180 mg by mouth daily.     Marland Kitchen lidocaine (XYLOCAINE) 2 % solution Use as directed 15 mLs in the mouth or throat every 4 (four) hours as needed for mouth pain. 120 mL 3  . lisinopril-hydrochlorothiazide (ZESTORETIC) 10-12.5 MG tablet Take 1 tablet by mouth daily. 90 tablet 0  . oxyCODONE (OXY IR/ROXICODONE) 5 MG immediate release tablet Take 5 mg by mouth 2 (two) times daily.    . pantoprazole (PROTONIX) 40 MG tablet Take 1 tablet (40 mg total) by mouth 2 (two) times daily. 180 tablet 0  . sucralfate (CARAFATE) 1 GM/10ML suspension Take 10 mLs (1 g total) by mouth 4 (four) times daily -  with meals and at bedtime. 420 mL 0  . Tiotropium Bromide Monohydrate (SPIRIVA RESPIMAT) 2.5  MCG/ACT AERS Inhale 2 puffs into the lungs daily. 4 g 5   No current facility-administered medications for this visit.    Allergies as of 06/19/2020 - Review Complete 06/19/2020  Allergen Reaction Noted  . Contrast media [iodinated diagnostic agents]  01/08/2016  . Diphenhydramine hcl Hives and Itching 04/15/2015  . Adhesive [tape]  01/01/2015  . Iodine Other (See Comments) 05/05/2016  . Prednisone Anxiety 07/27/2017  . Sulfa  antibiotics Rash 07/04/2012    ROS:  General: Negative for anorexia, weight loss, fever, chills, fatigue, weakness. ENT: Negative for hoarseness, difficulty swallowing , nasal congestion. CV: Negative for chest pain, angina, palpitations, dyspnea on exertion, peripheral edema.  Respiratory: Negative for dyspnea at rest, dyspnea on exertion, cough, sputum, wheezing.  GI: See history of present illness. GU:  Negative for dysuria, hematuria, urinary incontinence, urinary frequency, nocturnal urination.  Endo: Negative for unusual weight change.    Physical Examination:   BP 106/76 (BP Location: Left Arm, Patient Position: Sitting, Cuff Size: Normal)   Pulse 98   Ht 5\' 8"  (1.727 m)   Wt 136 lb (61.7 kg) Comment: patient reports  BMI 20.68 kg/m   General: Well-nourished, well-developed in no acute distress.  Eyes: No icterus. Conjunctivae pink. Neuro: Alert and oriented x 3.  Grossly intact. Skin: Warm and dry, no jaundice.   Psych: Alert and cooperative, normal mood and affect.   Imaging Studies: MR ENTERO ABDOMEN W WO CONTRAST  Result Date: 06/05/2020 CLINICAL DATA:  Recent small-bowel obstruction, evaluate for obstructing etiology, chronic narcotic use and dysmotility EXAM: MR ABDOMEN AND PELVIS WITHOUT AND WITH CONTRAST (MR ENTEROGRAPHY) TECHNIQUE: Multiplanar, multisequence MRI of the abdomen and pelvis was performed both before and during bolus administration of intravenous contrast. Negative oral contrast VoLumen was given. CONTRAST:  50mL GADAVIST GADOBUTROL 1 MMOL/ML IV SOLN COMPARISON:  CT abdomen pelvis, 05/01/2020 FINDINGS: COMBINED FINDINGS FOR BOTH MR ABDOMEN AND PELVIS Lower chest: No acute findings. Hepatobiliary: No mass or other parenchymal abnormality identified. Pancreas: No mass, inflammatory changes, or other parenchymal abnormality identified. Spleen:  Within normal limits in size and appearance. Adrenals/Urinary Tract: No masses identified. 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 No evidence of hydronephrosis. Stomach/Bowel: Generally large burden of stool throughout the colon and rectum. No inflammatory findings or other abnormality of the partially distended small bowel. No evidence of bowel obstruction at this time. Vascular/Lymphatic: No pathologically enlarged lymph nodes identified. No abdominal aortic aneurysm demonstrated. Aortic atherosclerosis. Reproductive: Uterus and bilateral adnexa are unremarkable. Other:  None. Musculoskeletal: No suspicious bone lesions identified. IMPRESSION: 1. No inflammatory findings or other abnormality of the partially distended small bowel. 2. Generally large burden of stool throughout the colon and rectum. 3. No evidence of bowel obstruction at this time. 4. 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 No hydronephrosis. Aortic Atherosclerosis (ICD10-I70.0). Electronically Signed   By: Eddie Candle M.D.   On: 06/05/2020 11:14   MR ENTERO PELVIS W WO CONTRAST  Result Date: 06/05/2020 CLINICAL DATA:  Recent small-bowel obstruction, evaluate for obstructing etiology, chronic narcotic use and dysmotility EXAM: MR ABDOMEN AND PELVIS WITHOUT AND WITH CONTRAST (MR ENTEROGRAPHY) TECHNIQUE: Multiplanar, multisequence MRI of the abdomen and pelvis was performed both before and during bolus administration of intravenous contrast. Negative oral contrast VoLumen was given. CONTRAST:  70mL GADAVIST GADOBUTROL 1 MMOL/ML IV SOLN COMPARISON:  CT abdomen pelvis, 05/01/2020 FINDINGS: COMBINED FINDINGS FOR BOTH MR ABDOMEN AND PELVIS Lower chest: No acute findings. Hepatobiliary: No mass or other parenchymal abnormality  identified. Pancreas: No mass, inflammatory changes, or other parenchymal abnormality identified. Spleen:  Within normal limits in size and appearance. Adrenals/Urinary Tract: No masses identified. 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 No evidence of hydronephrosis. Stomach/Bowel:  Generally large burden of stool throughout the colon and rectum. No inflammatory findings or other abnormality of the partially distended small bowel. No evidence of bowel obstruction at this time. Vascular/Lymphatic: No pathologically enlarged lymph nodes identified. No abdominal aortic aneurysm demonstrated. Aortic atherosclerosis. Reproductive: Uterus and bilateral adnexa are unremarkable. Other:  None. Musculoskeletal: No suspicious bone lesions identified. IMPRESSION: 1. No inflammatory findings or other abnormality of the partially distended small bowel. 2. Generally large burden of stool throughout the colon and rectum. 3. No evidence of bowel obstruction at this time. 4. 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 No hydronephrosis. Aortic Atherosclerosis (ICD10-I70.0). Electronically Signed   By: Eddie Candle M.D.   On: 06/05/2020 11:14    Assessment and Plan:   [name] is a 55 y.o. y/o female here to follow-up for abdominal pain, nausea vomiting, constipation. Labs in November 2021 demonstrated an isolated elevation in alkaline phosphatase and total calcium which are resolved when checked in March 2022. Her GI symptoms are suggestive of gastroparesis likely secondary to use of oxycodone.  Likely constipation t secondary to opioid use.  Prior history of bloating could also be related to [employer]  production secondary to constipation.   Plan 1.  Continue PPI at present dose 2.  Commence on Linzess 145 mcg daily for constipation.  We will give her 2 weeks of samples.  If response will give her a prescription if does not respond we will increase dose 5.  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

## 2020-06-19 NOTE — Patient Instructions (Signed)
Patient was given (4) boxes of Linzess in office today.

## 2020-07-07 ENCOUNTER — Telehealth: Payer: Self-pay

## 2020-07-07 NOTE — Telephone Encounter (Signed)
Patients would like to get a prescription for Linzess. Has used the samples and would a higher dosage?

## 2020-07-08 ENCOUNTER — Other Ambulatory Visit: Payer: Self-pay

## 2020-07-08 MED ORDER — LINACLOTIDE 290 MCG PO CAPS: 290.0000 ug | ORAL_CAPSULE | Freq: Every day | ORAL | 3 refills | Status: DC

## 2020-07-08 NOTE — Telephone Encounter (Signed)
Yes give prescription for linzess 290 mcg 90 day with 3 refills

## 2020-07-17 ENCOUNTER — Ambulatory Visit (INDEPENDENT_AMBULATORY_CARE_PROVIDER_SITE_OTHER): Payer: 59 | Admitting: Gastroenterology

## 2020-07-17 ENCOUNTER — Other Ambulatory Visit: Payer: Self-pay

## 2020-07-17 ENCOUNTER — Encounter: Payer: Self-pay | Admitting: Gastroenterology

## 2020-07-17 VITALS — BP 103/71 | HR 83 | Ht 68.0 in

## 2020-07-17 DIAGNOSIS — K5903 Drug induced constipation: Secondary | ICD-10-CM

## 2020-07-17 DIAGNOSIS — K3184 Gastroparesis: Secondary | ICD-10-CM

## 2020-07-17 NOTE — Progress Notes (Signed)
[name] MD, MRCP(U.K) [address]  [place]  [place], [place] [address]  Main: [phone]  Fax: [phone]   Primary Care Physician: [name], PA-C  Primary Gastroenterologist:  Dr. [name]   Follow up for Opoid induced constipation and gastroparesis.   HPI: [name] is a 55 y.o. female   Summary of history :  Initially referred and seen on 03/20/2020 for abdominal pain bloating and constipation.X-ray of the abdomen in November 2021 showed mild to moderate increasing colonic stool burden. CT scan of the abdomen in December 2021 demonstrated resolution of previously seen small bowel obstruction, moderate constipation.November 2021: TSH normal, hemoglobin 15.8, CMP normal except an elevated calcium and alkaline phosphatase.  At her initial visit she had complained of a sense of bloating and early satiety she had been on oxycodone. History of traumatic brain injury.  She was admitted in March 2022 with abdominal pain nausea vomiting and found to have a small bowel obstruction. EGD showed an esophageal ulcer with no stigmata of bleeding colonoscopy was attempted but inadequate preparation.She was seen by general surgery.   03/20/2020: H. pylori breath test negative. PTH normal, GGT 24 normal, fractionated alkaline phosphatase normal. ANA negative. AMA negative. CMP showed isolated elevation of alkaline phosphatase at 179.  05/01/2020 CT abdomen and pelvis without contrast demonstrated findings consistent with partial small bowel obstruction 05/02/2020: Right upper quadrant ultrasound normal 06/13/2020: Colonoscopy : 2 mm polyp resected. EGD: small hiatal hernia.  06/05/2020: MR enterogram : Large burden of stool in colon and rectum . No bowel obstruction.  05/04/2020: CMP: Normal AST, ALT, alkaline phosphatase   Interval history4/21/2022-07/17/2020  She is doing well on 290 mcg of Linzess daily.  Having a bowel movement every day.  Still has features  of early satiety.  She is yet to obtain her gastric emptying study. Taking her Protonix daily.  Current Outpatient Medications  Medication Sig Dispense Refill  . albuterol (VENTOLIN HFA) 108 (90 Base) MCG/ACT inhaler INL 2 PFS PO Q 4 TO 6 H IF NEEDED 18 g 3  . butalbital-acetaminophen-caffeine (FIORICET) 50-325-40 MG tablet Take 1 tablet by mouth every 6 (six) hours as needed for headache. 30 tablet 0  . carisoprodol (SOMA) 350 MG tablet Take 700 mg by mouth 2 (two) times daily.     Marland Kitchen desvenlafaxine (PRISTIQ) 100 MG 24 hr tablet Take 1 tablet (100 mg total) by mouth daily. 30 tablet 3  . Docusate Sodium (DSS) 100 MG CAPS TK ONE C PO BID PRF MILD CONSTIPATION    . EMGALITY 120 MG/ML SOAJ Inject 1 mL into the muscle every 28 (twenty-eight) days.    . fexofenadine (ALLEGRA) 180 MG tablet Take 180 mg by mouth daily.     Marland Kitchen lidocaine (XYLOCAINE) 2 % solution Use as directed 15 mLs in the mouth or throat every 4 (four) hours as needed for mouth pain. 120 mL 3  . linaclotide (LINZESS) 290 MCG CAPS capsule Take 1 capsule (290 mcg total) by mouth daily before breakfast. 90 capsule 3  . lisinopril-hydrochlorothiazide (ZESTORETIC) 10-12.5 MG tablet Take 1 tablet by mouth daily. 90 tablet 0  . oxyCODONE (OXY IR/ROXICODONE) 5 MG immediate release tablet Take 5 mg by mouth 2 (two) times daily.    . pantoprazole (PROTONIX) 40 MG tablet Take 1 tablet (40 mg total) by mouth 2 (two) times daily. 180 tablet 0  . sucralfate (CARAFATE) 1 GM/10ML suspension Take 10 mLs (1 g total) by mouth 4 (four) times daily -  with meals and at bedtime. 420 mL 0  . Tiotropium Bromide Monohydrate (SPIRIVA RESPIMAT) 2.5 MCG/ACT AERS Inhale 2 puffs into the lungs daily. 4 g 5   No current facility-administered medications for this visit.    Allergies as of 07/17/2020 - Review Complete 07/17/2020  Allergen Reaction Noted  . Contrast media [iodinated diagnostic agents]  01/08/2016  . Diphenhydramine hcl Hives and Itching 04/15/2015   . Adhesive [tape]  01/01/2015  . Iodine Other (See Comments) 05/05/2016  . Prednisone Anxiety 07/27/2017  . Sulfa antibiotics Rash 07/04/2012    ROS:  General: Negative for anorexia, weight loss, fever, chills, fatigue, weakness. ENT: Negative for hoarseness, difficulty swallowing , nasal congestion. CV: Negative for chest pain, angina, palpitations, dyspnea on exertion, peripheral edema.  Respiratory: Negative for dyspnea at rest, dyspnea on exertion, cough, sputum, wheezing.  GI: See history of present illness. GU:  Negative for dysuria, hematuria, urinary incontinence, urinary frequency, nocturnal urination.  Endo: Negative for unusual weight change.    Physical Examination:   BP 103/71 (BP Location: Left Arm, Patient Position: Sitting, Cuff Size: Normal)   Pulse 83   Ht 5\' 8"  (1.727 m)   BMI 20.68 kg/m   General: Well-nourished, well-developed in no acute distress.  Eyes: No icterus. Conjunctivae pink. Neuro: Alert and oriented x 3.  Grossly intact. Skin: Warm and dry, no jaundice.   Psych: Alert and cooperative, normal mood and affect.   Imaging Studies: No results found.  Assessment and Plan:   [name] is a 55 y.o. y/o female here to follow-upfor abdominal pain, nausea vomiting, constipation. Her GI symptoms are suggestive of gastroparesis likely secondary to use of oxycodone.   Prior history of bloating could also be related to [employer]  production secondary to constipation.  She has responded well to [place] 1.  Continue Linzess 290 mcg daily Suniga-term. 2.  High-fiber diet patient information provided. 3.  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   Dr [name]  MD,MRCP [facility]) Follow up in as needed

## 2020-08-13 ENCOUNTER — Other Ambulatory Visit: Payer: Self-pay

## 2020-08-14 ENCOUNTER — Other Ambulatory Visit: Payer: Self-pay

## 2020-08-14 DIAGNOSIS — K219 Gastro-esophageal reflux disease without esophagitis: Secondary | ICD-10-CM

## 2020-08-14 MED ORDER — PANTOPRAZOLE SODIUM 40 MG PO TBEC: 40.0000 mg | DELAYED_RELEASE_TABLET | Freq: Two times a day (BID) | ORAL | 0 refills | Status: DC

## 2020-08-15 ENCOUNTER — Ambulatory Visit: Payer: 59

## 2020-08-18 ENCOUNTER — Other Ambulatory Visit: Payer: Self-pay | Admitting: Family Medicine

## 2020-08-18 DIAGNOSIS — I10 Essential (primary) hypertension: Secondary | ICD-10-CM

## 2020-09-08 ENCOUNTER — Other Ambulatory Visit: Payer: Self-pay

## 2020-09-08 ENCOUNTER — Other Ambulatory Visit (HOSPITAL_COMMUNITY)
Admission: RE | Admit: 2020-09-08 | Discharge: 2020-09-08 | Disposition: A | Discharge status: Home / Self Care | Payer: 59 | Source: Ambulatory Visit | Attending: Family Medicine | Admitting: Family Medicine

## 2020-09-08 ENCOUNTER — Encounter: Payer: Self-pay | Admitting: Family Medicine

## 2020-09-08 ENCOUNTER — Ambulatory Visit (INDEPENDENT_AMBULATORY_CARE_PROVIDER_SITE_OTHER): Payer: 59 | Admitting: Family Medicine

## 2020-09-08 VITALS — BP 102/74 | HR 91 | Temp 98.4°F | Resp 16 | Ht 68.0 in | Wt 131.5 lb

## 2020-09-08 DIAGNOSIS — Z1211 Encounter for screening for malignant neoplasm of colon: Secondary | ICD-10-CM | POA: Diagnosis not present

## 2020-09-08 DIAGNOSIS — F17200 Nicotine dependence, unspecified, uncomplicated: Secondary | ICD-10-CM

## 2020-09-08 DIAGNOSIS — F17210 Nicotine dependence, cigarettes, uncomplicated: Secondary | ICD-10-CM

## 2020-09-08 DIAGNOSIS — Z1151 Encounter for screening for human papillomavirus (HPV): Secondary | ICD-10-CM | POA: Diagnosis not present

## 2020-09-08 DIAGNOSIS — Z0000 Encounter for general adult medical examination without abnormal findings: Secondary | ICD-10-CM | POA: Insufficient documentation

## 2020-09-08 DIAGNOSIS — I10 Essential (primary) hypertension: Secondary | ICD-10-CM | POA: Diagnosis not present

## 2020-09-08 DIAGNOSIS — Z01419 Encounter for gynecological examination (general) (routine) without abnormal findings: Secondary | ICD-10-CM | POA: Diagnosis not present

## 2020-09-08 DIAGNOSIS — F39 Unspecified mood [affective] disorder: Secondary | ICD-10-CM

## 2020-09-08 DIAGNOSIS — J449 Chronic obstructive pulmonary disease, unspecified: Secondary | ICD-10-CM

## 2020-09-08 DIAGNOSIS — F0630 Mood disorder due to known physiological condition, unspecified: Secondary | ICD-10-CM

## 2020-09-08 DIAGNOSIS — F482 Pseudobulbar affect: Secondary | ICD-10-CM

## 2020-09-08 DIAGNOSIS — F4310 Post-traumatic stress disorder, unspecified: Secondary | ICD-10-CM

## 2020-09-08 DIAGNOSIS — S069X9S Unspecified intracranial injury with loss of consciousness of unspecified duration, sequela: Secondary | ICD-10-CM

## 2020-09-08 MED ORDER — DESVENLAFAXINE SUCCINATE ER 100 MG PO TB24: 100.0000 mg | ORAL_TABLET | Freq: Every day | ORAL | 11 refills | Status: DC

## 2020-09-08 NOTE — Patient Instructions (Signed)
Preventive Care 68-53 Years Old, Female Preventive care refers to lifestyle choices and visits with your health care provider that can promote health and wellness. This includes: A yearly physical exam. This is also called an annual wellness visit. Regular dental and eye exams. Immunizations. Screening for certain conditions. Healthy lifestyle choices, such as: Eating a healthy diet. Getting regular exercise. Not using drugs or products that contain nicotine and tobacco. Limiting alcohol use. What can I expect for my preventive care visit? Physical exam Your health care provider will check your: Height and weight. These may be used to calculate your BMI (body mass index). BMI is a measurement that tells if you are at a healthy weight. Heart rate and blood pressure. Body temperature. Skin for abnormal spots. Counseling Your health care provider may ask you questions about your: Past medical problems. Family's medical history. Alcohol, tobacco, and drug use. Emotional well-being. Home life and relationship well-being. Sexual activity. Diet, exercise, and sleep habits. Work and work Statistician. Access to firearms. Method of birth control. Menstrual cycle. Pregnancy history. What immunizations do I need?  Vaccines are usually given at various ages, according to a schedule. Your health care provider will recommend vaccines for you based on your age, medicalhistory, and lifestyle or other factors, such as travel or where you work. What tests do I need? Blood tests Lipid and cholesterol levels. These may be checked every 5 years, or more often if you are over 37 years old. Hepatitis C test. Hepatitis B test. Screening Lung cancer screening. You may have this screening every year starting at age 30 if you have a 30-pack-year history of smoking and currently smoke or have quit within the past 15 years. Colorectal cancer screening. All adults should have this screening starting at  age 23 and continuing until age 3. Your health care provider may recommend screening at age 88 if you are at increased risk. You will have tests every 1-10 years, depending on your results and the type of screening test. Diabetes screening. This is done by checking your blood sugar (glucose) after you have not eaten for a while (fasting). You may have this done every 1-3 years. Mammogram. This may be done every 1-2 years. Talk with your health care provider about when you should start having regular mammograms. This may depend on whether you have a family history of breast cancer. BRCA-related cancer screening. This may be done if you have a family history of breast, ovarian, tubal, or peritoneal cancers. Pelvic exam and Pap test. This may be done every 3 years starting at age 79. Starting at age 54, this may be done every 5 years if you have a Pap test in combination with an HPV test. Other tests STD (sexually transmitted disease) testing, if you are at risk. Bone density scan. This is done to screen for osteoporosis. You may have this scan if you are at high risk for osteoporosis. Talk with your health care provider about your test results, treatment options,and if necessary, the need for more tests. Follow these instructions at home: Eating and drinking  Eat a diet that includes fresh fruits and vegetables, whole grains, lean protein, and low-fat dairy products. Take vitamin and mineral supplements as recommended by your health care provider. Do not drink alcohol if: Your health care provider tells you not to drink. You are pregnant, may be pregnant, or are planning to become pregnant. If you drink alcohol: Limit how much you have to 0-1 drink a day. Be aware  of how much alcohol is in your drink. In the [place] one drink equals one 12 oz bottle of beer (355 mL), one 5 oz glass of wine (148 mL), or one 1 oz glass of hard liquor (44 mL).  Lifestyle Take daily care of your teeth and  gums. Brush your teeth every morning and night with fluoride toothpaste. Floss one time each day. Stay active. Exercise for at least 30 minutes 5 or more days each week. Do not use any products that contain nicotine or tobacco, such as cigarettes, e-cigarettes, and chewing tobacco. If you need help quitting, ask your health care provider. Do not use drugs. If you are sexually active, practice safe sex. Use a condom or other form of protection to prevent STIs (sexually transmitted infections). If you do not wish to become pregnant, use a form of birth control. If you plan to become pregnant, see your health care provider for a prepregnancy visit. If told by your health care provider, take low-dose aspirin daily starting at age 29. Find healthy ways to cope with stress, such as: Meditation, yoga, or listening to music. Journaling. Talking to a trusted person. Spending time with friends and family. Safety Always wear your seat belt while driving or riding in a vehicle. Do not drive: If you have been drinking alcohol. Do not ride with someone who has been drinking. When you are tired or distracted. While texting. Wear a helmet and other protective equipment during sports activities. If you have firearms in your house, make sure you follow all gun safety procedures. What's next? Visit your health care provider once a year for an annual wellness visit. Ask your health care provider how often you should have your eyes and teeth checked. Stay up to date on all vaccines. This information is not intended to replace advice given to you by your health care provider. Make sure you discuss any questions you have with your healthcare provider. Document Revised: 11/20/2019 Document Reviewed: 10/27/2017 Elsevier Patient Education  2022 [employer].

## 2020-09-08 NOTE — Progress Notes (Signed)
Patient: [name], Female    DOB: 1965-08-05, 55 y.o.   MRN: [id number] [name], PA-C Visit Date: 09/08/2020  Today's Provider: [name], PA-C   Chief Complaint  Patient presents with   Annual Exam    With pap   Subjective:   Annual physical exam:  RYELYNN Davies is a 54 y.o. female who presents today for complete physical exam:  Exercise/Activity:  3 d a week 50+ min  Diet/nutrition:   Sleep:  not sleeping well, chronic    USPSTF grade A and B recommendations - reviewed and addressed today  Depression:  Phq 9 completed today by patient, was reviewed by me with patient in the room PHQ score is positive, pt feels baseline  needs to est with psych still PHQ 2/9 Scores 09/08/2020 06/09/2020 01/17/2020 01/17/2020  PHQ - 2 Score _0 PHQ- 9 Score _1 -   Depression screen [facility] 2/9 09/08/2020 06/09/2020 01/17/2020 01/17/2020 07/16/2019  Decreased Interest _2 Down, Depressed, Hopeless _3 PHQ - 2 Score _4 Altered sleeping _5 - 2  Tired, decreased energy _6 - 1  Change in appetite _7 - 0  Feeling bad or failure about yourself  _8 - 1  Trouble concentrating _9 - 1  Moving slowly or fidgety/restless _10 - 1  Suicidal thoughts 0 0 0 - 0  PHQ-9 Score _11 - 8  Difficult doing work/chores Somewhat difficult Very difficult Very difficult - Somewhat difficult    Alcohol screening: [place] Office Visit from 01/17/2020 in [facility]  AUDIT-C Score 0       Immunizations and Health Maintenance: Health Maintenance  Topic Date Due   PAP SMEAR-Modifier  Never done   MAMMOGRAM  07/15/2017   COVID-19 Vaccine (1) 09/24/2020 (Originally 04/13/1970)   Zoster Vaccines- Shingrix (1 of 2) 12/09/2020 (Originally 04/13/2017)   Pneumococcal Vaccine 99-17 Years old (1 - PCV) 09/08/2021 (Originally 04/13/1973)   INFLUENZA VACCINE  09/29/2020   COLONOSCOPY (Pts 45-31yr Insurance coverage will need to be  confirmed)  06/14/2027   TETANUS/TDAP  09/06/2027   Hepatitis C Screening  Completed   HIV Screening  Completed   HPV VACCINES  Aged Out     Hep C Screening: done  STD testing and prevention (HIV/chl/gon/syphilis):  see above, no additional testing desired by pt today - none, not sexually active >659yr Intimate partner violence:  denies, feels safe  Sexual History/Pain during Intercourse: Divorced  Menstrual History/LMP/Abnormal Bleeding:  no abnormal uterine bleeding, postmenopausal No LMP recorded. Patient is postmenopausal.  Incontinence Symptoms: none  Breast cancer: due- last 2019, hx of mass to left breast, she doesn't remember dx Last Mammogram: *see HM list above BRCA gene screening: none known per pt  Cervical cancer screening: hx of LEEP procedure in 20's - due for pap today Pt denies family hx of cancers - breast, ovarian, uterine, colon:     Osteoporosis:   Discussion on osteoporosis per age, including high calcium and vitamin D supplementation, weight bearing exercises Pt is supplementing with daily calcium/Vit D.   Skin cancer:  Hx of skin CA -  NO Discussed atypical lesions   Colorectal cancer:   Colonoscopy is UTD Discussed concerning signs and sx of CRC, pt denies bowel changes, - chronic abd pain/gastroparesis/constipation - est with  GI, no melena, imaging CT and past colonoscopy reviewed today  Lung cancer:   Low Dose CT Chest recommended if Age 35-80 years, 20 pack-year currently smoking OR have quit w/in 15years. Patient does qualify.    Social History   Tobacco Use   Smoking status: Every Day    Packs/day: 1.00    Pack years: 0.00    Types: Cigarettes   Smokeless tobacco: Never  Vaping Use   Vaping Use: Every day   Substances: CBD  Substance Use Topics   Alcohol use: No   Drug use: Yes    Types: Marijuana     Flowsheet Row [facility] Visit from [facility]/17/[facility] in [facility]  AUDIT-C Score 0       Family History   Problem Relation Age of Onset   Ovarian cancer Maternal Aunt        20's   Depression Mother    Heart disease Father    Hypertension Father    Diabetes Father    Post-traumatic stress disorder Sister    Hypertension Sister    Diabetes Brother    Depression Brother    Hypertension Brother      Blood pressure/Hypertension: BP Readings from Last 3 Encounters:  09/08/20 102/74  07/17/20 103/71  06/19/20 106/76    Weight/Obesity: Wt Readings from Last 3 Encounters:  09/08/20 131 lb 8 oz (59.6 kg)  06/19/20 136 lb (61.7 kg)  06/13/20 135 lb (61.2 kg)   BMI Readings from Last 3 Encounters:  09/08/20 19.99 kg/m  07/17/20 20.68 kg/m  06/19/20 20.68 kg/m     Lipids:  Lab Results  Component Value Date   CHOL 269 (A) 09/26/2018   Lab Results  Component Value Date   HDL 47 09/26/2018   No results found for: [facility] Lab Results  Component Value Date   TRIG 92 09/26/2018   No results found for: CHOLHDL No results found for: LDLDIRECT Based on the results of lipid panel his/her cardiovascular risk factor ( using Regal )  in the next 10 years is: The 10-year ASCVD risk score Mikey Bussing DC Brooke Bonito., et al., 2013) is: 6%*   Values used to calculate the score:     Age: 31 years     Sex: Female     Is Non-Hispanic African American: No     Diabetic: No     Tobacco smoker: Yes     Systolic Blood Pressure: 397 mmHg     Is BP treated: Yes     HDL Cholesterol: 47 mg/dL*     Total Cholesterol: 269 mg/dL*     * - Cholesterol units were assumed for this score calculation Glucose:  Glucose  Date Value Ref Range Status  12/17/2013 104 (H) 65 - 99 mg/dL Final  12/11/2013 110 (H) 65 - 99 mg/dL Final  08/26/2013 92 65 - 99 mg/dL Final   Glucose, Bld  Date Value Ref Range Status  05/05/2020 92 70 - 99 mg/dL Final    Comment:    Glucose reference range applies only to samples taken after fasting for at least 8 hours.  05/04/2020 89 70 - 99 mg/dL Final    Comment:    Glucose  reference range applies only to samples taken after fasting for at least 8 hours.  05/02/2020 104 (H) 70 - 99 mg/dL Final    Comment:    Glucose reference range applies only to samples taken after fasting for at least 8 hours.   Glucose-Capillary  Date  Value Ref Range Status  01/30/2019 105 (H) 70 - 99 mg/dL Final  01/30/2019 84 70 - 99 mg/dL Final  01/23/2019 158 (H) 70 - 99 mg/dL Final   Hypertension: BP Readings from Last 3 Encounters:  09/08/20 102/74  07/17/20 103/71  06/19/20 106/76   Obesity: Wt Readings from Last 3 Encounters:  09/08/20 131 lb 8 oz (59.6 kg)  06/19/20 136 lb (61.7 kg)  06/13/20 135 lb (61.2 kg)   BMI Readings from Last 3 Encounters:  09/08/20 19.99 kg/m  07/17/20 20.68 kg/m  06/19/20 20.68 kg/m      Advanced Care Planning:  A voluntary discussion about advance care planning including the explanation and discussion of advance directives.   Discussed health care proxy and Living will, and the patient was able to identify a health care proxy as son and sister.   Patient does not have a living will at present time.   Social History      She        Social History   Socioeconomic History   Marital status: Divorced    Spouse name: Not on file   Number of children: 1   Years of education: Not on file   Highest education level: Associate degree: occupational, Hotel manager, or vocational program  Occupational History    Comment: DISABLED  Tobacco Use   Smoking status: Every Day    Packs/day: 1.00    Pack years: 0.00    Types: Cigarettes   Smokeless tobacco: Never  Vaping Use   Vaping Use: Every day   Substances: CBD  Substance and Sexual Activity   Alcohol use: No   Drug use: Yes    Types: Marijuana   Sexual activity: Never  Other Topics Concern   Not on file  Social History Narrative   Not on file   Social Determinants of Health   Financial Resource Strain: High Risk   Difficulty of Paying Living Expenses: Very hard  Food  Insecurity: Food Insecurity Present   Worried About Running Out of Food in the Last Year: Sometimes true   Ran Out of Food in the Last Year: Patient refused  Transportation Needs: No Transportation Needs   Lack of Transportation (Medical): No   Lack of Transportation (Non-Medical): No  Physical Activity: Sufficiently Active   Days of Exercise per Week: 3 days   Minutes of Exercise per Session: 50 min  Stress: Stress Concern Present   Feeling of Stress : Very much  Social Connections: Unknown   Frequency of Communication with Friends and Family: Three times a week   Frequency of Social Gatherings with Friends and Family: Patient refused   Attends Religious Services: Patient refused   Marine scientist or Organizations: No   Attends Music therapist: Never   Marital Status: Divorced    Family History        Family History  Problem Relation Age of Onset   Ovarian cancer Maternal Aunt        46's   Depression Mother    Heart disease Father    Hypertension Father    Diabetes Father    Post-traumatic stress disorder Sister    Hypertension Sister    Diabetes Brother    Depression Brother    Hypertension Brother     Patient Active Problem List   Diagnosis Date Noted   Polyp of transverse colon    Intractable vomiting    Dysthymia 05/08/2020   SOB (shortness of breath) 05/01/2020  SBO (small bowel obstruction) ([place]) 05/01/2020   Leukocytosis 05/01/2020   Colon cancer screening 03/20/2020   Gastric outlet obstruction 03/20/2020   Chronic obstructive pulmonary disease ([place]) 06/14/2019   Tobacco use 06/14/2019   Traumatic brain injury with loss of consciousness ([place]) 05/08/2019   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   S/P laparoscopic appendectomy 02/15/2019   Nonintractable headache    Hypertension    Chronic neck pain    Vertigo 07/11/2017   Abdominal pain 11/15/2016   FH: colon  polyps 11/15/2016   Elevated alkaline phosphatase level 11/15/2016   BRBPR (bright red blood per rectum) 11/15/2016   Other dysphagia 11/15/2016   Other constipation 11/15/2016   PTSD (post-traumatic stress disorder) 05/18/2016   Breast mass, left 01/08/2016   Anxiety and depression 01/08/2016   Chronic pain 01/08/2016   Seizure disorder ([place]) 01/08/2016   GERD (gastroesophageal reflux disease) 01/08/2016   Difficulty sleeping 08/05/2015   Mood disorder ([place]) 05/05/2015   Tobacco abuse counseling 04/15/2015   Crushing injury of neck 09/27/2011   Head injury 05/06/2011    Past Surgical History:  Procedure Laterality Date   abd pain     APPENDECTOMY     BACK SURGERY     LUMBAR FUSIONS X 2   CATARACT EXTRACTION W/PHACO Right 01/07/2015   Procedure: CATARACT EXTRACTION PHACO AND INTRAOCULAR LENS PLACEMENT ([place]);  Surgeon: Birder Robson, MD;  Location: [facility] ORS;  Service: Ophthalmology;  Laterality: Right;  [place] 0 AP% 0 CDE 0 fluid pack lot #[id number]   CATARACT EXTRACTION W/PHACO Left 01/28/2015   Procedure: CATARACT EXTRACTION PHACO AND INTRAOCULAR LENS PLACEMENT (IOC);  Surgeon: Birder Robson, MD;  Location: [facility] ORS;  Service: Ophthalmology;  Laterality: Left;  [place] 00:18 AP%7.1 CDE1.32 fluid pack lot # [id number]   Cerebral Catheterization for Brain Bleed  02/02/2019   [facility]   COLONOSCOPY WITH PROPOFOL N/A 05/07/2020   Procedure: COLONOSCOPY WITH PROPOFOL;  Surgeon: Lin Landsman, MD;  Location: [facility] ENDOSCOPY;  Service: Gastroenterology;  Laterality: N/A;   COLONOSCOPY WITH PROPOFOL N/A 06/13/2020   Procedure: COLONOSCOPY WITH PROPOFOL;  Surgeon: [name], MD;  Location: [facility] ENDOSCOPY;  Service: Endoscopy;  Laterality: N/A;   ESOPHAGOGASTRODUODENOSCOPY (EGD) WITH PROPOFOL N/A 05/07/2020   Procedure: ESOPHAGOGASTRODUODENOSCOPY (EGD) WITH PROPOFOL;  Surgeon: Lin Landsman, MD;  Location: [facility] ENDOSCOPY;  Service: Gastroenterology;  Laterality: N/A;    ESOPHAGOGASTRODUODENOSCOPY (EGD) WITH PROPOFOL N/A 06/13/2020   Procedure: ESOPHAGOGASTRODUODENOSCOPY (EGD) WITH PROPOFOL;  Surgeon: [name], MD;  Location: [facility] ENDOSCOPY;  Service: Endoscopy;  Laterality: N/A;   LEEP     XI ROBOT ABDOMINAL PERINEAL RESECTION N/A 01/24/2019   Procedure: Robot assisted appendectomy ;  Surgeon: Ronny Bacon, MD;  Location: [facility] ORS;  Service: General;  Laterality: N/A;     Current Outpatient Medications:    albuterol (VENTOLIN HFA) 108 (90 Base) MCG/ACT inhaler, INL 2 PFS PO Q 4 TO 6 H IF NEEDED, Disp: 18 g, Rfl: 3   carisoprodol (SOMA) 350 MG tablet, Take 700 mg by mouth 2 (two) times daily. , Disp: , Rfl:    desvenlafaxine (PRISTIQ) 100 MG 24 hr tablet, Take 1 tablet (100 mg total) by mouth daily., Disp: 30 tablet, Rfl: 3   fexofenadine (ALLEGRA) 180 MG tablet, Take 180 mg by mouth daily. , Disp: , Rfl:    lidocaine (XYLOCAINE) 2 % solution, Use as directed 15 mLs in the mouth or throat every 4 (four) hours as needed  for mouth pain., Disp: 120 mL, Rfl: 3   linaclotide (LINZESS) 290 MCG CAPS capsule, Take 1 capsule (290 mcg total) by mouth daily before breakfast., Disp: 90 capsule, Rfl: 3   lisinopril-hydrochlorothiazide (ZESTORETIC) 10-12.5 MG tablet, TAKE 1 TABLET BY MOUTH EVERY DAY, Disp: 90 tablet, Rfl: 1   oxyCODONE (OXY IR/ROXICODONE) 5 MG immediate release tablet, Take 5 mg by mouth 2 (two) times daily., Disp: , Rfl:    pantoprazole (PROTONIX) 40 MG tablet, Take 1 tablet (40 mg total) by mouth 2 (two) times daily., Disp: 60 tablet, Rfl: 0   sucralfate (CARAFATE) 1 GM/10ML suspension, Take 10 mLs (1 g total) by mouth 4 (four) times daily -  with meals and at bedtime., Disp: 420 mL, Rfl: 0   Tiotropium Bromide Monohydrate (SPIRIVA RESPIMAT) 2.5 MCG/ACT AERS, Inhale 2 puffs into the lungs daily., Disp: 4 g, Rfl: 5   butalbital-acetaminophen-caffeine (FIORICET) 50-325-40 MG tablet, Take 1 tablet by mouth every 6 (six) hours as needed for headache.  (Patient not taking: Reported on 09/08/2020), Disp: 30 tablet, Rfl: 0   Docusate Sodium (DSS) 100 MG CAPS, TK ONE C PO BID PRF MILD CONSTIPATION (Patient not taking: Reported on 09/08/2020), Disp: , Rfl:    EMGALITY 120 MG/ML SOAJ, Inject 1 mL into the muscle every 28 (twenty-eight) days., Disp: , Rfl:   Allergies  Allergen Reactions   Contrast Media [Iodinated Diagnostic Agents]    Diphenhydramine Hcl Hives and Itching   Adhesive [Tape]     PAPER TAPE OK   Iodine Other (See Comments)   Prednisone Anxiety   Sulfa Antibiotics Rash    Patient Care Team: [name], PA-C as PCP - General (Family Medicine) Agapito Games, MD as Referring Physician (Neurosurgery) Carolin Guernsey, PA-C (Neurosurgery)  Review of Systems   I personally reviewed active problem list, medication list, allergies, family history, social history, health maintenance, notes from last encounter, lab results, imaging with the patient/caregiver today.        Objective:   Vitals:  Vitals:   09/08/20 1039  BP: 102/74  Pulse: 91  Resp: 16  Temp: 98.4 F (36.9 C)  SpO2: 96%  Weight: 131 lb 8 oz (59.6 kg)  Height: 5' 8" (1.727 m)    Body mass index is 19.99 kg/m.  Physical Exam Vitals and nursing note reviewed. Exam conducted with a chaperone present.  Constitutional:      General: She is not in acute distress.    Appearance: Normal appearance. She is well-developed and normal weight. She is not ill-appearing, toxic-appearing or diaphoretic.     Interventions: Face mask in place.  HENT:     Head: Normocephalic and atraumatic.     Right Ear: External ear normal.     Left Ear: External ear normal.     Mouth/Throat:     Mouth: Mucous membranes are moist.  Eyes:     General: Lids are normal. No scleral icterus.       Right eye: No discharge.        Left eye: No discharge.     Conjunctiva/sclera: Conjunctivae normal.  Neck:     Trachea: Phonation normal. No tracheal deviation.  Cardiovascular:      Rate and Rhythm: Normal rate and regular rhythm.     Pulses: Normal pulses.          Radial pulses are 2+ on the right side and 2+ on the left side.       Posterior tibial pulses are 2+  on the right side and 2+ on the left side.     Heart sounds: Normal heart sounds. No murmur heard.   No friction rub. No gallop.  Pulmonary:     Effort: Pulmonary effort is normal. No respiratory distress.     Breath sounds: Normal breath sounds. No stridor. No wheezing, rhonchi or rales.  Chest:     Chest wall: No tenderness.  Abdominal:     General: Bowel sounds are normal. There is no distension.     Palpations: Abdomen is soft.     Tenderness: There is abdominal tenderness. There is no right CVA tenderness, left CVA tenderness, guarding or rebound.     Hernia: A hernia (ventral hernia central abd superior to trochar incision scar) is present.  Genitourinary:    General: Normal vulva.     Vagina: Normal.     Cervix: Normal.     Uterus: Normal.      Adnexa: Right adnexa normal and left adnexa normal.     Comments: PAP done Musculoskeletal:     Right lower leg: No edema.     Left lower leg: No edema.  Skin:    General: Skin is warm and dry.     Coloration: Skin is not jaundiced or pale.     Findings: No rash.  Neurological:     Mental Status: She is alert. Mental status is at baseline.     Motor: No abnormal muscle tone.     Gait: Gait normal.  Psychiatric:        Mood and Affect: Mood normal.        Speech: Speech normal.        Behavior: Behavior normal.      Fall Risk: Fall Risk  09/08/2020 06/09/2020 01/17/2020 07/16/2019 06/14/2019  Falls in the past year? _0 0 0  Number falls in past yr: _1 0 0  Injury with Fall? 0 0 0 0 0  Risk for fall due to : - - History of fall(s) - -  Follow up - - Falls evaluation completed Falls evaluation completed -    Functional Status Survey: Is the patient deaf or have difficulty hearing?: No Does the patient have difficulty seeing, even  when wearing glasses/contacts?: No Does the patient have difficulty concentrating, remembering, or making decisions?: Yes Does the patient have difficulty walking or climbing stairs?: Yes Does the patient have difficulty dressing or bathing?: Yes Does the patient have difficulty doing errands alone such as visiting a doctor's office or shopping?: Yes   Assessment & Plan:    CPE completed today  USPSTF grade A and B recommendations reviewed with patient; age-appropriate recommendations, preventive care, screening tests, etc discussed and encouraged; healthy living encouraged; see AVS for patient education given to patient  Discussed importance of 150 minutes of physical activity weekly, AHA exercise recommendations given to pt in AVS/handout  Discussed importance of healthy diet:  eating lean meats and proteins, avoiding trans fats and saturated fats, avoid simple sugars and excessive carbs in diet, eat 6 servings of fruit/vegetables daily and drink plenty of water and avoid sweet beverages.    Recommended pt to do annual eye exam and routine dental exams/cleanings  Depression, alcohol, fall screening completed as documented above and per flowsheets  Reviewed Health Maintenance: Health Maintenance  Topic Date Due   PAP SMEAR-Modifier  Never done   MAMMOGRAM  07/15/2017   COVID-19 Vaccine (1) 09/24/2020 (Originally 04/13/1970)   Zoster Vaccines- Shingrix (1  of 2) 12/09/2020 (Originally 04/13/2017)   Pneumococcal Vaccine 82-87 Years old (1 - PCV) 09/08/2021 (Originally 04/13/1973)   INFLUENZA VACCINE  09/29/2020   COLONOSCOPY (Pts 45-42yr Insurance coverage will need to be confirmed)  06/14/2027   TETANUS/TDAP  09/06/2027   Hepatitis C Screening  Completed   HIV Screening  Completed   HPV VACCINES  Aged Out    Immunizations: Immunization History  Administered Date(s) Administered   H1N1 02/09/2008   Influenza, Seasonal, Injecte, Preservative Fre 12/13/2005, 01/20/2007, 11/20/2007,  12/23/2008, 12/29/2009, 12/16/2010, 12/28/2012   PPD Test 09/20/2006   Tdap 09/05/2017   Well woman completed today with PAP - pt due for mammogram   ICD-10-CM   1. Encounter for well woman exam with routine gynecological exam  Z01.419 Lipid panel    CBC w/Diff/Platelet    COMPLETE METABOLIC PANEL WITH GFR    Cytology - PAP    2. HTN (hypertension), malignant  IZ00COMPLETE METABOLIC PANEL WITH GFR   BP well controlled - can likely decrease or hold meds and monitor    3. Chronic obstructive pulmonary disease, unspecified COPD type ([place]  J44.9 Ambulatory Referral for Lung Cancer Scre    4. Screening for malignant neoplasm of colon  Z12.11 Cytology - PAP    5. 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 Pseudobulbar affect  F48.2 desvenlafaxine (PRISTIQ) 100 MG 24 hr tablet    Ambulatory referral to Psychiatry    7. PTSD (post-traumatic stress disorder)  F43.10 desvenlafaxine (PRISTIQ) 100 MG 24 hr tablet    Ambulatory referral to Psychiatry    8. Mood disorder (HCC)  F39 desvenlafaxine (PRISTIQ) 100 MG 24 hr tablet    Ambulatory referral to Psychiatry    9. Current smoker  F17.200 Ambulatory Referral for Lung Cancer Scre   discussed smoking cessation and lung CA CT screening    10. Smoking greater than 20 pack years  F17.210 Ambulatory Referral for Lung Cancer Scre   [facility]          LDelsa Grana PA-C 09/08/20 11:05 AM  [facility]

## 2020-09-09 LAB — CBC WITH DIFFERENTIAL/PLATELET
Absolute Monocytes: 817 cells/uL (ref 200–950)
Basophils Absolute: 24 cells/uL (ref 0–200)
Basophils Relative: 0.2 %
Eosinophils Absolute: 0 cells/uL — ABNORMAL LOW (ref 15–500)
Eosinophils Relative: 0 %
HCT: 46.5 % — ABNORMAL HIGH (ref 35.0–45.0)
Hemoglobin: 15.9 g/dL — ABNORMAL HIGH (ref 11.7–15.5)
Lymphs Abs: 3172 cells/uL (ref 850–3900)
MCH: 31.5 pg (ref 27.0–33.0)
MCHC: 34.2 g/dL (ref 32.0–36.0)
MCV: 92.3 fL (ref 80.0–100.0)
MPV: 9.6 fL (ref 7.5–12.5)
Monocytes Relative: 6.7 %
Neutro Abs: 8186 cells/uL — ABNORMAL HIGH (ref 1500–7800)
Neutrophils Relative %: 67.1 %
Platelets: 400 10*3/uL (ref 140–400)
RBC: 5.04 10*6/uL (ref 3.80–5.10)
RDW: 14.7 % (ref 11.0–15.0)
Total Lymphocyte: 26 %
WBC: 12.2 10*3/uL — ABNORMAL HIGH (ref 3.8–10.8)

## 2020-09-09 LAB — COMPLETE METABOLIC PANEL WITH GFR
AG Ratio: 1.8 (calc) (ref 1.0–2.5)
ALT: 13 U/L (ref 6–29)
AST: 16 U/L (ref 10–35)
Albumin: 4.8 g/dL (ref 3.6–5.1)
Alkaline phosphatase (APISO): 118 U/L (ref 37–153)
BUN: 16 mg/dL (ref 7–25)
CO2: 31 mmol/L (ref 20–32)
Calcium: 10.2 mg/dL (ref 8.6–10.4)
Chloride: 101 mmol/L (ref 98–110)
Creat: 0.7 mg/dL (ref 0.50–1.03)
Globulin: 2.6 g/dL (calc) (ref 1.9–3.7)
Glucose, Bld: 89 mg/dL (ref 65–99)
Potassium: 4.8 mmol/L (ref 3.5–5.3)
Sodium: 135 mmol/L (ref 135–146)
Total Bilirubin: 0.4 mg/dL (ref 0.2–1.2)
Total Protein: 7.4 g/dL (ref 6.1–8.1)
eGFR: 103 mL/min/{1.73_m2} (ref >=60)

## 2020-09-09 LAB — LIPID PANEL
Cholesterol: 281 mg/dL — ABNORMAL HIGH (ref <200)
HDL: 49 mg/dL — ABNORMAL LOW (ref >=50)
LDL Cholesterol (Calc): 205 mg/dL (calc) — ABNORMAL HIGH
Non-HDL Cholesterol (Calc): 232 mg/dL (calc) — ABNORMAL HIGH (ref <130)
Total CHOL/HDL Ratio: 5.7 (calc) — ABNORMAL HIGH (ref <5.0)
Triglycerides: 126 mg/dL (ref <150)

## 2020-09-15 ENCOUNTER — Telehealth: Payer: Self-pay | Admitting: Family Medicine

## 2020-09-15 DIAGNOSIS — K219 Gastro-esophageal reflux disease without esophagitis: Secondary | ICD-10-CM

## 2020-09-15 LAB — CYTOLOGY - PAP
Chlamydia: NEGATIVE
Comment: NEGATIVE
Comment: NEGATIVE
Comment: NEGATIVE
Comment: NORMAL
Diagnosis: NEGATIVE
High risk HPV: NEGATIVE
Neisseria Gonorrhea: NEGATIVE
Trichomonas: NEGATIVE

## 2020-09-15 NOTE — Telephone Encounter (Signed)
Patient requesting refill for Protonix. Per provider's note on last refill patient is to request refills from GI. TC-left VM to call her GI physician's office and request Protonix refills.

## 2020-09-15 NOTE — Telephone Encounter (Signed)
Medication Refill - Medication: Pantoprazole   Has the patient contacted their pharmacy? Yes.   Pt states that the pharmacy does not have refills and that she is completely out of medication. Please advise.  (Agent: If no, request that the patient contact the pharmacy for the refill.) (Agent: If yes, when and what did the pharmacy advise?)  Preferred Pharmacy (with phone number or street name):  [id number] - [place], [address]  [address] [place] [address]  Phone: [phone] Fax: [phone]  Hours: Not open 24 hours    Agent: Please be advised that RX refills may take up to 3 business days. We ask that you follow-up with your pharmacy.

## 2020-09-16 ENCOUNTER — Other Ambulatory Visit: Payer: Self-pay

## 2020-09-16 DIAGNOSIS — K219 Gastro-esophageal reflux disease without esophagitis: Secondary | ICD-10-CM

## 2020-09-16 NOTE — Telephone Encounter (Signed)
Pt would like refills, states just saw you and you intial prescribed she should not have to go to GI for this.  She is going to be very upset if you do not fill

## 2020-09-17 ENCOUNTER — Other Ambulatory Visit: Payer: Self-pay | Admitting: *Deleted

## 2020-09-17 DIAGNOSIS — F17210 Nicotine dependence, cigarettes, uncomplicated: Secondary | ICD-10-CM

## 2020-09-17 DIAGNOSIS — Z87891 Personal history of nicotine dependence: Secondary | ICD-10-CM

## 2020-09-22 ENCOUNTER — Encounter: Payer: Self-pay | Admitting: *Deleted

## 2020-09-22 ENCOUNTER — Ambulatory Visit: Payer: 59 | Admitting: Gastroenterology

## 2020-10-07 ENCOUNTER — Telehealth: Payer: Self-pay | Admitting: Acute Care

## 2020-10-07 ENCOUNTER — Telehealth: Payer: Self-pay | Admitting: Family Medicine

## 2020-10-07 NOTE — Telephone Encounter (Signed)
LMTCB

## 2020-10-07 NOTE — Telephone Encounter (Signed)
Copied from [place] [id number]. Topic: Medicare AWV >> Oct 07, 2020 11:11 AM Cher Nakai R wrote: Reason for CRM:  Left message for patient to call back and schedule Medicare Annual Wellness Visit (AWV) in office.   If unable to come into the office for AWV,  please offer to do virtually or by telephone.  Last AWV: 04/30/2019  Please schedule at anytime with [place].  40 minute appointment  Any questions, please contact me at [phone]

## 2020-10-08 ENCOUNTER — Ambulatory Visit: Admission: RE | Admit: 2020-10-08 | Payer: 59 | Source: Ambulatory Visit

## 2020-10-08 ENCOUNTER — Telehealth: Payer: 59 | Admitting: Acute Care

## 2020-10-16 ENCOUNTER — Telehealth: Payer: Self-pay | Admitting: Family Medicine

## 2020-10-16 NOTE — Telephone Encounter (Signed)
Copied from [place] [id number]. Topic: Medicare AWV >> Oct 16, 2020  2:16 PM Cher Nakai R wrote: Reason for CRM:  Left message for patient to call back and schedule Medicare Annual Wellness Visit (AWV) in office.   If unable to come into the office for AWV,  please offer to do virtually or by telephone.  Last AWV:  04/29/2018 per [place]   Please schedule at anytime with [place].  40 minute appointment  Any questions, please contact me at [phone]

## 2020-11-08 ENCOUNTER — Other Ambulatory Visit: Payer: Self-pay | Admitting: Family Medicine

## 2020-11-08 DIAGNOSIS — F0630 Mood disorder due to known physiological condition, unspecified: Secondary | ICD-10-CM

## 2020-11-08 DIAGNOSIS — F4310 Post-traumatic stress disorder, unspecified: Secondary | ICD-10-CM

## 2020-11-08 DIAGNOSIS — F39 Unspecified mood [affective] disorder: Secondary | ICD-10-CM

## 2020-11-08 DIAGNOSIS — F482 Pseudobulbar affect: Secondary | ICD-10-CM

## 2020-12-11 ENCOUNTER — Other Ambulatory Visit: Payer: Self-pay | Admitting: Family Medicine

## 2020-12-11 DIAGNOSIS — J449 Chronic obstructive pulmonary disease, unspecified: Secondary | ICD-10-CM

## 2020-12-11 NOTE — Telephone Encounter (Signed)
Requested Prescriptions  Pending Prescriptions Disp Refills  . [place] 2.5 MCG/ACT AERS [Pharmacy Med Name: SPIRIVA RESPIMAT 2.5 MCG INH] 4 g 5    Sig: INHALE 2 PUFFS BY MOUTH INTO THE LUNGS DAILY     Pulmonology:  Anticholinergic Agents Passed - 12/11/2020  3:45 AM      Passed - Valid encounter within last 12 months    Recent Outpatient Visits          3 months ago Encounter for well woman exam with routine gynecological exam   [facility] [name], PA-C   6 months ago Chronic obstructive pulmonary disease, unspecified COPD type [facility])   [facility] [name], PA-C   10 months ago Pain of upper abdomen   [facility] [name], PA-C   1 year ago Hypertension, unspecified type   [facility] [name], PA-C   1 year ago HTN (hypertension), malignant   [facility] [name], [place]

## 2021-01-10 ENCOUNTER — Other Ambulatory Visit: Payer: Self-pay | Admitting: Family Medicine

## 2021-01-10 DIAGNOSIS — I10 Essential (primary) hypertension: Secondary | ICD-10-CM

## 2021-01-11 NOTE — Telephone Encounter (Signed)
Requested Prescriptions  Pending Prescriptions Disp Refills  . lisinopril-hydrochlorothiazide (ZESTORETIC) 10-12.5 MG tablet [Pharmacy Med Name: LISINOPRIL-HCTZ 10-12.5 MG TAB] 90 tablet 1    Sig: TAKE 1 TABLET BY MOUTH EVERY DAY     Cardiovascular:  ACEI + Diuretic Combos Passed - 01/10/2021  4:29 PM      Passed - Na in normal range and within 180 days    Sodium  Date Value Ref Range Status  09/08/2020 135 135 - 146 mmol/L Final  03/20/2020 143 134 - 144 mmol/L Final  12/17/2013 138 136 - 145 mmol/L Final         Passed - K in normal range and within 180 days    Potassium  Date Value Ref Range Status  09/08/2020 4.8 3.5 - 5.3 mmol/L Final  12/17/2013 4.3 3.5 - 5.1 mmol/L Final         Passed - Cr in normal range and within 180 days    Creat  Date Value Ref Range Status  09/08/2020 0.70 0.50 - 1.03 mg/dL Final         Passed - Ca in normal range and within 180 days    Calcium  Date Value Ref Range Status  09/08/2020 10.2 8.6 - 10.4 mg/dL Final   Calcium, Total  Date Value Ref Range Status  12/17/2013 9.3 8.5 - 10.1 mg/dL Final         Passed - Patient is not pregnant      Passed - Last BP in normal range    BP Readings from Last 1 Encounters:  09/08/20 102/74         Passed - Valid encounter within last 6 months    Recent Outpatient Visits          4 months ago Encounter for well woman exam with routine gynecological exam   [facility] [name], PA-C   7 months ago Chronic obstructive pulmonary disease, unspecified COPD type [facility])   [facility] [name], PA-C   12 months ago Pain of upper abdomen   [facility] [name], PA-C   1 year ago Hypertension, unspecified type   [facility] [name], PA-C   1 year ago HTN (hypertension), malignant   [facility] [name], [place]

## 2021-05-11 ENCOUNTER — Other Ambulatory Visit: Payer: Self-pay | Admitting: Gastroenterology

## 2021-09-01 ENCOUNTER — Other Ambulatory Visit: Payer: Self-pay | Admitting: Family Medicine

## 2021-09-01 DIAGNOSIS — J449 Chronic obstructive pulmonary disease, unspecified: Secondary | ICD-10-CM

## 2021-09-02 ENCOUNTER — Other Ambulatory Visit: Payer: Self-pay

## 2021-09-03 ENCOUNTER — Telehealth: Payer: Self-pay

## 2021-09-03 NOTE — Telephone Encounter (Signed)
Returned pt call to inform her no documentation on who called her.     Copied from [place] [phone]. Topic: General - Other >> Sep 03, 2021  2:15 PM [place] B wrote: Reason for CRM: Patient returning call, not sure what it was for. Please call back

## 2021-10-06 ENCOUNTER — Other Ambulatory Visit: Payer: Self-pay

## 2021-10-06 NOTE — Progress Notes (Signed)
Attempted to contact the patient on her home phone and cellphone several times for her AWV appointment, no answer. I left a message on the patient voicemail.

## 2021-11-28 ENCOUNTER — Other Ambulatory Visit: Payer: Self-pay | Admitting: Family Medicine

## 2021-11-28 DIAGNOSIS — I10 Essential (primary) hypertension: Secondary | ICD-10-CM

## 2021-11-30 NOTE — Telephone Encounter (Signed)
Requested medications are due for refill today.  Yes  Requested medications are on the active medications list.  yes  Last refill. 01/11/2021 #90 1 rf  Future visit scheduled.   no  Notes to clinic.  Pt is more than 3 months overdue for OV. Labs are expired.    Requested Prescriptions  Pending Prescriptions Disp Refills   lisinopril-hydrochlorothiazide (ZESTORETIC) 10-12.5 MG tablet [Pharmacy Med Name: LISINOPRIL-HCTZ 10-12.5 MG TAB] 90 tablet 1    Sig: TAKE 1 TABLET BY MOUTH EVERY DAY     Cardiovascular:  ACEI + Diuretic Combos Failed - 11/28/2021  9:02 AM      Failed - Na in normal range and within 180 days    Sodium  Date Value Ref Range Status  09/08/2020 135 135 - 146 mmol/L Final  03/20/2020 143 134 - 144 mmol/L Final  12/17/2013 138 136 - 145 mmol/L Final         Failed - K in normal range and within 180 days    Potassium  Date Value Ref Range Status  09/08/2020 4.8 3.5 - 5.3 mmol/L Final  12/17/2013 4.3 3.5 - 5.1 mmol/L Final         Failed - Cr in normal range and within 180 days    Creat  Date Value Ref Range Status  09/08/2020 0.70 0.50 - 1.03 mg/dL Final         Failed - eGFR is 30 or above and within 180 days    GFR, Est African American  Date Value Ref Range Status  01/17/2020 98 > OR = 60 mL/min/1.72m2 Final   GFR calc Af Amer  Date Value Ref Range Status  03/20/2020 79 >59 mL/min/1.73 Final    Comment:    **In accordance with recommendations from the NKF-ASN Task force,**   [facility] is in the process of updating its eGFR calculation to the   2021 CKD-EPI creatinine equation that estimates kidney function   without a race variable.    GFR, Est Non African American  Date Value Ref Range Status  01/17/2020 85 > OR = 60 mL/min/1.62m2 Final   GFR, Estimated  Date Value Ref Range Status  05/05/2020 >60 >60 mL/min Final    Comment:    (NOTE) Calculated using the CKD-EPI Creatinine Equation (2021)    eGFR  Date Value Ref Range Status   09/08/2020 103 > OR = 60 mL/min/1.17m2 Final    Comment:    The eGFR is based on the CKD-EPI 2021 equation. To calculate  the new eGFR from a previous Creatinine or Cystatin C result, go to https://www.kidney.org/professionals/ kdoqi/gfr%5Fcalculator          Failed - Valid encounter within last 6 months    Recent Outpatient Visits           1 year ago Encounter for well woman exam with routine gynecological exam   [facility] [name], PA-C   1 year ago Chronic obstructive pulmonary disease, unspecified COPD type [facility])   [facility] [name], PA-C   1 year ago Pain of upper abdomen   [facility] [name], PA-C   2 years ago Hypertension, unspecified type   [facility] [name], PA-C   2 years ago HTN (hypertension), malignant   [facility] [name], [place]              Passed - Patient is not pregnant      Passed -  Last BP in normal range    BP Readings from Last 1 Encounters:  09/08/20 102/74

## 2021-12-09 ENCOUNTER — Other Ambulatory Visit: Payer: Self-pay | Admitting: Family Medicine

## 2021-12-09 ENCOUNTER — Encounter: Payer: Self-pay | Admitting: Family Medicine

## 2021-12-09 ENCOUNTER — Ambulatory Visit: Payer: Self-pay | Admitting: *Deleted

## 2021-12-09 DIAGNOSIS — F482 Pseudobulbar affect: Secondary | ICD-10-CM

## 2021-12-09 DIAGNOSIS — E782 Mixed hyperlipidemia: Secondary | ICD-10-CM | POA: Insufficient documentation

## 2021-12-09 DIAGNOSIS — F4310 Post-traumatic stress disorder, unspecified: Secondary | ICD-10-CM

## 2021-12-09 DIAGNOSIS — I10 Essential (primary) hypertension: Secondary | ICD-10-CM

## 2021-12-09 DIAGNOSIS — F0630 Mood disorder due to known physiological condition, unspecified: Secondary | ICD-10-CM

## 2021-12-09 DIAGNOSIS — F39 Unspecified mood [affective] disorder: Secondary | ICD-10-CM

## 2021-12-09 NOTE — Telephone Encounter (Signed)
Summary: discuss medication   Pt states she had an appt and was told someone would call her to discuss her medications, pt states she then received a vm to call the office to discuss medications   I was unable to locate documentation   Please assist further     Per notes on RF requests- needs appointment Attempted to call patient- left message to call office

## 2021-12-09 NOTE — Progress Notes (Signed)
Name: [name]   MRN: [id number]    DOB: 28-Mar-1965   Date:12/10/2021       Progress Note  Chief Complaint  Patient presents with   Follow-up   Hypertension   Hyperlipidemia   COPD     Subjective:   [name] is a 56 y.o. female, presents to clinic for f/up, last seen in office 08/2020.  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  Controlled substance database does show continued refills for oxycodone and soma monthly w/o missed doses - managed by - Rae Roam - records not available  Mammogram abnormal     Current Outpatient Medications:    carisoprodol (SOMA) 350 MG tablet, Take 700 mg by mouth 2 (two) times daily., Disp: , Rfl:    LINZESS 290 MCG CAPS capsule, TAKE 1 CAPSULE BY MOUTH DAILY BEFORE BREAKFAST., Disp: 90 capsule, Rfl: 3   lisinopril (ZESTRIL) 10 MG tablet, Take 1 tablet (10 mg total) by mouth daily., Disp: 90 tablet, Rfl: 3   oxyCODONE (OXY IR/ROXICODONE) 5 MG immediate release tablet, Take 5 mg by mouth 2 (two) times daily., Disp: , Rfl:    albuterol (VENTOLIN HFA) 108 (90 Base) MCG/ACT inhaler, INL 2 PFS PO Q 4 TO 6 H IF NEEDED, Disp: 18 g, Rfl: 3   desvenlafaxine (PRISTIQ) 100 MG 24 hr  tablet, Take 1 tablet (100 mg total) by mouth daily., Disp: 90 tablet, Rfl: 3   fexofenadine (ALLEGRA) 180 MG tablet, Take 180 mg by mouth daily.  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[place]) 05/08/2019   Intractable 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 without status migrainosus 04/06/2019   Hypertension    Chronic neck pain    FH: colon polyps 11/15/2016   Other dysphagia 11/15/2016   Other constipation 11/15/2016   PTSD (post-traumatic stress disorder) 05/18/2016   Breast mass, left 01/08/2016   Chronic pain 01/08/2016   Seizure disorder ([place]) 01/08/2016   GERD (gastroesophageal reflux disease) 01/08/2016   Difficulty sleeping 08/05/2015    Past Surgical History:  Procedure Laterality Date   abd pain     APPENDECTOMY     BACK SURGERY     LUMBAR FUSIONS X 2   CATARACT EXTRACTION W/PHACO Right 01/07/2015   Procedure: CATARACT EXTRACTION PHACO AND INTRAOCULAR LENS PLACEMENT ([place]);  Surgeon:  Birder Robson, MD;  Location: [facility] ORS;  Service: Ophthalmology;  Laterality: Right;  [place] 0 AP% 0 CDE 0 fluid pack lot #[id number]   CATARACT EXTRACTION W/PHACO Left 01/28/2015   Procedure: CATARACT EXTRACTION PHACO AND INTRAOCULAR LENS PLACEMENT (I[facility]);  Surgeon: Birder Robson, MD;  Location: [facility] ORS;  Service: Ophthalmology;  Laterality: Left;  [place] 00:18 AP%7.1 CDE1.32 fluid pack lot # [id number]   Cerebral Catheterization for Brain Bleed  02/02/2019   [facility]   COLONOSCOPY WITH PROPOFOL N/A 05/07/2020   Procedure: COLONOSCOPY WITH PROPOFOL;  Surgeon: Lin Landsman,  MD;  Location: [facility] ENDOSCOPY;  Service: Gastroenterology;  Laterality: N/A;   COLONOSCOPY WITH PROPOFOL N/A 06/13/2020   Procedure: COLONOSCOPY WITH PROPOFOL;  Surgeon: [name], MD;  Location: [facility] ENDOSCOPY;  Service: Endoscopy;  Laterality: N/A;   ESOPHAGOGASTRODUODENOSCOPY (EGD) WITH PROPOFOL N/A 05/07/2020   Procedure: ESOPHAGOGASTRODUODENOSCOPY (EGD) WITH PROPOFOL;  Surgeon: Lin Landsman, MD;  Location: [facility] ENDOSCOPY;  Service: Gastroenterology;  Laterality: N/A;   ESOPHAGOGASTRODUODENOSCOPY (EGD) WITH PROPOFOL N/A 06/13/2020   Procedure: ESOPHAGOGASTRODUODENOSCOPY (EGD) WITH PROPOFOL;  Surgeon: [name], MD;  Location: [facility] ENDOSCOPY;  Service: Endoscopy;  Laterality: N/A;   LEEP     XI ROBOT ABDOMINAL PERINEAL RESECTION N/A 01/24/2019   Procedure: Robot assisted appendectomy ;  Surgeon: Ronny Bacon, MD;  Location: [facility] ORS;  Service: General;  Laterality: N/A;    Family History  Problem Relation Age of Onset   Ovarian cancer Maternal Aunt        40's   Depression Mother    Heart disease Father    Hypertension Father    Diabetes Father    Post-traumatic stress disorder Sister    Hypertension Sister    Diabetes Brother    Depression Brother    Hypertension Brother     Social History   Tobacco Use   Smoking status: Every Day    Packs/day: 1.00    Types: Cigarettes   Smokeless tobacco: Never  Vaping Use   Vaping Use: Every day   Substances: CBD  Substance Use Topics   Alcohol use: No   Drug use: Yes    Types: Marijuana     Allergies  Allergen Reactions   Contrast Media [Iodinated Contrast Media]    Diphenhydramine Hcl Hives and Itching   Adhesive [Tape]     PAPER TAPE OK   Iodine Other (See Comments)   Prednisone Anxiety   Sulfa Antibiotics Rash    Health Maintenance  Topic Date Due   MAMMOGRAM  10/07/2022 (Originally 07/15/2017)   PAP SMEAR-Modifier  09/09/2023   COLONOSCOPY (Pts 45-59yr Insurance coverage will need to be confirmed)   06/14/2027   TETANUS/TDAP  09/06/2027   Hepatitis C Screening  Completed   HIV Screening  Completed   HPV VACCINES  Aged Out   INFLUENZA VACCINE  Discontinued   COVID-19 Vaccine  Discontinued   Zoster Vaccines- Shingrix  Discontinued    Chart Review Today: I personally reviewed active problem list, medication list, allergies, family history, social history, health maintenance, notes from last encounter, lab results, imaging with the patient/caregiver today.   Review of Systems  Constitutional: Negative.   HENT: Negative.    Eyes: Negative.   Respiratory: Negative.    Cardiovascular: Negative.   Gastrointestinal: Negative.   Endocrine: Negative.   Genitourinary: Negative.   Musculoskeletal: Negative.   Skin: Negative.   Allergic/Immunologic: Negative.   Neurological: Negative.   Hematological: Negative.   Psychiatric/Behavioral: Negative.    All other systems  reviewed and are negative.    Objective:   Vitals:   12/10/21 0848 12/10/21 0919  BP: 138/80 132/76  Pulse: 75   Resp: 16   Temp: 98.2 F (36.8 C)   TempSrc: Oral   SpO2: 99%   Height: '5\' 8"'$  (1.727 m)     Body mass index is 18.85 kg/m.  Physical Exam Vitals and nursing note reviewed.  Constitutional:      General: She is not in acute distress.    Appearance: Normal appearance. She is well-developed and underweight. She is not ill-appearing, toxic-appearing or diaphoretic.     Interventions: Face mask in place.  HENT:     Head: Normocephalic and atraumatic.     Right Ear: External ear normal.     Left Ear: External ear normal.  Eyes:     General: Lids are normal. No scleral icterus.       Right eye: No discharge.        Left eye: No discharge.     Conjunctiva/sclera: Conjunctivae normal.  Neck:     Trachea: Phonation normal. No tracheal deviation.  Cardiovascular:     Rate and Rhythm: Normal rate and regular rhythm.     Pulses: Normal pulses.          Radial pulses are 2+ on the right side and  2+ on the left side.       Posterior tibial pulses are 2+ on the right side and 2+ on the left side.     Heart sounds: Normal heart sounds. No murmur heard.    No friction rub. No gallop.  Pulmonary:     Effort: Pulmonary effort is normal. No respiratory distress.     Breath sounds: Normal breath sounds. No stridor. No wheezing, rhonchi or rales.  Chest:     Chest wall: No tenderness.  Abdominal:     General: Bowel sounds are normal. There is no distension.     Palpations: Abdomen is soft.  Musculoskeletal:     Right lower leg: No edema.     Left lower leg: No edema.  Skin:    General: Skin is warm and dry.     Coloration: Skin is not jaundiced or pale.     Findings: No rash.  Neurological:     Mental Status: She is alert.     Motor: No abnormal muscle tone.     Gait: Gait normal.  Psychiatric:        Mood and Affect: Mood normal.        Speech: Speech normal.        Behavior: Behavior normal. Behavior is cooperative.         Assessment & Plan:   Problem List Items Addressed This Visit       Cardiovascular and Mediastinum   Hypertension - Primary    BP mildly elevated today, previously meds had to be reduced due to near syncope and hypotension She does have BP often elevated with anxiety or when meeting new people BP Readings from Last 3 Encounters:  12/10/21 132/76  09/08/20 102/74  07/17/20 103/71  we will keep meds the same due to past low BP episode and prior TBI Her mood disorder and pseudobulbar affect also affects her moods as well as chronic pain so we often have variable blood pressure but today overall is near goal we will continue with the same treatment       Relevant Medications   lisinopril (ZESTRIL) 10 MG tablet  Other Relevant Orders   COMPLETE METABOLIC PANEL WITH GFR (Completed)     Respiratory   Chronic obstructive pulmonary disease ([place])    Patient is not using the albuterol inhaler and denies any exertional shortness of breath, wheeze  she occasionally has cough, declines lung cancer screening      Relevant Medications   albuterol (VENTOLIN HFA) 108 (90 Base) MCG/ACT inhaler   Other Relevant Orders   CBC with Differential/Platelet (Completed)   COMPLETE METABOLIC PANEL WITH GFR (Completed)     Digestive   Gastric outlet obstruction    Chronic abdominal pain, bloating, delayed GI motility and chronic constipation she does see GI, on Linzess daily        Other   Chronic pain    Chronic headaches, neck pain and other chronic pain is currently managed by pain management, stable prescribing and med fills, but her pain management has helped her moods and blood pressures      Relevant Medications   desvenlafaxine (PRISTIQ) 100 MG 24 hr tablet   PTSD (post-traumatic stress disorder)   Relevant Medications   desvenlafaxine (PRISTIQ) 100 MG 24 hr tablet   Pseudobulbar affect   Relevant Medications   desvenlafaxine (PRISTIQ) 100 MG 24 hr tablet   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 [facility])    Patient appears baseline today she presents by herself Mood, behavior and outburst have been fairly stable on Pristiq and with improved pain management, she is not seeing psychiatry still Meds refilled, PHQ-9 reviewed    12/10/2021    8:44 AM 10/06/2021    9:03 AM 09/08/2020   10:43 AM  Depression screen PHQ 2/9  Decreased Interest '2 1 2  '$ Down, Depressed, Hopeless '2 1 3  '$ PHQ - 2 Score '4 2 5  '$ Altered sleeping 2 0 3  Tired, decreased energy '2 1 3  '$ Change in appetite 2 0 2  Feeling bad or failure about yourself  0 0 2  Trouble concentrating 0 0 1  Moving slowly or fidgety/restless 0 0 1  Suicidal thoughts 0  0  PHQ-9 Score '10 3 17  '$ Difficult doing work/chores Somewhat difficult Not difficult at all Somewhat difficult   Score is positive, she denies SI HI AVH       Relevant Medications   desvenlafaxine (PRISTIQ) 100 MG 24 hr tablet   Tobacco use    Smoking cessation and resourced discussed and offered,  she is not currently interested      Mixed hyperlipidemia    She is not on statin medications, recheck labs today, and recalculate ASCVD risk score      Relevant Medications   albuterol (VENTOLIN HFA) 108 (90 Base) MCG/ACT inhaler   lisinopril (ZESTRIL) 10 MG tablet   Other Relevant Orders   COMPLETE METABOLIC PANEL WITH GFR (Completed)   Lipid panel (Completed)   Other Visit Diagnoses     Intractable 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 with status migrainosus       Most of symptoms are managed with pain management, no longer seeing neuro   Relevant Medications   desvenlafaxine (PRISTIQ) 100 MG 24 hr tablet   lisinopril (ZESTRIL) 10 MG tablet   Encounter for screening mammogram for malignant neoplasm of breast       Last mammogram was abnormal in 2018, we called [facility] and she can restart with a screening mammo   Relevant Orders   MM 3D SCREEN BREAST BILATERAL   Need for influenza vaccination  She refuses   Need for shingles vaccine       Refuses   Opioid use, unspecified, uncomplicated       Controlled substance database reviewed, she is seeing local pain management   Encounter for medication monitoring       Relevant Orders   CBC with Differential/Platelet (Completed)   COMPLETE METABOLIC PANEL WITH GFR (Completed)   Lipid panel (Completed)        Return for 2-3 week f/up for BP recheck (telephone or virtual ok with BP readings).   [name], PA-C 12/10/21 9:03 AM

## 2021-12-09 NOTE — Telephone Encounter (Signed)
Chief Complaint: Returning call about AWV, medication appt Symptoms: N/A Frequency: N/A Pertinent Negatives: Patient denies N/A Disposition: '[]'$ ED /'[]'$ Urgent Care (no appt availability in office) / '[]'$ Appointment(In office/virtual)/ '[]'$  [place] [facility]/ '[]'$ Home Care/ '[]'$ Refused Recommended Disposition /'[]'$ [place] [facility]/ '[x]'$  Follow-up with PCP Additional Notes: Patient says she already spoke to someone this morning and scheduled OV for tomorrow with Kristeen Miss, since she needed the appt to discuss her medications.    Summary: discuss medication   Pt states she had an appt and was told someone would call her to discuss her medications, pt states she then received a vm to call the office to discuss medications   I was unable to locate documentation   Please assist further      Reason for Disposition  [1] Follow-up call to recent contact AND [2] information only call, no triage required  Answer Assessment - Initial Assessment Questions 1. REASON FOR CALL or QUESTION: "What is your reason for calling today?" or "How can I best help you?" or "What question do you have that I can help answer?"     Message left to call about AWV/medication check  Protocols used: Information Only Call - No Triage-A-AH

## 2021-12-09 NOTE — Telephone Encounter (Signed)
Pt needs an appt for any refills

## 2021-12-10 ENCOUNTER — Ambulatory Visit (INDEPENDENT_AMBULATORY_CARE_PROVIDER_SITE_OTHER): Payer: 59 | Admitting: Family Medicine

## 2021-12-10 ENCOUNTER — Encounter: Payer: Self-pay | Admitting: Family Medicine

## 2021-12-10 VITALS — BP 132/76 | HR 75 | Temp 98.2°F | Resp 16 | Ht 68.0 in

## 2021-12-10 DIAGNOSIS — Z1231 Encounter for screening mammogram for malignant neoplasm of breast: Secondary | ICD-10-CM

## 2021-12-10 DIAGNOSIS — E782 Mixed hyperlipidemia: Secondary | ICD-10-CM

## 2021-12-10 DIAGNOSIS — Z23 Encounter for immunization: Secondary | ICD-10-CM

## 2021-12-10 DIAGNOSIS — F0630 Mood disorder due to known physiological condition, unspecified: Secondary | ICD-10-CM | POA: Diagnosis not present

## 2021-12-10 DIAGNOSIS — F1190 Opioid use, unspecified, uncomplicated: Secondary | ICD-10-CM

## 2021-12-10 DIAGNOSIS — K311 Adult hypertrophic pyloric stenosis: Secondary | ICD-10-CM

## 2021-12-10 DIAGNOSIS — F482 Pseudobulbar affect: Secondary | ICD-10-CM

## 2021-12-10 DIAGNOSIS — F4310 Post-traumatic stress disorder, unspecified: Secondary | ICD-10-CM

## 2021-12-10 DIAGNOSIS — Z5181 Encounter for therapeutic drug level monitoring: Secondary | ICD-10-CM

## 2021-12-10 DIAGNOSIS — I10 Essential (primary) hypertension: Secondary | ICD-10-CM

## 2021-12-10 DIAGNOSIS — Z720 Tobacco use: Secondary | ICD-10-CM

## 2021-12-10 DIAGNOSIS — G8921 Chronic pain due to trauma: Secondary | ICD-10-CM

## 2021-12-10 DIAGNOSIS — S069XAS Unspecified intracranial injury with loss of consciousness status unknown, sequela: Secondary | ICD-10-CM

## 2021-12-10 DIAGNOSIS — J449 Chronic obstructive pulmonary disease, unspecified: Secondary | ICD-10-CM

## 2021-12-10 DIAGNOSIS — G43711 Chronic migraine without aura, intractable, with status migrainosus: Secondary | ICD-10-CM

## 2021-12-10 MED ORDER — ALBUTEROL SULFATE HFA 108 (90 BASE) MCG/ACT IN AERS: INHALATION_SPRAY | RESPIRATORY_TRACT | 3 refills | Status: DC

## 2021-12-10 MED ORDER — LISINOPRIL 10 MG PO TABS: 10.0000 mg | ORAL_TABLET | Freq: Every day | ORAL | 3 refills | Status: DC

## 2021-12-10 MED ORDER — DESVENLAFAXINE SUCCINATE ER 100 MG PO TB24: 100.0000 mg | ORAL_TABLET | Freq: Every day | ORAL | 3 refills | Status: DC

## 2021-12-11 LAB — COMPLETE METABOLIC PANEL WITH GFR
AG Ratio: 2 (calc) (ref 1.0–2.5)
ALT: 16 U/L (ref 6–29)
AST: 19 U/L (ref 10–35)
Albumin: 4.9 g/dL (ref 3.6–5.1)
Alkaline phosphatase (APISO): 110 U/L (ref 37–153)
BUN: 19 mg/dL (ref 7–25)
CO2: 28 mmol/L (ref 20–32)
Calcium: 10.2 mg/dL (ref 8.6–10.4)
Chloride: 103 mmol/L (ref 98–110)
Creat: 0.74 mg/dL (ref 0.50–1.03)
Globulin: 2.5 g/dL (calc) (ref 1.9–3.7)
Glucose, Bld: 94 mg/dL (ref 65–139)
Potassium: 5.3 mmol/L (ref 3.5–5.3)
Sodium: 140 mmol/L (ref 135–146)
Total Bilirubin: 0.3 mg/dL (ref 0.2–1.2)
Total Protein: 7.4 g/dL (ref 6.1–8.1)
eGFR: 96 mL/min/{1.73_m2} (ref >=60)

## 2021-12-11 LAB — CBC WITH DIFFERENTIAL/PLATELET
Absolute Monocytes: 685 cells/uL (ref 200–950)
Basophils Absolute: 21 cells/uL (ref 0–200)
Basophils Relative: 0.2 %
Eosinophils Absolute: 43 cells/uL (ref 15–500)
Eosinophils Relative: 0.4 %
HCT: 47.3 % — ABNORMAL HIGH (ref 35.0–45.0)
Hemoglobin: 16 g/dL — ABNORMAL HIGH (ref 11.7–15.5)
Lymphs Abs: 3381 cells/uL (ref 850–3900)
MCH: 30.9 pg (ref 27.0–33.0)
MCHC: 33.8 g/dL (ref 32.0–36.0)
MCV: 91.3 fL (ref 80.0–100.0)
MPV: 10.6 fL (ref 7.5–12.5)
Monocytes Relative: 6.4 %
Neutro Abs: 6570 cells/uL (ref 1500–7800)
Neutrophils Relative %: 61.4 %
Platelets: 337 10*3/uL (ref 140–400)
RBC: 5.18 10*6/uL — ABNORMAL HIGH (ref 3.80–5.10)
RDW: 13.7 % (ref 11.0–15.0)
Total Lymphocyte: 31.6 %
WBC: 10.7 10*3/uL (ref 3.8–10.8)

## 2021-12-11 LAB — LIPID PANEL
Cholesterol: 278 mg/dL — ABNORMAL HIGH (ref <200)
HDL: 78 mg/dL (ref >=50)
LDL Cholesterol (Calc): 178 mg/dL (calc) — ABNORMAL HIGH
Non-HDL Cholesterol (Calc): 200 mg/dL (calc) — ABNORMAL HIGH (ref <130)
Total CHOL/HDL Ratio: 3.6 (calc) (ref <5.0)
Triglycerides: 100 mg/dL (ref <150)

## 2021-12-14 NOTE — Assessment & Plan Note (Signed)
Chronic headaches, neck pain and other chronic pain is currently managed by pain management, stable prescribing and med fills, but her pain management has helped her moods and blood pressures

## 2021-12-14 NOTE — Assessment & Plan Note (Signed)
She is not on statin medications, recheck labs today, and recalculate ASCVD risk score

## 2021-12-14 NOTE — Assessment & Plan Note (Signed)
Smoking cessation and resourced discussed and offered, she is not currently interested

## 2021-12-14 NOTE — Assessment & Plan Note (Signed)
Patient appears baseline today she presents by herself Mood, behavior and outburst have been fairly stable on Pristiq and with improved pain management, she is not seeing psychiatry still Meds refilled, PHQ-9 reviewed    12/10/2021    8:44 AM 10/06/2021    9:03 AM 09/08/2020   10:43 AM  Depression screen PHQ 2/9  Decreased Interest '2 1 2  '$ Down, Depressed, Hopeless '2 1 3  '$ PHQ - 2 Score '4 2 5  '$ Altered sleeping 2 0 3  Tired, decreased energy '2 1 3  '$ Change in appetite 2 0 2  Feeling bad or failure about yourself  0 0 2  Trouble concentrating 0 0 1  Moving slowly or fidgety/restless 0 0 1  Suicidal thoughts 0  0  PHQ-9 Score '10 3 17  '$ Difficult doing work/chores Somewhat difficult Not difficult at all Somewhat difficult   Score is positive, she denies SI HI AVH

## 2021-12-14 NOTE — Assessment & Plan Note (Addendum)
BP mildly elevated today, previously meds had to be reduced due to near syncope and hypotension She does have BP often elevated with anxiety or when meeting new people BP Readings from Last 3 Encounters:  12/10/21 132/76  09/08/20 102/74  07/17/20 103/71  we will keep meds the same due to past low BP episode and prior TBI Her mood disorder and pseudobulbar affect also affects her moods as well as chronic pain so we often have variable blood pressure but today overall is near goal we will continue with the same treatment

## 2021-12-14 NOTE — Assessment & Plan Note (Signed)
Chronic abdominal pain, bloating, delayed GI motility and chronic constipation she does see GI, on Linzess daily

## 2021-12-14 NOTE — Assessment & Plan Note (Signed)
Patient is not using the albuterol inhaler and denies any exertional shortness of breath, wheeze she occasionally has cough, declines lung cancer screening

## 2021-12-15 ENCOUNTER — Ambulatory Visit: Payer: Self-pay

## 2021-12-15 NOTE — Telephone Encounter (Signed)
  Chief Complaint: HTN Symptoms: BP 180/106, L arm pain  Frequency: 5 days  Pertinent Negatives: denies chest pain or SOB Disposition: '[]'$ ED /'[]'$ Urgent Care (no appt availability in office) / '[x]'$ Appointment(In office/virtual)/ '[]'$  [facility]/ '[]'$ Home Care/ '[]'$ Refused Recommended Disposition /'[]'$ [facility]/ '[]'$  Follow-up with PCP Additional Notes: pt had OV on 12/10/21 for HTN, medications were changed and pt states her BP has been elevated. She says the only way she can get BP down is if she drinks wine. Scheduled OV for tomorrow at 1340 with Leisa. Advised if develops any sx or BP increases to go to ED. Pt verbalized understanding.   Reason for Disposition  Systolic BP  >= 315 OR Diastolic >= 945  Answer Assessment - Initial Assessment Questions 1. BLOOD PRESSURE: "What is the blood pressure?" "Did you take at least two measurements 5 minutes apart?"     180/106 2. ONSET: "When did you take your blood pressure?"     today 3. HOW: "How did you take your blood pressure?" (e.g., automatic home BP monitor, visiting nurse)     Automatic cuff 4. HISTORY: "Do you have a history of high blood pressure?"     HTN 5. MEDICINES: "Are you taking any medicines for blood pressure?" "Have you missed any doses recently?"     HTN meds changed last week  6. OTHER SYMPTOMS: "Do you have any symptoms?" (e.g., blurred vision, chest pain, difficulty breathing, headache, weakness)     HA, L arm pain  Protocols used: Blood Pressure - High-A-AH

## 2021-12-16 ENCOUNTER — Other Ambulatory Visit
Admission: RE | Admit: 2021-12-16 | Discharge: 2021-12-16 | Disposition: A | Discharge status: Home / Self Care | Payer: 59 | Source: Ambulatory Visit | Attending: Family Medicine | Admitting: Family Medicine

## 2021-12-16 ENCOUNTER — Encounter: Payer: Self-pay | Admitting: Family Medicine

## 2021-12-16 ENCOUNTER — Ambulatory Visit
Admission: RE | Admit: 2021-12-16 | Discharge: 2021-12-16 | Disposition: A | Discharge status: Home / Self Care | Payer: 59 | Source: Ambulatory Visit | Attending: Family Medicine | Admitting: Family Medicine

## 2021-12-16 ENCOUNTER — Telehealth: Payer: Self-pay | Admitting: Family Medicine

## 2021-12-16 ENCOUNTER — Ambulatory Visit (INDEPENDENT_AMBULATORY_CARE_PROVIDER_SITE_OTHER): Payer: 59 | Admitting: Family Medicine

## 2021-12-16 VITALS — BP 146/92 | HR 85 | Resp 16 | Ht 68.0 in

## 2021-12-16 DIAGNOSIS — R5381 Other malaise: Secondary | ICD-10-CM | POA: Insufficient documentation

## 2021-12-16 DIAGNOSIS — M79602 Pain in left arm: Secondary | ICD-10-CM | POA: Insufficient documentation

## 2021-12-16 DIAGNOSIS — I160 Hypertensive urgency: Secondary | ICD-10-CM | POA: Diagnosis present

## 2021-12-16 DIAGNOSIS — R5383 Other fatigue: Secondary | ICD-10-CM | POA: Insufficient documentation

## 2021-12-16 DIAGNOSIS — I10 Essential (primary) hypertension: Secondary | ICD-10-CM

## 2021-12-16 DIAGNOSIS — G43719 Chronic migraine without aura, intractable, without status migrainosus: Secondary | ICD-10-CM

## 2021-12-16 LAB — TROPONIN I (HIGH SENSITIVITY): Troponin I (High Sensitivity): 3 ng/L (ref <18)

## 2021-12-16 MED ORDER — LISINOPRIL 10 MG PO TABS: 20.0000 mg | ORAL_TABLET | Freq: Every day | ORAL | Status: DC

## 2021-12-16 MED ORDER — BUTALBITAL-APAP-CAFFEINE 50-325-40 MG PO TABS: 1.0000 | ORAL_TABLET | Freq: Four times a day (QID) | ORAL | 0 refills | Status: DC | PRN

## 2021-12-16 NOTE — Telephone Encounter (Signed)
Pt already had an appointment scheduled for 10.26 and she said you wanted her to return in two weeks.  Pt would like to know if she need to keep the 10.26 virtual appt or cancel it?

## 2021-12-16 NOTE — Telephone Encounter (Signed)
Ya come in person for f/up in 2 weeks  We are changing her BP meds and need to recheck then that they are working   LT

## 2021-12-16 NOTE — Addendum Note (Signed)
Addended by: [name] on: 12/16/2021 04:29 PM   Modules accepted: Orders, Level of Service

## 2021-12-16 NOTE — Telephone Encounter (Signed)
Pt aware she stated she would like her medication for headache sent to [facility].

## 2021-12-16 NOTE — Telephone Encounter (Signed)
Sent in

## 2021-12-16 NOTE — Progress Notes (Addendum)
Patient ID: [name], female    DOB: December 03, 1965, 56 y.o.   MRN: [id number]  PCP: Danelle Berry, PA-C  Chief Complaint  Patient presents with   Follow-up   Hypertension    BP running high   Blurred Vision   Headache   Arm Pain    Left arm denies chest pain    Subjective:   [name] is a 56 y.o. female, presents to clinic with CC of the following:  HPI  Here for BP recheck She was in office 6 d ago - had not been in office in over a year (18 months or so) last prior refill on lisinopril HCTZ was Nov 2022 with only 6 month supply BP was mildly elevated a few days ago, with anxiety and being out of BP meds a factor She has daily diarrhea, lost weight, had chronic pain, prior hypotensive episodes, so changing from lisinopril-HCTZ to lisinopril only was discussed and decided to avoid further fluid loss/dehydration/electrolyte abnormality  She called into nurse line complianing of 5 days of increased BP with left arm pain  She is here for eval. 140/90-160/100 at home, yesterday called with BP 180/ BP Readings from Last 3 Encounters:  12/16/21 (!) 146/92  12/10/21 132/76  09/08/20 102/74   Left arm and left breast pain no SOB, generally feels bad and weak with more nausea and HA which she attributes to her BP being high.  Her sx started less than 24 hours after our last appt and before she changed her BP med She endorses left arm pain that is not reproducible with palpation or movement, she is not having any palpitations, near syncope, orthopnea, PND, lower extremity edema or exertional symptoms.  She states she has done less activity in general but the activity that she has done did not increase or worsen her symptoms. She is able to walk on the clinic very quickly with normal heart rate and SPO2 She is well-appearing, no fevers chills URI symptoms, cough  Her chronic HA's are a little worse than her normal, she has some floaters shes seen, which is also not new, brief the  other day, none currently. She was previously managed by neurology but then stopped seeing them (for HA's and seizures) once she started seeing pain management.    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 without status migrainosus 04/06/2019   Hypertension    Chronic neck pain    FH: colon polyps 11/15/2016   Other dysphagia 11/15/2016   Other constipation 11/15/2016   PTSD (post-traumatic stress disorder) 05/18/2016   Breast mass, left 01/08/2016   Chronic pain 01/08/2016   GERD (gastroesophageal reflux disease) 01/08/2016   Difficulty sleeping 08/05/2015      Current Outpatient Medications:    albuterol (VENTOLIN HFA) 108 (90 Base) MCG/ACT inhaler, INL 2 PFS PO Q 4 TO 6 H IF NEEDED, Disp: 18 g, Rfl: 3   carisoprodol (SOMA) 350 MG tablet, Take 700 mg by mouth 2 (two) times daily., Disp: , Rfl:    desvenlafaxine (PRISTIQ) 100 MG 24 hr tablet, Take  1 tablet (100 mg total) by mouth daily., Disp: 90 tablet, Rfl: 3   LINZESS 290 MCG CAPS capsule, TAKE 1 CAPSULE BY MOUTH DAILY BEFORE BREAKFAST., Disp: 90 capsule, Rfl: 3   lisinopril (ZESTRIL) 10 MG tablet, Take 1 tablet (10 mg total) by mouth daily., Disp: 90 tablet, Rfl: 3   oxyCODONE (OXY IR/ROXICODONE) 5 MG immediate release tablet, Take 5 mg by mouth 2 (two) times daily., Disp: , Rfl:    Allergies  Allergen Reactions   Contrast Media [Iodinated Contrast Media]    Diphenhydramine Hcl Hives and Itching   Adhesive [Tape]     PAPER TAPE OK   Iodine Other (See Comments)   Prednisone Anxiety   Sulfa Antibiotics Rash      Social History   Tobacco Use   Smoking status: Every Day    Packs/day: 1.00    Types: Cigarettes   Smokeless tobacco: Never  Vaping Use   Vaping Use: Every day   Substances: CBD  Substance Use Topics   Alcohol use: No   Drug use: Yes    Types: Marijuana      Chart Review Today: I personally reviewed active problem list, medication list, allergies, family history, social history, health maintenance, notes from last encounter, lab results, imaging with the patient/caregiver today.   Review of Systems  Constitutional: Negative.   HENT: Negative.    Eyes: Negative.   Respiratory: Negative.    Cardiovascular: Negative.   Gastrointestinal: Negative.   Endocrine: Negative.   Genitourinary: Negative.   Musculoskeletal: Negative.   Skin: Negative.   Allergic/Immunologic: Negative.   Neurological: Negative.   Hematological: Negative.   Psychiatric/Behavioral: Negative.    All other systems reviewed and are negative.      Objective:   Vitals:   12/16/21 1330 12/16/21 1348  BP: (!) 162/94 (!) 146/92  Pulse: 85   Resp: 16   SpO2: 98%   Height: 5\' 8"  (1.727 m)     Body mass index is 18.85 kg/m.  Physical Exam Vitals and nursing note reviewed.  Constitutional:      General: She is not in acute distress.    Appearance: Normal appearance. She is well-developed and underweight. She is not ill-appearing, toxic-appearing or diaphoretic.     Interventions: Face mask in place.  HENT:     Head: Normocephalic and atraumatic.     Right Ear: External ear normal.     Left Ear: External ear normal.  Eyes:     General: Lids are normal. No scleral icterus.       Right eye: No discharge.        Left eye: No discharge.     Conjunctiva/sclera: Conjunctivae normal.  Neck:     Trachea: Phonation normal. No tracheal deviation.  Cardiovascular:     Rate and Rhythm: Normal rate and regular rhythm.     Pulses: Normal pulses.          Radial pulses are 2+ on the right side  and 2+ on the left side.       Posterior tibial pulses are 2+ on the right side and 2+ on the left side.     Heart sounds: Normal heart sounds. No murmur heard.    No friction rub. No gallop.  Pulmonary:     Effort: Pulmonary effort is normal. No respiratory distress.     Breath sounds: Normal breath sounds. No stridor. No wheezing, rhonchi or rales.  Chest:     Chest wall: No tenderness.  Abdominal:     General: Bowel sounds are normal. There is no distension.     Palpations: Abdomen is soft.  Musculoskeletal:     Right lower leg: No edema.     Left lower leg: No edema.  Skin:    General: Skin is warm and dry.     Coloration: Skin is not jaundiced or pale.     Findings: No rash.  Neurological:     Mental Status: She is alert. Mental status is at baseline.     Motor: No abnormal muscle tone.     Gait: Gait normal.  Psychiatric:        Mood and Affect: Mood normal.        Speech: Speech normal.        Behavior: Behavior normal. Behavior is cooperative.      Results for orders placed or performed in visit on 12/10/21  CBC with Differential/Platelet  Result Value Ref Range   WBC 10.7 3.8 - 10.8 Thousand/uL   RBC 5.18 (H) 3.80 - 5.10 Million/uL   Hemoglobin 16.0 (H) 11.7 - 15.5 g/dL   HCT 47.3 (H) 35.0 - 45.0 %   MCV 91.3 80.0 - 100.0 fL   MCH 30.9 27.0 - 33.0 pg   MCHC 33.8 32.0 - 36.0 g/dL   RDW 13.7 11.0 - 15.0 %   Platelets 337 140 - 400 Thousand/uL   MPV 10.6 7.5 - 12.5 fL   Neutro Abs 6,570 1,500 - 7,800 cells/uL   Lymphs Abs 3,381 850 - 3,900 cells/uL   Absolute Monocytes 685 200 - 950 cells/uL   Eosinophils Absolute 43 15 - 500 cells/uL   Basophils Absolute 21 0 - 200 cells/uL   Neutrophils Relative % 61.4 %   Total Lymphocyte 31.6 %   Monocytes Relative 6.4 %   Eosinophils Relative 0.4 %   Basophils Relative 0.2 %  COMPLETE METABOLIC PANEL WITH GFR  Result Value Ref Range   Glucose, Bld 94 65 - 139 mg/dL   BUN 19 7 - 25 mg/dL   Creat 0.74 0.50 - 1.03  mg/dL   eGFR 96 > OR = 60 mL/min/1.71m2   BUN/Creatinine Ratio SEE NOTE: 6 - 22 (calc)   Sodium 140 135 - 146 mmol/L   Potassium 5.3 3.5 - 5.3 mmol/L   Chloride 103 98 - 110 mmol/L   CO2 28 20 - 32 mmol/L   Calcium 10.2 8.6 - 10.4 mg/dL   Total Protein 7.4 6.1 - 8.1 g/dL   Albumin 4.9 3.6 - 5.1 g/dL   Globulin 2.5 1.9 - 3.7 g/dL (calc)   AG Ratio 2.0 1.0 - 2.5 (calc)   Total Bilirubin 0.3 0.2 - 1.2 mg/dL   Alkaline phosphatase (APISO) 110 37 - 153 U/L   AST 19 10 - 35 U/L   ALT 16 6 - 29 U/L  Lipid panel  Result Value Ref Range   Cholesterol 278 (H) <200 mg/dL   HDL 78 > OR = 50 mg/dL   Triglycerides 100 <150 mg/dL   LDL Cholesterol (Calc) 178 (H) mg/dL (calc)   Total CHOL/HDL Ratio 3.6 <5.0 (calc)   Non-HDL Cholesterol (Calc) 200 (H) <130 mg/dL (calc)       Assessment & Plan:    Pt with increased BP x 5 days, better here in office than her home readings, with generalized malaise and fatigue, some headaches and nausea with increased blood pressure and complains of left arm pain without any chest pain, exertional symptoms, dyspnea, orthopnea,  PND, palpitations, lower extreme edema. She is well-appearing and in a really good need mood today in clinic laughing and with her son Ambulatory pulse ox was done and she walked around the office multiple times very quickly without any complaints. On exam she is well-appearing, no concerning findings She does have chronic headaches and daily nausea -so the symptoms or not know that it is slightly worse than her normal EKG done in office is not a 12-lead but the tracing did not show any ST elevation or depression or T wave inversions and I reviewed and compared to her last EKG in her chart and see no changes. She will go over to the hospital for stat troponin and a chest x-ray If anything is positive she will need to check into the ER -I do concerns of atypical presentation for ACS with with this patient but so far everything is reassuring in  office.  If the chest x-ray and troponin are negative, all of her other labs were recently done and unremarkable, the plan is to increase her blood pressure medicines and hopefully her symptoms will improve with her blood pressure returning to normal range.  Plan to increase to lisinopril 20 mg once daily and monitor BP here and there with 2 week in office f/up visit  Hypertensive urgency/emergency and endorgan damage were reviewed with the patient and her son at time of visit and they verbalized understanding of ER precautions.    ICD-10-CM   1. Primary hypertension  I10 lisinopril (ZESTRIL) 10 MG tablet   increase lisinopril to 20 mg and monitor    2. Hypertensive urgency  I16.0 EKG 12-Lead    Troponin I (High Sensitivity)    lisinopril (ZESTRIL) 10 MG tablet    DG Chest 2 View   ECG, CXR, physical exam reassuring    3. Left arm pain  M79.602 EKG 12-Lead    Troponin I (High Sensitivity)    lisinopril (ZESTRIL) 10 MG tablet    DG Chest 2 View   no injury or strain, with multiple associated generalized sx, low threshold to r/o cardiac etiology - stat trop at the hospital, if positive go to ED    4. Malaise and fatigue  R53.81 Troponin I (High Sensitivity)   R53.83 lisinopril (ZESTRIL) 10 MG tablet    DG Chest 2 View   nothing else focal, sx started almost before her BP med changed and prior to high BP readings at home, unclear which came first    5. Intractable 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 without status migrainosus  G43.719 butalbital-acetaminophen-caffeine (FIORICET) 50-325-40 MG tablet   recent increase, previously had fioricet as rescue, she is getting no relief with tylenol, Rx send in for rescue, tylenol and excedrine migraine first        [name], PA-C 12/16/21 2:11 PM

## 2021-12-17 NOTE — Telephone Encounter (Signed)
Pt.notified

## 2021-12-24 ENCOUNTER — Encounter: Payer: Self-pay | Admitting: Family Medicine

## 2021-12-24 ENCOUNTER — Ambulatory Visit (INDEPENDENT_AMBULATORY_CARE_PROVIDER_SITE_OTHER): Payer: 59 | Admitting: Family Medicine

## 2021-12-24 VITALS — BP 138/89 | HR 76

## 2021-12-24 DIAGNOSIS — R9431 Abnormal electrocardiogram [ECG] [EKG]: Secondary | ICD-10-CM

## 2021-12-24 DIAGNOSIS — I10 Essential (primary) hypertension: Secondary | ICD-10-CM

## 2021-12-24 DIAGNOSIS — G43719 Chronic migraine without aura, intractable, without status migrainosus: Secondary | ICD-10-CM

## 2021-12-24 MED ORDER — LISINOPRIL-HYDROCHLOROTHIAZIDE 20-12.5 MG PO TABS: 1.0000 | ORAL_TABLET | Freq: Every day | ORAL | 3 refills | Status: DC

## 2021-12-24 NOTE — Progress Notes (Signed)
Name: [name]   MRN: [id number]    DOB: 05-18-1965   Date:12/24/2021       Progress Note  Subjective:    Chief Complaint  Chief Complaint  Patient presents with   Follow-up   Hypertension    Average home BP 150s/98   Headache    Continues having aches and feeling nausea   Nausea    I connected with  [name] on 12/24/21 at  9:20 AM EDT by telephone and verified that I am speaking with the correct person using two identifiers.   I discussed the limitations, risks, security and privacy concerns of performing an evaluation and management service by telephone and the availability of in person appointments. Staff also discussed with the patient that there may be a patient responsible charge related to this service.  Patient verbalized understanding and agreed to proceed with encounter. Patient Location: home Provider Location: [facility] clinic Additional Individuals present:   Hypertension Associated symptoms include headaches.  Headache  Her past medical history is significant for hypertension.    BP is still higher than it was prior to med change She has taken lisinopril 20 daily for the past two weeks and still having some higher readings 140-150's and then she takes an addition lisinopril and lisinopril-HCTZ.  Seems like the HCTZ brings it back down to SBP 120's  She continues to have some HA's HA left sided and left eye - she has not followed up with neurology She has only taken 3 fioricets since the rx was sent in   She denies CP, SOB, left arm pain, palpitations, syncope, DOE  Patient Active Problem List   Diagnosis Date Noted   Mixed hyperlipidemia 12/09/2021   Polyp of transverse colon    Gastric outlet obstruction 03/20/2020   Chronic obstructive pulmonary disease ([place]) 06/14/2019   Tobacco use 06/14/2019   Traumatic brain injury with loss of consciousness ([place]) 05/08/2019   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   Mood disorder as late effect of traumatic brain  injury ([place]) 05/08/2019   Intractable 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 without status migrainosus 04/06/2019   Hypertension    Chronic neck pain    FH: colon polyps 11/15/2016   Other dysphagia 11/15/2016   Other constipation 11/15/2016   PTSD (post-traumatic stress disorder) 05/18/2016   Breast mass, left 01/08/2016   Chronic pain 01/08/2016   GERD (gastroesophageal reflux disease) 01/08/2016   Difficulty sleeping 08/05/2015    Social History   Tobacco Use   Smoking status: Every Day    Packs/day: 1.00    Types: Cigarettes   Smokeless tobacco: Never  Substance Use Topics   Alcohol use: No     Current Outpatient Medications:    albuterol (VENTOLIN HFA) 108 (90 Base) MCG/ACT inhaler, INL 2 PFS PO Q 4 TO 6 H IF NEEDED, Disp: 18 g, Rfl: 3   butalbital-acetaminophen-caffeine (FIORICET) 50-325-40 MG tablet, Take 1 tablet by mouth every 6 (six) hours as needed (refractory headaches)., Disp: 20 tablet, Rfl: 0   carisoprodol (SOMA) 350 MG tablet, Take 700 mg by mouth 2 (two) times daily., Disp: , Rfl:    desvenlafaxine (PRISTIQ) 100 MG 24 hr tablet, Take 1 tablet (100 mg total) by mouth daily., Disp: 90 tablet, Rfl: 3   LINZESS 290 MCG CAPS capsule, TAKE 1 CAPSULE BY MOUTH DAILY BEFORE BREAKFAST., Disp: 90 capsule, Rfl: 3   [START ON 01/11/2022] lisinopril (ZESTRIL) 10 MG tablet, Take 2 tablets (20 mg total) by mouth daily., Disp: ,  Rfl:    oxyCODONE (OXY IR/ROXICODONE) 5 MG immediate release tablet, Take 5 mg by mouth 2 (two) times daily., Disp: , Rfl:   Allergies  Allergen Reactions   Contrast Media [Iodinated Contrast Media]    Diphenhydramine Hcl Hives and Itching   Adhesive [Tape]     PAPER TAPE OK   Iodine Other (See Comments)   Prednisone Anxiety   Sulfa Antibiotics Rash    Chart Review: I personally reviewed active problem list, medication list, allergies, family history, social history, health maintenance, notes from last encounter, lab results, imaging with the  patient/caregiver today.   Review of Systems  Constitutional: Negative.   HENT: Negative.    Eyes: Negative.   Respiratory: Negative.    Cardiovascular: Negative.   Gastrointestinal: Negative.   Endocrine: Negative.   Genitourinary: Negative.   Musculoskeletal: Negative.   Skin: Negative.   Allergic/Immunologic: Negative.   Neurological:  Positive for headaches.  Hematological: Negative.   Psychiatric/Behavioral: Negative.    All other systems reviewed and are negative.    Objective:    Virtual encounter, vitals limited, only able to obtain the following Today's Vitals   12/24/21 0847  BP: 138/89  Pulse: 76   There is no height or weight on file to calculate BMI. Nursing Note and Vital Signs reviewed.  Physical Exam Vitals and nursing note reviewed.  Neurological:     Mental Status: She is alert.  Psychiatric:        Mood and Affect: Mood normal.   Phonation clear  PE limited by telephone encounter  No results found for this or any previous visit (from the past 72 hour(s)).  Assessment and Plan:     ICD-10-CM   1. Primary hypertension  I10 lisinopril-hydrochlorothiazide (ZESTORETIC) 20-12.5 MG tablet   change meds back to lisinopril-HCTZ, f/up in 2 weeks, if HA persists she was instructed to get f/up with her managing neurologist    2. QT prolongation  R94.31    on several past ECG's, careful with QTc prolonging meds - abx, antiemetics    3. Intractable 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 without status migrainosus  G43.719    still worse than he prior baseline, BP is better controlled, meds changes, monitor for 2 more weeks and if HA still worse f/up with neuro      -Red flags and when to present for emergency care or RTC including but not limited to new/worsening/un-resolving symptoms,  reviewed with patient at time of visit. Follow up and care instructions discussed and provided in AVS. - I discussed the assessment and treatment plan with the patient.  The patient was provided an opportunity to ask questions and all were answered. The patient agreed with the plan and demonstrated an understanding of the instructions.  - The patient was advised to call back or seek an in-person evaluation if the symptoms worsen or if the condition fails to improve as anticipated.  I provided 12 minutes of non-face-to-face time during this encounter.  [name], PA-C 12/24/21 9:17 AM

## 2021-12-31 ENCOUNTER — Ambulatory Visit: Payer: 59 | Admitting: Family Medicine

## 2021-12-31 DIAGNOSIS — I10 Essential (primary) hypertension: Secondary | ICD-10-CM

## 2021-12-31 NOTE — Progress Notes (Deleted)
    Patient ID: [name], female    DOB: 25-Jul-1965, 56 y.o.   MRN: [id number]  PCP: [name], PA-C  No chief complaint on file.   Subjective:   [name] is a 56 y.o. 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[place]) 05/08/2019   Intractable 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 without status migrainosus 04/06/2019   Hypertension    Chronic neck pain    FH: colon polyps 11/15/2016   Other dysphagia 11/15/2016   Other constipation 11/15/2016   PTSD (post-traumatic stress disorder) 05/18/2016   Breast mass, left 01/08/2016   Chronic pain 01/08/2016   GERD (gastroesophageal reflux disease) 01/08/2016   Difficulty sleeping 08/05/2015      Current Outpatient Medications:    albuterol (VENTOLIN HFA) 108 (90 Base) MCG/ACT inhaler, INL 2 PFS PO Q 4 TO 6 H IF NEEDED, Disp: 18 g, Rfl: 3   butalbital-acetaminophen-caffeine (FIORICET) 50-325-40 MG tablet, Take 1 tablet by mouth every 6 (six) hours as needed (refractory headaches)., Disp: 20 tablet, Rfl: 0   carisoprodol (SOMA) 350 MG tablet, Take 700 mg by mouth 2 (two) times daily., Disp: , Rfl:    desvenlafaxine (PRISTIQ) 100 MG 24 hr tablet, Take 1 tablet (100 mg total) by mouth daily., Disp: 90 tablet, Rfl: 3   LINZESS 290 MCG CAPS capsule, TAKE 1 CAPSULE BY MOUTH DAILY BEFORE BREAKFAST., Disp: 90 capsule, Rfl: 3   lisinopril-hydrochlorothiazide (ZESTORETIC) 20-12.5 MG tablet, Take 1 tablet by mouth daily., Disp: 90 tablet, Rfl: 3   oxyCODONE (OXY IR/ROXICODONE) 5 MG immediate release tablet, Take 5 mg by mouth 2  (two) times daily., Disp: , Rfl:    Allergies  Allergen Reactions   Contrast Media [Iodinated Contrast Media]    Diphenhydramine Hcl Hives and Itching   Adhesive [Tape]     PAPER TAPE OK   Iodine Other (See Comments)   Prednisone Anxiety   Sulfa Antibiotics Rash     Social History   Tobacco Use   Smoking status: Every Day    Packs/day: 1.00    Types: Cigarettes   Smokeless tobacco: Never  Vaping Use   Vaping Use: Every day   Substances: CBD  Substance Use Topics   Alcohol use: No   Drug use: Yes    Types: Marijuana      Chart Review Today: I personally reviewed active problem list, medication list, allergies, family history, social history, health maintenance, notes from last encounter, lab results, imaging with the patient/caregiver today.   Review of Systems     Objective:   There were no vitals filed for this visit.  There is no height or weight on file to calculate BMI.  Physical Exam   Results for orders placed or performed during the hospital encounter of 12/16/21  Troponin I (High Sensitivity)  Result Value Ref Range   Troponin I (High Sensitivity) 3 <18 ng/L       Assessment & Plan:   ***     [name], PA-C 12/31/21 10:10 AM

## 2022-01-12 ENCOUNTER — Other Ambulatory Visit: Payer: Self-pay | Admitting: Family Medicine

## 2022-01-12 DIAGNOSIS — G43719 Chronic migraine without aura, intractable, without status migrainosus: Secondary | ICD-10-CM

## 2022-01-12 NOTE — Telephone Encounter (Signed)
Requested medication (s) are due for refill today: yes  Requested medication (s) are on the active medication list: yes  Last refill:  12/16/21 #20/0  Future visit scheduled: no  Notes to clinic:  Unable to refill per protocol, cannot delegate.    Requested Prescriptions  Pending Prescriptions Disp Refills   butalbital-acetaminophen-caffeine (FIORICET) 50-325-40 MG tablet [Pharmacy Med Name: BUTALB-ACETAMIN-CAFF 50-325-40] 20 tablet     Sig: Take 1 tablet by mouth every 6 (six) hours as needed (refractory headaches).     Not Delegated - Analgesics:  Non-Opioid Analgesic Combinations 2 Failed - 01/12/2022  5:05 PM      Failed - This refill cannot be delegated      Passed - Cr in normal range and within 360 days    Creat  Date Value Ref Range Status  12/10/2021 0.74 0.50 - 1.03 mg/dL Final         Passed - eGFR is 10 or above and within 360 days    GFR, Est African American  Date Value Ref Range Status  01/17/2020 98 > OR = 60 mL/min/1.73m2 Final   GFR calc Af Amer  Date Value Ref Range Status  03/20/2020 79 >59 mL/min/1.73 Final    Comment:    **In accordance with recommendations from the NKF-ASN Task force,**   [facility] is in the process of updating its eGFR calculation to the   2021 CKD-EPI creatinine equation that estimates kidney function   without a race variable.    GFR, Est Non African American  Date Value Ref Range Status  01/17/2020 85 > OR = 60 mL/min/1.73m2 Final   GFR, Estimated  Date Value Ref Range Status  05/05/2020 >60 >60 mL/min Final    Comment:    (NOTE) Calculated using the CKD-EPI Creatinine Equation (2021)    eGFR  Date Value Ref Range Status  12/10/2021 96 > OR = 60 mL/min/1.73m2 Final         Passed - Patient is not pregnant      Passed - Valid encounter within last 12 months    Recent Outpatient Visits           2 weeks ago Primary hypertension   [facility] Tapia, Leisa, PA-C   3 weeks ago Primary  hypertension   [facility] Tapia, Leisa, PA-C   1 month ago Primary hypertension   [facility] Tapia, Leisa, PA-C   1 year ago Encounter for well woman exam with routine gynecological exam   [facility] Tapia, Leisa, PA-C   1 year ago Chronic obstructive pulmonary disease, unspecified COPD type (HCC)   [facility] Tapia, Leisa, PA-C               

## 2022-01-15 ENCOUNTER — Ambulatory Visit
Admission: RE | Admit: 2022-01-15 | Discharge: 2022-01-15 | Disposition: A | Discharge status: Home / Self Care | Payer: 59 | Source: Ambulatory Visit | Attending: Family Medicine | Admitting: Family Medicine

## 2022-01-15 DIAGNOSIS — Z1231 Encounter for screening mammogram for malignant neoplasm of breast: Secondary | ICD-10-CM | POA: Diagnosis present

## 2022-03-19 ENCOUNTER — Other Ambulatory Visit: Payer: Self-pay | Admitting: Family Medicine

## 2022-03-19 DIAGNOSIS — E782 Mixed hyperlipidemia: Secondary | ICD-10-CM

## 2022-03-19 NOTE — Telephone Encounter (Signed)
Requested Prescriptions  Pending Prescriptions Disp Refills   albuterol (VENTOLIN HFA) 108 (90 Base) MCG/ACT inhaler [Pharmacy Med Name: ALBUTEROL HFA (PROAIR) INHALER] 8.5 each 3    Sig: INHALE 2 PUFFS BY MOUTH EVERY 4 TO 6 HOURS AS NEEDED     Pulmonology:  Beta Agonists 2 Passed - 03/19/2022 12:22 AM      Passed - Last BP in normal range    BP Readings from Last 1 Encounters:  12/24/21 138/89         Passed - Last Heart Rate in normal range    Pulse Readings from Last 1 Encounters:  12/24/21 76         Passed - Valid encounter within last 12 months    Recent Outpatient Visits           2 months ago Primary hypertension   [facility] [name], PA-C   3 months ago Primary hypertension   [facility] [name], PA-C   3 months ago Primary hypertension   [facility] [name], PA-C   1 year ago Encounter for well woman exam with routine gynecological exam   [facility] [name], PA-C   1 year ago Chronic obstructive pulmonary disease, unspecified COPD type [facility])   [facility] [name], [place]

## 2022-04-21 ENCOUNTER — Other Ambulatory Visit: Payer: Self-pay | Admitting: Gastroenterology

## 2022-04-29 NOTE — Patient Instructions (Incomplete)
Preventive Care 40-55 Years Old, Female Preventive care refers to lifestyle choices and visits with your health care provider that can promote health and wellness. Preventive care visits are also called wellness exams. What can I expect for my preventive care visit? Counseling Your health care provider may ask you questions about your: Medical history, including: Past medical problems. Family medical history. Pregnancy history. Current health, including: Menstrual cycle. Method of birth control. Emotional well-being. Home life and relationship well-being. Sexual activity and sexual health. Lifestyle, including: Alcohol, nicotine or tobacco, and drug use. Access to firearms. Diet, exercise, and sleep habits. Work and work environment. Sunscreen use. Safety issues such as seatbelt and bike helmet use. Physical exam Your health care provider will check your: Height and weight. These may be used to calculate your BMI (body mass index). BMI is a measurement that tells if you are at a healthy weight. Waist circumference. This measures the distance around your waistline. This measurement also tells if you are at a healthy weight and may help predict your risk of certain diseases, such as type 2 diabetes and high blood pressure. Heart rate and blood pressure. Body temperature. Skin for abnormal spots. What immunizations do I need?  Vaccines are usually given at various ages, according to a schedule. Your health care provider will recommend vaccines for you based on your age, medical history, and lifestyle or other factors, such as travel or where you work. What tests do I need? Screening Your health care provider may recommend screening tests for certain conditions. This may include: Lipid and cholesterol levels. Diabetes screening. This is done by checking your blood sugar (glucose) after you have not eaten for a while (fasting). Pelvic exam and Pap test. Hepatitis B test. Hepatitis C  test. HIV (human immunodeficiency virus) test. STI (sexually transmitted infection) testing, if you are at risk. Lung cancer screening. Colorectal cancer screening. Mammogram. Talk with your health care provider about when you should start having regular mammograms. This may depend on whether you have a family history of breast cancer. BRCA-related cancer screening. This may be done if you have a family history of breast, ovarian, tubal, or peritoneal cancers. Bone density scan. This is done to screen for osteoporosis. Talk with your health care provider about your test results, treatment options, and if necessary, the need for more tests. Follow these instructions at home: Eating and drinking  Eat a diet that includes fresh fruits and vegetables, whole grains, lean protein, and low-fat dairy products. Take vitamin and mineral supplements as recommended by your health care provider. Do not drink alcohol if: Your health care provider tells you not to drink. You are pregnant, may be pregnant, or are planning to become pregnant. If you drink alcohol: Limit how much you have to 0-1 drink a day. Know how much alcohol is in your drink. In the [place] one drink equals one 12 oz bottle of beer (355 mL), one 5 oz glass of wine (148 mL), or one 1 oz glass of hard liquor (44 mL). Lifestyle Brush your teeth every morning and night with fluoride toothpaste. Floss one time each day. Exercise for at least 30 minutes 5 or more days each week. Do not use any products that contain nicotine or tobacco. These products include cigarettes, chewing tobacco, and vaping devices, such as e-cigarettes. If you need help quitting, ask your health care provider. Do not use drugs. If you are sexually active, practice safe sex. Use a condom or other form of protection to   prevent STIs. If you do not wish to become pregnant, use a form of birth control. If you plan to become pregnant, see your health care provider for a  prepregnancy visit. Take aspirin only as told by your health care provider. Make sure that you understand how much to take and what form to take. Work with your health care provider to find out whether it is safe and beneficial for you to take aspirin daily. Find healthy ways to manage stress, such as: Meditation, yoga, or listening to music. Journaling. Talking to a trusted person. Spending time with friends and family. Minimize exposure to UV radiation to reduce your risk of skin cancer. Safety Always wear your seat belt while driving or riding in a vehicle. Do not drive: If you have been drinking alcohol. Do not ride with someone who has been drinking. When you are tired or distracted. While texting. If you have been using any mind-altering substances or drugs. Wear a helmet and other protective equipment during sports activities. If you have firearms in your house, make sure you follow all gun safety procedures. Seek help if you have been physically or sexually abused. What's next? Visit your health care provider once a year for an annual wellness visit. Ask your health care provider how often you should have your eyes and teeth checked. Stay up to date on all vaccines. This information is not intended to replace advice given to you by your health care provider. Make sure you discuss any questions you have with your health care provider. Document Revised: 08/13/2020 Document Reviewed: 08/13/2020 Elsevier Patient Education  2023 Elsevier Inc.  

## 2022-04-30 ENCOUNTER — Encounter: Payer: 59 | Admitting: Family Medicine

## 2022-04-30 DIAGNOSIS — Z0000 Encounter for general adult medical examination without abnormal findings: Secondary | ICD-10-CM

## 2022-05-04 ENCOUNTER — Encounter: Payer: Self-pay | Admitting: Family Medicine

## 2022-05-04 ENCOUNTER — Ambulatory Visit (INDEPENDENT_AMBULATORY_CARE_PROVIDER_SITE_OTHER): Payer: 59 | Admitting: Family Medicine

## 2022-05-04 VITALS — BP 150/84 | HR 99 | Temp 97.9°F | Resp 16 | Ht 68.0 in | Wt 136.0 lb

## 2022-05-04 DIAGNOSIS — E782 Mixed hyperlipidemia: Secondary | ICD-10-CM

## 2022-05-04 DIAGNOSIS — F0630 Mood disorder due to known physiological condition, unspecified: Secondary | ICD-10-CM

## 2022-05-04 DIAGNOSIS — I10 Essential (primary) hypertension: Secondary | ICD-10-CM

## 2022-05-04 DIAGNOSIS — R197 Diarrhea, unspecified: Secondary | ICD-10-CM

## 2022-05-04 DIAGNOSIS — R109 Unspecified abdominal pain: Secondary | ICD-10-CM

## 2022-05-04 DIAGNOSIS — G43719 Chronic migraine without aura, intractable, without status migrainosus: Secondary | ICD-10-CM | POA: Diagnosis not present

## 2022-05-04 DIAGNOSIS — R112 Nausea with vomiting, unspecified: Secondary | ICD-10-CM

## 2022-05-04 DIAGNOSIS — F482 Pseudobulbar affect: Secondary | ICD-10-CM | POA: Diagnosis not present

## 2022-05-04 DIAGNOSIS — J449 Chronic obstructive pulmonary disease, unspecified: Secondary | ICD-10-CM

## 2022-05-04 DIAGNOSIS — S069XAS Unspecified intracranial injury with loss of consciousness status unknown, sequela: Secondary | ICD-10-CM

## 2022-05-04 DIAGNOSIS — K3184 Gastroparesis: Secondary | ICD-10-CM

## 2022-05-04 MED ORDER — ONDANSETRON HCL 4 MG PO TABS: 4.0000 mg | ORAL_TABLET | Freq: Three times a day (TID) | ORAL | 0 refills | Status: DC | PRN

## 2022-05-04 NOTE — Progress Notes (Unsigned)
Name: [name]   MRN: [id number]    DOB: 1966-02-04   Date:05/04/2022       Progress Note  Chief Complaint  Patient presents with   Follow-up   Hyperlipidemia   Hypertension     Subjective:   [name] is a 57 y.o. female, presents to clinic for   A lot of acute issues recently with GI virus for the past 1-2  N, V, D able to keep down fluids but not tolerating solids for about a week - did a small amount of parfait Bloating, pain Waiting for GI appt in May, on wait list    Hypertension:  Currently managed on lisinopril HCTZ  Pt reports good med compliance and denies any SE.   Blood pressure today is mildly elevated today.    BP Readings from Last 10 Encounters:  05/04/22 (!) 144/86  12/24/21 138/89  12/16/21 (!) 146/92  12/10/21 132/76  09/08/20 102/74  07/17/20 103/71  06/19/20 106/76  06/13/20 (!) 144/93  06/09/20 108/64  05/21/20 113/80   Pt denies CP, SOB, exertional sx, LE edema, palpitation, Ha's, visual disturbances, lightheadedness, hypotension, syncope. Dietary efforts for BP?  none    Xtampza Er 9 Mg Capsule med change since Oct- change to pain meds has severely upset stomach and caused increased stress/anxiety            Current Outpatient Medications:    albuterol (VENTOLIN HFA) 108 (90 Base) MCG/ACT inhaler, INHALE 2 PUFFS BY MOUTH EVERY 4 TO 6 HOURS AS NEEDED, Disp: 8.5 each, Rfl: 3   carisoprodol (SOMA) 350 MG tablet, Take 700 mg by mouth 2 (two) times daily., Disp: , Rfl:    desvenlafaxine (PRISTIQ) 100 MG 24 hr tablet, Take 1 tablet (100 mg total) by mouth daily., Disp: 90 tablet, Rfl: 3   LINZESS 290 MCG CAPS capsule, TAKE 1 CAPSULE BY MOUTH DAILY BEFORE BREAKFAST., Disp: 90 capsule, Rfl: 3   lisinopril-hydrochlorothiazide (ZESTORETIC) 20-12.5 MG tablet, Take 1 tablet by mouth daily., Disp: 90 tablet, Rfl: 3   XTAMPZA ER 9 MG C12A, Take 1 capsule by mouth every 12 (twelve) hours., Disp: , Rfl:    butalbital-acetaminophen-caffeine  (FIORICET) 50-325-40 MG tablet, Take 1 tablet by mouth every 6 (six) hours as needed (refractory headaches). (Patient not taking: Reported on 05/04/2022), Disp: 20 tablet, Rfl: 0   oxyCODONE (OXY IR/ROXICODONE) 5 MG immediate release tablet, Take 5 mg by mouth 2 (two) times daily. 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[place]) 05/08/2019   Intractable 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 without status migrainosus 04/06/2019   Hypertension    Chronic neck pain    FH: colon polyps 11/15/2016   Other dysphagia 11/15/2016   Other constipation 11/15/2016   PTSD (post-traumatic stress disorder) 05/18/2016   Breast mass, left 01/08/2016   Chronic pain 01/08/2016   GERD (gastroesophageal reflux disease) 01/08/2016   Difficulty sleeping 08/05/2015    Past Surgical History:  Procedure Laterality Date   abd pain     APPENDECTOMY     BACK SURGERY     LUMBAR  FUSIONS X 2   CATARACT EXTRACTION W/PHACO Right 01/07/2015   Procedure: CATARACT EXTRACTION PHACO AND INTRAOCULAR LENS PLACEMENT (IOC);  Surgeon: Birder Robson, MD;  Location: [facility] ORS;  Service: Ophthalmology;  Laterality: Right;  [place] 0 AP% 0 CDE 0 fluid pack lot [id number]   CATARACT EXTRACTION W/PHACO Left 01/28/2015   Procedure: CATARACT EXTRACTION PHACO AND INTRAOCULAR LENS PLACEMENT (IOC);  Surgeon: Birder Robson, MD;  Location: [facility] ORS;  Service: Ophthalmology;  Laterality: Left;  [place] 00:18 AP%7.1 CDE1.32 fluid pack lot # [id number]   Cerebral Catheterization for Brain Bleed  02/02/2019   [facility]   COLONOSCOPY WITH  PROPOFOL N/A 05/07/2020   Procedure: COLONOSCOPY WITH PROPOFOL;  Surgeon: Lin Landsman, MD;  Location: [facility]COPY;  Service: Gastroenterology;  Laterality: N/A;   COLONOSCOPY WITH PROPOFOL N/A 06/13/2020   Procedure: COLONOSCOPY WITH PROPOFOL;  Surgeon: [name], MD;  Location[facility] ENDOSCOPY;  Service: Endoscopy;  Laterality: N/A;   ESOPHAGOGASTRODUODENOSCOPY (EGD) WITH PROPOFOL N/A 05/07/2020   Procedure: ESOPHAGOGASTRODUODENOSCOPY (EGD) WITH PROPOFOL;  Surgeon: Lin Landsman, MD;  Location: [facility] ENDOSCOPY;  Service: Gastroenterology;  Laterality: N/A;   ESOPHAGOGASTRODUODENOSCOPY (EGD) WITH PROPOFOL N/A 06/13/2020   Procedure: ESOPHAGOGASTRODUODENOSCOPY (EGD) WITH PROPOFOL;  Surgeon: [name], MD;  Location: [facility] ENDOSCOPY;  Service: Endoscopy;  Laterality: N/A;   LEEP     XI ROBOT ABDOMINAL PERINEAL RESECTION N/A 01/24/2019   Procedure: Robot assisted appendectomy ;  Surgeon: Ronny Bacon, MD;  Location: [facility] ORS;  Service: General;  Laterality: N/A;    Family History  Problem Relation Age of Onset   Ovarian cancer Maternal Aunt        40's   Depression Mother    Heart disease Father    Hypertension Father    Diabetes Father    Post-traumatic stress disorder Sister    Hypertension Sister    Diabetes Brother    Depression Brother    Hypertension Brother     Social History   Tobacco Use   Smoking status: Every Day    Packs/day: 1.00    Types: Cigarettes   Smokeless tobacco: Never  Vaping Use   Vaping Use: Every day   Substances: CBD  Substance Use Topics   Alcohol use: No   Drug use: Yes    Types: Marijuana     Allergies  Allergen Reactions   Contrast Media [Iodinated Contrast Media]    Diphenhydramine Hcl Hives and Itching   Adhesive [Tape]     PAPER TAPE OK   Iodine Other (See Comments)   Prednisone Anxiety   Sulfa Antibiotics Rash    Health Maintenance  Topic Date Due   Medicare Annual Wellness (AWV)  03/15/2017   Lung Cancer  Screening  05/04/2023 (Originally 04/13/2017)   PAP SMEAR-Modifier  09/09/2023   MAMMOGRAM  01/16/2024   COLONOSCOPY (Pts 45-55yr Insurance coverage will need to be confirmed)  06/14/2027   DTaP/Tdap/Td (2 - Td or Tdap) 09/06/2027   Hepatitis C Screening  Completed   HIV Screening  Completed   HPV VACCINES  Aged Out   INFLUENZA VACCINE  Discontinued   COVID-19 Vaccine  Discontinued   Zoster Vaccines- Shingrix  Discontinued    Chart Review Today: I personally reviewed active problem list, medication list, allergies, family history, social history, health maintenance, notes from last encounter, lab results, imaging with the patient/caregiver today.   Review of Systems  Constitutional: Negative.   HENT: Negative.    Eyes: Negative.   Respiratory: Negative.    Cardiovascular: Negative.  Gastrointestinal: Negative.   Endocrine: Negative.   Genitourinary: Negative.   Musculoskeletal: Negative.   Skin: Negative.   Allergic/Immunologic: Negative.   Neurological: Negative.   Hematological: Negative.   Psychiatric/Behavioral: Negative.    All other systems reviewed and are negative.    Objective:   Vitals:   05/04/22 0927 05/04/22 1010  BP: (!) 144/86 (!) 150/84  Pulse: 99   Resp: 16   Temp: 97.9 F (36.6 C)   TempSrc: Oral   SpO2: 97%   Weight: 136 lb (61.7 kg)   Height: '5\' 8"'$  (1.727 m)     Body mass index is 20.68 kg/m.  Physical Exam Vitals and nursing note reviewed.  Constitutional:      General: She is not in acute distress.    Appearance: Normal appearance. She is well-developed. She is not ill-appearing, toxic-appearing or diaphoretic.  HENT:     Head: Normocephalic and atraumatic.     Nose: Nose normal.  Eyes:     General:        Right eye: No discharge.        Left eye: No discharge.     Conjunctiva/sclera: Conjunctivae normal.  Neck:     Trachea: No tracheal deviation.  Cardiovascular:     Rate and Rhythm: Normal rate and regular rhythm.      Pulses: Normal pulses.     Heart sounds: Normal heart sounds. No murmur heard.    No friction rub.  Pulmonary:     Effort: Pulmonary effort is normal. No respiratory distress.     Breath sounds: No stridor. No wheezing, rhonchi or rales.  Abdominal:     Palpations: Abdomen is soft.     Tenderness: There is abdominal tenderness.  Skin:    General: Skin is warm and dry.     Findings: No rash.  Neurological:     Mental Status: She is alert.     Motor: No abnormal muscle tone.     Coordination: Coordination normal.  Psychiatric:        Mood and Affect: Mood normal.        Behavior: Behavior normal.         Assessment & Plan:   Problem List Items Addressed This Visit       Cardiovascular and Mediastinum   Hypertension - Primary    BP elevated today, is often elevated when having worse pain or anxiety In the past when other factors are controlled BP is controlled to even sometimes soft Will continue with same BP med, has been well controlled Pt has a lot of med changes, increased stress, she's had severe GI sx flare- no med changes at this time. BP Readings from Last 3 Encounters:  05/04/22 (!) 150/84  12/24/21 138/89  12/16/21 (!) 146/92        Relevant Orders   COMPLETE METABOLIC PANEL WITH GFR (Completed)   Intractable 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 without status migrainosus    Managed by specialist      Relevant Medications   XTAMPZA ER 9 MG C12A   Other Relevant Orders   CBC with Differential/Platelet (Completed)   COMPLETE METABOLIC PANEL WITH GFR (Completed)     Respiratory   Chronic obstructive pulmonary disease (HCC)    Not needing albuterol inhaler and denies any exertional shortness of breath, wheeze,  She's cutting back smoking Sx stable w/o any recent exacerbations        Other   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  [place])    Overall pt is doing excellent in regulating moods, even with recent stressors She is on pristiq  which works well for her- score highly positive today    05/04/2022    8:58 AM 12/24/2021    8:48 AM 12/16/2021    1:31 PM  Depression screen PHQ 2/9  Decreased Interest '3 2 2  '$ Down, Depressed, Hopeless '3 2 2  '$ PHQ - 2 Score '6 4 4  '$ Altered sleeping '2 2 2  '$ Tired, decreased energy '2 2 2  '$ Change in appetite 3 0 0  Feeling bad or failure about yourself  1 0 0  Trouble concentrating 3 0 0  Moving slowly or fidgety/restless 3 0 0  Suicidal thoughts 0 0 0  PHQ-9 Score '20 8 8  '$ Difficult doing work/chores Very difficult Somewhat difficult Somewhat difficult        Mixed hyperlipidemia   Relevant Orders   COMPLETE METABOLIC PANEL WITH GFR (Completed)   RESOLVED: Pseudobulbar affect   Other Visit Diagnoses     Gastroparesis       worse sx since pain meds were changed, she is pending GI f/up   Relevant Orders   COMPLETE METABOLIC PANEL WITH GFR (Completed)   Lipase (Completed)   Nausea, vomiting and diarrhea       recent suspected GI illness per pt   Relevant Medications   ondansetron (ZOFRAN) 4 MG tablet   Other Relevant Orders   CBC with Differential/Platelet (Completed)   COMPLETE METABOLIC PANEL WITH GFR (Completed)   Lipase (Completed)   Abdominal pain, unspecified abdominal location       bloating and tenderness today, antiemetics sent in, she has GI f/up, labs ordered, ER precautions reviewed   Relevant Medications   ondansetron (ZOFRAN) 4 MG tablet   Other Relevant Orders   CBC with Differential/Platelet (Completed)   COMPLETE METABOLIC PANEL WITH GFR (Completed)   Lipase (Completed)        Return in about 6 months (around 11/04/2022) for [place] Follow-up.   [name], PA-C 05/04/22 9:46 AM

## 2022-05-05 LAB — CBC WITH DIFFERENTIAL/PLATELET
Absolute Monocytes: 806 cells/uL (ref 200–950)
Basophils Absolute: 26 cells/uL (ref 0–200)
Basophils Relative: 0.2 %
Eosinophils Absolute: 38 cells/uL (ref 15–500)
Eosinophils Relative: 0.3 %
HCT: 45.4 % — ABNORMAL HIGH (ref 35.0–45.0)
Hemoglobin: 15.4 g/dL (ref 11.7–15.5)
Lymphs Abs: 3277 cells/uL (ref 850–3900)
MCH: 32 pg (ref 27.0–33.0)
MCHC: 33.9 g/dL (ref 32.0–36.0)
MCV: 94.2 fL (ref 80.0–100.0)
MPV: 10.6 fL (ref 7.5–12.5)
Monocytes Relative: 6.3 %
Neutro Abs: 8653 cells/uL — ABNORMAL HIGH (ref 1500–7800)
Neutrophils Relative %: 67.6 %
Platelets: 372 10*3/uL (ref 140–400)
RBC: 4.82 10*6/uL (ref 3.80–5.10)
RDW: 12.6 % (ref 11.0–15.0)
Total Lymphocyte: 25.6 %
WBC: 12.8 10*3/uL — ABNORMAL HIGH (ref 3.8–10.8)

## 2022-05-05 LAB — COMPLETE METABOLIC PANEL WITH GFR
AG Ratio: 1.8 (calc) (ref 1.0–2.5)
ALT: 15 U/L (ref 6–29)
AST: 19 U/L (ref 10–35)
Albumin: 4.8 g/dL (ref 3.6–5.1)
Alkaline phosphatase (APISO): 94 U/L (ref 37–153)
BUN: 9 mg/dL (ref 7–25)
CO2: 28 mmol/L (ref 20–32)
Calcium: 10.4 mg/dL (ref 8.6–10.4)
Chloride: 101 mmol/L (ref 98–110)
Creat: 0.72 mg/dL (ref 0.50–1.03)
Globulin: 2.7 g/dL (calc) (ref 1.9–3.7)
Glucose, Bld: 98 mg/dL (ref 65–99)
Potassium: 4.5 mmol/L (ref 3.5–5.3)
Sodium: 140 mmol/L (ref 135–146)
Total Bilirubin: 0.4 mg/dL (ref 0.2–1.2)
Total Protein: 7.5 g/dL (ref 6.1–8.1)
eGFR: 99 mL/min/{1.73_m2} (ref >=60)

## 2022-05-05 LAB — LIPASE: Lipase: 18 U/L (ref 7–60)

## 2022-05-06 ENCOUNTER — Encounter: Payer: Self-pay | Admitting: Family Medicine

## 2022-05-06 NOTE — Assessment & Plan Note (Signed)
Not needing albuterol inhaler and denies any exertional shortness of breath, wheeze,  She's cutting back smoking Sx stable w/o any recent exacerbations

## 2022-05-06 NOTE — Assessment & Plan Note (Signed)
Managed by specialist 

## 2022-05-06 NOTE — Assessment & Plan Note (Signed)
Overall pt is doing excellent in regulating moods, even with recent stressors She is on pristiq which works well for her- score highly positive today    05/04/2022    8:58 AM 12/24/2021    8:48 AM 12/16/2021    1:31 PM  Depression screen PHQ 2/9  Decreased Interest '3 2 2  '$ Down, Depressed, Hopeless '3 2 2  '$ PHQ - 2 Score '6 4 4  '$ Altered sleeping '2 2 2  '$ Tired, decreased energy '2 2 2  '$ Change in appetite 3 0 0  Feeling bad or failure about yourself  1 0 0  Trouble concentrating 3 0 0  Moving slowly or fidgety/restless 3 0 0  Suicidal thoughts 0 0 0  PHQ-9 Score '20 8 8  '$ Difficult doing work/chores Very difficult Somewhat difficult Somewhat difficult

## 2022-05-06 NOTE — Assessment & Plan Note (Signed)
BP elevated today, is often elevated when having worse pain or anxiety In the past when other factors are controlled BP is controlled to even sometimes soft Will continue with same BP med, has been well controlled Pt has a lot of med changes, increased stress, she's had severe GI sx flare- no med changes at this time. BP Readings from Last 3 Encounters:  05/04/22 (!) 150/84  12/24/21 138/89  12/16/21 (!) 146/92

## 2022-05-07 ENCOUNTER — Other Ambulatory Visit: Payer: Self-pay

## 2022-05-07 ENCOUNTER — Encounter: Payer: Self-pay | Admitting: Family Medicine

## 2022-05-07 ENCOUNTER — Ambulatory Visit (INDEPENDENT_AMBULATORY_CARE_PROVIDER_SITE_OTHER): Payer: 59 | Admitting: Family Medicine

## 2022-05-07 ENCOUNTER — Emergency Department
Admission: EM | Admit: 2022-05-07 | Discharge: 2022-05-07 | Disposition: A | Discharge status: Home / Self Care | Payer: 59 | Attending: Student in an Organized Health Care Education/Training Program | Admitting: Student in an Organized Health Care Education/Training Program

## 2022-05-07 ENCOUNTER — Emergency Department: Payer: 59

## 2022-05-07 VITALS — BP 106/70 | HR 90 | Temp 98.0°F | Resp 16 | Ht 68.0 in | Wt 136.0 lb

## 2022-05-07 DIAGNOSIS — R1013 Epigastric pain: Secondary | ICD-10-CM | POA: Diagnosis present

## 2022-05-07 DIAGNOSIS — Z8719 Personal history of other diseases of the digestive system: Secondary | ICD-10-CM

## 2022-05-07 DIAGNOSIS — K311 Adult hypertrophic pyloric stenosis: Secondary | ICD-10-CM

## 2022-05-07 DIAGNOSIS — K921 Melena: Secondary | ICD-10-CM | POA: Diagnosis not present

## 2022-05-07 DIAGNOSIS — R112 Nausea with vomiting, unspecified: Secondary | ICD-10-CM | POA: Insufficient documentation

## 2022-05-07 DIAGNOSIS — R1084 Generalized abdominal pain: Secondary | ICD-10-CM | POA: Diagnosis not present

## 2022-05-07 DIAGNOSIS — R197 Diarrhea, unspecified: Secondary | ICD-10-CM

## 2022-05-07 LAB — URINALYSIS, ROUTINE W REFLEX MICROSCOPIC
Bilirubin Urine: NEGATIVE
Glucose, UA: NEGATIVE mg/dL
Hgb urine dipstick: NEGATIVE
Ketones, ur: NEGATIVE mg/dL
Leukocytes,Ua: NEGATIVE
Nitrite: NEGATIVE
Protein, ur: NEGATIVE mg/dL
Specific Gravity, Urine: 1.011 (ref 1.005–1.030)
pH: 6 (ref 5.0–8.0)

## 2022-05-07 LAB — COMPREHENSIVE METABOLIC PANEL
ALT: 19 U/L (ref 0–44)
AST: 27 U/L (ref 15–41)
Albumin: 4.3 g/dL (ref 3.5–5.0)
Alkaline Phosphatase: 78 U/L (ref 38–126)
Anion gap: 8 (ref 5–15)
BUN: 10 mg/dL (ref 6–20)
CO2: 28 mmol/L (ref 22–32)
Calcium: 9.4 mg/dL (ref 8.9–10.3)
Chloride: 100 mmol/L (ref 98–111)
Creatinine, Ser: 0.76 mg/dL (ref 0.44–1.00)
GFR, Estimated: >60 mL/min (ref >60)
Glucose, Bld: 107 mg/dL — ABNORMAL HIGH (ref 70–99)
Potassium: 3.8 mmol/L (ref 3.5–5.1)
Sodium: 136 mmol/L (ref 135–145)
Total Bilirubin: 0.8 mg/dL (ref 0.3–1.2)
Total Protein: 7.2 g/dL (ref 6.5–8.1)

## 2022-05-07 LAB — CBC
HCT: 43.2 % (ref 36.0–46.0)
Hemoglobin: 14.6 g/dL (ref 12.0–15.0)
MCH: 32.2 pg (ref 26.0–34.0)
MCHC: 33.8 g/dL (ref 30.0–36.0)
MCV: 95.2 fL (ref 80.0–100.0)
Platelets: 329 10*3/uL (ref 150–400)
RBC: 4.54 MIL/uL (ref 3.87–5.11)
RDW: 13.1 % (ref 11.5–15.5)
WBC: 10.3 10*3/uL (ref 4.0–10.5)
nRBC: 0 % (ref 0.0–0.2)

## 2022-05-07 LAB — LIPASE, BLOOD: Lipase: 36 U/L (ref 11–51)

## 2022-05-07 MED ORDER — SUCRALFATE 1 G PO TABS: 1.0000 g | ORAL_TABLET | Freq: Three times a day (TID) | ORAL | 0 refills | Status: DC | PRN

## 2022-05-07 MED ORDER — METOCLOPRAMIDE HCL 5 MG/ML IJ SOLN
10.0000 mg | Freq: Once | INTRAMUSCULAR | Status: AC
  Administered 2022-05-07: 10 mg via INTRAVENOUS
  Filled 2022-05-07: qty 2

## 2022-05-07 MED ORDER — PANTOPRAZOLE SODIUM 40 MG IV SOLR
40.0000 mg | Freq: Once | INTRAVENOUS | Status: AC
  Administered 2022-05-07: 40 mg via INTRAVENOUS
  Filled 2022-05-07: qty 10

## 2022-05-07 MED ORDER — PANTOPRAZOLE SODIUM 40 MG PO TBEC: 40.0000 mg | DELAYED_RELEASE_TABLET | Freq: Every day | ORAL | 1 refills | Status: DC

## 2022-05-07 MED ORDER — SODIUM CHLORIDE 0.9 % IV BOLUS
1000.0000 mL | Freq: Once | INTRAVENOUS | Status: AC
  Administered 2022-05-07: 1000 mL via INTRAVENOUS

## 2022-05-07 MED ORDER — MORPHINE SULFATE (PF) 4 MG/ML IV SOLN
4.0000 mg | INTRAVENOUS | Status: DC | PRN
  Administered 2022-05-07: 4 mg via INTRAVENOUS
  Filled 2022-05-07: qty 1

## 2022-05-07 MED ORDER — SUCRALFATE 1 G PO TABS
1.0000 g | ORAL_TABLET | Freq: Once | ORAL | Status: AC
  Administered 2022-05-07: 1 g via ORAL
  Filled 2022-05-07: qty 1

## 2022-05-07 NOTE — Progress Notes (Signed)
Patient ID: [name], female    DOB: November 04, 1965, 57 y.o.   MRN: [id number]  PCP: [name], PA-C  Chief Complaint  Patient presents with   Abdominal Pain   Bloated   Stool Color Change    Pt states stool looks like coffee ground    Subjective:   [name] is a 57 y.o. female, presents to clinic with CC of the following:  HPI  Pt with worsening abd pain, persistent nausea, vomiting is a little better tolerating fluids only apple juice coffee broth now GI sx reported at 3 weeks and now stool is black x 3 days with worse abd pain she's every had hx of obstruction, appenticitis, gastroparesis Last done 3 d ago were reassuring, pt sx worse, she has stopped linzess In the last 24 h she estimates 30 BM, worsening with generalized weakness and a little lightheadedness when she stands up, but no SOB, CP  Patient Active Problem List   Diagnosis Date Noted   Mixed hyperlipidemia 12/09/2021   Polyp of transverse colon    Gastric outlet obstruction 03/20/2020   Chronic obstructive pulmonary disease ([place]) 06/14/2019   Tobacco use 06/14/2019   Traumatic brain injury with loss of consciousness ([place]) 05/08/2019   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[place]) 05/08/2019   Intractable 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 without status migrainosus 04/06/2019   Hypertension    Chronic neck pain    FH: colon polyps 11/15/2016   Other dysphagia 11/15/2016   Other constipation 11/15/2016   PTSD (post-traumatic stress disorder) 05/18/2016   Breast mass, left 01/08/2016   Chronic pain 01/08/2016   GERD (gastroesophageal reflux disease) 01/08/2016   Difficulty sleeping 08/05/2015      Current Outpatient Medications:    albuterol (VENTOLIN HFA) 108 (90 Base) MCG/ACT inhaler, INHALE 2 PUFFS BY MOUTH EVERY 4 TO 6 HOURS AS NEEDED, Disp: 8.5 each, Rfl: 3   butalbital-acetaminophen-caffeine (FIORICET) 50-325-40 MG tablet, Take 1 tablet by mouth every 6 (six) hours as needed  (refractory headaches)., Disp: 20 tablet, Rfl: 0   carisoprodol (SOMA) 350 MG tablet, Take 700 mg by mouth 2 (two) times daily., Disp: , Rfl:    desvenlafaxine (PRISTIQ) 100 MG 24 hr tablet, Take 1 tablet (100 mg total) by mouth daily., Disp: 90 tablet, Rfl: 3   LINZESS 290 MCG CAPS capsule, TAKE 1 CAPSULE BY MOUTH DAILY BEFORE BREAKFAST., Disp: 90 capsule, Rfl: 3   lisinopril-hydrochlorothiazide (ZESTORETIC) 20-12.5 MG tablet, Take 1 tablet by mouth daily., Disp: 90 tablet, Rfl: 3   ondansetron (ZOFRAN) 4 MG tablet, Take 1 tablet (4 mg total) by mouth every 8 (eight) hours as needed for nausea or vomiting., Disp: 20 tablet, Rfl: 0   oxyCODONE (OXY IR/ROXICODONE) 5 MG immediate release tablet, Take 5 mg by mouth 2 (two) times daily., Disp: , Rfl:    XTAMPZA ER 9 MG C12A, Take 1 capsule by mouth every 12 (twelve) hours., Disp: , Rfl:    Allergies  Allergen Reactions   Contrast Media [Iodinated Contrast Media]    Diphenhydramine Hcl Hives and Itching   Adhesive [Tape]     PAPER TAPE OK   Iodine Other (See Comments)   Prednisone Anxiety   Sulfa Antibiotics Rash     Social History   Tobacco Use   Smoking status: Every Day    Packs/day: 1.00    Types: Cigarettes   Smokeless tobacco: Never  Vaping Use   Vaping Use: Every day  Substances: CBD  Substance Use Topics   Alcohol use: No   Drug use: Yes    Types: Marijuana      Chart Review Today: I personally reviewed active problem list, medication list, allergies, family history, social history, health maintenance, notes from last encounter, lab results, imaging with the patient/caregiver today.   Review of Systems  Constitutional:  Positive for fatigue. Negative for fever.  HENT: Negative.    Eyes: Negative.   Respiratory: Negative.  Negative for shortness of breath.   Cardiovascular: Negative.  Negative for chest pain.  Gastrointestinal:  Positive for abdominal pain, blood in stool, diarrhea, nausea, rectal pain and  vomiting.  Endocrine: Negative.   Genitourinary: Negative.  Negative for difficulty urinating and hematuria.  Musculoskeletal: Negative.   Skin: Negative.   Allergic/Immunologic: Negative.   Neurological:  Positive for weakness and light-headedness.  Hematological: Negative.   Psychiatric/Behavioral: Negative.    All other systems reviewed and are negative.      Objective:   Vitals:   05/07/22 0922 05/07/22 0948  BP: 106/70   Pulse: (!) 112 90  Resp: 16   Temp: 98 F (36.7 C)   TempSrc: Oral   SpO2: 96% 98%  Weight: 136 lb (61.7 kg)   Height: '5\' 8"'$  (1.727 m)     Body mass index is 20.68 kg/m.  Physical Exam Vitals and nursing note reviewed.  Constitutional:      General: She is not in acute distress.    Appearance: She is well-developed. She is not ill-appearing, toxic-appearing or diaphoretic.  HENT:     Head: Normocephalic and atraumatic.     Right Ear: External ear normal.     Left Ear: External ear normal.     Nose: Nose normal.     Mouth/Throat:     Lips: Pink.     Mouth: Mucous membranes are moist.     Pharynx: Oropharynx is clear. Uvula midline.  Eyes:     General: No scleral icterus.       Right eye: No discharge.        Left eye: No discharge.     Conjunctiva/sclera: Conjunctivae normal.     Comments: No palpebral pallor  Neck:     Trachea: No tracheal deviation.  Cardiovascular:     Rate and Rhythm: Normal rate and regular rhythm.     Chest Wall: PMI is not displaced.     Pulses: Normal pulses.          Radial pulses are 2+ on the right side and 2+ on the left side.     Heart sounds: Normal heart sounds. No murmur heard.    No friction rub. No gallop.  Pulmonary:     Effort: Pulmonary effort is normal. No respiratory distress.     Breath sounds: No stridor.  Abdominal:     Tenderness: There is generalized abdominal tenderness.  Musculoskeletal:        General: Normal range of motion.  Skin:    General: Skin is warm and dry.     Capillary  Refill: Capillary refill takes less than 2 seconds.     Coloration: Skin is pale (slightly pale). Skin is not cyanotic or mottled.     Findings: No rash.  Neurological:     Mental Status: She is alert.     Motor: No abnormal muscle tone.     Coordination: Coordination normal.  Psychiatric:        Behavior: Behavior normal.  Results for orders placed or performed in visit on 05/04/22  CBC with Differential/Platelet  Result Value Ref Range   WBC 12.8 (H) 3.8 - 10.8 Thousand/uL   RBC 4.82 3.80 - 5.10 Million/uL   Hemoglobin 15.4 11.7 - 15.5 g/dL   HCT 45.4 (H) 35.0 - 45.0 %   MCV 94.2 80.0 - 100.0 fL   MCH 32.0 27.0 - 33.0 pg   MCHC 33.9 32.0 - 36.0 g/dL   RDW 12.6 11.0 - 15.0 %   Platelets 372 140 - 400 Thousand/uL   MPV 10.6 7.5 - 12.5 fL   Neutro Abs 8,653 (H) 1,500 - 7,800 cells/uL   Lymphs Abs 3,277 850 - 3,900 cells/uL   Absolute Monocytes 806 200 - 950 cells/uL   Eosinophils Absolute 38 15 - 500 cells/uL   Basophils Absolute 26 0 - 200 cells/uL   Neutrophils Relative % 67.6 %   Total Lymphocyte 25.6 %   Monocytes Relative 6.3 %   Eosinophils Relative 0.3 %   Basophils Relative 0.2 %  COMPLETE METABOLIC PANEL WITH GFR  Result Value Ref Range   Glucose, Bld 98 65 - 99 mg/dL   BUN 9 7 - 25 mg/dL   Creat 0.72 0.50 - 1.03 mg/dL   eGFR 99 > OR = 60 mL/min/1.81m   BUN/Creatinine Ratio SEE NOTE: 6 - 22 (calc)   Sodium 140 135 - 146 mmol/L   Potassium 4.5 3.5 - 5.3 mmol/L   Chloride 101 98 - 110 mmol/L   CO2 28 20 - 32 mmol/L   Calcium 10.4 8.6 - 10.4 mg/dL   Total Protein 7.5 6.1 - 8.1 g/dL   Albumin 4.8 3.6 - 5.1 g/dL   Globulin 2.7 1.9 - 3.7 g/dL (calc)   AG Ratio 1.8 1.0 - 2.5 (calc)   Total Bilirubin 0.4 0.2 - 1.2 mg/dL   Alkaline phosphatase (APISO) 94 37 - 153 U/L   AST 19 10 - 35 U/L   ALT 15 6 - 29 U/L  Lipase  Result Value Ref Range   Lipase 18 7 - 60 U/L       Assessment & Plan:   1. Melena Onset 3 d ago after last OV - she was previously  told to go to er with worsening abd pain- will need to go to ED now with her history - no BM to test, she did not want DRE to do hemoccult  2. Generalized abdominal pain Worse pain   3. Nausea, vomiting and diarrhea Vomiting has improved slightly with zofran, but still nausea and worse frequency of diarrhea with new melena/black stool  4. Gastric outlet obstruction Hx of  5. History of small bowel obstruction Hx of   Pt sent to ER for more emergent eval Seen 3 d ago in office, labs then were reassuring, pt has change in sx, worse pain, more freq diarrhea, black stool with horrific odor she reports - concern for GI bleed At rest pulse is normal, though she is easily tachycardic, otherwise BP good and she drove herself here, denies syncope episodes - wishes to take herself to the ED rather than have EMS -  Report to be called to ED    LDelsa Grana PA-C 05/07/22 9:38 AM

## 2022-05-07 NOTE — Progress Notes (Signed)
Pt with worsening abd pain, persistent nausea, vomiting is a little better tolerating fluids only apple juice coffee broth now GI sx reported at 3 weeks and now stool is black x 3 days with worse abd pain she's every had hx of obstruction, appenticitis, gastroparesis Last done 3 d ago were reassuring, pt sx worse, she has stopped linzess In the last 24 h she estimates 30 BM, worsening with generalized weakness and a little lightheadedness when she stands up, but no SOB, CP

## 2022-05-07 NOTE — Patient Instructions (Signed)
Go directly to the ER for evaluation of your severe abdominal pain, black stools, nausea, vomiting, diarrhea with your history of bowel obstruction and gastroparesis.

## 2022-05-07 NOTE — ED Provider Notes (Signed)
[facility] Provider Note    Event Date/Time   First MD Initiated Contact with Patient 05/07/22 1108     (approximate)   History   Abdominal Pain   HPI  [name] is a 57 y.o. female history of bowel obstruction, GERD dysphagia presents to the ER for evaluation of epigastric pain nausea vomiting and some darker stools.  No bright red blood per rectum.  She not been taking any NSAIDs or BC Goody powders.  Feels similar to when she previously had obstruction.  Denies any chest pain or shortness of breath.  Feels like her stools are little bit darker and look like coffee grounds but no coffee-ground emesis no melena.     Physical Exam   Triage Vital Signs: ED Triage Vitals  Enc Vitals Group     BP 05/07/22 1027 128/85     Pulse Rate 05/07/22 1027 99     Resp 05/07/22 1027 17     Temp 05/07/22 1027 98 F (36.7 C)     Temp src --      SpO2 05/07/22 1027 96 %     Weight --      Height --      Head Circumference --      Peak Flow --      Pain Score 05/07/22 1026 8     Pain Loc --      Pain Edu? --      Excl. in [place]? --     Most recent vital signs: Vitals:   05/07/22 1027 05/07/22 1144  BP: 128/85   Pulse: 99 73  Resp: 17 16  Temp: 98 F (36.7 C)   SpO2: 96% 97%     Constitutional: Alert  Eyes: Conjunctivae are normal.  Head: Atraumatic. Nose: No congestion/rhinnorhea. Mouth/Throat: Mucous membranes are moist.   Neck: Painless ROM.  Cardiovascular:   Good peripheral circulation. Respiratory: Normal respiratory effort.  No retractions.  Gastrointestinal: Soft with mild epigastric tenderness palpation. Musculoskeletal:  no deformity Neurologic:  MAE spontaneously. No gross focal neurologic deficits are appreciated.  Skin:  Skin is warm, dry and intact. No rash noted. Psychiatric: Mood and affect are normal. Speech and behavior are normal.    ED Results / Procedures / Treatments   Labs (all labs ordered are listed, but only  abnormal results are displayed) Labs Reviewed  COMPREHENSIVE METABOLIC PANEL - Abnormal; Notable for the following components:      Result Value   Glucose, Bld 107 (*)    All other components within normal limits  URINALYSIS, ROUTINE W REFLEX MICROSCOPIC - Abnormal; Notable for the following components:   Color, Urine YELLOW (*)    APPearance HAZY (*)    All other components within normal limits  LIPASE, BLOOD  CBC     EKG     RADIOLOGY Please see ED Course for my review and interpretation.  I personally reviewed all radiographic images ordered to evaluate for the above acute complaints and reviewed radiology reports and findings.  These findings were personally discussed with the patient.  Please see medical record for radiology report.    PROCEDURES:  Critical Care performed: No  Procedures   MEDICATIONS ORDERED IN ED: Medications  morphine (PF) 4 MG/ML injection 4 mg (4 mg Intravenous Given 05/07/22 1142)  metoCLOPramide (REGLAN) injection 10 mg (10 mg Intravenous Given 05/07/22 1136)  sodium chloride 0.9 % bolus 1,000 mL (0 mLs Intravenous Stopped 05/07/22 1239)  pantoprazole (PROTONIX) injection 40  mg (40 mg Intravenous Given 05/07/22 1139)  sucralfate (CARAFATE) tablet 1 g (1 g Oral Given 05/07/22 1238)     IMPRESSION / MDM / ASSESSMENT AND PLAN / ED COURSE  I reviewed the triage vital signs and the nursing notes.                              Differential diagnosis includes, but is not limited to, GERD reflux, esophagitis, pancreatitis, SBO, enteritis, diverticulitis, PUD  Patient presenting to the ER for evaluation of symptoms as described above.  Based on symptoms, risk factors and considered above differential, this presenting complaint could reflect a potentially life-threatening illness therefore the patient will be placed on continuous pulse oximetry and telemetry for monitoring.  Laboratory evaluation will be sent to evaluate for the above complaints.  Will order  IV morphine as well as IV fluids and IV antiemetic for the patient's pain.  She is nontoxic-appearing given duration of symptoms have a lower suspicion for acute GI bleed particular with normal hemoglobin and otherwise reassuring blood work.  Will order CTs imaging due to concern for above differential will give Protonix is have higher suspicion for mild gastritis possible enteritis will reassess.   Clinical Course as of 05/07/22 1306  Fri May 07, 2022  1217 CT imaging on my review and interpretation without evidence of obstruction. [PR]  1304 Patient reassessed.  She is tolerating p.o. without any vomiting.  Is not consistent with bowel obstruction possible mild enteritis given findings on CT.  Put on Protonix as well as Carafate for probable gastritis.  We discussed strict return precautions.  Patient agreeable to plan. [PR]    Clinical Course User Index [PR] Merlyn Lot, MD    FINAL CLINICAL IMPRESSION(S) / ED DIAGNOSES   Final diagnoses:  Epigastric pain     Rx / DC Orders   ED Discharge Orders          Ordered    pantoprazole (PROTONIX) 40 MG tablet  Daily        05/07/22 1303    sucralfate (CARAFATE) 1 g tablet  3 times daily PRN        05/07/22 1303             Note:  This document was prepared using Dragon voice recognition software and may include unintentional dictation errors.    Merlyn Lot, MD 05/07/22 [phone]

## 2022-05-07 NOTE — Discharge Instructions (Signed)

## 2022-05-07 NOTE — ED Triage Notes (Signed)
Pt comes with c/o belly pain for about 2 weeks now. Pt states it hurts all over. Pt states it is twisting in nature. Pt states she is now having dark stools. Pt states hx of bowel obstruction. Pt denies any blood thinners.

## 2022-05-10 ENCOUNTER — Telehealth: Payer: Self-pay | Admitting: Gastroenterology

## 2022-05-10 NOTE — Telephone Encounter (Signed)
Pt is requesting sooner appointment than May because of black stools over the weekend pt would like call back

## 2022-05-11 NOTE — Telephone Encounter (Signed)
Called patient but had to leave her a voicemail. I rescheduled her appointment to a sooner date. I sent her a patient message as well letting  her know.

## 2022-05-21 ENCOUNTER — Ambulatory Visit (INDEPENDENT_AMBULATORY_CARE_PROVIDER_SITE_OTHER): Payer: 59

## 2022-05-21 ENCOUNTER — Other Ambulatory Visit: Payer: Self-pay | Admitting: Family Medicine

## 2022-05-21 VITALS — BP 120/80 | Ht 67.0 in | Wt 134.0 lb

## 2022-05-21 DIAGNOSIS — Z0000 Encounter for general adult medical examination without abnormal findings: Secondary | ICD-10-CM

## 2022-05-21 DIAGNOSIS — R112 Nausea with vomiting, unspecified: Secondary | ICD-10-CM

## 2022-05-21 DIAGNOSIS — R109 Unspecified abdominal pain: Secondary | ICD-10-CM

## 2022-05-21 DIAGNOSIS — F32A Depression, unspecified: Secondary | ICD-10-CM

## 2022-05-21 MED ORDER — SUCRALFATE 1 G PO TABS: 1.0000 g | ORAL_TABLET | Freq: Three times a day (TID) | ORAL | 2 refills | Status: DC | PRN

## 2022-05-21 MED ORDER — ONDANSETRON HCL 4 MG PO TABS: 4.0000 mg | ORAL_TABLET | Freq: Three times a day (TID) | ORAL | 0 refills | Status: DC | PRN

## 2022-05-21 MED ORDER — PANTOPRAZOLE SODIUM 40 MG PO TBEC: 40.0000 mg | DELAYED_RELEASE_TABLET | Freq: Every day | ORAL | 1 refills | Status: DC

## 2022-05-21 NOTE — Progress Notes (Signed)
Pt needs refill on meds from ER and prior OV, she is still pending GI f/up appt in mid April Meds refilled

## 2022-05-21 NOTE — Progress Notes (Signed)
Subjective:   [name] is a 57 y.o. female who presents for Medicare Annual (Subsequent) preventive examination.  Review of Systems    Cardiac Risk Factors include: dyslipidemia;hypertension     Objective:    Today's Vitals   05/21/22 1013 05/21/22 1016  BP: 120/80   Weight: 134 lb (60.8 kg)   Height: 5\' 7"  (1.702 m)   PainSc:  7    Body mass index is 20.99 kg/m.     05/21/2022   10:33 AM 06/13/2020    8:09 AM 05/01/2020    2:22 PM 01/30/2019   10:37 AM 01/24/2019    2:00 PM 09/20/2018    2:26 PM 02/17/2018    1:22 PM  Advanced Directives  Does Patient Have a Medical Advance Directive? Yes No No No Yes No No  Type of Primary school teacher of [place];Living will [place];Living will    Does patient want to make changes to medical advance directive?    No - Patient declined No - Patient declined    Copy of [place] in Chart?    No - copy requested No - copy requested    Would patient like information on creating a medical advance directive?   No - Patient declined No - Patient declined       Current Medications (verified) Outpatient Encounter Medications as of 05/21/2022  Medication Sig   albuterol (VENTOLIN HFA) 108 (90 Base) MCG/ACT inhaler INHALE 2 PUFFS BY MOUTH EVERY 4 TO 6 HOURS AS NEEDED   butalbital-acetaminophen-caffeine (FIORICET) 50-325-40 MG tablet Take 1 tablet by mouth every 6 (six) hours as needed (refractory headaches).   carisoprodol (SOMA) 350 MG tablet Take 700 mg by mouth 2 (two) times daily.   desvenlafaxine (PRISTIQ) 100 MG 24 hr tablet Take 1 tablet (100 mg total) by mouth daily.   LINZESS 290 MCG CAPS capsule TAKE 1 CAPSULE BY MOUTH DAILY BEFORE BREAKFAST.   lisinopril-hydrochlorothiazide (ZESTORETIC) 20-12.5 MG tablet Take 1 tablet by mouth daily.   ondansetron (ZOFRAN) 4 MG tablet Take 1 tablet (4 mg total) by mouth every 8 (eight) hours as needed for nausea or  vomiting.   oxyCODONE (OXY IR/ROXICODONE) 5 MG immediate release tablet Take 5 mg by mouth 2 (two) times daily. (Patient not taking: Reported on 05/21/2022)   pantoprazole (PROTONIX) 40 MG tablet Take 1 tablet (40 mg total) by mouth daily.   sucralfate (CARAFATE) 1 g tablet Take 1 tablet (1 g total) by mouth 3 (three) times daily as needed.   XTAMPZA ER 9 MG C12A Take 1 capsule by mouth every 12 (twelve) hours.   No facility-administered encounter medications on file as of 05/21/2022.    Allergies (verified) Contrast media [iodinated contrast media], Diphenhydramine hcl, Adhesive [tape], Iodine, Prednisone, and Sulfa antibiotics   History: Past Medical History:  Diagnosis Date   BRBPR (bright red blood per rectum)    Breast mass, left    Cancer (HCC)    CERVICAL   Crushing injury of neck 09/27/2011   Depression    Falls    EASILY   GERD (gastroesophageal reflux disease)    Head injury 05/06/2011   History of subarachnoid hemorrhage 05/07/2019   Formatting of this note might be different from the original. December 2020   PTSD (post-traumatic stress disorder)    PTSD (post-traumatic stress disorder)    S/P laparoscopic appendectomy 02/15/2019   01/24/2019, for distal SBO, and nl appendix.  SAH (subarachnoid hemorrhage) ([place]) 04/06/2019   SBO (small bowel obstruction) ([place]) 05/01/2020   Seizures (HCC)    Shoulder disorder    LIMITED USE OF LEFT SHOULDER AND ARM   TBI (traumatic brain injury) ([place])    SKULL FX 2009   Vertigo    Vertigo    Past Surgical History:  Procedure Laterality Date   abd pain     APPENDECTOMY     BACK SURGERY     LUMBAR FUSIONS X 2   CATARACT EXTRACTION W/PHACO Right 01/07/2015   Procedure: CATARACT EXTRACTION PHACO AND INTRAOCULAR LENS PLACEMENT ([place]);  Surgeon: Birder Robson, MD;  Location: [facility] ORS;  Service: Ophthalmology;  Laterality: Right;  [place] 0 AP% 0 CDE 0 fluid pack lot [id number]   CATARACT EXTRACTION W/PHACO Left 01/28/2015   Procedure:  CATARACT EXTRACTION PHACO AND INTRAOCULAR LENS PLACEMENT (I[facility]);  Surgeon: Birder Robson, MD;  Location: [facility] ORS;  Service: Ophthalmology;  Laterality: Left;  [place] 00:18 AP%7.1 CDE1.32 fluid pack lot # [id number]   Cerebral Catheterization for Brain Bleed  02/02/2019   [facility]   COLONOSCOPY WITH PROPOFOL N/A 05/07/2020   Procedure: COLONOSCOPY WITH PROPOFOL;  Surgeon: Lin Landsman, MD;  Location: [facility] ENDOSCOPY;  Service: Gastroenterology;  Laterality: N/A;   COLONOSCOPY WITH PROPOFOL N/A 06/13/2020   Procedure: COLONOSCOPY WITH PROPOFOL;  Surgeon: [name], MD;  Location: [facility] ENDOSCOPY;  Service: Endoscopy;  Laterality: N/A;   ESOPHAGOGASTRODUODENOSCOPY (EGD) WITH PROPOFOL N/A 05/07/2020   Procedure: ESOPHAGOGASTRODUODENOSCOPY (EGD) WITH PROPOFOL;  Surgeon: Lin Landsman, MD;  Location: [facility] ENDOSCOPY;  Service: Gastroenterology;  Laterality: N/A;   ESOPHAGOGASTRODUODENOSCOPY (EGD) WITH PROPOFOL N/A 06/13/2020   Procedure: ESOPHAGOGASTRODUODENOSCOPY (EGD) WITH PROPOFOL;  Surgeon: [name], MD;  Location: [facility]COPY;  Service: Endoscopy;  Laterality: N/A;   LEEP     XI ROBOT ABDOMINAL PERINEAL RESECTION N/A 01/24/2019   Procedure: Robot assisted appendectomy ;  Surgeon: Ronny Bacon, MD;  Location: [facility] ORS;  Service: General;  Laterality: N/A;   Family History  Problem Relation Age of Onset   Ovarian cancer Maternal Aunt        63's   Depression Mother    Heart disease Father    Hypertension Father    Diabetes Father    Post-traumatic stress disorder Sister    Hypertension Sister    Diabetes Brother    Depression Brother    Hypertension Brother    Social History   Socioeconomic History   Marital status: Divorced    Spouse name: Not on file   Number of children: 1   Years of education: Not on file   Highest education level: Associate degree: occupational, Hotel manager, or vocational program  Occupational History    Comment: DISABLED  Tobacco Use    Smoking status: Every Day    Packs/day: 1    Types: Cigarettes   Smokeless tobacco: Never  Vaping Use   Vaping Use: Every day   Substances: CBD  Substance and Sexual Activity   Alcohol use: No   Drug use: Yes    Types: Marijuana   Sexual activity: Never  Other Topics Concern   Not on file  Social History Narrative   Not on file   Social Determinants of Health   Financial Resource Strain: Medium Risk (05/21/2022)   Overall Financial Resource Strain (CARDIA)    Difficulty of Paying Living Expenses: Somewhat hard  Food Insecurity: No Food Insecurity (05/21/2022)   Hunger Vital Sign    Worried About Running Out of Food  in the Last Year: Never true    [place] in the Last Year: Never true  Transportation Needs: No Transportation Needs (05/21/2022)   PRAPARE - Hydrologist (Medical): No    Lack of Transportation (Non-Medical): No  Physical Activity: Inactive (05/21/2022)   Exercise Vital Sign    Days of Exercise per Week: 0 days    Minutes of Exercise per Session: 0 min  Stress: Stress Concern Present (05/21/2022)   [place]    Feeling of Stress : To some extent  Social Connections: Socially Isolated (05/21/2022)   Social Connection and Isolation Panel [NHANES]    Frequency of Communication with Friends and Family: More than three times a week    Frequency of Social Gatherings with Friends and Family: Never    Attends Religious Services: Never    Marine scientist or Organizations: No    Attends Music therapist: Never    Marital Status: Divorced    Tobacco Counseling Ready to quit: Not Answered Counseling given: Not Answered   Clinical Intake:  Pre-visit preparation completed: Yes  Pain : 0-10 Pain Score: 7  Pain Type: Chronic pain Pain Location: Generalized Pain Descriptors / Indicators: Aching Pain Onset: More than a month ago Pain Frequency:  Constant Pain Relieving Factors: medication  Pain Relieving Factors: medication  BMI - recorded: 20.99 Nutritional Status: BMI of 19-24  Normal Nutritional Risks: Nausea/ vomitting/ diarrhea Diabetes: No  How often do you need to have someone help you when you read instructions, pamphlets, or other written materials from your doctor or pharmacy?: 1 - Never  Diabetic?no  Interpreter Needed?: No  Comments: lives alone with 2 dogs Information entered by :: B.Demarian Epps,LPN   Activities of Daily Living    05/21/2022   10:34 AM 05/07/2022    9:21 AM  In your present state of health, do you have any difficulty performing the following activities:  Hearing? 0 0  Vision? 0 0  Difficulty concentrating or making decisions? 1 1  Walking or climbing stairs? 0 0  Dressing or bathing? 0 0  Doing errands, shopping? 0 0  Preparing Food and eating ? N   Using the Toilet? N   In the past six months, have you accidently leaked urine? N   Do you have problems with loss of bowel control? Y   Managing your Medications? N   Managing your Finances? N     Patient Care Team: [name], PA-C as PCP - General (Family Medicine) Agapito Games, MD as Referring Physician (Neurosurgery) Carolin Guernsey, PA-C (Neurosurgery)  Indicate any recent Medical Services you may have received from other than [facility] providers in the past year (date may be approximate).     Assessment:   This is a routine wellness examination for [facility].  Hearing/Vision screen Hearing Screening - Comments:: Adequate hearing Vision Screening - Comments:: Adequate vision w/glasses Dr Lu Duffel  Dietary issues and exercise activities discussed: Current Exercise Habits: The patient does not participate in regular exercise at present, Exercise limited by: psychological condition(s);neurologic condition(s)   Goals Addressed   None    Depression Screen    05/21/2022   10:27 AM 05/07/2022    9:21 AM 05/04/2022    8:58 AM  12/24/2021    8:48 AM 12/16/2021    1:31 PM 12/10/2021    8:44 AM 10/06/2021    9:03 AM  PHQ 2/9 Scores  PHQ - 2 Score 6 6 6 4 4 4 2   PHQ- 9 Score 17 17 20 8 8 10 3     Fall Risk    05/21/2022   10:23 AM 05/07/2022    9:20 AM 05/04/2022    8:57 AM 12/24/2021    8:46 AM 12/16/2021    1:31 PM  Fall Risk   Falls in the past year? 1 1 1 1 1   Number falls in past yr: 0 1 1 1 1   Injury with Fall? 0 1 1 1 1   Risk for fall due to : No Fall Risks Impaired balance/gait Impaired balance/gait;History of fall(s) Impaired balance/gait;Impaired mobility Impaired balance/gait;Impaired mobility  Follow up Education provided;Falls prevention discussed Falls prevention discussed;Education provided;Falls evaluation completed Falls prevention discussed;Education provided;Falls evaluation completed Falls prevention discussed;Education provided;Falls evaluation completed Falls prevention discussed;Education provided;Falls evaluation completed    FALL RISK PREVENTION PERTAINING TO THE HOME:  Any stairs in or around the home? Yes  If so, are there any without handrails? Yes  Home free of loose throw rugs in walkways, pet beds, electrical cords, etc? Yes  Adequate lighting in your home to reduce risk of falls? Yes   ASSISTIVE DEVICES UTILIZED TO PREVENT FALLS:  Life alert? No  Use of a cane, walker or w/c? No  Grab bars in the bathroom? No  Shower chair or bench in shower? No  Elevated toilet seat or a handicapped toilet? No   TIMED UP AND GO:  Was the test performed? Yes .  Length of time to ambulate 10 feet: 8 sec.   Gait steady and fast without use of assistive device  Cognitive Function:        05/21/2022   10:45 AM  6CIT Screen  What Year? 0 points  What month? 0 points  What time? 0 points  Count back from 20 0 points  Months in reverse 0 points  Repeat phrase 0 points  Total Score 0 points    Immunizations Immunization History  Administered Date(s) Administered   H1N1 02/09/2008    Influenza, Seasonal, Injecte, Preservative Fre 12/13/2005, 01/20/2007, 11/20/2007, 12/23/2008, 12/29/2009, 12/16/2010, 12/28/2012   PPD Test 09/20/2006   Tdap 09/05/2017    TDAP status: Up to date  Flu Vaccine status: Declined, Education has been provided regarding the importance of this vaccine but patient still declined. Advised may receive this vaccine at local pharmacy or Health Dept. Aware to provide a copy of the vaccination record if obtained from local pharmacy or Health Dept. Verbalized acceptance and understanding.  Pneumococcal vaccine status: Declined,  Education has been provided regarding the importance of this vaccine but patient still declined. Advised may receive this vaccine at local pharmacy or Health Dept. Aware to provide a copy of the vaccination record if obtained from local pharmacy or Health Dept. Verbalized acceptance and understanding.   Covid-19 vaccine status: Declined, Education has been provided regarding the importance of this vaccine but patient still declined. Advised may receive this vaccine at local pharmacy or Health Dept.or vaccine clinic. Aware to provide a copy of the vaccination record if obtained from local pharmacy or Health Dept. Verbalized acceptance and understanding.  Qualifies for Shingles Vaccine? No   Zostavax completed No   Shingrix Completed?: No.    Education has been provided regarding the importance of this vaccine. Patient has been advised to call insurance company to determine out of pocket expense if they have not yet received this vaccine. Advised may also receive vaccine at local pharmacy or  Health Dept. Verbalized acceptance and understanding.  Screening Tests Health Maintenance  Topic Date Due   Lung Cancer Screening  05/04/2023 (Originally 04/13/2017)   Medicare Annual Wellness (AWV)  05/21/2023   PAP SMEAR-Modifier  09/09/2023   MAMMOGRAM  01/16/2024   COLONOSCOPY (Pts 45-85yrs Insurance coverage will need to be confirmed)   06/14/2027   DTaP/Tdap/Td (2 - Td or Tdap) 09/06/2027   Hepatitis C Screening  Completed   HIV Screening  Completed   HPV VACCINES  Aged Out   INFLUENZA VACCINE  Discontinued   COVID-19 Vaccine  Discontinued   Zoster Vaccines- Shingrix  Discontinued    Health Maintenance  There are no preventive care reminders to display for this patient.   Colorectal cancer screening: Type of screening: Colonoscopy. Completed yes. Repeat every 5 years  Mammogram status: Completed yes. Repeat every year  Lung Cancer Screening: (Low Dose CT Chest recommended if Age 3-80 years, 30 pack-year currently smoking OR have quit w/in 15years.) does qualify.   Lung Cancer Screening Referral: no pt declines  Additional Screening:  Hepatitis C Screening: does qualify; Completed yes  Vision Screening: Recommended annual ophthalmology exams for early detection of glaucoma and other disorders of the eye. Is the patient up to date with their annual eye exam?  Yes  Who is the provider or what is the name of the office in which the patient attends annual eye exams? [place] If pt is not established with a provider, would they like to be referred to a provider to establish care? No .   Dental Screening: Recommended annual dental exams for proper oral hygiene  Community Resource Referral / Chronic Care Management: CRR required this visit?  No   CCM required this visit?  No      Plan:     I have personally reviewed and noted the following in the patient's chart:   Medical and social history Use of alcohol, tobacco or illicit drugs  Current medications and supplements including opioid prescriptions. Patient is not currently taking opioid prescriptions. Functional ability and status Nutritional status Physical activity Advanced directives List of other physicians Hospitalizations, surgeries, and ER visits in previous 12 months Vitals Screenings to include cognitive, depression, and  falls Referrals and appointments  In addition, I have reviewed and discussed with patient certain preventive protocols, quality metrics, and best practice recommendations. A written personalized care plan for preventive services as well as general preventive health recommendations were provided to patient.     Roger Shelter, LPN   [id number]   Nurse Notes: pt relays problems with black stools and nausea for 3 weeks. Pt went to ER on 05/07/22, who gave her Carafate and she says test showed nothing wrong. Pt says ER told her to follow up with GI provider. She says she has appt with them on 06/14/2022. *Pt also relays need for new psychiatrist (old one left). Referral to Behavioral Health made *Pt requesting refill of Carafate, Pantoprazole and Zofran

## 2022-05-21 NOTE — Patient Instructions (Signed)
[name] , Thank you for taking time to come for your Medicare Wellness Visit. I appreciate your ongoing commitment to your health goals. Please review the following plan we discussed and let me know if I can assist you in the future.   These are the goals we discussed:  Goals   None     This is a list of the screening recommended for you and due dates:  Health Maintenance  Topic Date Due   Screening for Lung Cancer  05/04/2023*   Medicare Annual Wellness Visit  05/21/2023   Pap Smear  09/09/2023   Mammogram  01/16/2024   Colon Cancer Screening  06/14/2027   DTaP/Tdap/Td vaccine (2 - Td or Tdap) 09/06/2027   Hepatitis C Screening: USPSTF Recommendation to screen - Ages 76-79 yo.  Completed   HIV Screening  Completed   HPV Vaccine  Aged Out   Flu Shot  Discontinued   COVID-19 Vaccine  Discontinued   Zoster (Shingles) Vaccine  Discontinued  *Topic was postponed. The date shown is not the original due date.    Advanced directives: yes  Conditions/risks identified: falls risk  Next appointment: Follow up in one year for your annual wellness visit. 05/24/2023 @10 :45am in person  Preventive Care 40-64 Years, Female Preventive care refers to lifestyle choices and visits with your health care provider that can promote health and wellness. What does preventive care include? A yearly physical exam. This is also called an annual well check. Dental exams once or twice a year. Routine eye exams. Ask your health care provider how often you should have your eyes checked. Personal lifestyle choices, including: Daily care of your teeth and gums. Regular physical activity. Eating a healthy diet. Avoiding tobacco and drug use. Limiting alcohol use. Practicing safe sex. Taking low-dose aspirin daily starting at age 79. Taking vitamin and mineral supplements as recommended by your health care provider. What happens during an annual well check? The services and screenings done by your  health care provider during your annual well check will depend on your age, overall health, lifestyle risk factors, and family history of disease. Counseling  Your health care provider may ask you questions about your: Alcohol use. Tobacco use. Drug use. Emotional well-being. Home and relationship well-being. Sexual activity. Eating habits. Work and work Statistician. Method of birth control. Menstrual cycle. Pregnancy history. Screening  You may have the following tests or measurements: Height, weight, and BMI. Blood pressure. Lipid and cholesterol levels. These may be checked every 5 years, or more frequently if you are over 47 years old. Skin check. Lung cancer screening. You may have this screening every year starting at age 41 if you have a 30-pack-year history of smoking and currently smoke or have quit within the past 15 years. Fecal occult blood test (FOBT) of the stool. You may have this test every year starting at age 3. Flexible sigmoidoscopy or colonoscopy. You may have a sigmoidoscopy every 5 years or a colonoscopy every 10 years starting at age 61. Hepatitis C blood test. Hepatitis B blood test. Sexually transmitted disease (STD) testing. Diabetes screening. This is done by checking your blood sugar (glucose) after you have not eaten for a while (fasting). You may have this done every 1-3 years. Mammogram. This may be done every 1-2 years. Talk to your health care provider about when you should start having regular mammograms. This may depend on whether you have a family history of breast cancer. BRCA-related cancer screening. This may be done  if you have a family history of breast, ovarian, tubal, or peritoneal cancers. Pelvic exam and Pap test. This may be done every 3 years starting at age 44. Starting at age 49, this may be done every 5 years if you have a Pap test in combination with an HPV test. Bone density scan. This is done to screen for osteoporosis. You may have  this scan if you are at high risk for osteoporosis. Discuss your test results, treatment options, and if necessary, the need for more tests with your health care provider. Vaccines  Your health care provider may recommend certain vaccines, such as: Influenza vaccine. This is recommended every year. Tetanus, diphtheria, and acellular pertussis (Tdap, Td) vaccine. You may need a Td booster every 10 years. Zoster vaccine. You may need this after age 29. Pneumococcal 13-valent conjugate (PCV13) vaccine. You may need this if you have certain conditions and were not previously vaccinated. Pneumococcal polysaccharide (PPSV23) vaccine. You may need one or two doses if you smoke cigarettes or if you have certain conditions. Talk to your health care provider about which screenings and vaccines you need and how often you need them. This information is not intended to replace advice given to you by your health care provider. Make sure you discuss any questions you have with your health care provider. Document Released: 03/14/2015 Document Revised: 11/05/2015 Document Reviewed: 12/17/2014 Elsevier Interactive Patient Education  2017 [employer] Prevention in the Home Falls can cause injuries. They can happen to people of all ages. There are many things you can do to make your home safe and to help prevent falls. What can I do on the outside of my home? Regularly fix the edges of walkways and driveways and fix any cracks. Remove anything that might make you trip as you walk through a door, such as a raised step or threshold. Trim any bushes or trees on the path to your home. Use bright outdoor lighting. Clear any walking paths of anything that might make someone trip, such as rocks or tools. Regularly check to see if handrails are loose or broken. Make sure that both sides of any steps have handrails. Any raised decks and porches should have guardrails on the edges. Have any leaves, snow, or  ice cleared regularly. Use sand or salt on walking paths during winter. Clean up any spills in your garage right away. This includes oil or grease spills. What can I do in the bathroom? Use night lights. Install grab bars by the toilet and in the tub and shower. Do not use towel bars as grab bars. Use non-skid mats or decals in the tub or shower. If you need to sit down in the shower, use a plastic, non-slip stool. Keep the floor dry. Clean up any water that spills on the floor as soon as it happens. Remove soap buildup in the tub or shower regularly. Attach bath mats securely with double-sided non-slip rug tape. Do not have throw rugs and other things on the floor that can make you trip. What can I do in the bedroom? Use night lights. Make sure that you have a light by your bed that is easy to reach. Do not use any sheets or blankets that are too big for your bed. They should not hang down onto the floor. Have a firm chair that has side arms. You can use this for support while you get dressed. Do not have throw rugs and other things on the  floor that can make you trip. What can I do in the kitchen? Clean up any spills right away. Avoid walking on wet floors. Keep items that you use a lot in easy-to-reach places. If you need to reach something above you, use a strong step stool that has a grab bar. Keep electrical cords out of the way. Do not use floor polish or wax that makes floors slippery. If you must use wax, use non-skid floor wax. Do not have throw rugs and other things on the floor that can make you trip. What can I do with my stairs? Do not leave any items on the stairs. Make sure that there are handrails on both sides of the stairs and use them. Fix handrails that are broken or loose. Make sure that handrails are as [place] as the stairways. Check any carpeting to make sure that it is firmly attached to the stairs. Fix any carpet that is loose or worn. Avoid having throw rugs at  the top or bottom of the stairs. If you do have throw rugs, attach them to the floor with carpet tape. Make sure that you have a light switch at the top of the stairs and the bottom of the stairs. If you do not have them, ask someone to add them for you. What else can I do to help prevent falls? Wear shoes that: Do not have high heels. Have rubber bottoms. Are comfortable and fit you well. Are closed at the toe. Do not wear sandals. If you use a stepladder: Make sure that it is fully opened. Do not climb a closed stepladder. Make sure that both sides of the stepladder are locked into place. Ask someone to hold it for you, if possible. Clearly mark and make sure that you can see: Any grab bars or handrails. First and last steps. Where the edge of each step is. Use tools that help you move around (mobility aids) if they are needed. These include: Canes. Walkers. Scooters. Crutches. Turn on the lights when you go into a dark area. Replace any light bulbs as soon as they burn out. Set up your furniture so you have a clear path. Avoid moving your furniture around. If any of your floors are uneven, fix them. If there are any pets around you, be aware of where they are. Review your medicines with your doctor. Some medicines can make you feel dizzy. This can increase your chance of falling. Ask your doctor what other things that you can do to help prevent falls. This information is not intended to replace advice given to you by your health care provider. Make sure you discuss any questions you have with your health care provider. Document Released: 12/12/2008 Document Revised: 07/24/2015 Document Reviewed: 03/22/2014 Elsevier Interactive Patient Education  2017 [employer].

## 2022-06-04 ENCOUNTER — Other Ambulatory Visit: Payer: Self-pay | Admitting: Gastroenterology

## 2022-06-07 MED ORDER — LINACLOTIDE 290 MCG PO CAPS: 290.0000 ug | ORAL_CAPSULE | Freq: Every day | ORAL | 2 refills | Status: DC

## 2022-06-14 ENCOUNTER — Ambulatory Visit (INDEPENDENT_AMBULATORY_CARE_PROVIDER_SITE_OTHER): Payer: 59 | Admitting: Gastroenterology

## 2022-06-14 ENCOUNTER — Encounter: Payer: Self-pay | Admitting: Gastroenterology

## 2022-06-14 ENCOUNTER — Other Ambulatory Visit: Payer: Self-pay

## 2022-06-14 VITALS — BP 123/81 | HR 88 | Ht 68.0 in

## 2022-06-14 DIAGNOSIS — K5903 Drug induced constipation: Secondary | ICD-10-CM | POA: Diagnosis not present

## 2022-06-14 DIAGNOSIS — K3184 Gastroparesis: Secondary | ICD-10-CM | POA: Diagnosis not present

## 2022-06-14 DIAGNOSIS — K921 Melena: Secondary | ICD-10-CM | POA: Diagnosis not present

## 2022-06-14 NOTE — Progress Notes (Unsigned)
Wyline Mood MD, MRCP(U.K) [address]  [address]  [place], [place] [address]  Main: [phone]  Fax: [phone]   Primary Care Physician: Danelle Berry, PA-C  Primary Gastroenterologist:  Dr. Wyline Mood   Chief Complaint  Patient presents with   Drug induced constipation   Follow-up    HPI: [name] is a 57 y.o. female  Summary of history :   She is here for a follow-up   for constipation  Initially referred and seen on 03/20/2020 for abdominal pain bloating and constipation  she had been on oxycodone.  History of traumatic brain injury.Cathlean Sauer of the abdomen in November 2021 showed mild to moderate increasing colonic stool burden.  CT scan of the abdomen in December 2021 demonstrated resolution of previously seen small bowel obstruction, moderate constipation.November 2021: TSH normal, hemoglobin 15.8, CMP normal except an elevated calcium and alkaline phosphatase.     She was admitted in March 2022 with abdominal pain nausea vomiting and found to have a small bowel obstruction.  EGD showed an esophageal ulcer with no stigmata of bleeding colonoscopy was attempted but inadequate preparation.She was seen by general surgery.     03/20/2020: H. pylori breath test negative.  PTH normal, GGT 24 normal, fractionated alkaline phosphatase normal.  ANA negative.  AMA negative.  CMP showed isolated elevation of alkaline phosphatase at 179.   05/01/2020 CT abdomen and pelvis without contrast demonstrated findings consistent with partial small bowel obstruction 05/02/2020: Right upper quadrant ultrasound normal  06/13/2020: Colonoscopy : 2 mm polyp resected. EGD: small hiatal hernia.  06/05/2020: MR enterogram : Large burden of stool in colon and rectum . No bowel obstruction.  05/04/2020: CMP: Normal AST, ALT, alkaline phosphatase      Interval history 07/17/2020-06/14/2022   Taking Linzess 290 g a day having regular bowel movements.  On 05/07/2022 was seen in the emergency room with  epigastric discomfort she says she was having black-colored stools.  She underwent a CT scan of the abdomen pelvis without contrast that showed mildly dilated jejunum no obstruction hemoglobin was 14.6 CMP was normal denied any NSAID use.  Commenced on Protonix and Carafate which she continues to take.  She says that the black-colored stools continued after that on and off.  She has a lot of bloating but no abdominal pain.  She is known to have a hiatal hernia.   Current Outpatient Medications  Medication Sig Dispense Refill   albuterol (VENTOLIN HFA) 108 (90 Base) MCG/ACT inhaler INHALE 2 PUFFS BY MOUTH EVERY 4 TO 6 HOURS AS NEEDED 8.5 each 3   carisoprodol (SOMA) 350 MG tablet Take 700 mg by mouth 2 (two) times daily.     desvenlafaxine (PRISTIQ) 100 MG 24 hr tablet Take 1 tablet (100 mg total) by mouth daily. 90 tablet 3   LINZESS 290 MCG CAPS capsule TAKE 1 CAPSULE BY MOUTH EVERY DAY BEFORE BREAKFAST 90 capsule 2   lisinopril-hydrochlorothiazide (ZESTORETIC) 20-12.5 MG tablet Take 1 tablet by mouth daily. 90 tablet 3   ondansetron (ZOFRAN) 4 MG tablet Take 1 tablet (4 mg total) by mouth every 8 (eight) hours as needed for nausea or vomiting. 20 tablet 0   pantoprazole (PROTONIX) 40 MG tablet Take 1 tablet (40 mg total) by mouth daily. 30 tablet 1   sucralfate (CARAFATE) 1 g tablet Take 1 tablet (1 g total) by mouth 3 (three) times daily as needed. 30 tablet 2   No current facility-administered medications for this visit.  Allergies as of 06/14/2022 - Review Complete 05/21/2022  Allergen Reaction Noted   Contrast media [iodinated contrast media]  01/08/2016   Diphenhydramine hcl Hives and Itching 04/15/2015   Adhesive [tape]  01/01/2015   Iodine Other (See Comments) 05/05/2016   Prednisone Anxiety 07/27/2017   Sulfa antibiotics Rash 07/04/2012    ROS:  General: Negative for anorexia, weight loss, fever, chills, fatigue, weakness. ENT: Negative for hoarseness, difficulty swallowing  , nasal congestion. CV: Negative for chest pain, angina, palpitations, dyspnea on exertion, peripheral edema.  Respiratory: Negative for dyspnea at rest, dyspnea on exertion, cough, sputum, wheezing.  GI: See history of present illness. GU:  Negative for dysuria, hematuria, urinary incontinence, urinary frequency, nocturnal urination.  Endo: Negative for unusual weight change.    Physical Examination:   BP 123/81   Pulse 88   Ht 5\' 8"  (1.727 m)   BMI 20.37 kg/m   General: Well-nourished, well-developed in no acute distress.  Eyes: No icterus. Conjunctivae pink. Abdomen: Bowel sounds are normal, nontender, nondistended, no hepatosplenomegaly or masses, no abdominal bruits or hernia , no rebound or guarding.   Extremities: No lower extremity edema. No clubbing or deformities. Neuro: Alert and oriented x 3.  Grossly intact. Skin: Warm and dry, no jaundice.   Psych: Alert and cooperative, normal mood and affect.   Imaging Studies: No results found.  Assessment and Plan:   [name] is a 57 y.o. 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  She is on Protonix and taking Carafate.  Darker stools is concerning for melena.     Plan 1.  Continue Linzess 290 mcg daily Collister-term. 2.  Check CBC 3.  EGD  I have discussed alternative options, risks & benefits,  which include, but are not limited to, bleeding, infection, perforation,respiratory complication & drug reaction.  The patient agrees with this plan & written consent will be obtained.       Dr Wyline Mood  MD,MRCP [facility]) Follow up in as needed

## 2022-06-15 LAB — CBC WITH DIFFERENTIAL/PLATELET
Basophils Absolute: 0 10*3/uL (ref 0.0–0.2)
Basos: 0 %
EOS (ABSOLUTE): 0 10*3/uL (ref 0.0–0.4)
Eos: 0 %
Hematocrit: 41 % (ref 34.0–46.6)
Hemoglobin: 14 g/dL (ref 11.1–15.9)
Immature Grans (Abs): 0.1 10*3/uL (ref 0.0–0.1)
Immature Granulocytes: 1 %
Lymphocytes Absolute: 4.3 10*3/uL — ABNORMAL HIGH (ref 0.7–3.1)
Lymphs: 34 %
MCH: 32 pg (ref 26.6–33.0)
MCHC: 34.1 g/dL (ref 31.5–35.7)
MCV: 94 fL (ref 79–97)
Monocytes Absolute: 1 10*3/uL — ABNORMAL HIGH (ref 0.1–0.9)
Monocytes: 8 %
Neutrophils Absolute: 7.3 10*3/uL — ABNORMAL HIGH (ref 1.4–7.0)
Neutrophils: 57 %
Platelets: 353 10*3/uL (ref 150–450)
RBC: 4.38 x10E6/uL (ref 3.77–5.28)
RDW: 12.9 % (ref 11.7–15.4)
WBC: 12.7 10*3/uL — ABNORMAL HIGH (ref 3.4–10.8)

## 2022-06-22 ENCOUNTER — Encounter: Payer: Self-pay | Admitting: Gastroenterology

## 2022-06-23 ENCOUNTER — Ambulatory Visit: Payer: 59 | Admitting: Anesthesiology

## 2022-06-23 ENCOUNTER — Encounter
Admission: RE | Disposition: A | Discharge status: Home / Self Care | Payer: Self-pay | Source: Home / Self Care | Attending: Gastroenterology

## 2022-06-23 ENCOUNTER — Ambulatory Visit
Admission: RE | Admit: 2022-06-23 | Discharge: 2022-06-23 | Disposition: A | Discharge status: Home / Self Care | Payer: 59 | Attending: Gastroenterology | Admitting: Gastroenterology

## 2022-06-23 ENCOUNTER — Other Ambulatory Visit: Payer: Self-pay

## 2022-06-23 ENCOUNTER — Encounter: Payer: Self-pay | Admitting: Gastroenterology

## 2022-06-23 DIAGNOSIS — K921 Melena: Secondary | ICD-10-CM | POA: Diagnosis not present

## 2022-06-23 HISTORY — PX: ESOPHAGOGASTRODUODENOSCOPY (EGD) WITH PROPOFOL: SHX5813

## 2022-06-23 SURGERY — ESOPHAGOGASTRODUODENOSCOPY (EGD) WITH PROPOFOL
Anesthesia: General

## 2022-06-23 MED ORDER — SODIUM CHLORIDE 0.9 % IV SOLN: INTRAVENOUS | Status: DC

## 2022-06-23 MED ORDER — LIDOCAINE HCL (CARDIAC) PF 100 MG/5ML IV SOSY
PREFILLED_SYRINGE | INTRAVENOUS | Status: DC | PRN
  Administered 2022-06-23: 100 mg via INTRAVENOUS

## 2022-06-23 MED ORDER — PROPOFOL 10 MG/ML IV BOLUS
INTRAVENOUS | Status: DC | PRN
  Administered 2022-06-23: 70 mg via INTRAVENOUS
  Administered 2022-06-23: 20 mg via INTRAVENOUS

## 2022-06-23 MED ORDER — MIDAZOLAM HCL 2 MG/2ML IJ SOLN
INTRAMUSCULAR | Status: AC
  Filled 2022-06-23: qty 2

## 2022-06-23 MED ORDER — PROPOFOL 500 MG/50ML IV EMUL
INTRAVENOUS | Status: DC | PRN
  Administered 2022-06-23: 145 ug/kg/min via INTRAVENOUS

## 2022-06-23 MED ORDER — GLYCOPYRROLATE 0.2 MG/ML IJ SOLN
INTRAMUSCULAR | Status: DC | PRN
  Administered 2022-06-23: .2 mg via INTRAVENOUS

## 2022-06-23 MED ORDER — MIDAZOLAM HCL 2 MG/2ML IJ SOLN
INTRAMUSCULAR | Status: DC | PRN
  Administered 2022-06-23: 2 mg via INTRAVENOUS

## 2022-06-23 NOTE — Op Note (Signed)
[facility] Gastroenterology Patient Name: [name] Procedure Date: 06/23/2022 10:59 AM MRN: [id number] Account #: [id number] Date of Birth: September 27, 1965 Admit Type: Outpatient Age: 57 Room: [facility] ENDO ROOM 3 Gender: Female Note Status: Finalized Instrument Name: Laurette Schimke [id number] Procedure:             Upper GI endoscopy Indications:           Melena Providers:             Wyline Mood MD, MD Referring MD:          Danelle Berry (Referring MD) Medicines:             Monitored Anesthesia Care Complications:         No immediate complications. Procedure:             Pre-Anesthesia Assessment:                        - Prior to the procedure, a History and Physical was                         performed, and patient medications, allergies and                         sensitivities were reviewed. The patient's tolerance                         of previous anesthesia was reviewed.                        - The risks and benefits of the procedure and the                         sedation options and risks were discussed with the                         patient. All questions were answered and informed                         consent was obtained.                        - ASA Grade Assessment: II - A patient with mild                         systemic disease.                        After obtaining informed consent, the endoscope was                         passed under direct vision. Throughout the procedure,                         the patient's blood pressure, pulse, and oxygen                         saturations were monitored continuously. The Endoscope                         was introduced through the mouth,  and advanced to the                         third part of duodenum. The upper GI endoscopy was                         accomplished with ease. The patient tolerated the                         procedure well. Findings:      The esophagus was normal.      The  stomach was normal.      The examined duodenum was normal. Impression:            - Normal esophagus.                        - Normal stomach.                        - Normal examined duodenum.                        - No specimens collected. Recommendation:        - Discharge patient to home (with escort).                        - Resume previous diet.                        - Continue present medications.                        - Return to my office as previously scheduled. Procedure Code(s):     --- Professional ---                        [id number], Esophagogastroduodenoscopy, flexible,                         transoral; diagnostic, including collection of                         specimen(s) by brushing or washing, when performed                         (separate procedure) Diagnosis Code(s):     --- Professional ---                        K92.1, Melena (includes Hematochezia) CPT copyright 2022 American Medical Association. All rights reserved. The codes documented in this report are preliminary and upon coder review may  be revised to meet current compliance requirements. Wyline Mood, MD Wyline Mood MD, MD 06/23/2022 11:13:16 AM This report has been signed electronically. Number of Addenda: 0 Note Initiated On: 06/23/2022 10:59 AM Estimated Blood Loss:  Estimated blood loss: none.      [facility]

## 2022-06-23 NOTE — Transfer of Care (Signed)
Immediate Anesthesia Transfer of Care Note  Patient: [name]  Procedure(s) Performed: ESOPHAGOGASTRODUODENOSCOPY (EGD) WITH PROPOFOL  Patient Location: Endoscopy Unit  Anesthesia Type:General  Level of Consciousness: drowsy and patient cooperative  Airway & Oxygen Therapy: Patient Spontanous Breathing and Patient connected to face mask oxygen  Post-op Assessment: Report given to RN and Post -op Vital signs reviewed and stable  Post vital signs: Reviewed and stable  Last Vitals:  Vitals Value Taken Time  BP    Temp    Pulse 83 06/23/22 1116  Resp    SpO2 100 % 06/23/22 1116  Vitals shown include unvalidated device data.  Last Pain:  Vitals:   06/23/22 1026  TempSrc: Temporal  PainSc: 0-No pain         Complications: No notable events documented.

## 2022-06-23 NOTE — Anesthesia Preprocedure Evaluation (Signed)
Anesthesia Evaluation  Patient identified by MRN, date of birth, ID band Patient awake    Reviewed: Allergy & Precautions, H&P , NPO status , Patient's Chart, lab work & pertinent test results  History of Anesthesia Complications Negative for: history of anesthetic complications  Airway Mallampati: III  TM Distance: >3 FB Neck ROM: Full  Mouth opening: Limited Mouth Opening  Dental no notable dental hx. (+) Teeth Intact,    Pulmonary neg sleep apnea, COPD, Current SmokerPatient did not abstain from smoking.   Pulmonary exam normal breath sounds clear to auscultation       Cardiovascular hypertension, (-) angina (-) Past MI and (-) Cardiac Stents (-) dysrhythmias  Rhythm:regular Rate:Normal - Systolic murmurs    Neuro/Psych  Headaches, Seizures -,  PSYCHIATRIC DISORDERS Anxiety Depression    Last seizure 2 years ago. Hx of "brain bleeds"    GI/Hepatic ,GERD  Controlled,,(+)     substance abuse  marijuana use  Endo/Other  negative endocrine ROS    Renal/GU negative Renal ROS  negative genitourinary   Musculoskeletal   Abdominal   Peds  Hematology negative hematology ROS (+)   Anesthesia Other Findings Past Medical History: No date: BRBPR (bright red blood per rectum) No date: Breast mass, left No date: Cancer (HCC)     Comment:  CERVICAL No date: Depression No date: Falls     Comment:  EASILY No date: GERD (gastroesophageal reflux disease) No date: PTSD (post-traumatic stress disorder) No date: PTSD (post-traumatic stress disorder) No date: Seizures (HCC) No date: Shoulder disorder     Comment:  LIMITED USE OF LEFT SHOULDER AND ARM No date: TBI (traumatic brain injury) (HCC)     Comment:  SKULL FX 2009 No date: Vertigo No date: Vertigo  Past Surgical History: No date: abd pain No date: APPENDECTOMY No date: BACK SURGERY     Comment:  LUMBAR FUSIONS X 2 01/07/2015: CATARACT EXTRACTION W/PHACO; Right      Comment:  Procedure: CATARACT EXTRACTION PHACO AND INTRAOCULAR               LENS PLACEMENT (IOC);  Surgeon: Galen Manila, MD;                Location: [facility] ORS;  Service: Ophthalmology;  Laterality:              Right;  [place] 0 AP% 0 CDE 0 fluid pack lot #[id number] 01/28/2015: CATARACT EXTRACTION W/PHACO; Left     Comment:  Procedure: CATARACT EXTRACTION PHACO AND INTRAOCULAR               LENS PLACEMENT (IOC);  Surgeon: Galen Manila, MD;                Location: [facility] ORS;  Service: Ophthalmology;  Laterality:              Left;  [place] 00:18 AP%7.1 CDE1.32 fluid pack lot #               [id number] 02/02/2019: Cerebral Catheterization for Brain Bleed     Comment:  [facility] 05/07/2020: COLONOSCOPY WITH PROPOFOL; N/A     Comment:  Procedure: COLONOSCOPY WITH PROPOFOL;  Surgeon: Toney Reil, MD;  Location: [facility] ENDOSCOPY;  Service:               Gastroenterology;  Laterality: N/A; 05/07/2020: ESOPHAGOGASTRODUODENOSCOPY (EGD) WITH PROPOFOL; N/A     Comment:  Procedure: ESOPHAGOGASTRODUODENOSCOPY (  EGD) WITH               PROPOFOL;  Surgeon: Toney Reil, MD;  Location:               [facility] ENDOSCOPY;  Service: Gastroenterology;  Laterality:               N/A; No date: LEEP 01/24/2019: XI ROBOT ABDOMINAL PERINEAL RESECTION; N/A     Comment:  Procedure: Robot assisted appendectomy ;  Surgeon:               Campbell Lerner, MD;  Location: [facility] ORS;  Service:               General;  Laterality: N/A;  BMI    Body Mass Index: 20.53 kg/m      Reproductive/Obstetrics negative OB ROS                              Anesthesia Physical Anesthesia Plan  ASA: 3  Anesthesia Plan: General   Post-op Pain Management: Minimal or no pain anticipated   Induction: Intravenous  PONV Risk Score and Plan: 2 and Propofol infusion and TIVA  Airway Management Planned: Nasal Cannula  Additional Equipment: None  Intra-op Plan:    Post-operative Plan:   Informed Consent: I have reviewed the patients History and Physical, chart, labs and discussed the procedure including the risks, benefits and alternatives for the proposed anesthesia with the patient or authorized representative who has indicated his/her understanding and acceptance.     Dental Advisory Given  Plan Discussed with: Anesthesiologist, CRNA and Surgeon  Anesthesia Plan Comments: (Discussed risks of anesthesia with patient, including possibility of difficulty with spontaneous ventilation under anesthesia necessitating airway intervention, PONV, and rare risks such as cardiac or respiratory or neurological events, and allergic reactions. Discussed the role of CRNA in patient's perioperative care. Patient understands. Patient counseled on benefits of smoking cessation, and increased perioperative risks associated with continued smoking. )         Anesthesia Quick Evaluation

## 2022-06-23 NOTE — Anesthesia Postprocedure Evaluation (Signed)
Anesthesia Post Note  Patient: [name]  Procedure(s) Performed: ESOPHAGOGASTRODUODENOSCOPY (EGD) WITH PROPOFOL  Patient location during evaluation: Endoscopy Anesthesia Type: General Level of consciousness: awake and alert Pain management: pain level controlled Vital Signs Assessment: post-procedure vital signs reviewed and stable Respiratory status: spontaneous breathing, nonlabored ventilation, respiratory function stable and patient connected to nasal cannula oxygen Cardiovascular status: blood pressure returned to baseline and stable Postop Assessment: no apparent nausea or vomiting Anesthetic complications: no   No notable events documented.   Last Vitals:  Vitals:   06/23/22 1126 06/23/22 1129  BP:    Pulse:    Resp: 16 16  Temp:    SpO2:      Last Pain:  Vitals:   06/23/22 1129  TempSrc:   PainSc: 0-No pain                 Corinda Gubler

## 2022-06-23 NOTE — H&P (Signed)
Becky Mood, [name] [address], [place], [place], [address] [address], [place], [place], [address] Phone: [phone]  Fax: [phone]  Primary Care Physician:  Danelle Berry, PA-C   Pre-Procedure History & Physical: HPI:  [name] is a 57 y.o. female is here for an endoscopy    Past Medical History:  Diagnosis Date   BRBPR (bright red blood per rectum)    Breast mass, left    Cancer    CERVICAL   Crushing injury of neck 09/27/2011   Depression    Falls    EASILY   GERD (gastroesophageal reflux disease)    Head injury 05/06/2011   History of subarachnoid hemorrhage 05/07/2019   Formatting of this note might be different from the original. December 2020   PTSD (post-traumatic stress disorder)    PTSD (post-traumatic stress disorder)    S/P laparoscopic appendectomy 02/15/2019   01/24/2019, for distal SBO, and nl appendix.    SAH (subarachnoid hemorrhage) 04/06/2019   SBO (small bowel obstruction) 05/01/2020   Seizures    Shoulder disorder    LIMITED USE OF LEFT SHOULDER AND ARM   TBI (traumatic brain injury)    SKULL FX 2009   Vertigo    Vertigo     Past Surgical History:  Procedure Laterality Date   abd pain     APPENDECTOMY     BACK SURGERY     LUMBAR FUSIONS X 2   CATARACT EXTRACTION W/PHACO Right 01/07/2015   Procedure: CATARACT EXTRACTION PHACO AND INTRAOCULAR LENS PLACEMENT (IOC);  Surgeon: Galen Manila, [name];  Location: [facility] ORS;  Service: Ophthalmology;  Laterality: Right;  [place] 0 AP% 0 CDE 0 fluid pack lot #[id number]   CATARACT EXTRACTION W/PHACO Left 01/28/2015   Procedure: CATARACT EXTRACTION PHACO AND INTRAOCULAR LENS PLACEMENT (IOC);  Surgeon: Galen Manila, [name];  Location: [facility] ORS;  Service: Ophthalmology;  Laterality: Left;  [place] 00:18 AP%7.1 CDE1.32 fluid pack lot # [id number]   Cerebral Catheterization for Brain Bleed  02/02/2019   [facility]   COLON SURGERY     COLONOSCOPY WITH PROPOFOL N/A 05/07/2020   Procedure:  COLONOSCOPY WITH PROPOFOL;  Surgeon: Toney Reil, [name];  Location: [facility] ENDOSCOPY;  Service: Gastroenterology;  Laterality: N/A;   COLONOSCOPY WITH PROPOFOL N/A 06/13/2020   Procedure: COLONOSCOPY WITH PROPOFOL;  Surgeon: Midge Minium, [name];  Location: [facility] ENDOSCOPY;  Service: Endoscopy;  Laterality: N/A;   ESOPHAGOGASTRODUODENOSCOPY (EGD) WITH PROPOFOL N/A 05/07/2020   Procedure: ESOPHAGOGASTRODUODENOSCOPY (EGD) WITH PROPOFOL;  Surgeon: Toney Reil, [name];  Location: [facility] ENDOSCOPY;  Service: Gastroenterology;  Laterality: N/A;   ESOPHAGOGASTRODUODENOSCOPY (EGD) WITH PROPOFOL N/A 06/13/2020   Procedure: ESOPHAGOGASTRODUODENOSCOPY (EGD) WITH PROPOFOL;  Surgeon: Midge Minium, [name];  Location: [facility] ENDOSCOPY;  Service: Endoscopy;  Laterality: N/A;   EYE SURGERY     LEEP     XI ROBOT ABDOMINAL PERINEAL RESECTION N/A 01/24/2019   Procedure: Robot assisted appendectomy ;  Surgeon: Campbell Lerner, [name];  Location: [facility] ORS;  Service: General;  Laterality: N/A;    Prior to Admission medications   Medication Sig Start Date End Date Taking? Authorizing Provider  albuterol (VENTOLIN HFA) 108 (90 Base) MCG/ACT inhaler INHALE 2 PUFFS BY MOUTH EVERY 4 TO 6 HOURS AS NEEDED 03/19/22   Danelle Berry, PA-C  carisoprodol (SOMA) 350 MG tablet Take 700 mg by mouth 2 (two) times daily.    Provider, Historical, [name]  desvenlafaxine (PRISTIQ) 100 MG 24 hr tablet Take 1 tablet (100 mg total)  by mouth daily. 12/10/21   Danelle Berry, PA-C  LINZESS 290 MCG CAPS capsule TAKE 1 CAPSULE BY MOUTH EVERY DAY BEFORE BREAKFAST 06/07/22   Becky Mood, [name]  lisinopril-hydrochlorothiazide (ZESTORETIC) 20-12.5 MG tablet Take 1 tablet by mouth daily. 12/24/21   Danelle Berry, PA-C  ondansetron (ZOFRAN) 4 MG tablet Take 1 tablet (4 mg total) by mouth every 8 (eight) hours as needed for nausea or vomiting. 05/21/22   Danelle Berry, PA-C  pantoprazole (PROTONIX) 40 MG tablet Take 1 tablet (40 mg total) by mouth daily. 05/21/22 05/21/23   Danelle Berry, PA-C  sucralfate (CARAFATE) 1 g tablet Take 1 tablet (1 g total) by mouth 3 (three) times daily as needed. 05/21/22 05/21/23  Danelle Berry, PA-C    Allergies as of 06/14/2022 - Review Complete 05/21/2022  Allergen Reaction Noted   Contrast media [iodinated contrast media]  01/08/2016   Diphenhydramine hcl Hives and Itching 04/15/2015   Adhesive [tape]  01/01/2015   Iodine Other (See Comments) 05/05/2016   Prednisone Anxiety 07/27/2017   Sulfa antibiotics Rash 07/04/2012    Family History  Problem Relation Age of Onset   Ovarian cancer Maternal Aunt        70's   Depression Mother    Heart disease Father    Hypertension Father    Diabetes Father    Post-traumatic stress disorder Sister    Hypertension Sister    Diabetes Brother    Depression Brother    Hypertension Brother     Social History   Socioeconomic History   Marital status: Divorced    Spouse name: Not on file   Number of children: 1   Years of education: Not on file   Highest education level: Associate degree: occupational, Scientist, product/process development, or vocational program  Occupational History    Comment: DISABLED  Tobacco Use   Smoking status: Every Day    Packs/day: 1    Types: Cigarettes   Smokeless tobacco: Never  Vaping Use   Vaping Use: Every day   Substances: CBD  Substance and Sexual Activity   Alcohol use: No   Drug use: Yes    Types: Marijuana   Sexual activity: Never  Other Topics Concern   Not on file  Social History Narrative   Not on file   Social Determinants of Health   Financial Resource Strain: Medium Risk (05/21/2022)   Overall Financial Resource Strain (CARDIA)    Difficulty of Paying Living Expenses: Somewhat hard  Food Insecurity: No Food Insecurity (05/21/2022)   Hunger Vital Sign    Worried About Running Out of Food in the Last Year: Never true    Ran Out of Food in the Last Year: Never true  Transportation Needs: No Transportation Needs (05/21/2022)   PRAPARE -  Administrator, Civil Service (Medical): No    Lack of Transportation (Non-Medical): No  Physical Activity: Inactive (05/21/2022)   Exercise Vital Sign    Days of Exercise per Week: 0 days    Minutes of Exercise per Session: 0 min  Stress: Stress Concern Present (05/21/2022)   [employer] of Occupational Health - Occupational Stress Questionnaire    Feeling of Stress : To some extent  Social Connections: Socially Isolated (05/21/2022)   Social Connection and Isolation Panel [NHANES]    Frequency of Communication with Friends and Family: More than three times a week    Frequency of Social Gatherings with Friends and Family: Never    Attends Religious Services: Never  Active Member of Clubs or Organizations: No    Attends Banker Meetings: Never    Marital Status: Divorced  Catering manager Violence: Not At Risk (05/21/2022)   Humiliation, Afraid, Rape, and Kick questionnaire    Fear of Current or Ex-Partner: No    Emotionally Abused: No    Physically Abused: No    Sexually Abused: No    Review of Systems: See HPI, otherwise negative ROS  Physical Exam: There were no vitals taken for this visit. General:   Alert,  pleasant and cooperative in NAD Head:  Normocephalic and atraumatic. Neck:  Supple; no masses or thyromegaly. Lungs:  Clear throughout to auscultation, normal respiratory effort.    Heart:  +S1, +S2, Regular rate and rhythm, No edema. Abdomen:  Soft, nontender and nondistended. Normal bowel sounds, without guarding, and without rebound.   Neurologic:  Alert and  oriented x4;  grossly normal neurologically.  Impression/Plan: [name] is here for an endoscopy  to be performed for  evaluation of melena    Risks, benefits, limitations, and alternatives regarding endoscopy have been reviewed with the patient.  Questions have been answered.  All parties agreeable.   Becky Mood, [name]  06/23/2022, 9:58 AM

## 2022-06-23 NOTE — Anesthesia Procedure Notes (Signed)
Procedure Name: General with mask airway Date/Time: 06/23/2022 11:09 AM  Performed by: Mohammed Kindle, CRNAPre-anesthesia Checklist: Patient identified, Emergency Drugs available, Suction available and Patient being monitored Patient Re-evaluated:Patient Re-evaluated prior to induction Oxygen Delivery Method: Simple face mask Induction Type: IV induction Placement Confirmation: positive ETCO2, CO2 detector and breath sounds checked- equal and bilateral Dental Injury: Teeth and Oropharynx as per pre-operative assessment

## 2022-06-24 ENCOUNTER — Encounter: Payer: Self-pay | Admitting: Gastroenterology

## 2022-06-30 ENCOUNTER — Other Ambulatory Visit: Payer: Self-pay | Admitting: Family Medicine

## 2022-06-30 ENCOUNTER — Telehealth: Payer: Self-pay | Admitting: Family Medicine

## 2022-06-30 DIAGNOSIS — R109 Unspecified abdominal pain: Secondary | ICD-10-CM

## 2022-06-30 DIAGNOSIS — R112 Nausea with vomiting, unspecified: Secondary | ICD-10-CM

## 2022-06-30 NOTE — Telephone Encounter (Signed)
The patient called in stating her refill for her ondansetron (ZOFRAN) 4 MG tablet has been denied. She states she needs this for her nausea and vomiting. It states refill is not appropriate. Please assist patient further

## 2022-07-01 ENCOUNTER — Telehealth: Payer: Self-pay | Admitting: Gastroenterology

## 2022-07-01 NOTE — Telephone Encounter (Signed)
Left detailed vm °

## 2022-07-01 NOTE — Telephone Encounter (Signed)
She would need to be seen again?

## 2022-07-02 ENCOUNTER — Telehealth: Payer: Self-pay | Admitting: Gastroenterology

## 2022-07-02 NOTE — Telephone Encounter (Signed)
Pt left vm has a question in ref to medication linzess and zofran please return call

## 2022-07-06 ENCOUNTER — Encounter: Payer: Self-pay | Admitting: Gastroenterology

## 2022-07-07 NOTE — Telephone Encounter (Signed)
Called patient but had to leave her a voicemail to call me back. I will also send her a message through MyChart and hopefully I could help her with her questions.

## 2022-07-08 NOTE — Telephone Encounter (Signed)
Patient wanted an appointment with Dr. Tobi Bastos and she will be seen on 07/13/2022 at 2:45 PM. Patient is aware of appointment and had no further questions.

## 2022-07-12 NOTE — Telephone Encounter (Signed)
Error

## 2022-07-13 ENCOUNTER — Encounter: Payer: Self-pay | Admitting: Gastroenterology

## 2022-07-13 ENCOUNTER — Ambulatory Visit (INDEPENDENT_AMBULATORY_CARE_PROVIDER_SITE_OTHER): Payer: 59 | Admitting: Gastroenterology

## 2022-07-13 VITALS — BP 110/77 | HR 99 | Temp 98.1°F | Wt 132.0 lb

## 2022-07-13 DIAGNOSIS — K219 Gastro-esophageal reflux disease without esophagitis: Secondary | ICD-10-CM

## 2022-07-13 DIAGNOSIS — K5909 Other constipation: Secondary | ICD-10-CM

## 2022-07-13 MED ORDER — SUCRALFATE 1 G PO TABS: 1.0000 g | ORAL_TABLET | Freq: Four times a day (QID) | ORAL | 11 refills | Status: DC

## 2022-07-13 MED ORDER — PANTOPRAZOLE SODIUM 40 MG PO TBEC: 40.0000 mg | DELAYED_RELEASE_TABLET | Freq: Two times a day (BID) | ORAL | 3 refills | Status: DC

## 2022-07-13 MED ORDER — LACTULOSE 10 GM/15ML PO SOLN: 30.0000 g | Freq: Four times a day (QID) | ORAL | 11 refills | Status: DC | PRN

## 2022-07-13 NOTE — Progress Notes (Unsigned)
Wyline Mood MD, MRCP(U.K) [address]  [address]  [place], [place] [address]  Main: [phone]  Fax: [phone]   Primary Care Physician: Danelle Berry, PA-C  Primary Gastroenterologist:  Dr. Wyline Mood   Chief Complaint  Patient presents with   gastroparesis    HPI: [name] is a 57 y.o. female Summary of history :     Initially referred and seen on 03/20/2020 for abdominal pain bloating and constipation  she had been on oxycodone.  History of traumatic brain injury.Cathlean Sauer of the abdomen in November 2021 showed mild to moderate increasing colonic stool burden.  CT scan of the abdomen in December 2021 demonstrated resolution of previously seen small bowel obstruction, moderate constipation.November 2021: TSH normal, hemoglobin 15.8, CMP normal except an elevated calcium and alkaline phosphatase.     She was admitted in March 2022 with abdominal pain nausea vomiting and found to have a small bowel obstruction.  EGD showed an esophageal ulcer with no stigmata of bleeding colonoscopy was attempted but inadequate preparation.She was seen by general surgery.     03/20/2020: H. pylori breath test negative.  PTH normal, GGT 24 normal, fractionated alkaline phosphatase normal.  ANA negative.  AMA negative.  CMP showed isolated elevation of alkaline phosphatase at 179.   05/01/2020 CT abdomen and pelvis without contrast demonstrated findings consistent with partial small bowel obstruction 05/02/2020: Right upper quadrant ultrasound normal  06/13/2020: Colonoscopy : 2 mm polyp resected. EGD: small hiatal hernia.  06/05/2020: MR enterogram : Large burden of stool in colon and rectum . No bowel obstruction.  05/04/2020: CMP: Normal AST, ALT, alkaline phosphatase      Interval history 06/14/2022-07/13/2022  06/13/2021: Hb 14   06/23/2022: EGD: Normal   She is known to have a hiatal hernia.   Today she is here to see me because she states she had a burning sensation in her throat for  over 2 weeks time.  Taking Protonix once a day.  Taking Carafate 2 times a day.  Denies any heartburn.  Her constipation is also not doing as well as it should has not had a bowel movement for 2 days.  Is on Linzess. Current Outpatient Medications  Medication Sig Dispense Refill   albuterol (VENTOLIN HFA) 108 (90 Base) MCG/ACT inhaler INHALE 2 PUFFS BY MOUTH EVERY 4 TO 6 HOURS AS NEEDED 8.5 each 3   buprenorphine (SUBUTEX) 8 MG SUBL SL tablet Place 8 mg under the tongue 2 times daily at 12 noon and 4 pm.     carisoprodol (SOMA) 350 MG tablet Take 700 mg by mouth 2 (two) times daily.     desvenlafaxine (PRISTIQ) 100 MG 24 hr tablet Take 1 tablet (100 mg total) by mouth daily. 90 tablet 3   LINZESS 290 MCG CAPS capsule TAKE 1 CAPSULE BY MOUTH EVERY DAY BEFORE BREAKFAST 90 capsule 2   lisinopril-hydrochlorothiazide (ZESTORETIC) 20-12.5 MG tablet Take 1 tablet by mouth daily. 90 tablet 3   ondansetron (ZOFRAN) 4 MG tablet Take 1 tablet (4 mg total) by mouth every 8 (eight) hours as needed for nausea or vomiting. 20 tablet 0   pantoprazole (PROTONIX) 40 MG tablet Take 1 tablet (40 mg total) by mouth daily. 30 tablet 1   sucralfate (CARAFATE) 1 g tablet Take 1 tablet (1 g total) by mouth 3 (three) times daily as needed. 30 tablet 2   No current facility-administered medications for this visit.    Allergies as of 07/13/2022 - Review Complete  07/13/2022  Allergen Reaction Noted   Contrast media [iodinated contrast media]  01/08/2016   Diphenhydramine hcl Hives and Itching 04/15/2015   Adhesive [tape]  01/01/2015   Iodine Other (See Comments) 05/05/2016   Prednisone Anxiety 07/27/2017   Sulfa antibiotics Rash 07/04/2012     ROS:  General: Negative for anorexia, weight loss, fever, chills, fatigue, weakness. ENT: Negative for hoarseness, difficulty swallowing , nasal congestion. CV: Negative for chest pain, angina, palpitations, dyspnea on exertion, peripheral edema.  Respiratory: Negative for  dyspnea at rest, dyspnea on exertion, cough, sputum, wheezing.  GI: See history of present illness. GU:  Negative for dysuria, hematuria, urinary incontinence, urinary frequency, nocturnal urination.  Endo: Negative for unusual weight change.    Physical Examination:   BP 110/77   Pulse 99   Temp 98.1 F (36.7 C) (Oral)   Wt 132 lb (59.9 kg)   BMI 20.07 kg/m   General: Well-nourished, well-developed in no acute distress.  Eyes: No icterus. Conjunctivae pink. Mouth: Oropharyngeal mucosa moist and pink , no lesions erythema or exudate. Neuro: Alert and oriented x 3.  Grossly intact. Skin: Warm and dry, no jaundice.   Psych: Alert and cooperative, normal mood and affect.   Imaging Studies: No results found.  Assessment and Plan:   [name] is a 57 y.o. y/o female  with a history of constipation  and possible gastroparesis, hiatal hernia recent episode of melena , EGD showed no abnormality . Hb stable.  Today main issues are burning sensation in her throat for 2 weeks and constipation not doing better on Linzess which is usually helps.     Plan 1.  Continue Linzess 290 mcg daily, add lactulose as needed prescription provided 2.  Increase Protonix to twice a day for 2 weeks, increase Carafate to 4 times daily for 2 weeks.     Dr Wyline Mood  MD,MRCP [facility]) Follow up in as needed

## 2022-07-20 ENCOUNTER — Ambulatory Visit: Payer: 59 | Admitting: Gastroenterology

## 2022-09-05 ENCOUNTER — Other Ambulatory Visit: Payer: Self-pay | Admitting: Family Medicine

## 2022-09-05 DIAGNOSIS — E782 Mixed hyperlipidemia: Secondary | ICD-10-CM

## 2022-10-04 ENCOUNTER — Ambulatory Visit: Payer: 59 | Admitting: Family Medicine

## 2022-10-12 ENCOUNTER — Other Ambulatory Visit: Payer: Self-pay | Admitting: Family Medicine

## 2022-10-12 ENCOUNTER — Other Ambulatory Visit: Payer: Self-pay | Admitting: Gastroenterology

## 2022-10-12 DIAGNOSIS — I10 Essential (primary) hypertension: Secondary | ICD-10-CM

## 2022-10-12 DIAGNOSIS — F482 Pseudobulbar affect: Secondary | ICD-10-CM

## 2022-10-12 DIAGNOSIS — S069XAS Unspecified intracranial injury with loss of consciousness status unknown, sequela: Secondary | ICD-10-CM

## 2022-11-01 ENCOUNTER — Other Ambulatory Visit: Payer: Self-pay | Admitting: Family Medicine

## 2022-11-01 DIAGNOSIS — R112 Nausea with vomiting, unspecified: Secondary | ICD-10-CM

## 2022-11-01 DIAGNOSIS — R109 Unspecified abdominal pain: Secondary | ICD-10-CM

## 2022-11-05 ENCOUNTER — Ambulatory Visit: Payer: 59 | Admitting: Family Medicine

## 2022-11-08 ENCOUNTER — Ambulatory Visit (INDEPENDENT_AMBULATORY_CARE_PROVIDER_SITE_OTHER): Payer: 59 | Admitting: Family Medicine

## 2022-11-08 ENCOUNTER — Encounter: Payer: Self-pay | Admitting: Family Medicine

## 2022-11-08 VITALS — BP 96/64 | HR 79 | Temp 98.1°F | Resp 16 | Ht 68.0 in | Wt 120.6 lb

## 2022-11-08 DIAGNOSIS — I10 Essential (primary) hypertension: Secondary | ICD-10-CM

## 2022-11-08 DIAGNOSIS — E782 Mixed hyperlipidemia: Secondary | ICD-10-CM | POA: Diagnosis not present

## 2022-11-08 DIAGNOSIS — J449 Chronic obstructive pulmonary disease, unspecified: Secondary | ICD-10-CM

## 2022-11-08 DIAGNOSIS — R634 Abnormal weight loss: Secondary | ICD-10-CM | POA: Insufficient documentation

## 2022-11-08 DIAGNOSIS — F0630 Mood disorder due to known physiological condition, unspecified: Secondary | ICD-10-CM | POA: Diagnosis not present

## 2022-11-08 DIAGNOSIS — G43719 Chronic migraine without aura, intractable, without status migrainosus: Secondary | ICD-10-CM | POA: Diagnosis not present

## 2022-11-08 DIAGNOSIS — Z5181 Encounter for therapeutic drug level monitoring: Secondary | ICD-10-CM

## 2022-11-08 DIAGNOSIS — K219 Gastro-esophageal reflux disease without esophagitis: Secondary | ICD-10-CM

## 2022-11-08 NOTE — Assessment & Plan Note (Signed)
Mood and sx have been well controlled on pristiq    11/08/2022   10:34 AM 05/21/2022   10:27 AM 05/07/2022    9:21 AM  Depression screen PHQ 2/9  Decreased Interest 0 3 3  Down, Depressed, Hopeless 0 3 3  PHQ - 2 Score 0 6 6  Altered sleeping 0 2 2  Tired, decreased energy 0 2 2  Change in appetite 0 3 3  Feeling bad or failure about yourself  0 1 1  Trouble concentrating 0 3 3  Moving slowly or fidgety/restless 0 0 0  Suicidal thoughts 0 0 0  PHQ-9 Score 0 17 17  Difficult doing work/chores Not difficult at all Very difficult Very difficult      05/07/2022    9:21 AM 05/04/2022    9:30 AM 10/06/2021    9:03 AM 09/08/2020   10:50 AM  GAD 7 : Generalized Anxiety Score  Nervous, Anxious, on Edge 2 2 0   Control/stop worrying 2 2 1 1   Worry too much - different things 2 2 1 1   Trouble relaxing 3 3 1 2   Restless 2 2 1 1   Easily annoyed or irritable 2 2 1 3   Afraid - awful might happen 0 0 1 1  Total GAD 7 Score 13 13 6    Anxiety Difficulty Very difficult Somewhat difficult Somewhat difficult Somewhat difficult

## 2022-11-08 NOTE — Assessment & Plan Note (Signed)
Denies any sx or recent exacerbations

## 2022-11-08 NOTE — Patient Instructions (Addendum)
We want to see you again in a month or so to check on your weight.  Try to push more frequent snacks and high calorie foods as able.  If you keep having some dizzy episodes or the lower, soft BP then break your BP med in half and take a half dose.   Protein-Energy Malnutrition Protein-energy malnutrition is when a person does not eat enough protein, fat, and calories. When this happens over time, it can lead to severe loss of muscle tissue (muscle wasting). This condition also affects the body's defense system (immune system) and can lead to other health problems. What are the causes? This condition may be caused by: Not eating enough protein, fat, or calories. Having certain chronic medical conditions. Eating too little. What increases the risk? The following factors may make you more likely to develop this condition: Living in poverty. Dosher-term hospitalization. Alcohol or drug dependency. Addiction often leads to a lifestyle in which proper diet is ignored. Dependency can also hurt the metabolism and the body's ability to absorb nutrients. Eating disorders, such as anorexia nervosa or bulimia. Chewing or swallowing problems. People with these disorders may not eat enough. Having certain conditions, such as: Inflammatory bowel disease. Inflammation of the intestines makes it difficult for the body to absorb nutrients. Cancer or AIDS. These diseases can cause a loss of appetite. Chronic heart failure. This interferes with how the body uses nutrients. Cystic fibrosis. This disease can make it difficult for the body to absorb nutrients. Eating a diet that extremely restricts protein, fat, or calorie intake. What are the signs or symptoms? Symptoms of this condition include: Tiredness (fatigue). Weakness. Dizziness. Fainting. Weight loss. Loss of muscle tone and muscle mass. Poor immune response. Lack of menstruation. Poor memory. Hair loss. Skin changes. How is this  diagnosed? This condition may be diagnosed based on: Your medical and dietary history. A physical exam. This may include a measurement of your body mass index. Blood tests. How is this treated? This condition may be managed with: Nutrition therapy. This may include working with a Data processing manager. Treatment for underlying conditions. People with severe protein-energy malnutrition may need to be treated in a hospital. This may involve receiving nutrition and fluids through an IV. Follow these instructions at home:  Eat a balanced diet. In each meal, include at least one food that is high in protein. Foods that are high in protein include: Meat. Poultry. Fish. Eggs. Cheese. Milk. Beans. Nuts. Eat nutrient-rich foods that are easy to swallow and digest, such as: Fruit and yogurt smoothies. Oatmeal with nut butter. Nutrition supplement drinks. Try to eat six small meals each day instead of three large meals. Take vitamin and protein supplements as told by your health care provider or dietitian. Follow your health care provider's recommendations about exercise and activity. Keep all follow-up visits. This is important. Contact a health care provider if: You have increased weakness or fatigue. You faint. You are a woman and you stop having your period (menstruating). You have rapid hair loss. You have unexpected weight loss. You have diarrhea. You have nausea and vomiting. Get help right away if: You have difficulty breathing. You have chest pain. These symptoms may represent a serious problem that is an emergency. Do not wait to see if the symptoms will go away. Get medical help right away. Call your local emergency services (911 in the [place] Do not drive yourself to the hospital. Summary Protein-energy malnutrition is when a person does not eat enough protein, fat,  and calories. Protein-energy malnutrition can lead to severe loss of muscle tissue (muscle wasting). This condition also  affects the body's defense system (immune system) and can lead to other health problems. Talk with your health care provider about treatment for this condition. Effective treatment depends on the underlying cause of the malnutrition. This information is not intended to replace advice given to you by your health care provider. Make sure you discuss any questions you have with your health care provider. Document Revised: 02/15/2020 Document Reviewed: 02/16/2020 Elsevier Patient Education  2024 [employer].

## 2022-11-08 NOTE — Assessment & Plan Note (Signed)
Poorly controlled - just saw GI for f/up and med doses adjusted

## 2022-11-08 NOTE — Progress Notes (Signed)
Name: [name]   MRN: [id number]    DOB: [birth year]   Date:11/08/2022       Progress Note  Chief Complaint  Patient presents with   Follow-up   Hypertension   Hyperlipidemia     Subjective:   [name] is a 57 y.o. female, presents to clinic for routine f/up and med refills  Hypertension:  Currently managed on lisinopril-hydrochlorothiazide - BP a little softer today, hx of BP elevation with stress/anxiety/uncontrolled pain Pt reports good med compliance and denies any SE.   Blood pressure today is well controlled. BP Readings from Last 3 Encounters:  11/08/22 96/64  07/13/22 110/77  06/23/22 118/84   Pt denies CP, SOB, exertional sx, LE edema, palpitation, Ha's, visual disturbances, lightheadedness, hypotension, syncope.  Lost weight with GI issuesroughly 16 lbs loss in 6 months, 12 in past 4 months Wt Readings from Last 5 Encounters:  11/08/22 120 lb 9.6 oz (54.7 kg)  07/13/22 132 lb (59.9 kg)  06/23/22 136 lb 9.6 oz (62 kg)  05/21/22 134 lb (60.8 kg)  05/07/22 136 lb (61.7 kg)   BMI Readings from Last 5 Encounters:  11/08/22 18.34 kg/m  07/13/22 20.07 kg/m  06/23/22 20.77 kg/m  06/14/22 20.37 kg/m  05/21/22 20.99 kg/m     HLD not on meds Lab Results  Component Value Date   CHOL 278 (H) 12/10/2021   HDL 78 12/10/2021   LDLCALC 178 (H) 12/10/2021   TRIG 100 12/10/2021   CHOLHDL 3.6 12/10/2021  The 10-year ASCVD risk score (Arnett DK, et al., 2019) is: 3.7%   Values used to calculate the score:     Age: 45 years     Sex: Female     Is Non-Hispanic African American: No     Diabetic: No     Tobacco smoker: Yes     Systolic Blood Pressure: 96 mmHg     Is BP treated: Yes     HDL Cholesterol: 78 mg/dL     Total Cholesterol: 278 mg/dL   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 - stable, no concerns or changes- phq reviewed, positive   GI upset/constipation/N seeing [facility] now on lactulose, linzess    Current  Outpatient Medications:    albuterol (VENTOLIN HFA) 108 (90 Base) MCG/ACT inhaler, TAKE 2 PUFFS BY MOUTH EVERY 4 TO 6 HOURS AS NEEDED, Disp: 8.5 each, Rfl: 3   buprenorphine (SUBUTEX) 8 MG SUBL SL tablet, Place 8 mg under the tongue 2 times daily at 12 noon and 4 pm., Disp: , Rfl:    carisoprodol (SOMA) 350 MG tablet, Take 700 mg by mouth 2 (two) times daily., Disp: , Rfl:    desvenlafaxine (PRISTIQ) 100 MG 24 hr tablet, TAKE 1 TABLET BY MOUTH EVERY DAY, Disp: 90 tablet, Rfl: 1   lactulose (CHRONULAC) 10 GM/15ML solution, Take 45 mLs (30 g total) by mouth 4 (four) times daily as needed for mild constipation., Disp: 946 mL, Rfl: 11   LINZESS 290 MCG CAPS capsule, TAKE 1 CAPSULE BY MOUTH DAILY., Disp: 30 capsule, Rfl: 2   lisinopril-hydrochlorothiazide (ZESTORETIC) 20-12.5 MG tablet, TAKE 1 TABLET BY MOUTH EVERY DAY, Disp: 90 tablet, Rfl: 1   ondansetron (ZOFRAN) 4 MG tablet, Take 1 tablet (4 mg total) by mouth every 8 (eight) hours as needed for nausea or vomiting., Disp: 20 tablet, Rfl: 0   pantoprazole (PROTONIX) 40 MG tablet, Take 1 tablet (40 mg total) by mouth 2 (two) times daily., Disp:  180 tablet, Rfl: 3   sucralfate (CARAFATE) 1 g tablet, Take 1 tablet (1 g total) by mouth 4 (four) times daily., Disp: 120 tablet, Rfl: 11  Patient Active Problem List   Diagnosis Date Noted   Melena 06/23/2022   Mixed hyperlipidemia 12/09/2021   Polyp of transverse colon    Gastric outlet obstruction 03/20/2020   Chronic obstructive pulmonary disease (HCC) 06/14/2019   Tobacco use 06/14/2019   Traumatic brain injury with loss of consciousness (HCC) 05/08/2019   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/08/2019   Intractable 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 without status migrainosus 04/06/2019   Hypertension    Chronic neck pain    FH: colon polyps 11/15/2016   Other dysphagia 11/15/2016   Other constipation 11/15/2016   PTSD (post-traumatic stress disorder) 05/18/2016    Breast mass, left 01/08/2016   Chronic pain 01/08/2016   GERD (gastroesophageal reflux disease) 01/08/2016   Difficulty sleeping 08/05/2015    Past Surgical History:  Procedure Laterality Date   abd pain     APPENDECTOMY     BACK SURGERY     LUMBAR FUSIONS X 2   CATARACT EXTRACTION W/PHACO Right 01/07/2015   Procedure: CATARACT EXTRACTION PHACO AND INTRAOCULAR LENS PLACEMENT (IOC);  Surgeon: Galen Manila, MD;  Location: [facility] ORS;  Service: Ophthalmology;  Laterality: Right;  [place] 0 AP% 0 CDE 0 fluid pack lot #[id number]   CATARACT EXTRACTION W/PHACO Left 01/28/2015   Procedure: CATARACT EXTRACTION PHACO AND INTRAOCULAR LENS PLACEMENT (IOC);  Surgeon: Galen Manila, MD;  Location: [facility] ORS;  Service: Ophthalmology;  Laterality: Left;  [place] 00:18 AP%7.1 CDE1.32 fluid pack lot # [id number]   Cerebral Catheterization for Brain Bleed  02/02/2019   [facility]   COLON SURGERY     COLONOSCOPY WITH PROPOFOL N/A 05/07/2020   Procedure: COLONOSCOPY WITH PROPOFOL;  Surgeon: Toney Reil, MD;  Location: [facility] ENDOSCOPY;  Service: Gastroenterology;  Laterality: N/A;   COLONOSCOPY WITH PROPOFOL N/A 06/13/2020   Procedure: COLONOSCOPY WITH PROPOFOL;  Surgeon: Midge Minium, MD;  Location: [facility] ENDOSCOPY;  Service: Endoscopy;  Laterality: N/A;   ESOPHAGOGASTRODUODENOSCOPY (EGD) WITH PROPOFOL N/A 05/07/2020   Procedure: ESOPHAGOGASTRODUODENOSCOPY (EGD) WITH PROPOFOL;  Surgeon: Toney Reil, MD;  Location: [facility] ENDOSCOPY;  Service: Gastroenterology;  Laterality: N/A;   ESOPHAGOGASTRODUODENOSCOPY (EGD) WITH PROPOFOL N/A 06/13/2020   Procedure: ESOPHAGOGASTRODUODENOSCOPY (EGD) WITH PROPOFOL;  Surgeon: Midge Minium, MD;  Location: [facility] ENDOSCOPY;  Service: Endoscopy;  Laterality: N/A;   ESOPHAGOGASTRODUODENOSCOPY (EGD) WITH PROPOFOL N/A 06/23/2022   Procedure: ESOPHAGOGASTRODUODENOSCOPY (EGD) WITH PROPOFOL;  Surgeon: Wyline Mood, MD;  Location: [facility] ENDOSCOPY;  Service: Gastroenterology;   Laterality: N/A;   EYE SURGERY     LEEP     XI ROBOT ABDOMINAL PERINEAL RESECTION N/A 01/24/2019   Procedure: Robot assisted appendectomy ;  Surgeon: Campbell Lerner, MD;  Location: [facility] ORS;  Service: General;  Laterality: N/A;    Family History  Problem Relation Age of Onset   Ovarian cancer Maternal Aunt        6's   Depression Mother    Heart disease Father    Hypertension Father    Diabetes Father    Post-traumatic stress disorder Sister    Hypertension Sister    Diabetes Brother    Depression Brother    Hypertension Brother     Social History   Tobacco Use   Smoking status: Every Day    Current packs/day: 1.00    Types: Cigarettes   Smokeless tobacco: Never  Vaping Use   Vaping status: Every Day   Substances: CBD  Substance Use Topics   Alcohol use: No   Drug use: Yes    Types: Marijuana    Comment: 06/22/2022     Allergies  Allergen Reactions   Contrast Media [Iodinated Contrast Media]    Diphenhydramine Hcl Hives and Itching   Adhesive [Tape]     PAPER TAPE OK   Iodine Other (See Comments)   Prednisone Anxiety   Sulfa Antibiotics Rash    Health Maintenance  Topic Date Due   Lung Cancer Screening  05/04/2023 (Originally 04/13/2017)   Medicare Annual Wellness (AWV)  05/21/2023   PAP SMEAR-Modifier  09/09/2023   MAMMOGRAM  01/16/2024   Colonoscopy  06/14/2027   DTaP/Tdap/Td (2 - Td or Tdap) 09/06/2027   Hepatitis C Screening  Completed   HIV Screening  Completed   HPV VACCINES  Aged Out   INFLUENZA VACCINE  Discontinued   COVID-19 Vaccine  Discontinued   Zoster Vaccines- Shingrix  Discontinued    Chart Review Today: I personally reviewed active problem list, medication list, allergies, family history, social history, health maintenance, notes from last encounter, lab results, imaging with the patient/caregiver today.   Review of Systems  Constitutional: Negative.   HENT: Negative.    Eyes: Negative.   Respiratory: Negative.     Cardiovascular: Negative.   Gastrointestinal: Negative.   Endocrine: Negative.   Genitourinary: Negative.   Musculoskeletal: Negative.   Skin: Negative.   Allergic/Immunologic: Negative.   Neurological: Negative.   Hematological: Negative.   Psychiatric/Behavioral: Negative.    All other systems reviewed and are negative.    Objective:   Vitals:   11/08/22 1034  BP: 96/64  Pulse: 79  Resp: 16  Temp: 98.1 F (36.7 C)  TempSrc: Oral  SpO2: 98%  Weight: 120 lb 9.6 oz (54.7 kg)  Height: 5\' 8"  (1.727 m)    Body mass index is 18.34 kg/m.  Physical Exam Vitals and nursing note reviewed.  Constitutional:      General: She is not in acute distress.    Appearance: Normal appearance. She is well-developed. She is not ill-appearing, toxic-appearing or diaphoretic.  HENT:     Head: Normocephalic and atraumatic.     Nose: Nose normal.  Eyes:     General:        Right eye: No discharge.        Left eye: No discharge.     Conjunctiva/sclera: Conjunctivae normal.  Neck:     Trachea: No tracheal deviation.  Cardiovascular:     Rate and Rhythm: Normal rate and regular rhythm.  Pulmonary:     Effort: Pulmonary effort is normal. No respiratory distress.     Breath sounds: No stridor.  Musculoskeletal:        General: Normal range of motion.  Skin:    General: Skin is warm and dry.     Findings: No rash.  Neurological:     Mental Status: She is alert.     Motor: No abnormal muscle tone.     Coordination: Coordination normal.  Psychiatric:        Behavior: Behavior normal.         Assessment & Plan:   Problem List Items Addressed This Visit       Cardiovascular and Mediastinum   Hypertension - Primary    Chronic, stable, currently well controlled and at goal, often BP high with pain flares or anxiety/mood issues Continue lisinopril-hydrochlorothiazide  Due  for labs With weight loss and improved pain control BP is soft, no syncope or near syncope Watch for  lower BP or sx - could cut pill in half if needed For now pt will keep the same      Relevant Orders   COMPLETE METABOLIC PANEL WITH GFR (Completed)   Intractable 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 without status migrainosus    Per specialists        Respiratory   Chronic obstructive pulmonary disease (HCC)    Denies any sx or recent exacerbations        Digestive   GERD (gastroesophageal reflux disease)    Poorly controlled - just saw GI for f/up and med doses adjusted        Other   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)    Mood and sx have been well controlled on pristiq    11/08/2022   10:34 AM 05/21/2022   10:27 AM 05/07/2022    9:21 AM  Depression screen PHQ 2/9  Decreased Interest 0 3 3  Down, Depressed, Hopeless 0 3 3  PHQ - 2 Score 0 6 6  Altered sleeping 0 2 2  Tired, decreased energy 0 2 2  Change in appetite 0 3 3  Feeling bad or failure about yourself  0 1 1  Trouble concentrating 0 3 3  Moving slowly or fidgety/restless 0 0 0  Suicidal thoughts 0 0 0  PHQ-9 Score 0 17 17  Difficult doing work/chores Not difficult at all Very difficult Very difficult      05/07/2022    9:21 AM 05/04/2022    9:30 AM 10/06/2021    9:03 AM 09/08/2020   10:50 AM  GAD 7 : Generalized Anxiety Score  Nervous, Anxious, on Edge 2 2 0   Control/stop worrying 2 2 1 1   Worry too much - different things 2 2 1 1   Trouble relaxing 3 3 1 2   Restless 2 2 1 1   Easily annoyed or irritable 2 2 1 3   Afraid - awful might happen 0 0 1 1  Total GAD 7 Score 13 13 6    Anxiety Difficulty Very difficult Somewhat difficult Somewhat difficult Somewhat difficult          Mixed hyperlipidemia   Relevant Orders   COMPLETE METABOLIC PANEL WITH GFR (Completed)   Lipid panel (Completed)   Unintentional weight loss of less than 10% body weight within 6 months    Needs closer fup Chronic pain and GI sx likely contributing Encouraged pushing calories and closer f/up She can  only current eat a small portion of fruit like an apple and then she is full and will have severe GI upset or N/V if she tries to eat more - so severely in protein calorie deficit/malnutrition Wt Readings from Last 5 Encounters:  11/08/22 120 lb 9.6 oz (54.7 kg)  07/13/22 132 lb (59.9 kg)  06/23/22 136 lb 9.6 oz (62 kg)  05/21/22 134 lb (60.8 kg)  05/07/22 136 lb (61.7 kg)           Other Visit Diagnoses     Encounter for medication monitoring       Relevant Orders   COMPLETE METABOLIC PANEL WITH GFR (Completed)   Lipid panel (Completed)   CBC with Differential/Platelet (Completed)        Return for 1-2 month weight follow up .    AVS for pt: We want to see you again in  a month or so to check on your weight.  Try to push more frequent snacks and high calorie foods as able.  If you keep having some dizzy episodes or the lower, soft BP then break your BP med in half and take a half dose.   Handout on protein-energy malnutrition given as well   Danelle Berry, PA-C 11/08/22 10:55 AM

## 2022-11-08 NOTE — Assessment & Plan Note (Addendum)
Chronic, stable, currently well controlled and at goal, often BP high with pain flares or anxiety/mood issues Continue lisinopril-hydrochlorothiazide  Due for labs With weight loss and improved pain control BP is soft, no syncope or near syncope Watch for lower BP or sx - could cut pill in half if needed For now pt will keep the same

## 2022-11-09 LAB — CBC WITH DIFFERENTIAL/PLATELET
Absolute Monocytes: 917 {cells}/uL (ref 200–950)
Basophils Absolute: 31 {cells}/uL (ref 0–200)
Basophils Relative: 0.3 %
Eosinophils Absolute: 72 {cells}/uL (ref 15–500)
Eosinophils Relative: 0.7 %
HCT: 42.1 % (ref 35.0–45.0)
Hemoglobin: 14.5 g/dL (ref 11.7–15.5)
Lymphs Abs: 3224 {cells}/uL (ref 850–3900)
MCH: 31.3 pg (ref 27.0–33.0)
MCHC: 34.4 g/dL (ref 32.0–36.0)
MCV: 90.9 fL (ref 80.0–100.0)
MPV: 10.9 fL (ref 7.5–12.5)
Monocytes Relative: 8.9 %
Neutro Abs: 6056 {cells}/uL (ref 1500–7800)
Neutrophils Relative %: 58.8 %
Platelets: 345 10*3/uL (ref 140–400)
RBC: 4.63 10*6/uL (ref 3.80–5.10)
RDW: 13.6 % (ref 11.0–15.0)
Total Lymphocyte: 31.3 %
WBC: 10.3 10*3/uL (ref 3.8–10.8)

## 2022-11-09 LAB — COMPLETE METABOLIC PANEL WITH GFR
AG Ratio: 1.8 (calc) (ref 1.0–2.5)
ALT: 12 U/L (ref 6–29)
AST: 13 U/L (ref 10–35)
Albumin: 4.4 g/dL (ref 3.6–5.1)
Alkaline phosphatase (APISO): 107 U/L (ref 37–153)
BUN: 10 mg/dL (ref 7–25)
CO2: 30 mmol/L (ref 20–32)
Calcium: 9.9 mg/dL (ref 8.6–10.4)
Chloride: 101 mmol/L (ref 98–110)
Creat: 0.64 mg/dL (ref 0.50–1.03)
Globulin: 2.4 g/dL (ref 1.9–3.7)
Glucose, Bld: 103 mg/dL — ABNORMAL HIGH (ref 65–99)
Potassium: 4.6 mmol/L (ref 3.5–5.3)
Sodium: 140 mmol/L (ref 135–146)
Total Bilirubin: 0.3 mg/dL (ref 0.2–1.2)
Total Protein: 6.8 g/dL (ref 6.1–8.1)
eGFR: 104 mL/min/{1.73_m2} (ref >=60)

## 2022-11-09 LAB — LIPID PANEL
Cholesterol: 274 mg/dL — ABNORMAL HIGH (ref <200)
HDL: 40 mg/dL — ABNORMAL LOW (ref >=50)
LDL Cholesterol (Calc): 199 mg/dL — ABNORMAL HIGH
Non-HDL Cholesterol (Calc): 234 mg/dL — ABNORMAL HIGH (ref <130)
Total CHOL/HDL Ratio: 6.9 (calc) — ABNORMAL HIGH (ref <5.0)
Triglycerides: 173 mg/dL — ABNORMAL HIGH (ref <150)

## 2022-12-13 ENCOUNTER — Encounter: Payer: Self-pay | Admitting: Family Medicine

## 2022-12-13 NOTE — Assessment & Plan Note (Signed)
Needs closer fup Chronic pain and GI sx likely contributing Encouraged pushing calories and closer f/up She can only current eat a small portion of fruit like an apple and then she is full and will have severe GI upset or N/V if she tries to eat more - so severely in protein calorie deficit/malnutrition Wt Readings from Last 5 Encounters:  11/08/22 120 lb 9.6 oz (54.7 kg)  07/13/22 132 lb (59.9 kg)  06/23/22 136 lb 9.6 oz (62 kg)  05/21/22 134 lb (60.8 kg)  05/07/22 136 lb (61.7 kg)

## 2022-12-13 NOTE — Assessment & Plan Note (Signed)
Per specialists

## 2022-12-30 ENCOUNTER — Other Ambulatory Visit: Payer: Self-pay | Admitting: Gastroenterology

## 2023-01-05 ENCOUNTER — Emergency Department: Payer: 59

## 2023-01-05 ENCOUNTER — Other Ambulatory Visit: Payer: Self-pay

## 2023-01-05 ENCOUNTER — Emergency Department
Admission: EM | Admit: 2023-01-05 | Discharge: 2023-01-05 | Disposition: A | Discharge status: Home / Self Care | Payer: 59 | Attending: Emergency Medicine | Admitting: Emergency Medicine

## 2023-01-05 DIAGNOSIS — E876 Hypokalemia: Secondary | ICD-10-CM | POA: Insufficient documentation

## 2023-01-05 DIAGNOSIS — Z1152 Encounter for screening for COVID-19: Secondary | ICD-10-CM | POA: Insufficient documentation

## 2023-01-05 DIAGNOSIS — G40909 Epilepsy, unspecified, not intractable, without status epilepticus: Secondary | ICD-10-CM | POA: Diagnosis present

## 2023-01-05 DIAGNOSIS — R569 Unspecified convulsions: Secondary | ICD-10-CM

## 2023-01-05 DIAGNOSIS — R112 Nausea with vomiting, unspecified: Secondary | ICD-10-CM | POA: Diagnosis not present

## 2023-01-05 DIAGNOSIS — R1084 Generalized abdominal pain: Secondary | ICD-10-CM | POA: Insufficient documentation

## 2023-01-05 DIAGNOSIS — D72829 Elevated white blood cell count, unspecified: Secondary | ICD-10-CM | POA: Diagnosis not present

## 2023-01-05 DIAGNOSIS — I10 Essential (primary) hypertension: Secondary | ICD-10-CM | POA: Diagnosis not present

## 2023-01-05 LAB — COMPREHENSIVE METABOLIC PANEL
ALT: 32 U/L (ref 0–44)
AST: 109 U/L — ABNORMAL HIGH (ref 15–41)
Albumin: 4.6 g/dL (ref 3.5–5.0)
Alkaline Phosphatase: 106 U/L (ref 38–126)
Anion gap: 15 (ref 5–15)
BUN: 22 mg/dL — ABNORMAL HIGH (ref 6–20)
CO2: 24 mmol/L (ref 22–32)
Calcium: 9.7 mg/dL (ref 8.9–10.3)
Chloride: 98 mmol/L (ref 98–111)
Creatinine, Ser: 0.69 mg/dL (ref 0.44–1.00)
GFR, Estimated: >60 mL/min (ref >60)
Glucose, Bld: 122 mg/dL — ABNORMAL HIGH (ref 70–99)
Potassium: 3.1 mmol/L — ABNORMAL LOW (ref 3.5–5.1)
Sodium: 137 mmol/L (ref 135–145)
Total Bilirubin: 0.9 mg/dL (ref <1.2)
Total Protein: 8.2 g/dL — ABNORMAL HIGH (ref 6.5–8.1)

## 2023-01-05 LAB — URINALYSIS, ROUTINE W REFLEX MICROSCOPIC
Bilirubin Urine: NEGATIVE
Glucose, UA: NEGATIVE mg/dL
Ketones, ur: 5 mg/dL — AB
Leukocytes,Ua: NEGATIVE
Nitrite: NEGATIVE
Protein, ur: >=300 mg/dL — AB
Specific Gravity, Urine: 1.031 — ABNORMAL HIGH (ref 1.005–1.030)
pH: 6 (ref 5.0–8.0)

## 2023-01-05 LAB — RESP PANEL BY RT-PCR (RSV, FLU A&B, COVID)  RVPGX2
Influenza A by PCR: NEGATIVE
Influenza B by PCR: NEGATIVE
Resp Syncytial Virus by PCR: NEGATIVE
SARS Coronavirus 2 by RT PCR: NEGATIVE

## 2023-01-05 LAB — CBC WITH DIFFERENTIAL/PLATELET
Abs Immature Granulocytes: 0 10*3/uL (ref 0.00–0.07)
Basophils Absolute: 0 10*3/uL (ref 0.0–0.1)
Basophils Relative: 0 %
Eosinophils Absolute: 0 10*3/uL (ref 0.0–0.5)
Eosinophils Relative: 0 %
HCT: 43.9 % (ref 36.0–46.0)
Hemoglobin: 15.1 g/dL — ABNORMAL HIGH (ref 12.0–15.0)
Lymphocytes Relative: 11 %
Lymphs Abs: 2.8 10*3/uL (ref 0.7–4.0)
MCH: 31.4 pg (ref 26.0–34.0)
MCHC: 34.4 g/dL (ref 30.0–36.0)
MCV: 91.3 fL (ref 80.0–100.0)
Monocytes Absolute: 1.8 10*3/uL — ABNORMAL HIGH (ref 0.1–1.0)
Monocytes Relative: 7 %
Neutro Abs: 20.6 10*3/uL — ABNORMAL HIGH (ref 1.7–7.7)
Neutrophils Relative %: 82 %
Platelets: 362 10*3/uL (ref 150–400)
RBC: 4.81 MIL/uL (ref 3.87–5.11)
RDW: 13 % (ref 11.5–15.5)
Smear Review: NORMAL
WBC: 25.1 10*3/uL — ABNORMAL HIGH (ref 4.0–10.5)
nRBC: 0 % (ref 0.0–0.2)

## 2023-01-05 LAB — LIPASE, BLOOD: Lipase: 24 U/L (ref 11–51)

## 2023-01-05 LAB — ETHANOL: Alcohol, Ethyl (B): <10 mg/dL (ref <10)

## 2023-01-05 MED ORDER — LACTATED RINGERS IV BOLUS
1000.0000 mL | Freq: Once | INTRAVENOUS | Status: AC
  Administered 2023-01-05: 1000 mL via INTRAVENOUS

## 2023-01-05 MED ORDER — MORPHINE SULFATE (PF) 4 MG/ML IV SOLN
4.0000 mg | Freq: Once | INTRAVENOUS | Status: AC
  Administered 2023-01-05: 4 mg via INTRAVENOUS
  Filled 2023-01-05: qty 1

## 2023-01-05 MED ORDER — ONDANSETRON HCL 4 MG PO TABS: 4.0000 mg | ORAL_TABLET | Freq: Three times a day (TID) | ORAL | 0 refills | Status: DC | PRN

## 2023-01-05 MED ORDER — HALOPERIDOL LACTATE 5 MG/ML IJ SOLN
2.0000 mg | Freq: Once | INTRAMUSCULAR | Status: AC
  Administered 2023-01-05: 2 mg via INTRAVENOUS
  Filled 2023-01-05: qty 1

## 2023-01-05 NOTE — ED Provider Notes (Signed)
[facility] Provider Note    Event Date/Time   First MD Initiated Contact with Patient 01/05/23 1512     (approximate)   History   Seizures   HPI [name] is a 57 y.o. female with history of TBI, seizures, gastroparesis, HTN presenting today for abdominal pain and seizures.  Patient states over the past 2 days she has not felt well with worsening abdominal pain and associated nausea and vomiting.  Abdominal pain is all over.  She correlates it with her chronic gastroparesis.  She was coming to the emergency department for further evaluation when reportedly and route she had 2 seizures.  Reports she was recently taken off of her seizure medications by her neurology team.  Otherwise denies fever, chills, shortness of breath, chest pain, diarrhea, dysuria.  Chart review: No recent chart notes to verify that she was recently on seizure medication.  Do see a history of TBI, mood disorder, and PTSD.     Physical Exam   Triage Vital Signs: ED Triage Vitals  Encounter Vitals Group     BP 01/05/23 1523 117/89     Systolic BP Percentile --      Diastolic BP Percentile --      Pulse Rate 01/05/23 1523 96     Resp 01/05/23 1523 18     Temp 01/05/23 1523 99.3 F (37.4 C)     Temp Source 01/05/23 1523 Oral     SpO2 01/05/23 1523 99 %     Weight 01/05/23 1524 117 lb (53.1 kg)     Height 01/05/23 1524 5\' 8"  (1.727 m)     Head Circumference --      Peak Flow --      Pain Score 01/05/23 1523 8     Pain Loc --      Pain Education --      Exclude from Growth Chart --     Most recent vital signs: Vitals:   01/05/23 1523 01/05/23 1800  BP: 117/89 100/84  Pulse: 96 80  Resp: 18 16  Temp: 99.3 F (37.4 C) 99.3 F (37.4 C)  SpO2: 99% 97%   Physical Exam: I have reviewed the vital signs and nursing notes. General: Awake, alert, no acute distress.  Nontoxic appearing. Head:  Atraumatic, normocephalic.   ENT:  EOM intact, PERRL. Oral mucosa is pink and  moist with no lesions. Neck: Neck is supple with full range of motion, No meningeal signs. Cardiovascular:  RRR, No murmurs. Peripheral pulses palpable and equal bilaterally. Respiratory:  Symmetrical chest wall expansion.  No rhonchi, rales, or wheezes.  Good air movement throughout.  No use of accessory muscles.   Musculoskeletal:  No cyanosis or edema. Moving extremities with full ROM Abdomen:  Soft, generalized tenderness to palpation throughout abdomen, nondistended. Neuro:  GCS 15, moving all four extremities, interacting appropriately. Speech clear. Psych:  Calm, appropriate.   Skin:  Warm, dry, no rash.    ED Results / Procedures / Treatments   Labs (all labs ordered are listed, but only abnormal results are displayed) Labs Reviewed  CBC WITH DIFFERENTIAL/PLATELET - Abnormal; Notable for the following components:      Result Value   WBC 25.1 (*)    Hemoglobin 15.1 (*)    Neutro Abs 20.6 (*)    Monocytes Absolute 1.8 (*)    All other components within normal limits  COMPREHENSIVE METABOLIC PANEL - Abnormal; Notable for the following components:   Potassium 3.1 (*)  Glucose, Bld 122 (*)    BUN 22 (*)    Total Protein 8.2 (*)    AST 109 (*)    All other components within normal limits  URINALYSIS, ROUTINE W REFLEX MICROSCOPIC - Abnormal; Notable for the following components:   Color, Urine AMBER (*)    APPearance CLOUDY (*)    Specific Gravity, Urine 1.031 (*)    Hgb urine dipstick MODERATE (*)    Ketones, ur 5 (*)    Protein, ur >=300 (*)    Bacteria, UA RARE (*)    All other components within normal limits  RESP PANEL BY RT-PCR (RSV, FLU A&B, COVID)  [id number]  ETHANOL  LIPASE, BLOOD     EKG    RADIOLOGY Independently interpreted CT imaging with no acute pathology   PROCEDURES:  Critical Care performed: No  Procedures   MEDICATIONS ORDERED IN ED: Medications  haloperidol lactate (HALDOL) injection 2 mg (2 mg Intravenous Given 01/05/23 1538)   morphine (PF) 4 MG/ML injection 4 mg (4 mg Intravenous Given 01/05/23 1538)  lactated ringers bolus 1,000 mL (1,000 mLs Intravenous Bolus 01/05/23 1730)     IMPRESSION / MDM / ASSESSMENT AND PLAN / ED COURSE  I reviewed the triage vital signs and the nursing notes.                              Differential diagnosis includes, but is not limited to, seizure-like activity, gastroparesis, viral GI infection, cholecystitis, pancreatitis, SBO  Patient's presentation is most consistent with acute complicated illness / injury requiring diagnostic workup.  Patient is a 57 year old female presenting today for nausea and vomiting symptoms with generalized abdominal pain over the past 2 days.  Also reportedly had seizure-like activity on the way to the ED witnessed by son.  However she had no postictal period or confusion after it.  Episode lasted less than 45 seconds.  Reportedly has a seizure history but was taken off Lamictal 2 years ago.  Patient was observed in the ED for 4 hours with no repeat seizure activity and otherwise no confusion.  Laboratory workup initially notable for leukocytosis.  No evidence of UTI.  CMP otherwise reassuring and patient was given fluid hydration and nausea medication here in the ED.  CT shows no acute intra-abdominal pathology.  Patient was reassessed with complete resolution of symptoms.  No ongoing abdominal pain.  Tolerating p.o. without issue.  Although she does have a notable leukocytosis, there is no obvious infectious source at this time and this could be potentially related to recurrent nausea and vomiting x 2 days versus her potential seizure-like activity.  Given that her vital signs are otherwise stable and physical exam unremarkable, I do think she is safe for discharge at this time and will have her follow-up with her primary care provider for a scheduled appointment in 2 days.  Patient was agreeable with this plan and given strict return precautions.  She was also  given neurology follow-up.  The patient is on the cardiac monitor to evaluate for evidence of arrhythmia and/or significant heart rate changes. Clinical Course as of 01/05/23 1913  Wed Jan 05, 2023  1603 Comprehensive metabolic panel(!) Mild hypokalemia but otherwise unremarkable [DW]  1606 WBC(!): 25.1 [DW]  1612 Neurology note from 2022 stated that patient was on Lamictal at that time for seizures. [DW]  1633 Urinalysis, Routine w reflex microscopic -Urine, Clean Catch(!) No obvious UTI [DW]  1911 Patient  tolerated PO. Wanting to go home at this time [DW]    Clinical Course User Index [DW] Janith Lima, MD     FINAL CLINICAL IMPRESSION(S) / ED DIAGNOSES   Final diagnoses:  Nausea and vomiting, unspecified vomiting type  Seizure-like activity (HCC)     Rx / DC Orders   ED Discharge Orders          Ordered    Ambulatory referral to Neurology       Comments: An appointment is requested in approximately: 1 week  History of seizures.  Taken off medications 2 years ago.  Possibly had a seizure but nothing seen in the ED   01/05/23 1912    ondansetron (ZOFRAN) 4 MG tablet  Every 8 hours PRN        01/05/23 1912             Note:  This document was prepared using Dragon voice recognition software and may include unintentional dictation errors.   Janith Lima, MD 01/05/23 [phone]

## 2023-01-05 NOTE — ED Triage Notes (Signed)
Pt presents to ED with c/o of have a seizure while on the way to the hospital with family. EMS states son pulled over and called them due to seizure activity in the back of vehicle. EMS states pt has a HX of seizures and TBI (15 years ago). Pt denies any injury from seizure. Pt is A&Ox4. Pt c/o of chronic ABD pain from gastroparesis.   Pt states neurologist took pt off seizure meds about 1 hour ago.   Pt does endorse nausea and is dry heaving at this time. Pt states she has been vomiting \\for  the past 2 days.

## 2023-01-05 NOTE — ED Notes (Signed)
Pt given cola for PO challenge per MD request.

## 2023-01-05 NOTE — Discharge Instructions (Signed)
I have sent nausea medication to your pharmacy to help with your symptoms.  The rest your workup is reassuring.  Please follow-up with your regular doctor and return for any worsening symptoms.  I also sent a referral for you to see a new neurologist and they can discuss whether you need to go back on seizure medication.  Please return for any new seizure activity.

## 2023-01-07 ENCOUNTER — Ambulatory Visit: Payer: 59 | Admitting: Family Medicine

## 2023-01-07 ENCOUNTER — Emergency Department
Admission: EM | Admit: 2023-01-07 | Discharge: 2023-01-07 | Disposition: A | Discharge status: Home / Self Care | Payer: 59 | Attending: Emergency Medicine | Admitting: Emergency Medicine

## 2023-01-07 ENCOUNTER — Other Ambulatory Visit: Payer: Self-pay

## 2023-01-07 ENCOUNTER — Emergency Department: Payer: 59

## 2023-01-07 ENCOUNTER — Ambulatory Visit: Payer: 59 | Admitting: Nurse Practitioner

## 2023-01-07 DIAGNOSIS — R1110 Vomiting, unspecified: Secondary | ICD-10-CM | POA: Insufficient documentation

## 2023-01-07 DIAGNOSIS — R1011 Right upper quadrant pain: Secondary | ICD-10-CM | POA: Diagnosis not present

## 2023-01-07 DIAGNOSIS — R569 Unspecified convulsions: Secondary | ICD-10-CM | POA: Diagnosis not present

## 2023-01-07 DIAGNOSIS — R1010 Upper abdominal pain, unspecified: Secondary | ICD-10-CM

## 2023-01-07 LAB — CBC WITH DIFFERENTIAL/PLATELET
Abs Immature Granulocytes: 0.05 10*3/uL (ref 0.00–0.07)
Basophils Absolute: 0 10*3/uL (ref 0.0–0.1)
Basophils Relative: 0 %
Eosinophils Absolute: 0 10*3/uL (ref 0.0–0.5)
Eosinophils Relative: 0 %
HCT: 37.2 % (ref 36.0–46.0)
Hemoglobin: 12.9 g/dL (ref 12.0–15.0)
Immature Granulocytes: 0 %
Lymphocytes Relative: 29 %
Lymphs Abs: 4 10*3/uL (ref 0.7–4.0)
MCH: 31.7 pg (ref 26.0–34.0)
MCHC: 34.7 g/dL (ref 30.0–36.0)
MCV: 91.4 fL (ref 80.0–100.0)
Monocytes Absolute: 1.3 10*3/uL — ABNORMAL HIGH (ref 0.1–1.0)
Monocytes Relative: 9 %
Neutro Abs: 8.7 10*3/uL — ABNORMAL HIGH (ref 1.7–7.7)
Neutrophils Relative %: 62 %
Platelets: 277 10*3/uL (ref 150–400)
RBC: 4.07 MIL/uL (ref 3.87–5.11)
RDW: 12.8 % (ref 11.5–15.5)
Smear Review: NORMAL
WBC: 14.1 10*3/uL — ABNORMAL HIGH (ref 4.0–10.5)
nRBC: 0 % (ref 0.0–0.2)

## 2023-01-07 LAB — COMPREHENSIVE METABOLIC PANEL
ALT: 54 U/L — ABNORMAL HIGH (ref 0–44)
AST: 67 U/L — ABNORMAL HIGH (ref 15–41)
Albumin: 3.5 g/dL (ref 3.5–5.0)
Alkaline Phosphatase: 78 U/L (ref 38–126)
Anion gap: 11 (ref 5–15)
BUN: 12 mg/dL (ref 6–20)
CO2: 25 mmol/L (ref 22–32)
Calcium: 8.4 mg/dL — ABNORMAL LOW (ref 8.9–10.3)
Chloride: 101 mmol/L (ref 98–111)
Creatinine, Ser: 0.62 mg/dL (ref 0.44–1.00)
GFR, Estimated: >60 mL/min (ref >60)
Glucose, Bld: 105 mg/dL — ABNORMAL HIGH (ref 70–99)
Potassium: 3.2 mmol/L — ABNORMAL LOW (ref 3.5–5.1)
Sodium: 137 mmol/L (ref 135–145)
Total Bilirubin: 0.5 mg/dL (ref <1.2)
Total Protein: 6.1 g/dL — ABNORMAL LOW (ref 6.5–8.1)

## 2023-01-07 LAB — LIPASE, BLOOD: Lipase: 33 U/L (ref 11–51)

## 2023-01-07 MED ORDER — SODIUM CHLORIDE 0.9 % IV BOLUS
1000.0000 mL | Freq: Once | INTRAVENOUS | Status: AC
  Administered 2023-01-07: 1000 mL via INTRAVENOUS

## 2023-01-07 MED ORDER — POTASSIUM CHLORIDE CRYS ER 20 MEQ PO TBCR
40.0000 meq | EXTENDED_RELEASE_TABLET | Freq: Once | ORAL | Status: AC
  Administered 2023-01-07: 40 meq via ORAL
  Filled 2023-01-07: qty 2

## 2023-01-07 MED ORDER — MORPHINE SULFATE (PF) 4 MG/ML IV SOLN
4.0000 mg | Freq: Once | INTRAVENOUS | Status: AC
  Administered 2023-01-07: 4 mg via INTRAVENOUS
  Filled 2023-01-07: qty 1

## 2023-01-07 MED ORDER — PANTOPRAZOLE SODIUM 40 MG IV SOLR
40.0000 mg | Freq: Once | INTRAVENOUS | Status: AC
  Administered 2023-01-07: 40 mg via INTRAVENOUS
  Filled 2023-01-07: qty 10

## 2023-01-07 MED ORDER — METOCLOPRAMIDE HCL 5 MG/ML IJ SOLN
10.0000 mg | INTRAMUSCULAR | Status: AC
  Administered 2023-01-07: 10 mg via INTRAVENOUS
  Filled 2023-01-07: qty 2

## 2023-01-07 MED ORDER — DROPERIDOL 2.5 MG/ML IJ SOLN
2.5000 mg | Freq: Once | INTRAMUSCULAR | Status: AC
  Administered 2023-01-07: 2.5 mg via INTRAVENOUS
  Filled 2023-01-07: qty 2

## 2023-01-07 NOTE — ED Triage Notes (Signed)
BIBEMS, coming from home. Pt reports 2 seizures tonight, last night was 30 mins ago. Not witnessed with EMS. PMH: TBI with L sided deficits. Pt was seen yesterday. 90/60 initially with EMS. 96/68 last BP. 98% RA, BGL: 131, 18G RAC. of NS given. GCS 15

## 2023-01-07 NOTE — ED Provider Notes (Signed)
   [facility] Provider Note    Event Date/Time   First MD Initiated Contact with Patient 01/07/23 [phone]     (approximate)   History   Chief Complaint: Seizures   HPI  [name] is a 57 y.o. female with a history of seizures, TBI, gastroparesis who comes ED complaining of upper abdominal pain and persistent vomiting for the last few days.  Was seen in the ED yesterday, had CT scan and labs which were reassuring, was discharged home.  She reports since returning home symptoms have persisted, not able to eat or drink.  Reports having recurrent seizures 2 days ago.          Physical Exam   Triage Vital Signs: ED Triage Vitals [01/07/23 0556]  Encounter Vitals Group     BP (!) 89/74     Systolic BP Percentile      Diastolic BP Percentile      Pulse Rate 79     Resp 18     Temp 98.4 F (36.9 C)     Temp src      SpO2 99 %     Weight      Height      Head Circumference      Peak Flow      Pain Score      Pain Loc      Pain Education      Exclude from Growth Chart     Most recent vital signs: Vitals:   01/07/23 0556 01/07/23 0557  BP: (!) 89/74   Pulse: 79   Resp: 18   Temp: 98.4 F (36.9 C)   SpO2: 99% 98%    General: Awake, no distress.  CV:  Good peripheral perfusion.  Regular rate rhythm Resp:  Normal effort.  Clear to auscultation bilaterally Abd:  No distention.  Soft with right upper quadrant tenderness Other:  Dry oral mucosa   ED Results / Procedures / Treatments   Labs (all labs ordered are listed, but only abnormal results are displayed) Labs Reviewed  COMPREHENSIVE METABOLIC PANEL  LIPASE, BLOOD  CBC WITH DIFFERENTIAL/PLATELET     EKG    RADIOLOGY Ultrasound right upper quadrant pending   PROCEDURES:  Procedures   MEDICATIONS ORDERED IN ED: Medications  sodium chloride 0.9 % bolus 1,000 mL (has no administration in time range)  metoCLOPramide (REGLAN) injection 10 mg (has no administration in  time range)  pantoprazole (PROTONIX) injection 40 mg (has no administration in time range)  morphine (PF) 4 MG/ML injection 4 mg (has no administration in time range)     IMPRESSION / MDM / ASSESSMENT AND PLAN / ED COURSE  I reviewed the triage vital signs and the nursing notes.  DDx: Choledocholithiasis, cholecystitis, cholelithiasis, dehydration, AKI, electrolyte abnormality, pancreatitis, gastritis  Patient's presentation is most consistent with acute presentation with potential threat to life or bodily function.  Patient presents with upper abdominal pain, vomiting.  She is diabetic.  Will give IV fluids, antacids, IV morphine while obtaining labs, right upper quadrant ultrasound.       FINAL CLINICAL IMPRESSION(S) / ED DIAGNOSES   Final diagnoses:  Upper abdominal pain     Rx / DC Orders   ED Discharge Orders     None        Note:  This document was prepared using Dragon voice recognition software and may include unintentional dictation errors.   Sharman Cheek, MD 01/07/23 [phone]

## 2023-01-07 NOTE — ED Provider Notes (Signed)
Emergency department handoff note  Care of this patient was signed out to me at the end of the previous provider shift.  All pertinent patient information was conveyed and all questions were answered.  Patient pending results of CT of the abdomen and pelvis with IV contrast that did not show any evidence of acute abnormalities.  Patient is p.o. tolerant  Prior to reassessment or discussion of these results, patient left without receiving discharge papers.  Patient was discharged at this time and is stable for discharge home.  Patient ambulatory without difficulty and all IVs removed.   Merwyn Katos, MD 01/07/23 [phone]

## 2023-01-07 NOTE — ED Notes (Signed)
Pt is irritated, stating she would like to go home right now. Pacing around room. MD notified Pt wanted to leave, IV removed. Pt is GCS 15, family @bedside . Pt refused discharge VS. Placed in wheelchair and placed in vehicle home

## 2023-01-12 ENCOUNTER — Ambulatory Visit (INDEPENDENT_AMBULATORY_CARE_PROVIDER_SITE_OTHER): Payer: 59 | Admitting: Nurse Practitioner

## 2023-01-12 ENCOUNTER — Encounter: Payer: Self-pay | Admitting: Nurse Practitioner

## 2023-01-12 ENCOUNTER — Other Ambulatory Visit: Payer: Self-pay

## 2023-01-12 VITALS — BP 120/72 | HR 86 | Temp 97.9°F | Resp 18 | Ht 68.0 in | Wt 119.8 lb

## 2023-01-12 DIAGNOSIS — R109 Unspecified abdominal pain: Secondary | ICD-10-CM | POA: Diagnosis not present

## 2023-01-12 DIAGNOSIS — R112 Nausea with vomiting, unspecified: Secondary | ICD-10-CM | POA: Diagnosis not present

## 2023-01-12 DIAGNOSIS — R569 Unspecified convulsions: Secondary | ICD-10-CM

## 2023-01-12 DIAGNOSIS — R197 Diarrhea, unspecified: Secondary | ICD-10-CM

## 2023-01-12 MED ORDER — METOCLOPRAMIDE HCL 10 MG PO TABS: 10.0000 mg | ORAL_TABLET | Freq: Three times a day (TID) | ORAL | 0 refills | Status: DC | PRN

## 2023-01-12 NOTE — Progress Notes (Signed)
BP 120/72   Pulse 86   Temp 97.9 F (36.6 C) (Oral)   Resp 18   Ht 5\' 8"  (1.727 m)   Wt 119 lb 12.8 oz (54.3 kg)   SpO2 96%   BMI 18.22 kg/m    Subjective:    Patient ID: [name], female    DOB: June 04, 1965, 57 y.o.   MRN: [id number]  HPI: [name] is a 57 y.o. female  Chief Complaint  Patient presents with   Follow-up    ER 2 times in 1 week   ER follow up/ abdominal pain/seizure like activity: patient has been seen twice in er in the last week.  Once on 01/05/2023 and again on 01/07/2023. Er note 01/05/2023: [name] is a 57 y.o. female with history of TBI, seizures, gastroparesis, HTN presenting today for abdominal pain and seizures.  Patient states over the past 2 days she has not felt well with worsening abdominal pain and associated nausea and vomiting.  Abdominal pain is all over.  She correlates it with her chronic gastroparesis.  She was coming to the emergency department for further evaluation when reportedly and route she had 2 seizures.  Reports she was recently taken off of her seizure medications by her neurology team.  Otherwise denies fever, chills, shortness of breath, chest pain, diarrhea, dysuria.   Chart review: No recent chart notes to verify that she was recently on seizure medication.  Do see a history of TBI, mood disorder, and PTSD.  Patient is a 57 year old female presenting today for nausea and vomiting symptoms with generalized abdominal pain over the past 2 days. Also reportedly had seizure-like activity on the way to the ED witnessed by son. However she had no postictal period or confusion after it. Episode lasted less than 45 seconds. Reportedly has a seizure history but was taken off Lamictal 2 years ago. Patient was observed in the ED for 4 hours with no repeat seizure activity and otherwise no confusion. Laboratory workup initially notable for leukocytosis. No evidence of UTI. CMP otherwise reassuring and patient was given fluid hydration and  nausea medication here in the ED. CT shows no acute intra-abdominal pathology. Patient was reassessed with complete resolution of symptoms. No ongoing abdominal pain. Tolerating p.o. without issue. Although she does have a notable leukocytosis, there is no obvious infectious source at this time and this could be potentially related to recurrent nausea and vomiting x 2 days versus her potential seizure-like activity. Given that her vital signs are otherwise stable and physical exam unremarkable, I do think she is safe for discharge at this time and will have her follow-up with her primary care provider for a scheduled appointment in 2 days. Patient was agreeable with this plan and given strict return precautions. She was also given neurology follow-up.   Er note 01/07/2023:[name] is a 57 y.o. female with a history of seizures, TBI, gastroparesis who comes ED complaining of upper abdominal pain and persistent vomiting for the last few days.  Was seen in the ED yesterday, had CT scan and labs which were reassuring, was discharged home.  She reports since returning home symptoms have persisted, not able to eat or drink.  Reports having recurrent seizures 2 days ago. Care of this patient was signed out to me at the end of the previous provider shift.  All pertinent patient information was conveyed and all questions were answered.  Patient pending results of CT of the abdomen and pelvis  with IV contrast that did not show any evidence of acute abnormalities.  Patient is p.o. tolerant   Prior to reassessment or discussion of these results, patient left without receiving discharge papers.  Patient was discharged at this time and is stable for discharge home.  Patient ambulatory without difficulty and all IVs removed.  Ct abdomen: T ABDOMEN AND PELVIS WITHOUT CONTRAST   TECHNIQUE: Multidetector CT imaging of the abdomen and pelvis was performed following the standard protocol without IV contrast.   RADIATION  DOSE REDUCTION: This exam was performed according to the departmental dose-optimization program which includes automated exposure control, adjustment of the mA and/or kV according to patient size and/or use of iterative reconstruction technique.   COMPARISON:  CT abdomen pelvis dated 05/07/2022.   FINDINGS: Evaluation of this exam is limited in the absence of intravenous contrast.   Lower chest: The visualized lung bases are clear.   No intra-abdominal free air or free fluid.   Hepatobiliary: The liver is unremarkable. No biliary dilatation. The gallbladder is unremarkable.   Pancreas: Unremarkable. No pancreatic ductal dilatation or surrounding inflammatory changes.   Spleen: Normal in size without focal abnormality.   Adrenals/Urinary Tract: The adrenal glands are unremarkable. There is no hydronephrosis or nephrolithiasis on either side. The visualized ureters and urinary bladder appear unremarkable.   Stomach/Bowel: There is no bowel obstruction or active inflammation. Appendectomy.   Vascular/Lymphatic: Moderate aortoiliac atherosclerotic disease. The IVC is unremarkable. No portal venous gas. There is no adenopathy.   Reproductive: The uterus and ovaries are grossly appropriate no adnexal masses.   Other: None   Musculoskeletal: Degenerative changes of the lower lumbar spine. No acute osseous pathology.   IMPRESSION: 1. No acute intra-abdominal or pelvic pathology. No hydronephrosis or nephrolithiasis. 2.  Aortic Atherosclerosis (ICD10-I70.0).  Abdominal ultrasound:  EXAM: ULTRASOUND ABDOMEN LIMITED RIGHT UPPER QUADRANT   COMPARISON:  Abdominal CT from 2 days ago   FINDINGS: Gallbladder:   No gallstones or wall thickening visualized. No sonographic Murphy sign noted by sonographer.   Common bile duct:   Diameter: 2 mm   Liver:   No focal lesion identified. Within normal limits in parenchymal echogenicity. Portal vein is patent on color Doppler  imaging with normal direction of blood flow towards the liver.   IMPRESSION: Normal right upper quadrant ultrasound.   Referral to neurology for seizure like activity placed by er to [facility]. Patient has an appointment today at 2pm.   Last saw neurology at [facility] on 04/28/2020.   Patient is established with GI, last seen on 07/13/2022, recommend following up with them.  She was also give a prescription for zofran from er. She reports reglan works better for her. Prescription sent.   Relevant past medical, surgical, family and social history reviewed and updated as indicated. Interim medical history since our last visit reviewed. Allergies and medications reviewed and updated.  Review of Systems  Constitutional: Negative for fever or weight change.  Respiratory: Negative for cough and shortness of breath.   Cardiovascular: Negative for chest pain or palpitations.  Gastrointestinal: positive for abdominal pain, no bowel changes.  Musculoskeletal: Negative for gait problem or joint swelling.  Skin: Negative for rash.  Neurological: Negative for dizziness or headache.  No other specific complaints in a complete review of systems (except as listed in HPI above).      Objective:    BP 120/72   Pulse 86   Temp 97.9 F (36.6 C) (Oral)   Resp 18  Ht 5\' 8"  (1.727 m)   Wt 119 lb 12.8 oz (54.3 kg)   SpO2 96%   BMI 18.22 kg/m   Wt Readings from Last 3 Encounters:  01/12/23 119 lb 12.8 oz (54.3 kg)  01/05/23 117 lb (53.1 kg)  11/08/22 120 lb 9.6 oz (54.7 kg)    Physical Exam  Constitutional: Patient appears well-developed and well-nourished.  No distress.  HEENT: head atraumatic, normocephalic, pupils equal and reactive to light, neck supple Cardiovascular: Normal rate, regular rhythm and normal heart sounds.  No murmur heard. No BLE edema. Pulmonary/Chest: Effort normal and breath sounds normal. No respiratory distress. Abdominal: Soft.  There is no  tenderness. Psychiatric: Patient has a normal mood and affect. behavior is normal. Judgment and thought content normal.  Results for orders placed or performed during the hospital encounter of 01/07/23  Comprehensive metabolic panel  Result Value Ref Range   Sodium 137 135 - 145 mmol/L   Potassium 3.2 (L) 3.5 - 5.1 mmol/L   Chloride 101 98 - 111 mmol/L   CO2 25 22 - 32 mmol/L   Glucose, Bld 105 (H) 70 - 99 mg/dL   BUN 12 6 - 20 mg/dL   Creatinine, Ser 5.18 0.44 - 1.00 mg/dL   Calcium 8.4 (L) 8.9 - 10.3 mg/dL   Total Protein 6.1 (L) 6.5 - 8.1 g/dL   Albumin 3.5 3.5 - 5.0 g/dL   AST 67 (H) 15 - 41 U/L   ALT 54 (H) 0 - 44 U/L   Alkaline Phosphatase 78 38 - 126 U/L   Total Bilirubin 0.5 <1.2 mg/dL   GFR, Estimated >84 >16 mL/min   Anion gap 11 5 - 15  Lipase, blood  Result Value Ref Range   Lipase 33 11 - 51 U/L  CBC with Differential  Result Value Ref Range   WBC 14.1 (H) 4.0 - 10.5 K/uL   RBC 4.07 3.87 - 5.11 MIL/uL   Hemoglobin 12.9 12.0 - 15.0 g/dL   HCT 60.6 30.1 - 60.1 %   MCV 91.4 80.0 - 100.0 fL   MCH 31.7 26.0 - 34.0 pg   MCHC 34.7 30.0 - 36.0 g/dL   RDW 09.3 23.5 - 57.3 %   Platelets 277 150 - 400 K/uL   nRBC 0.0 0.0 - 0.2 %   Neutrophils Relative % 62 %   Neutro Abs 8.7 (H) 1.7 - 7.7 K/uL   Lymphocytes Relative 29 %   Lymphs Abs 4.0 0.7 - 4.0 K/uL   Monocytes Relative 9 %   Monocytes Absolute 1.3 (H) 0.1 - 1.0 K/uL   Eosinophils Relative 0 %   Eosinophils Absolute 0.0 0.0 - 0.5 K/uL   Basophils Relative 0 %   Basophils Absolute 0.0 0.0 - 0.1 K/uL   RBC Morphology MORPHOLOGY UNREMARKABLE    Smear Review Normal platelet morphology    Immature Granulocytes 0 %   Abs Immature Granulocytes 0.05 0.00 - 0.07 K/uL   Reactive, Benign Lymphocytes PRESENT       Assessment & Plan:   Problem List Items Addressed This Visit   None Visit Diagnoses     Abdominal pain, unspecified abdominal location    -  Primary   follow up with GI, reglan sent in   Relevant  Medications   metoCLOPramide (REGLAN) 10 MG tablet   Nausea, vomiting and diarrhea       follow up with GI, reglan sent in   Relevant Medications   metoCLOPramide (REGLAN) 10 MG tablet  Seizure-like activity (HCC)       has appointment with neurology at 2 pm        Follow up plan: No follow-ups on file.

## 2023-01-21 ENCOUNTER — Telehealth: Payer: Self-pay

## 2023-01-21 NOTE — Telephone Encounter (Signed)
Transition Care Management Unsuccessful Follow-up Telephone Call  Date of discharge and from where:  01/07/2023 [facility]  Attempts:  1st Attempt  Reason for unsuccessful TCM follow-up call:  Left voice message  Sun Wilensky [facility]  [facility], [facility] Resource Care Guide Direct Dial: [phone]  Website: [email or web address]

## 2023-01-24 ENCOUNTER — Telehealth: Payer: Self-pay

## 2023-01-24 NOTE — Telephone Encounter (Signed)
Transition Care Management Unsuccessful Follow-up Telephone Call  Date of discharge and from where:  01/07/2023 [facility]  Attempts:  2nd Attempt  Reason for unsuccessful TCM follow-up call:  Left voice message  [name] [facility]  [facility], [facility] Resource Care Guide Direct Dial: [phone]  Website: [email or web address]

## 2023-02-17 ENCOUNTER — Other Ambulatory Visit: Payer: Self-pay | Admitting: Family Medicine

## 2023-02-17 DIAGNOSIS — Z1231 Encounter for screening mammogram for malignant neoplasm of breast: Secondary | ICD-10-CM

## 2023-03-08 ENCOUNTER — Other Ambulatory Visit: Payer: Self-pay | Admitting: Family Medicine

## 2023-03-08 ENCOUNTER — Ambulatory Visit
Admission: RE | Admit: 2023-03-08 | Discharge: 2023-03-08 | Disposition: A | Discharge status: Home / Self Care | Payer: 59 | Source: Ambulatory Visit | Attending: Family Medicine | Admitting: Family Medicine

## 2023-03-08 DIAGNOSIS — Z1231 Encounter for screening mammogram for malignant neoplasm of breast: Secondary | ICD-10-CM | POA: Insufficient documentation

## 2023-03-08 DIAGNOSIS — N6310 Unspecified lump in the right breast, unspecified quadrant: Secondary | ICD-10-CM

## 2023-03-08 DIAGNOSIS — E782 Mixed hyperlipidemia: Secondary | ICD-10-CM

## 2023-03-20 ENCOUNTER — Other Ambulatory Visit: Payer: Self-pay | Admitting: Family Medicine

## 2023-03-20 DIAGNOSIS — F0630 Mood disorder due to known physiological condition, unspecified: Secondary | ICD-10-CM

## 2023-03-20 DIAGNOSIS — F482 Pseudobulbar affect: Secondary | ICD-10-CM

## 2023-03-21 NOTE — Telephone Encounter (Signed)
Lvm letting pt know that medication was denied due to her needing to be seen

## 2023-03-21 NOTE — Telephone Encounter (Signed)
Needs appt

## 2023-04-13 ENCOUNTER — Other Ambulatory Visit: Payer: Self-pay | Admitting: Family Medicine

## 2023-04-13 DIAGNOSIS — E782 Mixed hyperlipidemia: Secondary | ICD-10-CM

## 2023-04-18 ENCOUNTER — Other Ambulatory Visit: Payer: Self-pay | Admitting: Family Medicine

## 2023-04-18 DIAGNOSIS — F482 Pseudobulbar affect: Secondary | ICD-10-CM

## 2023-04-18 DIAGNOSIS — S069XAS Unspecified intracranial injury with loss of consciousness status unknown, sequela: Secondary | ICD-10-CM

## 2023-04-18 NOTE — Telephone Encounter (Signed)
Tried calling pt and it is a non working number

## 2023-04-18 NOTE — Telephone Encounter (Signed)
Will need 3 month f/u

## 2023-05-12 ENCOUNTER — Ambulatory Visit
Admission: RE | Admit: 2023-05-12 | Discharge: 2023-05-12 | Disposition: A | Discharge status: Home / Self Care | Payer: 59 | Source: Ambulatory Visit | Attending: Family Medicine | Admitting: Family Medicine

## 2023-05-12 DIAGNOSIS — N6310 Unspecified lump in the right breast, unspecified quadrant: Secondary | ICD-10-CM | POA: Diagnosis present

## 2023-05-12 DIAGNOSIS — N6341 Unspecified lump in right breast, subareolar: Secondary | ICD-10-CM | POA: Insufficient documentation

## 2023-05-12 DIAGNOSIS — Z1231 Encounter for screening mammogram for malignant neoplasm of breast: Secondary | ICD-10-CM | POA: Diagnosis present

## 2023-05-12 DIAGNOSIS — N6320 Unspecified lump in the left breast, unspecified quadrant: Secondary | ICD-10-CM | POA: Diagnosis present

## 2023-05-12 DIAGNOSIS — N6342 Unspecified lump in left breast, subareolar: Secondary | ICD-10-CM | POA: Diagnosis not present

## 2023-05-16 ENCOUNTER — Other Ambulatory Visit: Payer: Self-pay | Admitting: Family Medicine

## 2023-05-16 ENCOUNTER — Other Ambulatory Visit: Payer: Self-pay | Admitting: Gastroenterology

## 2023-05-16 DIAGNOSIS — E782 Mixed hyperlipidemia: Secondary | ICD-10-CM

## 2023-05-23 ENCOUNTER — Ambulatory Visit
Admission: EM | Admit: 2023-05-23 | Discharge: 2023-05-23 | Disposition: A | Discharge status: Home / Self Care | Attending: Emergency Medicine | Admitting: Emergency Medicine

## 2023-05-23 ENCOUNTER — Emergency Department
Admission: EM | Admit: 2023-05-23 | Discharge: 2023-05-24 | Disposition: A | Discharge status: Home / Self Care | Attending: Emergency Medicine | Admitting: Emergency Medicine

## 2023-05-23 DIAGNOSIS — R52 Pain, unspecified: Secondary | ICD-10-CM

## 2023-05-23 DIAGNOSIS — R112 Nausea with vomiting, unspecified: Secondary | ICD-10-CM

## 2023-05-23 DIAGNOSIS — R569 Unspecified convulsions: Secondary | ICD-10-CM | POA: Insufficient documentation

## 2023-05-23 DIAGNOSIS — R197 Diarrhea, unspecified: Secondary | ICD-10-CM | POA: Insufficient documentation

## 2023-05-23 DIAGNOSIS — J101 Influenza due to other identified influenza virus with other respiratory manifestations: Secondary | ICD-10-CM | POA: Insufficient documentation

## 2023-05-23 DIAGNOSIS — R509 Fever, unspecified: Secondary | ICD-10-CM

## 2023-05-23 LAB — COMPREHENSIVE METABOLIC PANEL
ALT: 19 U/L (ref 0–44)
AST: 26 U/L (ref 15–41)
Albumin: 4.4 g/dL (ref 3.5–5.0)
Alkaline Phosphatase: 97 U/L (ref 38–126)
Anion gap: 13 (ref 5–15)
BUN: 12 mg/dL (ref 6–20)
CO2: 19 mmol/L — ABNORMAL LOW (ref 22–32)
Calcium: 9.8 mg/dL (ref 8.9–10.3)
Chloride: 103 mmol/L (ref 98–111)
Creatinine, Ser: 0.64 mg/dL (ref 0.44–1.00)
GFR, Estimated: >60 mL/min (ref >60)
Glucose, Bld: 113 mg/dL — ABNORMAL HIGH (ref 70–99)
Potassium: 3 mmol/L — ABNORMAL LOW (ref 3.5–5.1)
Sodium: 135 mmol/L (ref 135–145)
Total Bilirubin: 0.3 mg/dL (ref 0.0–1.2)
Total Protein: 7.9 g/dL (ref 6.5–8.1)

## 2023-05-23 LAB — CBC WITH DIFFERENTIAL/PLATELET
Abs Immature Granulocytes: 0.01 10*3/uL (ref 0.00–0.07)
Basophils Absolute: 0 10*3/uL (ref 0.0–0.1)
Basophils Relative: 0 %
Eosinophils Absolute: 0 10*3/uL (ref 0.0–0.5)
Eosinophils Relative: 0 %
HCT: 46.7 % — ABNORMAL HIGH (ref 36.0–46.0)
Hemoglobin: 16.4 g/dL — ABNORMAL HIGH (ref 12.0–15.0)
Immature Granulocytes: 0 %
Lymphocytes Relative: 49 %
Lymphs Abs: 3.1 10*3/uL (ref 0.7–4.0)
MCH: 30.7 pg (ref 26.0–34.0)
MCHC: 35.1 g/dL (ref 30.0–36.0)
MCV: 87.3 fL (ref 80.0–100.0)
Monocytes Absolute: 0.7 10*3/uL (ref 0.1–1.0)
Monocytes Relative: 10 %
Neutro Abs: 2.7 10*3/uL (ref 1.7–7.7)
Neutrophils Relative %: 41 %
Platelets: 295 10*3/uL (ref 150–400)
RBC: 5.35 MIL/uL — ABNORMAL HIGH (ref 3.87–5.11)
RDW: 13.4 % (ref 11.5–15.5)
WBC: 6.6 10*3/uL (ref 4.0–10.5)
nRBC: 0 % (ref 0.0–0.2)

## 2023-05-23 LAB — RESP PANEL BY RT-PCR (RSV, FLU A&B, COVID)  RVPGX2
Influenza A by PCR: POSITIVE — AB
Influenza B by PCR: NEGATIVE
Resp Syncytial Virus by PCR: NEGATIVE
SARS Coronavirus 2 by RT PCR: NEGATIVE

## 2023-05-23 MED ORDER — ONDANSETRON HCL 4 MG/2ML IJ SOLN
4.0000 mg | Freq: Once | INTRAMUSCULAR | Status: AC
  Administered 2023-05-23: 4 mg via INTRAVENOUS
  Filled 2023-05-23: qty 2

## 2023-05-23 MED ORDER — LAMOTRIGINE 25 MG PO TABS: 25.0000 mg | ORAL_TABLET | Freq: Two times a day (BID) | ORAL | 0 refills | Status: AC

## 2023-05-23 MED ORDER — IBUPROFEN 600 MG PO TABS
600.0000 mg | ORAL_TABLET | Freq: Once | ORAL | Status: DC
  Filled 2023-05-23: qty 1

## 2023-05-23 MED ORDER — ONDANSETRON 8 MG PO TBDP: 8.0000 mg | ORAL_TABLET | Freq: Three times a day (TID) | ORAL | 0 refills | Status: DC | PRN

## 2023-05-23 MED ORDER — SODIUM CHLORIDE 0.9 % IV BOLUS
1000.0000 mL | Freq: Once | INTRAVENOUS | Status: AC
  Administered 2023-05-23: 1000 mL via INTRAVENOUS

## 2023-05-23 MED ORDER — LEVETIRACETAM IN NACL 1000 MG/100ML IV SOLN
1000.0000 mg | Freq: Once | INTRAVENOUS | Status: AC
  Administered 2023-05-23: 1000 mg via INTRAVENOUS
  Filled 2023-05-23: qty 100

## 2023-05-23 NOTE — ED Notes (Addendum)
 Pt sitting in recliner, alert with no distress noted; female visitor with pt reports "45 second seizure" just prior to my arrival; pt request med for nausea; also requesting to speak with charge nurse regarding [facility] wait time--charge nurse notified

## 2023-05-23 NOTE — ED Triage Notes (Signed)
 Pt presents to the ED via POV from home. Pt is having flu-like symptoms that started on Thursday. Pt has a hx of seizures and had two seizures at [facility]. Pt also has a hx of a TBI. Pt reports N/V/D and chills.   Pt evaluated at time of triage by Logan Bores, PA. Orders placed per providers VO.

## 2023-05-23 NOTE — ED Notes (Addendum)
 Given patients hx of seizures, pt and family placed in subwait with call bell and IV in place. First nurse aware.

## 2023-05-23 NOTE — ED Provider Triage Note (Signed)
 Emergency Medicine Provider Triage Evaluation Note  [name] , a 58 y.o. female  was evaluated in triage.  Pt complains of active cough, diarrhea, nauseas.  Patient states light is bothering her.  Before arriving to ED she had 2 seizures..  Review of Systems  Positive:  Negative:   Physical Exam  There were no vitals taken for this visit. Gen:   Awake, no distress   Resp:  Normal effort  MSK:   Moves extremities without difficulty Other:    Medical Decision Making  Medically screening exam initiated at 6:35 PM.  Appropriate orders placed.  Pincus Sanes Huard was informed that the remainder of the evaluation will be completed by another provider, this initial triage assessment does not replace that evaluation, and the importance of remaining in the ED until their evaluation is complete.  Patient with flulike symptoms ordered CBC CMP expiratory panel UA   Gladys Damme, PA-C 05/23/23 1837

## 2023-05-23 NOTE — ED Notes (Signed)
 RN to subwait area to speak with pt and family due to pts wait time and delayed response of call bell. Pt and family report that no one has checked on pt since arriving and sat in wait area. Pt also upset at extended time staff took to answer pts call bell in which pt sts it was over 20 minutes. Writer provided reassurance and understanding to concerns listed. Writer also reached out to provider that originally competed an MSE to see if available for re-assessment now that labs have resulted and pts family reports of 4 seizures since in wait area.

## 2023-05-23 NOTE — ED Provider Notes (Signed)
 [facility] Provider Note   Event Date/Time   First MD Initiated Contact with Patient 05/23/23 2119     (approximate) History  flu like symptoms and Seizures  HPI [name] is a 58 y.o. female with stated past medical history of seizures, TBI, and mood disorder with PTSD and depression who presents complaining of generalized muscle aches, nausea/vomiting/diarrhea, chills/fevers.  Patient also states that she had 2 episodes of seizure-like activity at urgent care prior to arrival however there is no documentation of these events. ROS: Patient currently denies any vision changes, tinnitus, difficulty speaking, facial droop, sore throat, chest pain, shortness of breath, abdominal pain, dysuria, or weakness/numbness/paresthesias in any extremity   Physical Exam  Triage Vital Signs: ED Triage Vitals [05/23/23 1836]  Encounter Vitals Group     BP (!) 117/93     Systolic BP Percentile      Diastolic BP Percentile      Pulse Rate 88     Resp 18     Temp 98.8 F (37.1 C)     Temp Source Oral     SpO2 95 %     Weight      Height      Head Circumference      Peak Flow      Pain Score      Pain Loc      Pain Education      Exclude from Growth Chart    Most recent vital signs: Vitals:   05/23/23 1836 05/23/23 2126  BP: (!) 117/93 104/79  Pulse: 88 72  Resp: 18 18  Temp: 98.8 F (37.1 C) 97.9 F (36.6 C)  SpO2: 95% 98%   General: Awake, oriented x4. CV:  Good peripheral perfusion.  Resp:  Normal effort.  Abd:  No distention.  Other:  Middle-aged well-developed, well-nourished Caucasian female resting comfortably in no acute distress ED Results / Procedures / Treatments  Labs (all labs ordered are listed, but only abnormal results are displayed) Labs Reviewed  RESP PANEL BY RT-PCR (RSV, FLU A&B, COVID)  RVPGX2 - Abnormal; Notable for the following components:      Result Value   Influenza A by PCR POSITIVE (*)    All other components within normal  limits  CBC WITH DIFFERENTIAL/PLATELET - Abnormal; Notable for the following components:   RBC 5.35 (*)    Hemoglobin 16.4 (*)    HCT 46.7 (*)    All other components within normal limits  COMPREHENSIVE METABOLIC PANEL - Abnormal; Notable for the following components:   Potassium 3.0 (*)    CO2 19 (*)    Glucose, Bld 113 (*)    All other components within normal limits  URINALYSIS, ROUTINE W REFLEX MICROSCOPIC   PROCEDURES: Critical Care performed: No Procedures MEDICATIONS ORDERED IN ED: Medications  ibuprofen (ADVIL) tablet 600 mg (has no administration in time range)  sodium chloride 0.9 % bolus 1,000 mL (has no administration in time range)  levETIRAcetam (KEPPRA) IVPB 1000 mg/100 mL premix (has no administration in time range)  ondansetron (ZOFRAN) injection 4 mg (4 mg Intravenous Given 05/23/23 2012)   IMPRESSION / MDM / ASSESSMENT AND PLAN / ED COURSE  I reviewed the triage vital signs and the nursing notes.                             The patient is on the cardiac monitor to evaluate for evidence of  arrhythmia and/or significant heart rate changes. Patient's presentation is most consistent with acute presentation with potential threat to life or bodily function. Presentation most consistent with Viral Syndrome.  Patient has tested positive for influenza A. Based on vitals and exam they are nontoxic and stable for discharge.  Given History and Exam I have a lower suspicion for: Emergent CardioPulmonary causes [such as Acute Asthma or COPD Exacerbation, acute Heart Failure or exacerbation, PE, PTX, atypical ACS, PNA]. Emergent Otolaryngeal causes [such as PTA, RPA, Ludwigs, Epiglottitis, EBV].  Regarding Emergent Travel or Immunosuppressive related infectious: I have a low suspicion for acute HIV. Patient has not been treated with any antiepileptics and had no further seizure activity. Will provide strict return precautions and instructions on self-isolation/quarantine and  anticipatory guidance.   FINAL CLINICAL IMPRESSION(S) / ED DIAGNOSES   Final diagnoses:  Seizure-like activity (HCC)  Influenza A virus present  Nausea vomiting and diarrhea  Fever, unspecified fever cause  Generalized body aches   Rx / DC Orders   ED Discharge Orders          Ordered    lamoTRIgine (LAMICTAL) 25 MG tablet  2 times daily        05/23/23 2202    ondansetron (ZOFRAN-ODT) 8 MG disintegrating tablet  Every 8 hours PRN        05/23/23 2202           Note:  This document was prepared using Dragon voice recognition software and may include unintentional dictation errors.   Merwyn Katos, MD 05/23/23 [phone]

## 2023-05-24 ENCOUNTER — Ambulatory Visit: Payer: Self-pay | Admitting: *Deleted

## 2023-05-24 ENCOUNTER — Encounter: Payer: Self-pay | Admitting: Family Medicine

## 2023-05-24 ENCOUNTER — Ambulatory Visit: Admitting: Family Medicine

## 2023-05-24 VITALS — BP 90/60 | HR 75 | Temp 97.5°F | Resp 16 | Ht 67.5 in

## 2023-05-24 DIAGNOSIS — F1193 Opioid use, unspecified with withdrawal: Secondary | ICD-10-CM

## 2023-05-24 DIAGNOSIS — R634 Abnormal weight loss: Secondary | ICD-10-CM

## 2023-05-24 DIAGNOSIS — E876 Hypokalemia: Secondary | ICD-10-CM

## 2023-05-24 DIAGNOSIS — R112 Nausea with vomiting, unspecified: Secondary | ICD-10-CM

## 2023-05-24 DIAGNOSIS — S069X9S Unspecified intracranial injury with loss of consciousness of unspecified duration, sequela: Secondary | ICD-10-CM

## 2023-05-24 DIAGNOSIS — R591 Generalized enlarged lymph nodes: Secondary | ICD-10-CM

## 2023-05-24 DIAGNOSIS — J449 Chronic obstructive pulmonary disease, unspecified: Secondary | ICD-10-CM

## 2023-05-24 DIAGNOSIS — J101 Influenza due to other identified influenza virus with other respiratory manifestations: Secondary | ICD-10-CM

## 2023-05-24 DIAGNOSIS — Z09 Encounter for follow-up examination after completed treatment for conditions other than malignant neoplasm: Secondary | ICD-10-CM

## 2023-05-24 DIAGNOSIS — Z8719 Personal history of other diseases of the digestive system: Secondary | ICD-10-CM

## 2023-05-24 DIAGNOSIS — R531 Weakness: Secondary | ICD-10-CM | POA: Diagnosis not present

## 2023-05-24 DIAGNOSIS — G43719 Chronic migraine without aura, intractable, without status migrainosus: Secondary | ICD-10-CM

## 2023-05-24 DIAGNOSIS — F17210 Nicotine dependence, cigarettes, uncomplicated: Secondary | ICD-10-CM

## 2023-05-24 DIAGNOSIS — R569 Unspecified convulsions: Secondary | ICD-10-CM

## 2023-05-24 DIAGNOSIS — F322 Major depressive disorder, single episode, severe without psychotic features: Secondary | ICD-10-CM

## 2023-05-24 DIAGNOSIS — R1084 Generalized abdominal pain: Secondary | ICD-10-CM

## 2023-05-24 DIAGNOSIS — R0600 Dyspnea, unspecified: Secondary | ICD-10-CM

## 2023-05-24 DIAGNOSIS — K3184 Gastroparesis: Secondary | ICD-10-CM

## 2023-05-24 MED ORDER — NYSTATIN 100000 UNIT/ML MT SUSP: 5.0000 mL | Freq: Four times a day (QID) | OROMUCOSAL | 0 refills | Status: DC

## 2023-05-24 MED ORDER — POTASSIUM CHLORIDE CRYS ER 20 MEQ PO TBCR: 20.0000 meq | EXTENDED_RELEASE_TABLET | Freq: Every day | ORAL | 0 refills | Status: DC

## 2023-05-24 NOTE — Telephone Encounter (Signed)
 Copied from CRM [id number]. Topic: Clinical - Red Word Triage >> May 24, 2023 10:43 AM Franchot Heidelberg wrote: Red Word that prompted transfer to Nurse Triage: Low BP 80/60. Needs hosp fu. Just left the ED last night for flu. Reason for Disposition  [1] Systolic BP 90-110 AND [2] taking blood pressure medications AND [3] dizzy, lightheaded or weak    Has a TBI for the last 6 months she has been dizzy.  BP low  Answer Assessment - Initial Assessment Questions 1. BLOOD PRESSURE: "What is the blood pressure?" "Did you take at least two measurements 5 minutes apart?"     My BP is low 80/60 and for the last 6 months it's been low.   I've been off of BP medications for 6 months and I'm losing weight.    I went to the ED yesterday and I have the flu.   In the ED 86/60 after they gave me IV fluids.    2. ONSET: "When did you take your blood pressure?"     This morning 3. HOW: "How did you obtain the blood pressure?" (e.g., visiting nurse, automatic home BP monitor)     I don't have any energy.   I've been sick since [place]. With the flu.   4. HISTORY: "Do you have a history of low blood pressure?" "What is your blood pressure normally?"     Yes in the past 5. MEDICINES: "Are you taking any medications for blood pressure?" If Yes, ask: "Have they been changed recently?"     No   Not been on BP medicine for several months 6. PULSE RATE: "Do you know what your pulse rate is?"      Not asked 7. OTHER SYMPTOMS: "Have you been sick recently?" "Have you had a recent injury?"     Yes I have the flu now 8. PREGNANCY: "Is there any chance you are pregnant?" "When was your last menstrual period?"     N/A  Protocols used: Blood Pressure - Low-A-AH  Chief Complaint: Low BP, dizziness for the last several months.   Has history of TBI.  But dizziness seems to be all the time now.   Went to ED last night and diagnosed with flu. Symptoms: dizziness 24/7 now, weak, losing weight, BP running consistently low. Frequency:  daily for the last several months Pertinent Negatives: Patient denies N/A Disposition: [] ED /[] Urgent Care (no appt availability in office) / [x] Appointment(In office/virtual)/ []  [place] [place] [facility]/ [] Home Care/ [] Refused Recommended Disposition /[] [facility] [facility]/ []  Follow-up with PCP Additional Notes: Appt made for today with Danelle Berry. PA-C

## 2023-05-24 NOTE — Patient Instructions (Addendum)
 Rest, push fluids with salt, take the potassium once daily for the next 5 days Come back next Monday for [place] to recheck your labs and BP  Call Dr. Malvin Johns to make sure you can get a follow up right away  Team Member Role and Specialty Contact Info Address  Morene Crocker, MD Referring Physician (Neurology) Phone: [phone] Fax: [phone] [address] [facility] [place] [address]   I have ordered head and chest CT  You can look up MAP - mean arterial pressure - A MAP of 60 mmHg or greater is believed to be needed to maintain adequate tissue perfusion. Google MDCALC MAP for a calculator to double check low Bps at home to ensure they are above 60 If lower or if you pass out or have another seizure call 911

## 2023-05-24 NOTE — Progress Notes (Signed)
 Patient ID: [name], female    DOB: 01-20-1966, 58 y.o.   MRN: [id number]  PCP: Danelle Berry, PA-C  Chief Complaint  Patient presents with   Influenza   Fatigue    Subjective:   [name] is a 58 y.o. female, presents to clinic with CC of the following:  HPI   Seen in ED last night dx with flu, BP was low and she had multiple seizures in UC and while in ED She was loaded with lamictal and her dose was increased from 100 mg BID to 125 mg BID No further seizure activity last night She reports taking lamictal ED labs reviewed - potassium low, H/H high, good renal function She was given zofran and nystatin (for dry mouth?)   low BP and weakness even after IVF - she is not on her BP med and has not been on it since Sept 2024 (the last I did with her we discussed breaking it in half and monitoring for soft BP) 70 mm Hg Mean Arterial Pressure (MAP) BP Readings from Last 3 Encounters:  05/24/23 90/60  05/24/23 101/69  01/12/23 120/72  Too weak to get up on the scale today, no syncope   Weighs 112 if that - gradual and continued weight loss despite efforts to eat, she is seeing GI, her pain meds were changed around She is switching to [facility] since she was not getting any better As of our last OV she reported only being able to eat a bite of an apple at a time and that's it. Wt Readings from Last 5 Encounters:  01/12/23 119 lb 12.8 oz (54.3 kg)  01/05/23 117 lb (53.1 kg)  11/08/22 120 lb 9.6 oz (54.7 kg)  07/13/22 132 lb (59.9 kg)  06/23/22 136 lb 9.6 oz (62 kg)   BMI Readings from Last 5 Encounters:  05/24/23 18.49 kg/m  01/12/23 18.22 kg/m  01/05/23 17.79 kg/m  11/08/22 18.34 kg/m  07/13/22 20.07 kg/m    Constipation abd pain, she is on highest dose linzess and reports loose to watery stools daily Its been some time since having firm compact stools and constipation.  Linzess 290 mg dose since Last October per GI   Flulike sx started last  Thursday 5 d ago No fever but higher than normal temp for her   [name] - 04/18/2023 9:00 AM EST  Formatting of this note might be different from the original. - Will start approval process for Botox injections for headaches. - Increase Lamictal to 100 mg twice a day. - Referral to [facility] for GI for weight loss and abdominal pain. - Reviewed 3 hour EEG from 03/25/2023, within normal limits. - Could consider starting Depakote for headaches, mood, seizures, and weight at future visit. - Could consider starting Effexor and Nortriptyline, patient deferred at this time.   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/08/2019  Intractable 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 without status migrainosus 04/06/2019   Hypertension    Chronic neck pain    FH: colon polyps 11/15/2016   Other dysphagia 11/15/2016   Other constipation 11/15/2016   PTSD (post-traumatic stress disorder) 05/18/2016   Breast mass, left 01/08/2016   Chronic pain 01/08/2016   GERD (gastroesophageal reflux disease) 01/08/2016   Difficulty sleeping 08/05/2015      Current Outpatient Medications:    albuterol (VENTOLIN HFA) 108 (90 Base) MCG/ACT inhaler, INHALE 2 PUFFS BY MOUTH EVERY 4 TO 6 HOURS AS NEEDED, Disp: 8.5 each, Rfl: 0   carisoprodol (SOMA) 350 MG tablet, Take 700 mg by mouth 2 (two) times daily., Disp: , Rfl:    desvenlafaxine (PRISTIQ) 100 MG 24 hr tablet,  TAKE 1 TABLET BY MOUTH EVERY DAY, Disp: 90 tablet, Rfl: 0   lamoTRIgine (LAMICTAL) 100 MG tablet, Take 1 tablet by mouth 2 (two) times daily., Disp: , Rfl:    lamoTRIgine (LAMICTAL) 25 MG tablet, Take 1 tablet (25 mg total) by mouth 2 (two) times daily for 5 days. Please take in addition to the 100mg  lamictal dose for a total dose of 125mg  twice a day, Disp: 10 tablet, Rfl: 0   linaclotide (LINZESS) 290 MCG CAPS capsule, TAKE 1 CAPSULE BY MOUTH DAILY., Disp: 30 capsule, Rfl: 11   pantoprazole (PROTONIX) 40 MG tablet, TAKE 1 TABLET BY MOUTH TWICE A DAY, Disp: 180 tablet, Rfl: 3   buprenorphine (SUBUTEX) 8 MG SUBL SL tablet, Place 8 mg under the tongue 2 times daily at 12 noon and 4 pm., Disp: , Rfl:    lactulose (CHRONULAC) 10 GM/15ML solution, Take 45 mLs (30 g total) by mouth 4 (four) times daily as needed for mild constipation., Disp: 946 mL, Rfl: 11   lisinopril-hydrochlorothiazide (ZESTORETIC) 20-12.5 MG tablet, TAKE 1 TABLET BY MOUTH EVERY DAY (Patient not taking: Reported on 05/24/2023), Disp: 90 tablet, Rfl: 1   metoCLOPramide (REGLAN) 10 MG tablet, Take 1 tablet (10 mg total) by mouth 3 (three) times daily as needed for nausea (headache / nausea). (Patient not taking: Reported on 05/24/2023), Disp: 90 tablet, Rfl: 0   nystatin (MYCOSTATIN) 100000 UNIT/ML suspension, Take 5 mLs (500,000 Units total) by mouth 4 (four) times daily. (Patient not taking: Reported on 05/24/2023), Disp: 60 mL, Rfl: 0   ondansetron (ZOFRAN-ODT) 8 MG disintegrating tablet, Take 1 tablet (8 mg total) by mouth every 8 (eight) hours as needed for nausea or vomiting. (Patient not taking: Reported on 05/24/2023), Disp: 20 tablet, Rfl: 0   sucralfate (CARAFATE) 1 g tablet, Take 1 tablet (1 g total) by mouth 4 (four) times daily., Disp: 120 tablet, Rfl: 11   Allergies  Allergen Reactions   Contrast Media [Iodinated Contrast Media]    Diphenhydramine Hcl Hives and Itching   Adhesive [Tape]     PAPER TAPE OK   Iodine Other  (See Comments)   Prednisone Anxiety   Sulfa Antibiotics Rash     Social History   Tobacco Use   Smoking status: Every Day    Current packs/day: 1.00    Types: Cigarettes   Smokeless tobacco: Never  Vaping Use   Vaping status: Every Day   Substances: CBD  Substance Use Topics   Alcohol use: No   Drug use: Yes    Types: Marijuana    Comment: 06/22/2022      Chart Review Today: I personally reviewed active problem list, medication list, allergies, family history, social history, health maintenance, notes from  last encounter, lab results, imaging with the patient/caregiver today.   Review of Systems  Constitutional:  Positive for activity change, appetite change, fatigue and fever.  HENT: Negative.    Eyes: Negative.   Respiratory: Negative.    Cardiovascular: Negative.   Gastrointestinal: Negative.   Endocrine: Negative.   Genitourinary: Negative.   Musculoskeletal: Negative.   Skin: Negative.   Allergic/Immunologic: Negative.   Neurological:  Positive for seizures, speech difficulty (since seizures) and weakness.  Hematological: Negative.   Psychiatric/Behavioral: Negative.    All other systems reviewed and are negative.      Objective:   Vitals:   05/24/23 1555  BP: 90/60  Pulse: 75  Resp: 16  Temp: (!) 97.5 F (36.4 C)  TempSrc: Oral  SpO2: 100%  Height: 5' 7.5" (1.715 m)    Body mass index is 18.49 kg/m.  Physical Exam Vitals and nursing note reviewed.  Constitutional:      Appearance: She is well-developed and underweight. She is not ill-appearing, toxic-appearing or diaphoretic.  HENT:     Head: Normocephalic and atraumatic.     Nose: Nose normal.  Eyes:     General:        Right eye: No discharge.        Left eye: No discharge.     Conjunctiva/sclera: Conjunctivae normal.  Neck:     Trachea: No tracheal deviation.  Cardiovascular:     Rate and Rhythm: Normal rate and regular rhythm.  Pulmonary:     Effort: Pulmonary effort is normal.  No tachypnea, accessory muscle usage or respiratory distress.     Breath sounds: No stridor. No wheezing, rhonchi or rales.  Musculoskeletal:        General: Normal range of motion.  Skin:    General: Skin is warm and dry.     Findings: No rash.  Neurological:     Mental Status: She is alert.     Motor: No abnormal muscle tone.     Coordination: Coordination normal.  Psychiatric:        Attention and Perception: Attention normal.        Mood and Affect: Mood normal. Mood is not anxious or depressed. Affect is tearful.        Speech: Speech is slurred.        Behavior: Behavior normal. Behavior is cooperative.      Results for orders placed or performed during the hospital encounter of 05/23/23  CBC with Differential   Collection Time: 05/23/23  6:35 PM  Result Value Ref Range   WBC 6.6 4.0 - 10.5 K/uL   RBC 5.35 (H) 3.87 - 5.11 MIL/uL   Hemoglobin 16.4 (H) 12.0 - 15.0 g/dL   HCT 16.1 (H) 09.6 - 04.5 %   MCV 87.3 80.0 - 100.0 fL   MCH 30.7 26.0 - 34.0 pg   MCHC 35.1 30.0 - 36.0 g/dL   RDW 40.9 81.1 - 91.4 %   Platelets 295 150 - 400 K/uL   nRBC 0.0 0.0 - 0.2 %   Neutrophils Relative % 41 %   Neutro Abs 2.7 1.7 - 7.7 K/uL   Lymphocytes Relative 49 %   Lymphs Abs 3.1 0.7 - 4.0 K/uL   Monocytes Relative 10 %   Monocytes Absolute 0.7 0.1 - 1.0 K/uL   Eosinophils Relative 0 %   Eosinophils Absolute 0.0 0.0 - 0.5 K/uL   Basophils Relative 0 %   Basophils Absolute 0.0 0.0 - 0.1 K/uL   Immature Granulocytes  0 %   Abs Immature Granulocytes 0.01 0.00 - 0.07 K/uL  Comprehensive metabolic panel   Collection Time: 05/23/23  6:35 PM  Result Value Ref Range   Sodium 135 135 - 145 mmol/L   Potassium 3.0 (L) 3.5 - 5.1 mmol/L   Chloride 103 98 - 111 mmol/L   CO2 19 (L) 22 - 32 mmol/L   Glucose, Bld 113 (H) 70 - 99 mg/dL   BUN 12 6 - 20 mg/dL   Creatinine, Ser 1.91 0.44 - 1.00 mg/dL   Calcium 9.8 8.9 - 47.8 mg/dL   Total Protein 7.9 6.5 - 8.1 g/dL   Albumin 4.4 3.5 - 5.0 g/dL    AST 26 15 - 41 U/L   ALT 19 0 - 44 U/L   Alkaline Phosphatase 97 38 - 126 U/L   Total Bilirubin 0.3 0.0 - 1.2 mg/dL   GFR, Estimated >29 >56 mL/min   Anion gap 13 5 - 15  Resp panel by RT-PCR (RSV, Flu A&B, Covid) Anterior Nasal Swab   Collection Time: 05/23/23  6:38 PM   Specimen: Anterior Nasal Swab  Result Value Ref Range   SARS Coronavirus 2 by RT PCR NEGATIVE NEGATIVE   Influenza A by PCR POSITIVE (A) NEGATIVE   Influenza B by PCR NEGATIVE NEGATIVE   Resp Syncytial Virus by PCR NEGATIVE NEGATIVE       Assessment & Plan:   1. Nausea, vomiting and diarrhea (Primary) Suggest holding linzess if not eating much and having watery BM daily - CT HEAD WO CONTRAST ( )  2. Influenza A Continue supportive measures, rest, hydrate - out of window for tamiflu (sx onset 5 d ago) Lungs CTA, no tachycardia, SOB or CP  3. Weakness Gradual and progressive with associated weight loss  4. Hypokalemia K 3.0, will replace and recheck next week - potassium chloride SA (KLOR-CON M) 20 MEQ tablet; Take 1 tablet (20 mEq total) by mouth daily.  Dispense: 5 tablet; Refill: 0  5. Seizure-like activity (HCC) Managed by neurology - pt instructed to call [facility] to see if she can get sooner appt Acute illness like flu or other viral illnesses can lower seizure threshold  She is compliant with lamictal - CT HEAD WO CONTRAST ( )  6. Intractable 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 without status migrainosus Plan with Dr. Malvin Johns for management She is off pain meds, still taking soma Pain is worse, more pressure, she is concerned she may have another bleed - CT HEAD WO CONTRAST ( )   7. Unintentional weight loss of less than 10% body weight within 6 months Wt Readings from Last 5 Encounters:  01/12/23 119 lb 12.8 oz (54.3 kg)  01/05/23 117 lb (53.1 kg)  11/08/22 120 lb 9.6 oz (54.7 kg)  07/13/22 132 lb (59.9 kg)  06/23/22 136 lb 9.6 oz (62 kg)   BMI Readings from Last 5 Encounters:   05/24/23 18.49 kg/m  01/12/23 18.22 kg/m  01/05/23 17.79 kg/m  11/08/22 18.34 kg/m  07/13/22 20.07 kg/m  Prior discussion about adding high calorie foods/boost or ensure Pt reports continuing to loose weight With poor PO intake I'm not surprised, however I do not know what the solution would be She is no longer having N/V or daily abd pain or severe constipation and with GI's consultation and help I would have expected some improvement on appetite/po intake Discussed more invasive help for this including NG tube etc - pt became tearful.  She reports she has been trying to eat  more and just can't - CT Chest Wo Contrast; Future  8. Generalized abdominal pain Somewhat improved On PPI and linzess Not using antiemetics   9. History of small bowel obstruction  10. Gastroparesis She has not been trying reglan regularly and she stopped pain management meds on her own  37. Smoking greater than 20 pack years - CT Chest Wo Contrast; Future  12. Chronic obstructive pulmonary disease, unspecified COPD type (HCC) Pt has for years declined referral to pulm, dx, smoking cessation or lung cancer screening, she does no endorse some weakness and SOB, last OV we did CXR which was normal  13. Traumatic brain injury with loss of consciousness, sequela (HCC) Managed by neuro for post TBI with HA's seizures  14. Severe major depressive disorder (HCC) Mood worse with acute and chronic health issues On pristique  15. Dyspnea, unspecified type See #12 - CT Chest Wo Contrast; Future  16. Lymphadenopathy Pt mentions today some bumps and lymphnodes in chest, armpit neck that she can feel since she has been losing weight - CT Chest Wo Contrast; Future  close f/up we will try to get her back in clinic [facility] 3/31 at 3:40 for further f/up  17. Opioid withdrawal (HCC) Pt suddenly stopped taking her subutex 7 d ago since it "didn't do anything for her"  Some of her more acute sx - flulike illness,  GI upset, etc - may be due to some withdrawal?  PHQ 9 score very high, pt currently on meds, she has refused psych many times before     01/12/2023    1:14 PM 11/08/2022   10:34 AM 05/21/2022   10:27 AM  Depression screen PHQ 2/9  Decreased Interest 3 2 3   Down, Depressed, Hopeless 3 1 3   PHQ - 2 Score 6 3 6   Altered sleeping 3 3 2   Tired, decreased energy 3 3 2   Change in appetite 3 3 3   Feeling bad or failure about yourself  2 1 1   Trouble concentrating 2 1 3   Moving slowly or fidgety/restless 3 0 0  Suicidal thoughts 0 0 0  PHQ-9 Score 22 14 17   Difficult doing work/chores Very difficult Very difficult Very difficult    18. Encounter for examination following treatment at hospital Reviewed ED encounter, and all results    Danelle Berry, PA-C 05/24/23 4:02 PM

## 2023-05-26 ENCOUNTER — Other Ambulatory Visit: Payer: Self-pay | Admitting: Family Medicine

## 2023-05-26 DIAGNOSIS — I10 Essential (primary) hypertension: Secondary | ICD-10-CM

## 2023-05-27 ENCOUNTER — Ambulatory Visit
Admission: RE | Admit: 2023-05-27 | Discharge: 2023-05-27 | Disposition: A | Discharge status: Home / Self Care | Source: Ambulatory Visit | Attending: Family Medicine | Admitting: Family Medicine

## 2023-05-27 ENCOUNTER — Ambulatory Visit

## 2023-05-27 DIAGNOSIS — F17210 Nicotine dependence, cigarettes, uncomplicated: Secondary | ICD-10-CM | POA: Diagnosis present

## 2023-05-27 DIAGNOSIS — R591 Generalized enlarged lymph nodes: Secondary | ICD-10-CM

## 2023-05-27 DIAGNOSIS — R634 Abnormal weight loss: Secondary | ICD-10-CM | POA: Diagnosis present

## 2023-05-27 DIAGNOSIS — R112 Nausea with vomiting, unspecified: Secondary | ICD-10-CM | POA: Insufficient documentation

## 2023-05-27 DIAGNOSIS — G43719 Chronic migraine without aura, intractable, without status migrainosus: Secondary | ICD-10-CM | POA: Insufficient documentation

## 2023-05-27 DIAGNOSIS — R197 Diarrhea, unspecified: Secondary | ICD-10-CM | POA: Insufficient documentation

## 2023-05-27 DIAGNOSIS — R569 Unspecified convulsions: Secondary | ICD-10-CM | POA: Diagnosis present

## 2023-05-27 DIAGNOSIS — R0600 Dyspnea, unspecified: Secondary | ICD-10-CM | POA: Diagnosis present

## 2023-05-31 ENCOUNTER — Encounter: Payer: Self-pay | Admitting: Family Medicine

## 2023-05-31 ENCOUNTER — Ambulatory Visit (INDEPENDENT_AMBULATORY_CARE_PROVIDER_SITE_OTHER): Admitting: Family Medicine

## 2023-05-31 VITALS — BP 128/68 | HR 61 | Resp 16 | Ht 67.0 in | Wt 102.0 lb

## 2023-05-31 DIAGNOSIS — R1084 Generalized abdominal pain: Secondary | ICD-10-CM | POA: Diagnosis not present

## 2023-05-31 DIAGNOSIS — G43719 Chronic migraine without aura, intractable, without status migrainosus: Secondary | ICD-10-CM

## 2023-05-31 DIAGNOSIS — E46 Unspecified protein-calorie malnutrition: Secondary | ICD-10-CM | POA: Diagnosis not present

## 2023-05-31 DIAGNOSIS — R634 Abnormal weight loss: Secondary | ICD-10-CM

## 2023-05-31 DIAGNOSIS — K5909 Other constipation: Secondary | ICD-10-CM | POA: Diagnosis not present

## 2023-05-31 MED ORDER — LINACLOTIDE 145 MCG PO CAPS: 145.0000 ug | ORAL_CAPSULE | Freq: Every day | ORAL | 0 refills | Status: DC

## 2023-05-31 MED ORDER — PROCHLORPERAZINE MALEATE 10 MG PO TABS: 10.0000 mg | ORAL_TABLET | Freq: Two times a day (BID) | ORAL | 0 refills | Status: DC | PRN

## 2023-05-31 NOTE — Progress Notes (Signed)
 Patient ID: [name], female    DOB: 03-06-1965, 58 y.o.   MRN: [id number]  PCP: Adeline Hone, PA-C  Chief Complaint  Patient presents with   Fatigue   Consult    Subjective:   [name] is a 58 y.o. female, presents to clinic with CC of the following:  HPI  Here f/up on weight loss and generalized weakness/fatigue, decreased appetite Head and chest CT done today and reviewed Pt had influenza last week and seizures- med doses increased and she's following with neurology Low bp despite ED visit and IVF Advised to continue to hold/d/c BP meds and try holding linzess since no PO intake and daily high dose linzess was causing daily fluid loss via bowels  She had also stopped pain meds on her own x 7 d as of last OV  Weight loss had been gradual and progressive with associated abd pain, n/v with eating  She has seen GI specialists for years about this and her pain management specialists - changing GI since she didn't have much improvement Still not eating much, though she is trying and able to eat a little more than OV last year when she said she could only bite an apple Still eating things like celery  No calorie increase efforts, no supplemental boosts She did stop the linzess high dose, feels better than last week Wt Readings from Last 5 Encounters:  01/12/23 119 lb 12.8 oz (54.3 kg)  01/05/23 117 lb (53.1 kg)  11/08/22 120 lb 9.6 oz (54.7 kg)  07/13/22 132 lb (59.9 kg)  06/23/22 136 lb 9.6 oz (62 kg)   BMI Readings from Last 5 Encounters:  05/24/23 18.49 kg/m  01/12/23 18.22 kg/m  01/05/23 17.79 kg/m  11/08/22 18.34 kg/m  07/13/22 20.07 kg/m      Patient Active Problem List   Diagnosis Date Noted   Unintentional weight loss of less than 10% body weight within 6 months 11/08/2022   Melena 06/23/2022   Mixed hyperlipidemia 12/09/2021   Polyp of transverse colon    Gastric outlet obstruction 03/20/2020   Chronic obstructive pulmonary disease (HCC)  06/14/2019   Tobacco use 06/14/2019   Traumatic brain injury with loss of consciousness (HCC) 05/08/2019   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/08/2019   Intractable 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 without status migrainosus 04/06/2019   Hypertension    Chronic neck pain    FH: colon polyps 11/15/2016   Other dysphagia 11/15/2016   Other constipation 11/15/2016   PTSD (post-traumatic stress disorder) 05/18/2016   Breast mass, left 01/08/2016   Severe major depressive disorder (HCC) 01/08/2016   Chronic pain 01/08/2016   GERD (gastroesophageal reflux disease) 01/08/2016   Difficulty sleeping 08/05/2015      Current Outpatient Medications:    albuterol (VENTOLIN HFA) 108 (90 Base) MCG/ACT inhaler, INHALE 2 PUFFS BY MOUTH EVERY 4 TO 6 HOURS AS NEEDED, Disp: 8.5 each, Rfl: 0   carisoprodol (SOMA) 350 MG tablet, Take 700 mg by mouth 2 (two) times daily., Disp: , Rfl:    desvenlafaxine (PRISTIQ) 100 MG 24 hr tablet, TAKE 1 TABLET BY MOUTH EVERY DAY, Disp: 90 tablet, Rfl: 0   lamoTRIgine (LAMICTAL) 100 MG tablet, Take 1 tablet by mouth 2 (two) times daily., Disp: , Rfl:    lamoTRIgine (LAMICTAL) 25 MG tablet, Take 1 tablet (25 mg total) by mouth 2 (two) times daily for 5 days. Please take in addition to the 100mg  lamictal  dose for a total dose of 125mg  twice a day, Disp: 10 tablet, Rfl: 0   linaclotide (LINZESS) 290 MCG CAPS capsule, TAKE 1 CAPSULE BY MOUTH DAILY., Disp: 30 capsule, Rfl: 11   nystatin (MYCOSTATIN) 100000 UNIT/ML suspension, Take 5 mLs (500,000 Units total) by mouth 4 (four) times daily. (Patient not taking: Reported on 05/24/2023), Disp: 60 mL, Rfl: 0   ondansetron (ZOFRAN-ODT) 8 MG disintegrating tablet, Take 1 tablet (8 mg total) by mouth every 8 (eight) hours as needed for nausea or vomiting. (Patient not taking: Reported on 05/24/2023), Disp: 20 tablet, Rfl: 0   pantoprazole (PROTONIX) 40 MG tablet, TAKE 1 TABLET BY MOUTH TWICE A DAY, Disp: 180  tablet, Rfl: 3   potassium chloride SA (KLOR-CON M) 20 MEQ tablet, Take 1 tablet (20 mEq total) by mouth daily., Disp: 5 tablet, Rfl: 0   Allergies  Allergen Reactions   Contrast Media [Iodinated Contrast Media]    Diphenhydramine Hcl Hives and Itching   Adhesive [Tape]     PAPER TAPE OK   Iodine Other (See Comments)   Prednisone Anxiety   Sulfa Antibiotics Rash     Social History   Tobacco Use   Smoking status: Every Day    Current packs/day: 1.00    Types: Cigarettes   Smokeless tobacco: Never  Vaping Use   Vaping status: Every Day   Substances: CBD  Substance Use Topics   Alcohol use: No   Drug use: Yes    Types: Marijuana    Comment: 06/22/2022      Chart Review Today: I personally reviewed active problem list, medication list, allergies, family history, social history, health maintenance, notes from last encounter, lab results, imaging with the patient/caregiver today.   Review of Systems  Constitutional: Negative.   HENT: Negative.    Eyes: Negative.   Respiratory: Negative.    Cardiovascular: Negative.   Gastrointestinal: Negative.   Endocrine: Negative.   Genitourinary: Negative.   Musculoskeletal: Negative.   Skin: Negative.   Allergic/Immunologic: Negative.   Neurological: Negative.   Hematological: Negative.   Psychiatric/Behavioral: Negative.    All other systems reviewed and are negative.      Objective:   There were no vitals filed for this visit.  There is no height or weight on file to calculate BMI.  Physical Exam Vitals and nursing note reviewed.  Constitutional:      Appearance: She is well-developed. She is cachectic. She is not ill-appearing, toxic-appearing or diaphoretic.  HENT:     Head: Normocephalic and atraumatic.     Nose: Nose normal.  Eyes:     General:        Right eye: No discharge.        Left eye: No discharge.     Conjunctiva/sclera: Conjunctivae normal.  Neck:     Trachea: No tracheal deviation.   Cardiovascular:     Rate and Rhythm: Normal rate and regular rhythm.  Pulmonary:     Effort: Pulmonary effort is normal. No respiratory distress.     Breath sounds: No stridor.  Musculoskeletal:        General: Normal range of motion.  Skin:    General: Skin is warm and dry.     Findings: No rash.  Neurological:     Mental Status: She is alert.     Motor: No abnormal muscle tone.     Coordination: Coordination normal.  Psychiatric:        Behavior: Behavior normal. Behavior is cooperative.  Results for orders placed or performed during the hospital encounter of 05/23/23  CBC with Differential   Collection Time: 05/23/23  6:35 PM  Result Value Ref Range   WBC 6.6 4.0 - 10.5 K/uL   RBC 5.35 (H) 3.87 - 5.11 MIL/uL   Hemoglobin 16.4 (H) 12.0 - 15.0 g/dL   HCT 16.1 (H) 09.6 - 04.5 %   MCV 87.3 80.0 - 100.0 fL   MCH 30.7 26.0 - 34.0 pg   MCHC 35.1 30.0 - 36.0 g/dL   RDW 40.9 81.1 - 91.4 %   Platelets 295 150 - 400 K/uL   nRBC 0.0 0.0 - 0.2 %   Neutrophils Relative % 41 %   Neutro Abs 2.7 1.7 - 7.7 K/uL   Lymphocytes Relative 49 %   Lymphs Abs 3.1 0.7 - 4.0 K/uL   Monocytes Relative 10 %   Monocytes Absolute 0.7 0.1 - 1.0 K/uL   Eosinophils Relative 0 %   Eosinophils Absolute 0.0 0.0 - 0.5 K/uL   Basophils Relative 0 %   Basophils Absolute 0.0 0.0 - 0.1 K/uL   Immature Granulocytes 0 %   Abs Immature Granulocytes 0.01 0.00 - 0.07 K/uL  Comprehensive metabolic panel   Collection Time: 05/23/23  6:35 PM  Result Value Ref Range   Sodium 135 135 - 145 mmol/L   Potassium 3.0 (L) 3.5 - 5.1 mmol/L   Chloride 103 98 - 111 mmol/L   CO2 19 (L) 22 - 32 mmol/L   Glucose, Bld 113 (H) 70 - 99 mg/dL   BUN 12 6 - 20 mg/dL   Creatinine, Ser 7.82 0.44 - 1.00 mg/dL   Calcium 9.8 8.9 - 95.6 mg/dL   Total Protein 7.9 6.5 - 8.1 g/dL   Albumin 4.4 3.5 - 5.0 g/dL   AST 26 15 - 41 U/L   ALT 19 0 - 44 U/L   Alkaline Phosphatase 97 38 - 126 U/L   Total Bilirubin 0.3 0.0 - 1.2 mg/dL    GFR, Estimated >21 >30 mL/min   Anion gap 13 5 - 15  Resp panel by RT-PCR (RSV, Flu A&B, Covid) Anterior Nasal Swab   Collection Time: 05/23/23  6:38 PM   Specimen: Anterior Nasal Swab  Result Value Ref Range   SARS Coronavirus 2 by RT PCR NEGATIVE NEGATIVE   Influenza A by PCR POSITIVE (A) NEGATIVE   Influenza B by PCR NEGATIVE NEGATIVE   Resp Syncytial Virus by PCR NEGATIVE NEGATIVE       Assessment & Plan:   1. Other constipation (Primary) Decrease dose or try d/c since not eating much, and off narcotics - linaclotide (LINZESS) 145 MCG CAPS capsule; Take 1 capsule (145 mcg total) by mouth daily before breakfast.  Dispense: 30 capsule; Refill: 0  2. Intractable 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 without status migrainosus Per specialist/neuro and pain management  3. Generalized abdominal pain May be due to chronic marijuana use/cannabinoid hyperemesis syndrome?  But she does have other significant GI history including obstruction and surgeries Encouraged her to decrease marijuana use possibly try CBD products in place of marijuana THC products, patient given handouts on the syndrome and encouraged to decrease use which would lead to decrease symptoms, she states she is smoking marijuana because a silly thing that is helping her with her various places of chronic pain She is still going to follow with the new GI specialist at [facility]  4. Malnutrition, unspecified type (HCC) Reviewed calorie needs and high-calorie foods, referral to registered  dietitian, explained to her if she continues to lose weight her health will significantly suffer and she may need help from specialists like palliative care or may need other more invasive supplemental nutrition to help her regain weight and strength - Amb ref to Medical Nutrition Therapy-MNT  5. Unintentional weight loss of less than 10% body weight within 6 months See above - Amb ref to Medical Nutrition Therapy-MNT  Overall pt looking  much better than last week with flu, and CT scans were reassuring and reviewed with pt at length today  Return in about 1 month (around 06/30/2023) for weight check.    Adeline Hone, PA-C 05/31/23 12:26 PM

## 2023-05-31 NOTE — Patient Instructions (Signed)
 We will try to coordinate with GI and neurology for approaches to help appetite and weight gain.

## 2023-06-07 ENCOUNTER — Ambulatory Visit

## 2023-06-07 DIAGNOSIS — Z599 Problem related to housing and economic circumstances, unspecified: Secondary | ICD-10-CM

## 2023-06-07 DIAGNOSIS — Z0000 Encounter for general adult medical examination without abnormal findings: Secondary | ICD-10-CM | POA: Diagnosis not present

## 2023-06-07 NOTE — Patient Instructions (Addendum)
 Ms. Zenker , Thank you for taking time to come for your Medicare Wellness Visit. I appreciate your ongoing commitment to your health goals. Please review the following plan we discussed and let me know if I can assist you in the future.   Referrals/Orders/Follow-Ups/Clinician Recommendations: SENT REFERRAL FOR FOOD/FINANCIAL DIFFICULTIES  This is a list of the screening recommended for you and due dates:  Health Maintenance  Topic Date Due   Pneumococcal Vaccination (1 of 2 - PCV) Never done   Mammogram  05/11/2024   Screening for Lung Cancer  05/26/2024   Medicare Annual Wellness Visit  06/06/2024   Pap with HPV screening  09/08/2025   Colon Cancer Screening  06/14/2027   DTaP/Tdap/Td vaccine (2 - Td or Tdap) 09/06/2027   Hepatitis C Screening  Completed   HIV Screening  Completed   HPV Vaccine  Aged Out   Flu Shot  Discontinued   COVID-19 Vaccine  Discontinued   Zoster (Shingles) Vaccine  Discontinued    Advanced directives: (ACP Link)Information on Advanced Care Planning can be found at [facility] of [place] Advance Health Care Directives Advance Health Care Directives. [email or web address]   Next Medicare Annual Wellness Visit scheduled for next year: Yes   06/14/24 @ 9:30 AM BY VIDEO

## 2023-06-07 NOTE — Progress Notes (Signed)
 Subjective:   [name] is a 58 y.o. who presents for a Medicare Wellness preventive visit.  Visit Complete: Virtual I connected with  [name] on 06/07/23 by a video and audio enabled telemedicine application and verified that I am speaking with the correct person using two identifiers.  Patient Location: Home  Provider Location: Office/Clinic  I discussed the limitations of evaluation and management by telemedicine. The patient expressed understanding and agreed to proceed.  Vital Signs: Because this visit was a virtual/telehealth visit, some criteria may be missing or patient reported. Any vitals not documented were not able to be obtained and vitals that have been documented are patient reported.   Persons Participating in Visit: Patient.  AWV Questionnaire: No: Patient Medicare AWV questionnaire was not completed prior to this visit.  Cardiac Risk Factors include: advanced age (>62men, >23 women);dyslipidemia;hypertension;smoking/ tobacco exposure     Objective:    Today's Vitals   06/07/23 0932  PainSc: 4    There is no height or weight on file to calculate BMI.     06/07/2023    9:40 AM 05/23/2023    6:35 PM 01/07/2023    5:58 AM 06/23/2022   10:24 AM 05/21/2022   10:33 AM 06/13/2020    8:09 AM 05/01/2020    2:22 PM  Advanced Directives  Does Patient Have a Medical Advance Directive? No No No No Yes No No  Type of Advance Directive     Healthcare Power of Attorney    Would patient like information on creating a medical advance directive? No - Patient declined  No - Patient declined No - Patient declined   No - Patient declined    Current Medications (verified) Outpatient Encounter Medications as of 06/07/2023  Medication Sig   albuterol (VENTOLIN HFA) 108 (90 Base) MCG/ACT inhaler INHALE 2 PUFFS BY MOUTH EVERY 4 TO 6 HOURS AS NEEDED   carisoprodol (SOMA) 350 MG tablet Take 700 mg by mouth 2 (two) times daily.   desvenlafaxine (PRISTIQ) 100 MG 24 hr tablet TAKE 1  TABLET BY MOUTH EVERY DAY   lamoTRIgine (LAMICTAL) 100 MG tablet Take 1 tablet by mouth 2 (two) times daily.   linaclotide (LINZESS) 145 MCG CAPS capsule Take 1 capsule (145 mcg total) by mouth daily before breakfast.   nystatin (MYCOSTATIN) 100000 UNIT/ML suspension Take 5 mLs (500,000 Units total) by mouth 4 (four) times daily.   ondansetron (ZOFRAN-ODT) 8 MG disintegrating tablet Take 1 tablet (8 mg total) by mouth every 8 (eight) hours as needed for nausea or vomiting.   pantoprazole (PROTONIX) 40 MG tablet TAKE 1 TABLET BY MOUTH TWICE A DAY   prochlorperazine (COMPAZINE) 10 MG tablet Take 1 tablet (10 mg total) by mouth 2 (two) times daily as needed for nausea or vomiting (Nausea ).   lamoTRIgine (LAMICTAL) 25 MG tablet Take 1 tablet (25 mg total) by mouth 2 (two) times daily for 5 days. Please take in addition to the 100mg  lamictal dose for a total dose of 125mg  twice a day   linaclotide (LINZESS) 290 MCG CAPS capsule TAKE 1 CAPSULE BY MOUTH DAILY. (Patient not taking: Reported on 06/07/2023)   No facility-administered encounter medications on file as of 06/07/2023.    Allergies (verified) Contrast media [iodinated contrast media], Diphenhydramine hcl, Adhesive [tape], Iodine, Prednisone, and Sulfa antibiotics   History: Past Medical History:  Diagnosis Date   BRBPR (bright red blood per rectum)    Breast mass, left    Cancer (HCC)  CERVICAL   Crushing injury of neck 09/27/2011   Depression    Falls    EASILY   GERD (gastroesophageal reflux disease)    Head injury 05/06/2011   History of subarachnoid hemorrhage 05/07/2019   Formatting of this note might be different from the original. December 2020   PTSD (post-traumatic stress disorder)    PTSD (post-traumatic stress disorder)    S/P laparoscopic appendectomy 02/15/2019   01/24/2019, for distal SBO, and nl appendix.    SAH (subarachnoid hemorrhage) (HCC) 04/06/2019   SBO (small bowel obstruction) (HCC) 05/01/2020   Seizures (HCC)     Shoulder disorder    LIMITED USE OF LEFT SHOULDER AND ARM   TBI (traumatic brain injury) (HCC)    SKULL FX 2009   Vertigo    Vertigo    Past Surgical History:  Procedure Laterality Date   abd pain     APPENDECTOMY     BACK SURGERY     LUMBAR FUSIONS X 2   CATARACT EXTRACTION W/PHACO Right 01/07/2015   Procedure: CATARACT EXTRACTION PHACO AND INTRAOCULAR LENS PLACEMENT (IOC);  Surgeon: Galen Manila, MD;  Location: [facility] ORS;  Service: Ophthalmology;  Laterality: Right;  [place] 0 AP% 0 CDE 0 fluid pack lot #[id number]   CATARACT EXTRACTION W/PHACO Left 01/28/2015   Procedure: CATARACT EXTRACTION PHACO AND INTRAOCULAR LENS PLACEMENT (IOC);  Surgeon: Galen Manila, MD;  Location: [facility] ORS;  Service: Ophthalmology;  Laterality: Left;  [place] 00:18 AP%7.1 CDE1.32 fluid pack lot # [id number]   Cerebral Catheterization for Brain Bleed  02/02/2019   [facility]   COLON SURGERY     COLONOSCOPY WITH PROPOFOL N/A 05/07/2020   Procedure: COLONOSCOPY WITH PROPOFOL;  Surgeon: Toney Reil, MD;  Location: [facility] ENDOSCOPY;  Service: Gastroenterology;  Laterality: N/A;   COLONOSCOPY WITH PROPOFOL N/A 06/13/2020   Procedure: COLONOSCOPY WITH PROPOFOL;  Surgeon: Midge Minium, MD;  Location: [facility] ENDOSCOPY;  Service: Endoscopy;  Laterality: N/A;   ESOPHAGOGASTRODUODENOSCOPY (EGD) WITH PROPOFOL N/A 05/07/2020   Procedure: ESOPHAGOGASTRODUODENOSCOPY (EGD) WITH PROPOFOL;  Surgeon: Toney Reil, MD;  Location: [facility] ENDOSCOPY;  Service: Gastroenterology;  Laterality: N/A;   ESOPHAGOGASTRODUODENOSCOPY (EGD) WITH PROPOFOL N/A 06/13/2020   Procedure: ESOPHAGOGASTRODUODENOSCOPY (EGD) WITH PROPOFOL;  Surgeon: Midge Minium, MD;  Location: [facility] ENDOSCOPY;  Service: Endoscopy;  Laterality: N/A;   ESOPHAGOGASTRODUODENOSCOPY (EGD) WITH PROPOFOL N/A 06/23/2022   Procedure: ESOPHAGOGASTRODUODENOSCOPY (EGD) WITH PROPOFOL;  Surgeon: Wyline Mood, MD;  Location: [facility] ENDOSCOPY;  Service: Gastroenterology;   Laterality: N/A;   EYE SURGERY     LEEP     XI ROBOT ABDOMINAL PERINEAL RESECTION N/A 01/24/2019   Procedure: Robot assisted appendectomy ;  Surgeon: Campbell Lerner, MD;  Location: [facility] ORS;  Service: General;  Laterality: N/A;   Family History  Problem Relation Age of Onset   Depression Mother    Heart disease Father    Hypertension Father    Diabetes Father    Post-traumatic stress disorder Sister    Hypertension Sister    Ovarian cancer Maternal Aunt        40's   Diabetes Brother    Depression Brother    Hypertension Brother    Breast cancer Neg Hx    Social History   Socioeconomic History   Marital status: Divorced    Spouse name: Not on file   Number of children: 1   Years of education: Not on file   Highest education level: Associate degree: occupational, Scientist, product/process development, or vocational program  Occupational History    Comment:  DISABLED  Tobacco Use   Smoking status: Every Day    Current packs/day: 1.00    Types: Cigarettes   Smokeless tobacco: Never  Vaping Use   Vaping status: Every Day   Substances: CBD  Substance and Sexual Activity   Alcohol use: No   Drug use: Yes    Types: Marijuana    Comment: 06/22/2022   Sexual activity: Never  Other Topics Concern   Not on file  Social History Narrative   Not on file   Social Drivers of Health   Financial Resource Strain: Medium Risk (06/07/2023)   Overall Financial Resource Strain (CARDIA)    Difficulty of Paying Living Expenses: Somewhat hard  Food Insecurity: Food Insecurity Present (06/07/2023)   Hunger Vital Sign    Worried About Running Out of Food in the Last Year: Sometimes true    Ran Out of Food in the Last Year: Sometimes true  Transportation Needs: No Transportation Needs (06/07/2023)   PRAPARE - Administrator, Civil Service (Medical): No    Lack of Transportation (Non-Medical): No  Physical Activity: Sufficiently Active (06/07/2023)   Exercise Vital Sign    Days of Exercise per Week: 7 days     Minutes of Exercise per Session: 30 min  Stress: Stress Concern Present (06/07/2023)   [employer] of Occupational Health - Occupational Stress Questionnaire    Feeling of Stress : To some extent  Social Connections: Socially Isolated (06/07/2023)   Social Connection and Isolation Panel [NHANES]    Frequency of Communication with Friends and Family: More than three times a week    Frequency of Social Gatherings with Friends and Family: More than three times a week    Attends Religious Services: Never    Database administrator or Organizations: No    Attends Engineer, structural: Never    Marital Status: Divorced    Tobacco Counseling Ready to quit: Not Answered Counseling given: Not Answered    Clinical Intake:  Pre-visit preparation completed: Yes  Pain : 0-10 Pain Score: 4  Pain Type: Chronic pain Pain Location: Back Pain Orientation: Lower Pain Descriptors / Indicators: Aching, Constant Pain Onset: More than a month ago Pain Frequency: Constant Pain Relieving Factors: muscle relaxer  Pain Relieving Factors: muscle relaxer  BMI - recorded: 15.98 Nutritional Status: BMI <19  Underweight Nutritional Risks: None Diabetes: No  No results found for: "HGBA1C"   How often do you need to have someone help you when you read instructions, pamphlets, or other written materials from your doctor or pharmacy?: 1 - Never  Interpreter Needed?: No  Information entered by :: Kennedy Bucker, LPN   Activities of Daily Living    06/07/2023    9:42 AM 01/12/2023    1:10 PM  In your present state of health, do you have any difficulty performing the following activities:  Hearing? 0 0  Vision? 0 0  Difficulty concentrating or making decisions? 1 1  Comment FROM TBI   Walking or climbing stairs? 1 1  Dressing or bathing? 0 0  Doing errands, shopping? 0 0  Preparing Food and eating ? N   Using the Toilet? N   In the past six months, have you accidently leaked  urine? N   Do you have problems with loss of bowel control? N   Managing your Medications? N   Managing your Finances? Y   Housekeeping or managing your Housekeeping? N     Patient Care Team: Angelica Chessman,  Jimmy Footman as PCP - General (Family Medicine) Stephenie Acres, MD as Referring Physician (Neurosurgery) Cherlyn Cushing (Neurosurgery) Morene Crocker, MD as Referring Physician (Neurology) Galen Manila, MD as Referring Physician (Ophthalmology)  Indicate any recent Medical Services you may have received from other than [facility] providers in the past year (date may be approximate).     Assessment:   This is a routine wellness examination for [facility].  Hearing/Vision screen Hearing Screening - Comments:: NO AIDS Vision Screening - Comments:: READERS, COMPUTER- HAS HAD CATARACT SGY BOTH EYES- DR.PORFILIO   Goals Addressed             This Visit's Progress    DIET - EAT MORE FRUITS AND VEGETABLES         Depression Screen     06/07/2023    9:36 AM 05/31/2023    1:03 PM 01/12/2023    1:14 PM 11/08/2022   10:34 AM 05/21/2022   10:27 AM 05/07/2022    9:21 AM 05/04/2022    8:58 AM  PHQ 2/9 Scores  PHQ - 2 Score 4 6 6 3 6 6 6   PHQ- 9 Score 5 20 22 14 17 17 20     Fall Risk     06/07/2023    9:41 AM 05/31/2023    1:03 PM 01/12/2023    1:09 PM 11/08/2022   10:34 AM 05/21/2022   10:23 AM  Fall Risk   Falls in the past year? 1 0 1 1 1   Number falls in past yr: 1 0 1 1 0  Injury with Fall? 0 0 1 0 0  Risk for fall due to : Mental status change;Other (Comment) No Fall Risks History of fall(s);Impaired mobility Impaired balance/gait No Fall Risks  Risk for fall due to: Comment TBI      Follow up Falls evaluation completed;Falls prevention discussed Falls prevention discussed  Falls prevention discussed;Education provided;Falls evaluation completed Education provided;Falls prevention discussed    MEDICARE RISK AT HOME:  Medicare Risk at Home Any stairs in or around the home?:  No If so, are there any without handrails?: No Home free of loose throw rugs in walkways, pet beds, electrical cords, etc?: Yes Adequate lighting in your home to reduce risk of falls?: Yes Life alert?: No Use of a cane, walker or w/c?: Yes (CANE IF FEELS WEAK & HAVE TO WALK A LOT) Grab bars in the bathroom?: No Shower chair or bench in shower?: No Elevated toilet seat or a handicapped toilet?: No  TIMED UP AND GO:  Was the test performed?  No  Cognitive Function: PT DECLINED D/T TBI & PTSD- SHE IS ALERT & ORIENTED X 3        05/21/2022   10:45 AM  6CIT Screen  What Year? 0 points  What month? 0 points  What time? 0 points  Count back from 20 0 points  Months in reverse 0 points  Repeat phrase 0 points  Total Score 0 points    Immunizations Immunization History  Administered Date(s) Administered   H1N1 02/09/2008   Influenza, Seasonal, Injecte, Preservative Fre 12/13/2005, 01/20/2007, 11/20/2007, 12/23/2008, 12/29/2009, 12/16/2010, 12/28/2012   PPD Test 09/20/2006   Tdap 09/05/2017    Screening Tests Health Maintenance  Topic Date Due   Pneumococcal Vaccine 28-61 Years old (1 of 2 - PCV) Never done   Lung Cancer Screening  05/26/2024   Medicare Annual Wellness (AWV)  06/06/2024   MAMMOGRAM  05/11/2025   Cervical Cancer Screening (HPV/Pap  Cotest)  09/08/2025   Colonoscopy  06/14/2027   DTaP/Tdap/Td (2 - Td or Tdap) 09/06/2027   Hepatitis C Screening  Completed   HIV Screening  Completed   HPV VACCINES  Aged Out   INFLUENZA VACCINE  Discontinued   COVID-19 Vaccine  Discontinued   Zoster Vaccines- Shingrix  Discontinued    Health Maintenance  Health Maintenance Due  Topic Date Due   Pneumococcal Vaccine 40-75 Years old (1 of 2 - PCV) Never done   Health Maintenance Items Addressed: UP TO DATE ON MAMMOGRAM, COLONOSCOPY-  Additional Screening:  Vision Screening: Recommended annual ophthalmology exams for early detection of glaucoma and other disorders of  the eye.  Dental Screening: Recommended annual dental exams for proper oral hygiene  Community Resource Referral / Chronic Care Management: CRR required this visit?  No   CCM required this visit?  No     Plan:     I have personally reviewed and noted the following in the patient's chart:   Medical and social history Use of alcohol, tobacco or illicit drugs  Current medications and supplements including opioid prescriptions. Patient is not currently taking opioid prescriptions. Functional ability and status Nutritional status Physical activity Advanced directives List of other physicians Hospitalizations, surgeries, and ER visits in previous 12 months Vitals Screenings to include cognitive, depression, and falls Referrals and appointments  In addition, I have reviewed and discussed with patient certain preventive protocols, quality metrics, and best practice recommendations. A written personalized care plan for preventive services as well as general preventive health recommendations were provided to patient.     Hal Hope, LPN   08/02/8754   After Visit Summary: (MyChart) Due to this being a telephonic visit, the after visit summary with patients personalized plan was offered to patient via MyChart   Notes: Nothing significant to report at this time.- REFERRAL SENT FOR FOOD/FINANCIAL DIFFICULTIES

## 2023-06-09 ENCOUNTER — Telehealth: Payer: Self-pay

## 2023-06-09 NOTE — Progress Notes (Addendum)
 Subjective:   [name] is a 58 y.o. who presents for a Medicare Wellness preventive visit.  Visit Complete: Virtual I connected with  [name] on 06/09/23 by a video and audio enabled telemedicine application and verified that I am speaking with the correct person using two identifiers.  Patient Location: Home  Provider Location: Office/Clinic  I discussed the limitations of evaluation and management by telemedicine. The patient expressed understanding and agreed to proceed.  Vital Signs: Because this visit was a virtual/telehealth visit, some criteria may be missing or patient reported. Any vitals not documented were not able to be obtained and vitals that have been documented are patient reported.   Persons Participating in Visit: Patient.  AWV Questionnaire: No: Patient Medicare AWV questionnaire was not completed prior to this visit.  Cardiac Risk Factors include: advanced age (>57men, >93 women);dyslipidemia;hypertension;smoking/ tobacco exposure     Objective:    Today's Vitals   06/07/23 0932  PainSc: 4    There is no height or weight on file to calculate BMI.     06/07/2023    9:40 AM 05/23/2023    6:35 PM 01/07/2023    5:58 AM 06/23/2022   10:24 AM 05/21/2022   10:33 AM 06/13/2020    8:09 AM 05/01/2020    2:22 PM  Advanced Directives  Does Patient Have a Medical Advance Directive? No No No No Yes No No  Type of Advance Directive     Healthcare Power of Attorney    Would patient like information on creating a medical advance directive? No - Patient declined  No - Patient declined No - Patient declined   No - Patient declined    Current Medications (verified) Outpatient Encounter Medications as of 06/07/2023  Medication Sig   albuterol (VENTOLIN HFA) 108 (90 Base) MCG/ACT inhaler INHALE 2 PUFFS BY MOUTH EVERY 4 TO 6 HOURS AS NEEDED   carisoprodol (SOMA) 350 MG tablet Take 700 mg by mouth 2 (two) times daily.   desvenlafaxine (PRISTIQ) 100 MG 24 hr tablet TAKE 1  TABLET BY MOUTH EVERY DAY   lamoTRIgine (LAMICTAL) 100 MG tablet Take 1 tablet by mouth 2 (two) times daily.   linaclotide (LINZESS) 145 MCG CAPS capsule Take 1 capsule (145 mcg total) by mouth daily before breakfast.   nystatin (MYCOSTATIN) 100000 UNIT/ML suspension Take 5 mLs (500,000 Units total) by mouth 4 (four) times daily.   ondansetron (ZOFRAN-ODT) 8 MG disintegrating tablet Take 1 tablet (8 mg total) by mouth every 8 (eight) hours as needed for nausea or vomiting.   pantoprazole (PROTONIX) 40 MG tablet TAKE 1 TABLET BY MOUTH TWICE A DAY   prochlorperazine (COMPAZINE) 10 MG tablet Take 1 tablet (10 mg total) by mouth 2 (two) times daily as needed for nausea or vomiting (Nausea ).   lamoTRIgine (LAMICTAL) 25 MG tablet Take 1 tablet (25 mg total) by mouth 2 (two) times daily for 5 days. Please take in addition to the 100mg  lamictal dose for a total dose of 125mg  twice a day   linaclotide (LINZESS) 290 MCG CAPS capsule TAKE 1 CAPSULE BY MOUTH DAILY. (Patient not taking: Reported on 06/07/2023)   No facility-administered encounter medications on file as of 06/07/2023.    Allergies (verified) Contrast media [iodinated contrast media], Diphenhydramine hcl, Adhesive [tape], Iodine, Prednisone, and Sulfa antibiotics   History: Past Medical History:  Diagnosis Date   BRBPR (bright red blood per rectum)    Breast mass, left    Cancer (HCC)  CERVICAL   Crushing injury of neck 09/27/2011   Depression    Falls    EASILY   GERD (gastroesophageal reflux disease)    Head injury 05/06/2011   History of subarachnoid hemorrhage 05/07/2019   Formatting of this note might be different from the original. December 2020   PTSD (post-traumatic stress disorder)    PTSD (post-traumatic stress disorder)    S/P laparoscopic appendectomy 02/15/2019   01/24/2019, for distal SBO, and nl appendix.    SAH (subarachnoid hemorrhage) (HCC) 04/06/2019   SBO (small bowel obstruction) (HCC) 05/01/2020   Seizures (HCC)     Shoulder disorder    LIMITED USE OF LEFT SHOULDER AND ARM   TBI (traumatic brain injury) (HCC)    SKULL FX 2009   Vertigo    Vertigo    Past Surgical History:  Procedure Laterality Date   abd pain     APPENDECTOMY     BACK SURGERY     LUMBAR FUSIONS X 2   CATARACT EXTRACTION W/PHACO Right 01/07/2015   Procedure: CATARACT EXTRACTION PHACO AND INTRAOCULAR LENS PLACEMENT (IOC);  Surgeon: Galen Manila, MD;  Location: [facility] ORS;  Service: Ophthalmology;  Laterality: Right;  [place] 0 AP% 0 CDE 0 fluid pack lot #[id number]   CATARACT EXTRACTION W/PHACO Left 01/28/2015   Procedure: CATARACT EXTRACTION PHACO AND INTRAOCULAR LENS PLACEMENT (IOC);  Surgeon: Galen Manila, MD;  Location: [facility] ORS;  Service: Ophthalmology;  Laterality: Left;  [place] 00:18 AP%7.1 CDE1.32 fluid pack lot # [id number]   Cerebral Catheterization for Brain Bleed  02/02/2019   [facility]   COLON SURGERY     COLONOSCOPY WITH PROPOFOL N/A 05/07/2020   Procedure: COLONOSCOPY WITH PROPOFOL;  Surgeon: Toney Reil, MD;  Location: [facility] ENDOSCOPY;  Service: Gastroenterology;  Laterality: N/A;   COLONOSCOPY WITH PROPOFOL N/A 06/13/2020   Procedure: COLONOSCOPY WITH PROPOFOL;  Surgeon: Midge Minium, MD;  Location: [facility] ENDOSCOPY;  Service: Endoscopy;  Laterality: N/A;   ESOPHAGOGASTRODUODENOSCOPY (EGD) WITH PROPOFOL N/A 05/07/2020   Procedure: ESOPHAGOGASTRODUODENOSCOPY (EGD) WITH PROPOFOL;  Surgeon: Toney Reil, MD;  Location: [facility] ENDOSCOPY;  Service: Gastroenterology;  Laterality: N/A;   ESOPHAGOGASTRODUODENOSCOPY (EGD) WITH PROPOFOL N/A 06/13/2020   Procedure: ESOPHAGOGASTRODUODENOSCOPY (EGD) WITH PROPOFOL;  Surgeon: Midge Minium, MD;  Location: [facility] ENDOSCOPY;  Service: Endoscopy;  Laterality: N/A;   ESOPHAGOGASTRODUODENOSCOPY (EGD) WITH PROPOFOL N/A 06/23/2022   Procedure: ESOPHAGOGASTRODUODENOSCOPY (EGD) WITH PROPOFOL;  Surgeon: Wyline Mood, MD;  Location: [facility] ENDOSCOPY;  Service: Gastroenterology;   Laterality: N/A;   EYE SURGERY     LEEP     XI ROBOT ABDOMINAL PERINEAL RESECTION N/A 01/24/2019   Procedure: Robot assisted appendectomy ;  Surgeon: Campbell Lerner, MD;  Location: [facility] ORS;  Service: General;  Laterality: N/A;   Family History  Problem Relation Age of Onset   Depression Mother    Heart disease Father    Hypertension Father    Diabetes Father    Post-traumatic stress disorder Sister    Hypertension Sister    Ovarian cancer Maternal Aunt        40's   Diabetes Brother    Depression Brother    Hypertension Brother    Breast cancer Neg Hx    Social History   Socioeconomic History   Marital status: Divorced    Spouse name: Not on file   Number of children: 1   Years of education: Not on file   Highest education level: Associate degree: occupational, Scientist, product/process development, or vocational program  Occupational History    Comment:  DISABLED  Tobacco Use   Smoking status: Every Day    Current packs/day: 1.00    Types: Cigarettes   Smokeless tobacco: Never  Vaping Use   Vaping status: Every Day   Substances: CBD  Substance and Sexual Activity   Alcohol use: No   Drug use: Yes    Types: Marijuana    Comment: 06/22/2022   Sexual activity: Never  Other Topics Concern   Not on file  Social History Narrative   Not on file   Social Drivers of Health   Financial Resource Strain: Medium Risk (06/07/2023)   Overall Financial Resource Strain (CARDIA)    Difficulty of Paying Living Expenses: Somewhat hard  Food Insecurity: Food Insecurity Present (06/07/2023)   Hunger Vital Sign    Worried About Running Out of Food in the Last Year: Sometimes true    Ran Out of Food in the Last Year: Sometimes true  Transportation Needs: No Transportation Needs (06/07/2023)   PRAPARE - Administrator, Civil Service (Medical): No    Lack of Transportation (Non-Medical): No  Physical Activity: Sufficiently Active (06/07/2023)   Exercise Vital Sign    Days of Exercise per Week: 7 days     Minutes of Exercise per Session: 30 min  Stress: Stress Concern Present (06/07/2023)   [employer] of Occupational Health - Occupational Stress Questionnaire    Feeling of Stress : To some extent  Social Connections: Socially Isolated (06/07/2023)   Social Connection and Isolation Panel [NHANES]    Frequency of Communication with Friends and Family: More than three times a week    Frequency of Social Gatherings with Friends and Family: More than three times a week    Attends Religious Services: Never    Database administrator or Organizations: No    Attends Engineer, structural: Never    Marital Status: Divorced    Tobacco Counseling Ready to quit: Not Answered Counseling given: Not Answered    Clinical Intake:  Pre-visit preparation completed: Yes  Pain : 0-10 Pain Score: 4  Pain Type: Chronic pain Pain Location: Back Pain Orientation: Lower Pain Descriptors / Indicators: Aching, Constant Pain Onset: More than a month ago Pain Frequency: Constant Pain Relieving Factors: muscle relaxer  Pain Relieving Factors: muscle relaxer  BMI - recorded: 15.98 Nutritional Status: BMI <19  Underweight Nutritional Risks: None Diabetes: No  No results found for: "HGBA1C"   How often do you need to have someone help you when you read instructions, pamphlets, or other written materials from your doctor or pharmacy?: 1 - Never  Interpreter Needed?: No  Information entered by :: Kennedy Bucker, LPN   Activities of Daily Living    06/07/2023    9:42 AM 01/12/2023    1:10 PM  In your present state of health, do you have any difficulty performing the following activities:  Hearing? 0 0  Vision? 0 0  Difficulty concentrating or making decisions? 1 1  Comment FROM TBI   Walking or climbing stairs? 1 1  Dressing or bathing? 0 0  Doing errands, shopping? 0 0  Preparing Food and eating ? N   Using the Toilet? N   In the past six months, have you accidently leaked  urine? N   Do you have problems with loss of bowel control? N   Managing your Medications? N   Managing your Finances? Y   Housekeeping or managing your Housekeeping? N     Patient Care Team: Angelica Chessman,  Jimmy Footman as PCP - General (Family Medicine) Stephenie Acres, MD as Referring Physician (Neurosurgery) Cherlyn Cushing (Neurosurgery) Morene Crocker, MD as Referring Physician (Neurology) Galen Manila, MD as Referring Physician (Ophthalmology)  Indicate any recent Medical Services you may have received from other than [facility] providers in the past year (date may be approximate).     Assessment:   This is a routine wellness examination for [facility].  Hearing/Vision screen Hearing Screening - Comments:: NO AIDS Vision Screening - Comments:: READERS, COMPUTER- HAS HAD CATARACT SGY BOTH EYES- DR.PORFILIO   Goals Addressed             This Visit's Progress    DIET - EAT MORE FRUITS AND VEGETABLES         Depression Screen     06/07/2023    9:36 AM 05/31/2023    1:03 PM 01/12/2023    1:14 PM 11/08/2022   10:34 AM 05/21/2022   10:27 AM 05/07/2022    9:21 AM 05/04/2022    8:58 AM  PHQ 2/9 Scores  PHQ - 2 Score 4 6 6 3 6 6 6   PHQ- 9 Score 5 20 22 14 17 17 20     Fall Risk     06/07/2023    9:41 AM 05/31/2023    1:03 PM 01/12/2023    1:09 PM 11/08/2022   10:34 AM 05/21/2022   10:23 AM  Fall Risk   Falls in the past year? 1 0 1 1 1   Number falls in past yr: 1 0 1 1 0  Injury with Fall? 0 0 1 0 0  Risk for fall due to : Mental status change;Other (Comment) No Fall Risks History of fall(s);Impaired mobility Impaired balance/gait No Fall Risks  Risk for fall due to: Comment TBI      Follow up Falls evaluation completed;Falls prevention discussed Falls prevention discussed  Falls prevention discussed;Education provided;Falls evaluation completed Education provided;Falls prevention discussed    MEDICARE RISK AT HOME:  Medicare Risk at Home Any stairs in or around the home?:  No If so, are there any without handrails?: No Home free of loose throw rugs in walkways, pet beds, electrical cords, etc?: Yes Adequate lighting in your home to reduce risk of falls?: Yes Life alert?: No Use of a cane, walker or w/c?: Yes (CANE IF FEELS WEAK & HAVE TO WALK A LOT) Grab bars in the bathroom?: No Shower chair or bench in shower?: No Elevated toilet seat or a handicapped toilet?: No  TIMED UP AND GO:  Was the test performed?  No  Cognitive Function: PT DECLINED D/T TBI & PTSD- SHE IS ALERT & ORIENTED X 3        05/21/2022   10:45 AM  6CIT Screen  What Year? 0 points  What month? 0 points  What time? 0 points  Count back from 20 0 points  Months in reverse 0 points  Repeat phrase 0 points  Total Score 0 points    Immunizations Immunization History  Administered Date(s) Administered   H1N1 02/09/2008   Influenza, Seasonal, Injecte, Preservative Fre 12/13/2005, 01/20/2007, 11/20/2007, 12/23/2008, 12/29/2009, 12/16/2010, 12/28/2012   PPD Test 09/20/2006   Tdap 09/05/2017    Screening Tests Health Maintenance  Topic Date Due   Pneumococcal Vaccine 35-30 Years old (1 of 2 - PCV) Never done   MAMMOGRAM  05/11/2024   Lung Cancer Screening  05/26/2024   Medicare Annual Wellness (AWV)  06/06/2024   Cervical Cancer Screening (HPV/Pap  Cotest)  09/08/2025   Colonoscopy  06/14/2027   DTaP/Tdap/Td (2 - Td or Tdap) 09/06/2027   Hepatitis C Screening  Completed   HIV Screening  Completed   HPV VACCINES  Aged Out   Meningococcal B Vaccine  Aged Out   INFLUENZA VACCINE  Discontinued   COVID-19 Vaccine  Discontinued   Zoster Vaccines- Shingrix  Discontinued    Health Maintenance  Health Maintenance Due  Topic Date Due   Pneumococcal Vaccine 81-29 Years old (1 of 2 - PCV) Never done   Health Maintenance Items Addressed: UP TO DATE ON MAMMOGRAM, COLONOSCOPY-  Additional Screening:  Vision Screening: Recommended annual ophthalmology exams for early detection  of glaucoma and other disorders of the eye.  Dental Screening: Recommended annual dental exams for proper oral hygiene  Community Resource Referral / Chronic Care Management: CRR required this visit?  No   CCM required this visit?  No     Plan:     I have personally reviewed and noted the following in the patient's chart:   Medical and social history Use of alcohol, tobacco or illicit drugs  Current medications and supplements including opioid prescriptions. Patient is not currently taking opioid prescriptions. Functional ability and status Nutritional status Physical activity Advanced directives List of other physicians Hospitalizations, surgeries, and ER visits in previous 12 months Vitals Screenings to include cognitive, depression, and falls Referrals and appointments  In addition, I have reviewed and discussed with patient certain preventive protocols, quality metrics, and best practice recommendations. A written personalized care plan for preventive services as well as general preventive health recommendations were provided to patient.     Hal Hope, LPN   10/09/9145   After Visit Summary: (MyChart) Due to this being a telephonic visit, the after visit summary with patients personalized plan was offered to patient via MyChart   Notes: Nothing significant to report at this time.- REFERRAL SENT FOR FOOD/FINANCIAL DIFFICULTIES

## 2023-06-09 NOTE — Progress Notes (Signed)
 Complex Care Management Note Care Guide Note  06/09/2023 Name: [name] MRN: [id number] DOB: 21-Jun-1965   Complex Care Management Outreach Attempts: An unsuccessful telephone outreach was attempted today to offer the patient information about available complex care management services.  Follow Up Plan:  Additional outreach attempts will be made to offer the patient complex care management information and services.   Encounter Outcome:  No Answer  Keliyah Spillman [facility]  [facility] Guide Direct Dial: [phone]  Fax: [phone] Website: [email or web address]

## 2023-06-10 ENCOUNTER — Telehealth: Payer: Self-pay

## 2023-06-10 NOTE — Progress Notes (Signed)
 Complex Care Management Note Care Guide Note  06/10/2023 Name: [name] MRN: [id number] DOB: 01-29-1966   Complex Care Management Outreach Attempts: A second unsuccessful outreach was attempted today to offer the patient with information about available complex care management services.  Follow Up Plan:  Additional outreach attempts will be made to offer the patient complex care management information and services.   Encounter Outcome:  No Answer  [name]  [facility] Guide Direct Dial: [phone]  Fax: [phone] Website: [email or web address]

## 2023-06-13 ENCOUNTER — Telehealth: Payer: Self-pay

## 2023-06-13 NOTE — Progress Notes (Signed)
 Complex Care Management Note Care Guide Note  06/13/2023 Name: [name] MRN: [id number] DOB: 1966-01-14   Complex Care Management Outreach Attempts: A third unsuccessful outreach was attempted today to offer the patient with information about available complex care management services.  Follow Up Plan:  No further outreach attempts will be made at this time. We have been unable to contact the patient to offer or enroll patient in complex care management services.  Encounter Outcome:  No Answer  Sanel Stemmer [facility]  [facility] Guide Direct Dial: [phone]  Fax: [phone] Website: [email or web address]

## 2023-06-13 NOTE — Progress Notes (Signed)
 Entered in error

## 2023-06-15 ENCOUNTER — Encounter: Payer: Self-pay | Admitting: Family Medicine

## 2023-07-01 ENCOUNTER — Other Ambulatory Visit: Payer: Self-pay | Admitting: Family Medicine

## 2023-07-01 DIAGNOSIS — F482 Pseudobulbar affect: Secondary | ICD-10-CM

## 2023-07-01 DIAGNOSIS — E782 Mixed hyperlipidemia: Secondary | ICD-10-CM

## 2023-07-01 DIAGNOSIS — S069XAS Unspecified intracranial injury with loss of consciousness status unknown, sequela: Secondary | ICD-10-CM

## 2023-07-01 DIAGNOSIS — F0630 Mood disorder due to known physiological condition, unspecified: Secondary | ICD-10-CM

## 2023-07-05 NOTE — Telephone Encounter (Signed)
 Requested Prescriptions  Pending Prescriptions Disp Refills   albuterol  (VENTOLIN  HFA) 108 (90 Base) MCG/ACT inhaler [Pharmacy Med Name: ALBUTEROL  HFA (PROAIR ) INHALER] 8.5 each 0    Sig: INHALE 2 PUFFS BY MOUTH EVERY 4 TO 6 HOURS AS NEEDED     Pulmonology:  Beta Agonists 2 Failed - 07/05/2023  9:52 AM      Failed - Valid encounter within last 12 months    Recent Outpatient Visits           1 month ago Other constipation   [facility] Adeline Hone, PA-C   1 month ago Nausea, vomiting and diarrhea   [facility] [place], Jennie Moeller, PA-C              Passed - Last BP in normal range    BP Readings from Last 1 Encounters:  05/31/23 128/68         Passed - Last Heart Rate in normal range    Pulse Readings from Last 1 Encounters:  05/31/23 61          desvenlafaxine  (PRISTIQ ) 100 MG 24 hr tablet [Pharmacy Med Name: DESVENLAFAXINE  SUCCNT ER 100MG ] 90 tablet 0    Sig: TAKE 1 TABLET BY MOUTH EVERY DAY     Psychiatry: Antidepressants - SNRI - desvenlafaxine  & venlafaxine  Failed - 07/05/2023  9:52 AM      Failed - Valid encounter within last 6 months    Recent Outpatient Visits           1 month ago Other constipation   [facility] Adeline Hone, PA-C   1 month ago Nausea, vomiting and diarrhea   [facility] [place], PA-C              Failed - Lipid Panel in normal range within the last 12 months    Cholesterol  Date Value Ref Range Status  11/08/2022 274 (H) <200 mg/dL Final   LDL Cholesterol (Calc)  Date Value Ref Range Status  11/08/2022 199 (H) mg/dL (calc) Final    Comment:    LDL-C levels > or = 190 mg/dL may indicate familial  hypercholesterolemia (FH). Clinical assessment and  measurement of blood lipid levels should be  considered for all first degree relatives of patients with an FH diagnosis. LDL Cholesterol (LDL-C) levels > or = 300 mg/dL may indicate  homozygous familial hypercholesterolemia (HoFH). Untreated,  these extremely high LDL-C levels can result in premature CV events and mortality. Patients should be identified early and provided appropriate interventions to reduce the cumulative LDL-C burden from birth. . For questions about testing for familial hypercholesterolemia, please call Engineer, materials at 1.866.GENE.INFO. Norberto Bear, et al. J National Lipid Association  Recommendations for Patient-Centered Management of Dyslipidemia: Part 1 Journal of  Clinical Lipidology 2015;9(2), 129-169. Cuchel, M. et al. (2014). Homozygous familial hypercholesterolaemia: new insights and guidance for clinicians to improve detection and clinical management. European Heart Journal, 35(32), [phone]. Reference range: <100 . Desirable range <100 mg/dL for primary prevention;   <70 mg/dL for patients with CHD or diabetic patients  with > or = 2 CHD risk factors. Aaron Aas LDL-C is now calculated using the Martin-Hopkins  calculation, which is a validated novel method providing  better accuracy than the Friedewald equation in the  estimation of LDL-C.  Melinda Sprawls et al. Erroll Heard. 5409;811(91): 2061-2068  (http://education.[employer].com/faq/FAQ164)    HDL  Date Value Ref Range Status  11/08/2022  40 (L) > OR = 50 mg/dL Final   Triglycerides  Date Value Ref Range Status  11/08/2022 173 (H) <150 mg/dL Final         Passed - Cr in normal range and within 360 days    Creat  Date Value Ref Range Status  11/08/2022 0.64 0.50 - 1.03 mg/dL Final   Creatinine, Ser  Date Value Ref Range Status  05/23/2023 0.64 0.44 - 1.00 mg/dL Final         Passed - Completed PHQ-2 or PHQ-9 in the last 360 days      Passed - Last BP in normal range    BP Readings from Last 1 Encounters:  05/31/23 128/68         Refused Prescriptions Disp Refills   pantoprazole  (PROTONIX ) 40 MG tablet [Pharmacy Med Name: PANTOPRAZOLE  SOD DR 40 MG  TAB] 30 tablet 1    Sig: TAKE 1 TABLET BY MOUTH EVERY DAY     Gastroenterology: Proton Pump Inhibitors Failed - 07/05/2023  9:52 AM      Failed - Valid encounter within last 12 months    Recent Outpatient Visits           1 month ago Other constipation   [facility] Adeline Hone, PA-C   1 month ago Nausea, vomiting and diarrhea   [facility] Adeline Hone, PA-C

## 2023-07-06 ENCOUNTER — Ambulatory Visit: Admitting: Family Medicine

## 2023-07-06 ENCOUNTER — Other Ambulatory Visit: Payer: Self-pay

## 2023-07-06 ENCOUNTER — Encounter: Payer: Self-pay | Admitting: Family Medicine

## 2023-07-06 VITALS — BP 132/78 | HR 82 | Temp 98.1°F | Resp 16 | Ht 67.0 in | Wt 115.6 lb

## 2023-07-06 DIAGNOSIS — R1084 Generalized abdominal pain: Secondary | ICD-10-CM

## 2023-07-06 DIAGNOSIS — R634 Abnormal weight loss: Secondary | ICD-10-CM | POA: Diagnosis not present

## 2023-07-06 DIAGNOSIS — E46 Unspecified protein-calorie malnutrition: Secondary | ICD-10-CM

## 2023-07-06 DIAGNOSIS — S069X9S Unspecified intracranial injury with loss of consciousness of unspecified duration, sequela: Secondary | ICD-10-CM

## 2023-07-06 DIAGNOSIS — F482 Pseudobulbar affect: Secondary | ICD-10-CM

## 2023-07-06 DIAGNOSIS — R112 Nausea with vomiting, unspecified: Secondary | ICD-10-CM

## 2023-07-06 DIAGNOSIS — S069XAS Unspecified intracranial injury with loss of consciousness status unknown, sequela: Secondary | ICD-10-CM

## 2023-07-06 DIAGNOSIS — R197 Diarrhea, unspecified: Secondary | ICD-10-CM

## 2023-07-06 DIAGNOSIS — F0630 Mood disorder due to known physiological condition, unspecified: Secondary | ICD-10-CM

## 2023-07-06 MED ORDER — PROCHLORPERAZINE MALEATE 10 MG PO TABS: 10.0000 mg | ORAL_TABLET | Freq: Two times a day (BID) | ORAL | 1 refills | Status: DC | PRN

## 2023-07-06 MED ORDER — DESVENLAFAXINE SUCCINATE ER 100 MG PO TB24: 100.0000 mg | ORAL_TABLET | Freq: Every day | ORAL | 2 refills | Status: DC

## 2023-07-06 NOTE — Progress Notes (Signed)
 Name: [name]   MRN: [id number]    DOB: 1965-08-30   Date:07/06/2023       Progress Note  Chief Complaint  Patient presents with   Follow-up    1 month recheck     Subjective:   [name] is a 59 y.o. female, presents to clinic for routine follow up on chronic conditions  Here for weight check- was losing weight, abd pain, constipation, cramping suspected marijuana related GI sx, she reports a lot of her chronic sx are better controlled now and she is able to eat a lot more, eating cereal, yogurts, she's gained 13 lbs back- Wt Readings from Last 5 Encounters:  07/06/23 115 lb 9.6 oz (52.4 kg)  05/31/23 102 lb (46.3 kg)  01/12/23 119 lb 12.8 oz (54.3 kg)  01/05/23 117 lb (53.1 kg)  11/08/22 120 lb 9.6 oz (54.7 kg)   BMI Readings from Last 5 Encounters:  07/06/23 18.11 kg/m  05/31/23 15.98 kg/m  05/24/23 18.49 kg/m  01/12/23 18.22 kg/m  01/05/23 17.79 kg/m   Having BM daily but fear of not completely emptying bowels and having bowel obstruction - seeing GI and she has gone from lower dose linzess  to higher since eating more  Cut way back on cigarette smoking, maybe 2-3 puffs here and there but not totally quit, vaping THC she has not decreased amount much   Current Outpatient Medications:    albuterol  (VENTOLIN  HFA) 108 (90 Base) MCG/ACT inhaler, INHALE 2 PUFFS BY MOUTH EVERY 4 TO 6 HOURS AS NEEDED, Disp: 8.5 each, Rfl: 0   carisoprodol  (SOMA ) 350 MG tablet, Take 700 mg by mouth 2 (two) times daily., Disp: , Rfl:    desvenlafaxine  (PRISTIQ ) 100 MG 24 hr tablet, TAKE 1 TABLET BY MOUTH EVERY DAY, Disp: 90 tablet, Rfl: 0   lamoTRIgine  (LAMICTAL ) 100 MG tablet, Take 1 tablet by mouth 2 (two) times daily., Disp: , Rfl:    linaclotide  (LINZESS ) 145 MCG CAPS capsule, Take 1 capsule (145 mcg total) by mouth daily before breakfast., Disp: 30 capsule, Rfl: 0   nystatin  (MYCOSTATIN ) 100000 UNIT/ML suspension, Take 5 mLs (500,000 Units total) by mouth 4 (four) times daily.,  Disp: 60 mL, Rfl: 0   ondansetron  (ZOFRAN -ODT) 8 MG disintegrating tablet, Take 1 tablet (8 mg total) by mouth every 8 (eight) hours as needed for nausea or vomiting., Disp: 20 tablet, Rfl: 0   pantoprazole  (PROTONIX ) 40 MG tablet, TAKE 1 TABLET BY MOUTH TWICE A DAY, Disp: 180 tablet, Rfl: 3   prochlorperazine  (COMPAZINE ) 10 MG tablet, Take 1 tablet (10 mg total) by mouth 2 (two) times daily as needed for nausea or vomiting (Nausea )., Disp: 20 tablet, Rfl: 0   lamoTRIgine  (LAMICTAL ) 25 MG tablet, Take 1 tablet (25 mg total) by mouth 2 (two) times daily for 5 days. Please take in addition to the 100mg  lamictal  dose for a total dose of 125mg  twice a day, Disp: 10 tablet, Rfl: 0   linaclotide  (LINZESS ) 290 MCG CAPS capsule, TAKE 1 CAPSULE BY MOUTH DAILY. 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/08/2019   Intractable 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 without status migrainosus 04/06/2019   Hypertension    Chronic neck pain    FH: colon polyps 11/15/2016   Other dysphagia 11/15/2016   Other constipation 11/15/2016   PTSD (post-traumatic stress disorder) 05/18/2016   Breast mass, left 01/08/2016   Severe major depressive disorder (HCC) 01/08/2016   Chronic pain 01/08/2016   GERD (gastroesophageal reflux disease) 01/08/2016   Difficulty sleeping 08/05/2015    Past Surgical History:  Procedure Laterality Date   abd pain     APPENDECTOMY     BACK SURGERY     LUMBAR FUSIONS X 2   CATARACT  EXTRACTION W/PHACO Right 01/07/2015   Procedure: CATARACT EXTRACTION PHACO AND INTRAOCULAR LENS PLACEMENT (IOC);  Surgeon: Clair Crews, MD;  Location: [facility] ORS;  Service: Ophthalmology;  Laterality: Right;  US  0 AP% 0 CDE 0 fluid pack lot #[id number]   CATARACT EXTRACTION W/PHACO Left 01/28/2015   Procedure: CATARACT EXTRACTION PHACO AND INTRAOCULAR LENS PLACEMENT (IOC);  Surgeon: Clair Crews, MD;  Location: [facility] OR[facility];  Service: Ophthalmology;  Laterality: Left;  US  00:18 AP%7.1 CDE1.32 fluid pack lot # [id number]   Cerebral Catheterization for Brain Bleed  02/02/2019   [facility]   COLON SURGERY     COLONOSCOPY WITH PROPOFOL  N/A 05/07/2020   Procedure: COLONOSCOPY WITH PROPOFOL ;  Surgeon: Selena Daily, MD;  Location: [facility] ENDOSCOPY;  Service: Gastroenterology;  Laterality: N/A;   COLONOSCOPY WITH PROPOFOL  N/A 06/13/2020   Procedure: COLONOSCOPY WITH PROPOFOL ;  Surgeon: Marnee Sink, MD;  Location: [facility] ENDOSCOPY;  Service: Endoscopy;  Laterality: N/A;   ESOPHAGOGASTRODUODENOSCOPY (EGD) WITH PROPOFOL  N/A 05/07/2020   Procedure: ESOPHAGOGASTRODUODENOSCOPY (EGD) WITH PROPOFOL ;  Surgeon: Selena Daily, MD;  Location: [facility] ENDOSCOPY;  Service: Gastroenterology;  Laterality: N/A;   ESOPHAGOGASTRODUODENOSCOPY (EGD) WITH PROPOFOL  N/A 06/13/2020   Procedure: ESOPHAGOGASTRODUODENOSCOPY (EGD) WITH PROPOFOL ;  Surgeon: Marnee Sink, MD;  Location: [facility] ENDOSCOPY;  Service: Endoscopy;  Laterality: N/A;   ESOPHAGOGASTRODUODENOSCOPY (EGD) WITH PROPOFOL  N/A 06/23/2022   Procedure: ESOPHAGOGASTRODUODENOSCOPY (EGD) WITH PROPOFOL ;  Surgeon: Luke Salaam, MD;  Location: [facility] ENDOSCOPY;  Service: Gastroenterology;  Laterality: N/A;   EYE SURGERY     LEEP     XI ROBOT ABDOMINAL PERINEAL RESECTION N/A 01/24/2019   Procedure: Robot assisted appendectomy ;  Surgeon: Flynn Hylan, MD;  Location: [facility] ORS;  Service: General;  Laterality: N/A;    Family History  Problem Relation Age of Onset    Depression Mother    Heart disease Father    Hypertension Father    Diabetes Father    Post-traumatic stress disorder Sister    Hypertension Sister    Ovarian cancer Maternal Aunt        71's   Diabetes Brother    Depression Brother    Hypertension Brother    Breast cancer Neg Hx     Social History   Tobacco Use   Smoking status: Every Day    Current packs/day: 1.00    Types: Cigarettes   Smokeless tobacco: Never  Vaping Use   Vaping status: Every Day   Substances: CBD  Substance Use Topics   Alcohol use: No   Drug use: Yes    Types: Marijuana    Comment: 06/22/2022     Allergies  Allergen Reactions   Contrast Media [Iodinated Contrast Media]    Diphenhydramine Hcl Hives and Itching   Adhesive [  Tape]     PAPER TAPE OK   Iodine  Other (See Comments)   Prednisone  Anxiety   Sulfa Antibiotics Rash    Health Maintenance  Topic Date Due   Pneumococcal Vaccine 46-71 Years old (1 of 2 - PCV) Never done   MAMMOGRAM  05/11/2024   Lung Cancer Screening  05/26/2024   Medicare Annual Wellness (AWV)  06/06/2024   Cervical Cancer Screening (HPV/Pap Cotest)  09/08/2025   Colonoscopy  06/14/2027   DTaP/Tdap/Td (2 - Td or Tdap) 09/06/2027   Hepatitis C Screening  Completed   HIV Screening  Completed   HPV VACCINES  Aged Out   Meningococcal B Vaccine  Aged Out   INFLUENZA VACCINE  Discontinued   COVID-19 Vaccine  Discontinued   Zoster Vaccines- Shingrix  Discontinued    Chart Review Today: I personally reviewed active problem list, medication list, allergies, family history, social history, health maintenance, notes from last encounter, lab results, imaging with the patient/caregiver today.   Review of Systems  Constitutional: Negative.   HENT: Negative.    Eyes: Negative.   Respiratory: Negative.    Cardiovascular: Negative.   Gastrointestinal: Negative.   Endocrine: Negative.   Genitourinary: Negative.   Musculoskeletal: Negative.   Skin: Negative.    Allergic/Immunologic: Negative.   Neurological: Negative.   Hematological: Negative.   Psychiatric/Behavioral: Negative.    All other systems reviewed and are negative.    Objective:   Vitals:   07/06/23 1002  BP: 132/78  Pulse: 82  Resp: 16  Temp: 98.1 F (36.7 C)  TempSrc: Oral  SpO2: 98%  Weight: 115 lb 9.6 oz (52.4 kg)  Height: 5\' 7"  (1.702 m)    Body mass index is 18.11 kg/m.  Physical Exam Vitals and nursing note reviewed.  Constitutional:      General: She is not in acute distress.    Appearance: Normal appearance. She is well-developed. She is not ill-appearing, toxic-appearing or diaphoretic.     Comments: Looks healthier than last OV, thin, but not cachectic   HENT:     Head: Normocephalic and atraumatic.     Nose: Nose normal.  Eyes:     General:        Right eye: No discharge.        Left eye: No discharge.     Conjunctiva/sclera: Conjunctivae normal.  Neck:     Trachea: No tracheal deviation.  Cardiovascular:     Rate and Rhythm: Normal rate and regular rhythm.     Pulses: Normal pulses.     Heart sounds: Normal heart sounds.  Pulmonary:     Effort: Pulmonary effort is normal. No respiratory distress.     Breath sounds: Normal breath sounds. No stridor.  Abdominal:     General: Bowel sounds are normal.     Palpations: Abdomen is soft.  Skin:    General: Skin is warm and dry.     Findings: No rash.  Neurological:     Mental Status: She is alert.     Motor: No abnormal muscle tone.     Coordination: Coordination normal.  Psychiatric:        Mood and Affect: Mood and affect normal.        Behavior: Behavior is hyperactive.        Results for orders placed or performed during the hospital encounter of 05/23/23  CBC with Differential   Collection Time: 05/23/23  6:35 PM  Result Value Ref Range   WBC 6.6 4.0 -  10.5 K/uL   RBC 5.35 (H) 3.87 - 5.11 MIL/uL   Hemoglobin 16.4 (H) 12.0 - 15.0 g/dL   HCT 40.9 (H) 81.1 - 91.4 %   MCV 87.3 80.0  - 100.0 fL   MCH 30.7 26.0 - 34.0 pg   MCHC 35.1 30.0 - 36.0 g/dL   RDW 78.2 95.6 - 21.3 %   Platelets 295 150 - 400 K/uL   nRBC 0.0 0.0 - 0.2 %   Neutrophils Relative % 41 %   Neutro Abs 2.7 1.7 - 7.7 K/uL   Lymphocytes Relative 49 %   Lymphs Abs 3.1 0.7 - 4.0 K/uL   Monocytes Relative 10 %   Monocytes Absolute 0.7 0.1 - 1.0 K/uL   Eosinophils Relative 0 %   Eosinophils Absolute 0.0 0.0 - 0.5 K/uL   Basophils Relative 0 %   Basophils Absolute 0.0 0.0 - 0.1 K/uL   Immature Granulocytes 0 %   Abs Immature Granulocytes 0.01 0.00 - 0.07 K/uL  Comprehensive metabolic panel   Collection Time: 05/23/23  6:35 PM  Result Value Ref Range   Sodium 135 135 - 145 mmol/L   Potassium 3.0 (L) 3.5 - 5.1 mmol/L   Chloride 103 98 - 111 mmol/L   CO2 19 (L) 22 - 32 mmol/L   Glucose, Bld 113 (H) 70 - 99 mg/dL   BUN 12 6 - 20 mg/dL   Creatinine, Ser 0.86 0.44 - 1.00 mg/dL   Calcium 9.8 8.9 - 57.8 mg/dL   Total Protein 7.9 6.5 - 8.1 g/dL   Albumin 4.4 3.5 - 5.0 g/dL   AST 26 15 - 41 U/L   ALT 19 0 - 44 U/L   Alkaline Phosphatase 97 38 - 126 U/L   Total Bilirubin 0.3 0.0 - 1.2 mg/dL   GFR, Estimated >46 >96 mL/min   Anion gap 13 5 - 15  Resp panel by RT-PCR (RSV, Flu A&B, Covid) Anterior Nasal Swab   Collection Time: 05/23/23  6:38 PM   Specimen: Anterior Nasal Swab  Result Value Ref Range   SARS Coronavirus 2 by RT PCR NEGATIVE NEGATIVE   Influenza A by PCR POSITIVE (A) NEGATIVE   Influenza B by PCR NEGATIVE NEGATIVE   Resp Syncytial Virus by PCR NEGATIVE NEGATIVE      Assessment & Plan:     ICD-10-CM   1. Unintentional weight loss of less than 10% body weight within 6 months  R63.4    she has been able to make herself eat more and gained back some weight    2. Malnutrition, unspecified type (HCC)  E46    improving    3. Nausea, vomiting and diarrhea  R11.2    R19.7    improving, compazine  is helpful, did refill, she has GI f/up next month    4. Generalized abdominal pain   R10.84    improving abd pain    5. 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 Traumatic brain injury with loss of consciousness, sequela (HCC) Chronic S06.9X9S    continued chronic sx and pain managed by pain and neurology        Return in about 3 months (around 10/06/2023) for Routine  follow-up.   Adeline Hone, PA-C 07/06/23 10:16 AM

## 2023-07-13 ENCOUNTER — Other Ambulatory Visit: Payer: Self-pay | Admitting: Family Medicine

## 2023-07-13 DIAGNOSIS — E782 Mixed hyperlipidemia: Secondary | ICD-10-CM

## 2023-07-15 NOTE — Telephone Encounter (Signed)
 Rx 07/05/23 8.5 each- duplicate request Requested Prescriptions  Pending Prescriptions Disp Refills   albuterol  (VENTOLIN  HFA) 108 (90 Base) MCG/ACT inhaler [Pharmacy Med Name: ALBUTEROL  HFA (PROAIR ) INHALER] 8.5 each 0    Sig: INHALE 2 PUFFS BY MOUTH EVERY 4 TO 6 HOURS AS NEEDED     Pulmonology:  Beta Agonists 2 Failed - 07/15/2023 10:22 AM      Failed - Valid encounter within last 12 months    Recent Outpatient Visits           1 week ago Unintentional weight loss of less than 10% body weight within 6 months   [facility] Adeline Hone, PA-C   1 month ago Other constipation   [facility] [place], Jennie Moeller, PA-C   1 month ago Nausea, vomiting and diarrhea   [facility], Jennie Moeller, [place]              Passed - Last BP in normal range    BP Readings from Last 1 Encounters:  07/06/23 132/78         Passed - Last Heart Rate in normal range    Pulse Readings from Last 1 Encounters:  07/06/23 82

## 2023-08-01 ENCOUNTER — Other Ambulatory Visit: Payer: Self-pay | Admitting: Family Medicine

## 2023-08-01 DIAGNOSIS — I10 Essential (primary) hypertension: Secondary | ICD-10-CM

## 2023-08-01 DIAGNOSIS — K5909 Other constipation: Secondary | ICD-10-CM

## 2023-08-02 NOTE — Telephone Encounter (Signed)
 LOV 05/31/2023  Zestoretic  20-12.5 mg was discontinued 05/24/2023 so it was refused.  Requested Prescriptions  Pending Prescriptions Disp Refills   LINZESS  145 MCG CAPS capsule [Pharmacy Med Name: LINZESS  145 MCG CAPSULE] 30 capsule 0    Sig: TAKE 1 CAPSULE BY MOUTH DAILY BEFORE BREAKFAST.     Gastroenterology: Irritable Bowel Syndrome Failed - 08/02/2023 10:34 AM      Failed - Valid encounter within last 12 months    Recent Outpatient Visits           3 weeks ago Unintentional weight loss of less than 10% body weight within 6 months   [facility] Adeline Hone, PA-C   2 months ago Other constipation   [facility] Adeline Hone, PA-C   2 months ago Nausea, vomiting and diarrhea   [facility] Adeline Hone, PA-C               lisinopril -hydrochlorothiazide  (ZESTORETIC ) 20-12.5 MG tablet [Pharmacy Med Name: LISINOPRIL -HCTZ 20-12.5 MG TAB] 90 tablet 1    Sig: TAKE 1 TABLET BY MOUTH EVERY DAY     Cardiovascular:  ACEI + Diuretic Combos Failed - 08/02/2023 10:34 AM      Failed - K in normal range and within 180 days    Potassium  Date Value Ref Range Status  05/23/2023 3.0 (L) 3.5 - 5.1 mmol/L Final  12/17/2013 4.3 3.5 - 5.1 mmol/L Final         Failed - Valid encounter within last 6 months    Recent Outpatient Visits           3 weeks ago Unintentional weight loss of less than 10% body weight within 6 months   [facility] Adeline Hone, PA-C   2 months ago Other constipation   [facility] Adeline Hone, PA-C   2 months ago Nausea, vomiting and diarrhea   [facility] [place], Jennie Moeller, [place]              Passed - Na in normal range and within 180 days    Sodium  Date Value Ref Range Status  05/23/2023 135 135 - 145 mmol/L Final  03/20/2020 143 134 - 144 mmol/L Final  12/17/2013 138 136 - 145 mmol/L Final          Passed - Cr in normal range and within 180 days    Creat  Date Value Ref Range Status  11/08/2022 0.64 0.50 - 1.03 mg/dL Final   Creatinine, Ser  Date Value Ref Range Status  05/23/2023 0.64 0.44 - 1.00 mg/dL Final         Passed - eGFR is 30 or above and within 180 days    GFR, Est African American  Date Value Ref Range Status  01/17/2020 98 > OR = 60 mL/min/1.45m2 Final   GFR calc Af Amer  Date Value Ref Range Status  03/20/2020 79 >59 mL/min/1.73 Final    Comment:    **In accordance with recommendations from the NKF-ASN Task force,**   [facility] is in the process of updating its eGFR calculation to the   2021 CKD-EPI creatinine equation that estimates kidney function   without a race variable.    GFR, Est Non African American  Date Value Ref Range Status  01/17/2020 85 > OR = 60 mL/min/1.64m2 Final   GFR, Estimated  Date Value Ref Range Status  05/23/2023 >60 >60 mL/min Final  Comment:    (NOTE) Calculated using the CKD-EPI Creatinine Equation (2021)    eGFR  Date Value Ref Range Status  11/08/2022 104 > OR = 60 mL/min/1.67m2 Final         Passed - Patient is not pregnant      Passed - Last BP in normal range    BP Readings from Last 1 Encounters:  07/06/23 132/78

## 2023-08-10 ENCOUNTER — Other Ambulatory Visit: Payer: Self-pay | Admitting: Family Medicine

## 2023-08-10 DIAGNOSIS — E782 Mixed hyperlipidemia: Secondary | ICD-10-CM

## 2023-08-12 NOTE — Telephone Encounter (Signed)
 Requested Prescriptions  Pending Prescriptions Disp Refills   albuterol  (VENTOLIN  HFA) 108 (90 Base) MCG/ACT inhaler [Pharmacy Med Name: ALBUTEROL  HFA (PROAIR ) INHALER] 8.5 each 0    Sig: INHALE 2 PUFFS BY MOUTH EVERY 4 TO 6 HOURS AS NEEDED     Pulmonology:  Beta Agonists 2 Failed - 08/12/2023 12:11 PM      Failed - Valid encounter within last 12 months    Recent Outpatient Visits           1 month ago Unintentional weight loss of less than 10% body weight within 6 months   [facility] Adeline Hone, PA-C   2 months ago Other constipation   [facility] Adeline Hone, PA-C   2 months ago Nausea, vomiting and diarrhea   [facility] [place], Jennie Moeller, [place]              Passed - Last BP in normal range    BP Readings from Last 1 Encounters:  07/06/23 132/78         Passed - Last Heart Rate in normal range    Pulse Readings from Last 1 Encounters:  07/06/23 82

## 2023-09-16 ENCOUNTER — Ambulatory Visit: Admission: EM | Admit: 2023-09-16 | Discharge: 2023-09-16 | Disposition: A | Discharge status: Home / Self Care

## 2023-09-16 ENCOUNTER — Ambulatory Visit (INDEPENDENT_AMBULATORY_CARE_PROVIDER_SITE_OTHER)

## 2023-09-16 DIAGNOSIS — S93402A Sprain of unspecified ligament of left ankle, initial encounter: Secondary | ICD-10-CM | POA: Diagnosis not present

## 2023-09-16 DIAGNOSIS — S8012XA Contusion of left lower leg, initial encounter: Secondary | ICD-10-CM | POA: Diagnosis not present

## 2023-09-16 DIAGNOSIS — S90512A Abrasion, left ankle, initial encounter: Secondary | ICD-10-CM

## 2023-09-16 DIAGNOSIS — M79672 Pain in left foot: Secondary | ICD-10-CM

## 2023-09-16 NOTE — ED Provider Notes (Signed)
 [facility]    CSN: [id number] Arrival date & time: 09/16/23  1753      History   Chief Complaint Chief Complaint  Patient presents with   Foot Injury    HPI AQUA DENSLOW is a 58 y.o. female presenting for left foot/ankle and shin pain after dropping pallet on foot this morning. She has been able to walk and bear weight. Pain has worsened throughout the day. Has abrasions of ankle which she has cleaned with hydrogen peroxide and applied topical triple antibiotic ointment. Patient reports pain with trying to move ankle and wiggle toes. No weakness or numbness.  Patient has been taking home oxycodone  prescription without relief. No previous foot fractures. No other concerns or injuries. Unknown last tetanus immunization and declines tetanus immunization today.   HPI  Past Medical History:  Diagnosis Date   BRBPR (bright red blood per rectum)    Breast mass, left    Cancer (HCC)    CERVICAL   Crushing injury of neck 09/27/2011   Depression    Falls    EASILY   GERD (gastroesophageal reflux disease)    Head injury 05/06/2011   History of subarachnoid hemorrhage 05/07/2019   Formatting of this note might be different from the original. December 2020   PTSD (post-traumatic stress disorder)    PTSD (post-traumatic stress disorder)    S/P laparoscopic appendectomy 02/15/2019   01/24/2019, for distal SBO, and nl appendix.    SAH (subarachnoid hemorrhage) (HCC) 04/06/2019   SBO (small bowel obstruction) (HCC) 05/01/2020   Seizures (HCC)    Shoulder disorder    LIMITED USE OF LEFT SHOULDER AND ARM   TBI (traumatic brain injury) (HCC)    SKULL FX 2009   Vertigo    Vertigo     Patient Active Problem List   Diagnosis Date Noted   Unintentional weight loss of less than 10% body weight within 6 months 11/08/2022   Melena 06/23/2022   Mixed hyperlipidemia 12/09/2021   Polyp of transverse colon    Gastric outlet obstruction 03/20/2020   Chronic obstructive pulmonary disease  (HCC) 06/14/2019   Tobacco use 06/14/2019   Traumatic brain injury with loss of consciousness (HCC) 05/08/2019   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/08/2019   Intractable 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 without status migrainosus 04/06/2019   Hypertension    Chronic neck pain    FH: colon polyps 11/15/2016   Other dysphagia 11/15/2016   Other constipation 11/15/2016   PTSD (post-traumatic stress disorder) 05/18/2016   Breast mass, left 01/08/2016   Severe major depressive disorder (HCC) 01/08/2016   Chronic pain 01/08/2016   GERD (gastroesophageal reflux disease) 01/08/2016   Difficulty sleeping 08/05/2015    Past Surgical History:  Procedure Laterality Date   abd pain     APPENDECTOMY     BACK SURGERY     LUMBAR FUSIONS X 2   CATARACT EXTRACTION W/PHACO Right 01/07/2015   Procedure: CATARACT EXTRACTION PHACO AND INTRAOCULAR LENS PLACEMENT (IOC);  Surgeon: Elsie Carmine, MD;  Location: [facility];  Service: Ophthalmology;  Laterality: Right;  US  0 AP% 0 CDE 0 fluid pack lot #[id number]   CATARACT EXTRACTION W/PHACO Left 01/28/2015   Procedure: CATARACT EXTRACTION PHACO AND INTRAOCULAR LENS PLACEMENT (IOC);  Surgeon: Elsie Carmine, MD;  Location: [facility];  Service: Ophthalmology;  Laterality: Left;  US  00:18 AP%7.1 CDE1.32 fluid pack lot # [id number]   Cerebral Catheterization for Brain Bleed  02/02/2019   [facility]   COLON SURGERY     COLONOSCOPY WITH PROPOFOL  N/A 05/07/2020   Procedure: COLONOSCOPY WITH PROPOFOL ;  Surgeon: Unk Corinn Skiff, MD;  Location: [facility] END[facility]COPY;  Service: Gastroenterology;  Laterality: N/A;   COLONOSCOPY WITH PROPOFOL  N/A 06/13/2020   Procedure: COLONOSCOPY WITH PROPOFOL ;  Surgeon: Jinny Carmine, MD;  Location: [facility] ENDOSCOPY;  Service: Endoscopy;  Laterality: N/A;   ESOPHAGOGASTRODUODENOSCOPY (EGD) WITH PROPOFOL  N/A 05/07/2020   Procedure: ESOPHAGOGASTRODUODENOSCOPY (EGD) WITH PROPOFOL ;  Surgeon: Unk Corinn Skiff, MD;  Location: [facility] ENDOSCOPY;  Service: Gastroenterology;  Laterality: N/A;   ESOPHAGOGASTRODUODENOSCOPY (EGD) WITH PROPOFOL  N/A 06/13/2020   Procedure: ESOPHAGOGASTRODUODENOSCOPY (EGD) WITH PROPOFOL ;  Surgeon: Jinny Carmine, MD;  Location: [facility] ENDOSCOPY;  Service: Endoscopy;  Laterality: N/A;   ESOPHAGOGASTRODUODENOSCOPY (EGD) WITH PROPOFOL  N/A 06/23/2022   Procedure: ESOPHAGOGASTRODUODENOSCOPY (EGD) WITH PROPOFOL ;  Surgeon: Therisa Bi, MD;  Location: [facility] ENDOSCOPY;  Service: Gastroenterology;  Laterality: N/A;   EYE SURGERY     LEEP     XI ROBOT ABDOMINAL PERINEAL RESECTION N/A 01/24/2019   Procedure: Robot assisted appendectomy ;  Surgeon: Lane Shope, MD;  Location: [facility] ORS;  Service: General;  Laterality: N/A;    OB History   No obstetric history on file.      Home Medications    Prior to Admission medications   Medication Sig Start Date End Date Taking? Authorizing Provider  albuterol  (VENTOLIN  HFA) 108 (90 Base) MCG/ACT inhaler INHALE 2 PUFFS BY MOUTH EVERY 4 TO 6 HOURS AS NEEDED 08/12/23  Yes Tapia, Leisa, PA-C  carisoprodol  (SOMA ) 350 MG tablet Take 700 mg by mouth 2 (two) times daily.   Yes [provider]  desvenlafaxine  (PRISTIQ ) 100 MG 24 hr tablet Take 1 tablet (100 mg total) by mouth daily. 07/06/23  Yes Tapia, Leisa, PA-C  lamoTRIgine  (LAMICTAL ) 100 MG tablet Take 1 tablet by mouth 2 (two) times daily. 05/18/23  Yes [provider]  linaclotide  (LINZESS ) 290 MCG CAPS capsule TAKE 1 CAPSULE BY MOUTH DAILY. 12/30/22  Yes Therisa Bi, MD  LINZESS  145 MCG CAPS capsule TAKE 1 CAPSULE BY MOUTH DAILY BEFORE BREAKFAST. 08/02/23  Yes Tapia, Leisa, PA-C  oxyCODONE  ER 9 MG C12A Take 9 mg by mouth.   Yes [provider]  pantoprazole  (PROTONIX ) 40 MG tablet TAKE 1 TABLET BY MOUTH TWICE A DAY 05/17/23  Yes Therisa Bi, MD  prochlorperazine  (COMPAZINE ) 10 MG tablet Take 1 tablet (10 mg total) by mouth 2 (two) times daily as needed for nausea or  vomiting (Nausea ). 07/06/23  Yes Tapia, Leisa, PA-C  lamoTRIgine  (LAMICTAL ) 25 MG tablet Take 1 tablet (25 mg total) by mouth 2 (two) times daily for 5 days. Please take in addition to the 100mg  lamictal  dose for a total dose of 125mg  twice a day 05/23/23 05/28/23  Bradler, Evan K, MD  nystatin  (MYCOSTATIN ) 100000 UNIT/ML suspension Take 5 mLs (500,000 Units total) by mouth 4 (four) times daily. 05/24/23   Sung, Jade J, MD  ondansetron  (ZOFRAN -ODT) 8 MG disintegrating tablet Take 1 tablet (8 mg total) by mouth every 8 (eight) hours as needed for nausea or vomiting. 05/23/23   Jossie Artist POUR, MD    Family History Family History  Problem Relation Age of Onset   Depression Mother    Heart disease Father    Hypertension Father    Diabetes Father    Post-traumatic stress disorder Sister    Hypertension Sister    Ovarian cancer Maternal Aunt        [phone]  Diabetes Brother    Depression Brother    Hypertension Brother    Breast cancer Neg Hx     Social History Social History   Tobacco Use   Smoking status: Every Day    Current packs/day: 1.00    Types: Cigarettes   Smokeless tobacco: Never  Vaping Use   Vaping status: Every Day   Substances: CBD  Substance Use Topics   Alcohol use: No   Drug use: Yes    Types: Marijuana    Comment: 06/22/2022     Allergies   Contrast media [iodinated contrast media], Diphenhydramine hcl, Adhesive [tape], Iodine , Prednisone , and Sulfa antibiotics   Review of Systems Review of Systems  Musculoskeletal:  Positive for arthralgias, gait problem and joint swelling.  Skin:  Positive for color change and wound.  Neurological:  Negative for weakness and numbness.     Physical Exam Triage Vital Signs ED Triage Vitals  Encounter Vitals Group     BP      Girls Systolic BP Percentile      Girls Diastolic BP Percentile      Boys Systolic BP Percentile      Boys Diastolic BP Percentile      Pulse      Resp      Temp      Temp src      SpO2       Weight      Height      Head Circumference      Peak Flow      Pain Score      Pain Loc      Pain Education      Exclude from Growth Chart    No data found.  Updated Vital Signs BP 97/72 (BP Location: Left Arm)   Pulse 80   Temp 99.1 F (37.3 C) (Oral)   Resp (!) 24   Ht 5' 7 (1.702 m)   Wt 122 lb (55.3 kg)   SpO2 100%   BMI 19.11 kg/m     Physical Exam Vitals and nursing note reviewed.  Constitutional:      General: She is not in acute distress.    Appearance: Normal appearance. She is not ill-appearing or toxic-appearing.  HENT:     Head: Normocephalic and atraumatic.  Eyes:     General: No scleral icterus.       Right eye: No discharge.        Left eye: No discharge.     Conjunctiva/sclera: Conjunctivae normal.  Cardiovascular:     Rate and Rhythm: Normal rate.     Pulses: Normal pulses.  Pulmonary:     Effort: Pulmonary effort is normal. No respiratory distress.  Musculoskeletal:     Cervical back: Neck supple.     Comments: LEFT FOOT/ANKLE/LEG: Multiple abrasions of anterior ankle/foot. Contusion with associated swelling and tenderness mid tib fib. TTP diffusely throughout dorsal midfoot and medial ankle. Good pulses.   Skin:    General: Skin is dry.  Neurological:     General: No focal deficit present.     Mental Status: She is alert. Mental status is at baseline.     Motor: No weakness.     Gait: Gait abnormal.  Psychiatric:        Mood and Affect: Mood normal.        Behavior: Behavior normal.      UC Treatments / Results  Labs (all labs ordered are listed, but only abnormal results are  displayed) Labs Reviewed - No data to display  EKG   Radiology DG Tibia/Fibula Left Result Date: 09/16/2023 EXAM: 2 VIEW(S) XRAY OF THE LEFT TIBIA AND FIBULA 09/16/2023 06:49:00 PM COMPARISON: None available. CLINICAL HISTORY: Foot, ankle, shin pain after dropping pallet on foot today. Abrasion of ankle/foot. Pt c/o injury to left foot. Pt states that she  was outside moving a pallet and the pallet fell and landed on her left foot. Pt states that she can not wiggle her toes on her left foot. Pt states that the pain is from her foot to her knee. Pt declines a tetanus. Pt has taken Xztampza and still feels pain in her foot. FINDINGS: BONES AND JOINTS: No acute fracture. No focal osseous lesion. No joint dislocation. SOFT TISSUES: The soft tissues are unremarkable. IMPRESSION: 1. Negative. Electronically signed by: Pinkie Pebbles MD 09/16/2023 07:16 PM EDT RP Workstation: [id number]   DG Foot Complete Left Result Date: 09/16/2023 EXAM: 3 or more VIEW(S) XRAY OF THE LEFT FOOT 09/16/2023 06:49:00 PM COMPARISON: None available. CLINICAL HISTORY: Foot, ankle, shin pain after dropping pallet on foot today. Abrasion of ankle/foot. Pt c/o injury to left foot. Pt states that she was outside moving a pallet and the pallet fell and landed on her left foot. Pt states that she can not wiggle her toes on her left foot. Pt states that the pain is from her foot to her knee. Pt declines a tetanus. Pt has taken Xztampza and still feels pain in her foot. FINDINGS: BONES AND JOINTS: No acute fracture. No focal osseous lesion. No joint dislocation. SOFT TISSUES: The soft tissues are unremarkable. IMPRESSION: 1. Negative. Electronically signed by: Pinkie Pebbles MD 09/16/2023 07:16 PM EDT RP Workstation: [id number]    Procedures Procedures (including critical care time)  Medications Ordered in UC Medications - No data to display  Initial Impression / Assessment and Plan / UC Course  I have reviewed the triage vital signs and the nursing notes.  Pertinent labs & imaging results that were available during my care of the patient were reviewed by me and considered in my medical decision making (see chart for details).   58 year old female presents for left foot and ankle pain as well as shin pain and contusion following dropping of the pallet on her foot today.  Also has  abrasions of ankle and foot.  Last tetanus immunization unknown and patient declines tetanus immunization today.  She says I have history of traumatic brain injury and they give me one every time I have to go to the doctor.  X-rays of foot and tib-fib obtained today to assess for underlying fracture. All negative.   Reviewed results with patient.  Offered ankle brace to patient but she says she would like something more supportive and requested a cam boot.  This was given to patient.  Patient advised on following RICE guidelines.  Take anti-inflammatory medication and advised use of OTC oxycodone  as needed for severe pain.  Discussed wound care guidelines.  Reviewed return precautions.   Final Clinical Impressions(s) / UC Diagnoses   Final diagnoses:  Left foot pain  Contusion of left lower leg, initial encounter  Abrasion, left ankle, initial encounter  Sprain of left ankle, unspecified ligament, initial encounter     Discharge Instructions      -X-rays negative for fracture -Wear boot as needed -Ice and elevate foot to help with swelling -Take anti inflammatory meds and home pain meds as needed -Clean wounds with soap and water daily. Change  bandage daily -Return if signs of infection (redness, swelling, pustular drainage)     ED Prescriptions   None    I have reviewed the PDMP during this encounter.   Arvis Jolan NOVAK, PA-C 09/16/23 1945

## 2023-09-16 NOTE — Discharge Instructions (Addendum)
-  X-rays negative for fracture -Wear boot as needed -Ice and elevate foot to help with swelling -Take anti inflammatory meds and home pain meds as needed -Clean wounds with soap and water daily. Change bandage daily -Return if signs of infection (redness, swelling, pustular drainage)

## 2023-09-16 NOTE — ED Triage Notes (Signed)
 Pt c/o injury to left foot  Pt states that she was outside moving a pallet and the pallet fell and landed on her left foot.  Pt states that she can not wiggle her toes on her left foot.   Pt states that the pain is from her foot to her knee.   Pt declines a tetanus  Pt has taken Xztampza and still feels pain in her foot.

## 2023-09-20 ENCOUNTER — Other Ambulatory Visit: Payer: Self-pay | Admitting: Family Medicine

## 2023-09-20 DIAGNOSIS — E782 Mixed hyperlipidemia: Secondary | ICD-10-CM

## 2023-09-22 NOTE — Telephone Encounter (Signed)
 Requested Prescriptions  Pending Prescriptions Disp Refills   albuterol  (VENTOLIN  HFA) 108 (90 Base) MCG/ACT inhaler [Pharmacy Med Name: ALBUTEROL  HFA (PROAIR ) INHALER] 8.5 each 0    Sig: INHALE 2 PUFFS BY MOUTH EVERY 4 TO 6 HOURS AS NEEDED     Pulmonology:  Beta Agonists 2 Passed - 09/22/2023  8:43 AM      Passed - Last BP in normal range    BP Readings from Last 1 Encounters:  09/16/23 97/72         Passed - Last Heart Rate in normal range    Pulse Readings from Last 1 Encounters:  09/16/23 80         Passed - Valid encounter within last 12 months    Recent Outpatient Visits           2 months ago Unintentional weight loss of less than 10% body weight within 6 months   [facility] Leavy Mole, PA-C   3 months ago Other constipation   [facility] Leavy Mole, PA-C   4 months ago Nausea, vomiting and diarrhea   [facility] Leavy Mole, PA-C

## 2023-10-07 ENCOUNTER — Encounter: Payer: Self-pay | Admitting: Family Medicine

## 2023-10-07 ENCOUNTER — Ambulatory Visit: Admitting: Family Medicine

## 2023-10-07 VITALS — BP 118/70 | HR 71 | Resp 16 | Ht 67.0 in | Wt 122.0 lb

## 2023-10-07 DIAGNOSIS — S069XAS Unspecified intracranial injury with loss of consciousness status unknown, sequela: Secondary | ICD-10-CM

## 2023-10-07 DIAGNOSIS — I10 Essential (primary) hypertension: Secondary | ICD-10-CM | POA: Diagnosis not present

## 2023-10-07 DIAGNOSIS — F0630 Mood disorder due to known physiological condition, unspecified: Secondary | ICD-10-CM

## 2023-10-07 DIAGNOSIS — E46 Unspecified protein-calorie malnutrition: Secondary | ICD-10-CM | POA: Diagnosis not present

## 2023-10-07 DIAGNOSIS — J449 Chronic obstructive pulmonary disease, unspecified: Secondary | ICD-10-CM | POA: Diagnosis not present

## 2023-10-07 DIAGNOSIS — M25572 Pain in left ankle and joints of left foot: Secondary | ICD-10-CM

## 2023-10-07 NOTE — Progress Notes (Signed)
 Name: [name]   MRN: [id number]    DOB: 1965/06/05   Date:10/07/2023       Progress Note  Chief Complaint  Patient presents with   Medical Management of Chronic Issues    3 month follow-up     Subjective:   [name] is a 58 y.o. female, presents to clinic for routine follow up on chronic conditions  Chronic abd pain, constipation/diarrhea hx of bowel obstruction slow gi transit, chronic opioid use seeing GI at [facility]  Pain management, hx TBI, chronic Has managed by pain management and neurology - now on XR oxy and soma   Unintentional weight loss - appetite has improved and she was able to gain weight back Wt Readings from Last 5 Encounters:  10/07/23 122 lb (55.3 kg)  09/16/23 122 lb (55.3 kg)  07/06/23 115 lb 9.6 oz (52.4 kg)  05/31/23 102 lb (46.3 kg)  01/12/23 119 lb 12.8 oz (54.3 kg)   BMI Readings from Last 5 Encounters:  10/07/23 19.11 kg/m  09/16/23 19.11 kg/m  07/06/23 18.11 kg/m  05/31/23 15.98 kg/m  05/24/23 18.49 kg/m   HTN still off BP meds since she had flu and lost weight/low BP BP at goal BP Readings from Last 3 Encounters:  10/07/23 118/70  09/16/23 97/72  07/06/23 132/78   Discussed the use of AI scribe software for clinical note transcription with the patient, who gave verbal consent to proceed.      Current Outpatient Medications:    albuterol  (VENTOLIN  HFA) 108 (90 Base) MCG/ACT inhaler, INHALE 2 PUFFS BY MOUTH EVERY 4 TO 6 HOURS AS NEEDED, Disp: 8.5 each, Rfl: 0   carisoprodol  (SOMA ) 350 MG tablet, Take 700 mg by mouth 2 (two) times daily., Disp: , Rfl:    desvenlafaxine  (PRISTIQ ) 100 MG 24 hr tablet, Take 1 tablet (100 mg total) by mouth daily., Disp: 90 tablet, Rfl: 2   lamoTRIgine  (LAMICTAL ) 100 MG tablet, Take 1 tablet by mouth in the morning, at noon, and at bedtime., Disp: , Rfl:    lamoTRIgine  (LAMICTAL ) 25 MG tablet, Take 1 tablet (25 mg total) by mouth 2 (two) times daily for 5 days. Please take in addition to the 100mg   lamictal  dose for a total dose of 125mg  twice a day, Disp: 10 tablet, Rfl: 0   linaclotide  (LINZESS ) 290 MCG CAPS capsule, TAKE 1 CAPSULE BY MOUTH DAILY., Disp: 30 capsule, Rfl: 11   LINZESS  145 MCG CAPS capsule, TAKE 1 CAPSULE BY MOUTH DAILY BEFORE BREAKFAST., Disp: 30 capsule, Rfl: 0   nystatin  (MYCOSTATIN ) 100000 UNIT/ML suspension, Take 5 mLs (500,000 Units total) by mouth 4 (four) times daily., Disp: 60 mL, Rfl: 0   ondansetron  (ZOFRAN -ODT) 8 MG disintegrating tablet, Take 1 tablet (8 mg total) by mouth every 8 (eight) hours as needed for nausea or vomiting., Disp: 20 tablet, Rfl: 0   oxyCODONE  ER 9 MG C12A, Take 9 mg by mouth., Disp: , Rfl:    pantoprazole  (PROTONIX ) 40 MG tablet, TAKE 1 TABLET BY MOUTH TWICE A DAY, Disp: 180 tablet, Rfl: 3   prochlorperazine  (COMPAZINE ) 10 MG tablet, Take 1 tablet (10 mg total) by mouth 2 (two) times daily as needed for nausea or vomiting (Nausea )., Disp: 20 tablet, Rfl: 1  Patient Active Problem List   Diagnosis Date Noted   Unintentional weight loss of less than 10% body weight within 6 months 11/08/2022   Melena 06/23/2022   Mixed hyperlipidemia 12/09/2021   Polyp of transverse colon  Gastric outlet obstruction 03/20/2020   Chronic obstructive pulmonary disease (HCC) 06/14/2019   Tobacco use 06/14/2019   Traumatic brain injury with loss of consciousness (HCC) 05/08/2019   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/08/2019   Intractable 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 without status migrainosus 04/06/2019   Hypertension    Chronic neck pain    FH: colon polyps 11/15/2016   Other dysphagia 11/15/2016   Other constipation 11/15/2016   PTSD (post-traumatic stress disorder) 05/18/2016   Breast mass, left 01/08/2016   Severe major depressive disorder (HCC) 01/08/2016   Chronic pain 01/08/2016   GERD (gastroesophageal reflux disease) 01/08/2016   Difficulty sleeping 08/05/2015    Past Surgical History:  Procedure  Laterality Date   abd pain     APPENDECTOMY     BACK SURGERY     LUMBAR FUSIONS X 2   CATARACT EXTRACTION W/PHACO Right 01/07/2015   Procedure: CATARACT EXTRACTION PHACO AND INTRAOCULAR LENS PLACEMENT (IOC);  Surgeon: Elsie Carmine, MD;  Location: [facility] ORS;  Service: Ophthalmology;  Laterality: Right;  US  0 AP% 0 CDE 0 fluid pack lot #[id number]   CATARACT EXTRACTION W/PHACO Left 01/28/2015   Procedure: CATARACT EXTRACTION PHACO AND INTRAOCULAR LENS PLACEMENT (IOC);  Surgeon: Elsie Carmine, MD;  Location: [facility] ORS;  Service: Ophthalmology;  Laterality: Left;  US  00:18 AP%7.1 CDE1.32 fluid pack lot # [id number]   Cerebral Catheterization for Brain Bleed  02/02/2019   [facility]   COLON SURGERY     COLONOSCOPY WITH PROPOFOL  N/A 05/07/2020   Procedure: COLONOSCOPY WITH PROPOFOL ;  Surgeon: Unk Corinn Skiff, MD;  Location: [facility] ENDOSCOPY;  Service: Gastroenterology;  Laterality: N/A;   COLONOSCOPY WITH PROPOFOL  N/A 06/13/2020   Procedure: COLONOSCOPY WITH PROPOFOL ;  Surgeon: Jinny Carmine, MD;  Location: [facility] ENDOSCOPY;  Service: Endoscopy;  Laterality: N/A;   ESOPHAGOGASTRODUODENOSCOPY (EGD) WITH PROPOFOL  N/A 05/07/2020   Procedure: ESOPHAGOGASTRODUODENOSCOPY (EGD) WITH PROPOFOL ;  Surgeon: Unk Corinn Skiff, MD;  Location: [facility] ENDOSCOPY;  Service: Gastroenterology;  Laterality: N/A;   ESOPHAGOGASTRODUODENOSCOPY (EGD) WITH PROPOFOL  N/A 06/13/2020   Procedure: ESOPHAGOGASTRODUODENOSCOPY (EGD) WITH PROPOFOL ;  Surgeon: Jinny Carmine, MD;  Location: [facility] ENDOSCOPY;  Service: Endoscopy;  Laterality: N/A;   ESOPHAGOGASTRODUODENOSCOPY (EGD) WITH PROPOFOL  N/A 06/23/2022   Procedure: ESOPHAGOGASTRODUODENOSCOPY (EGD) WITH PROPOFOL ;  Surgeon: Therisa Bi, MD;  Location: [facility] ENDOSCOPY;  Service: Gastroenterology;  Laterality: N/A;   EYE SURGERY     LEEP     XI ROBOT ABDOMINAL PERINEAL RESECTION N/A 01/24/2019   Procedure: Robot assisted appendectomy ;  Surgeon: Lane Shope, MD;  Location:  [facility] ORS;  Service: General;  Laterality: N/A;    Family History  Problem Relation Age of Onset   Depression Mother    Heart disease Father    Hypertension Father    Diabetes Father    Post-traumatic stress disorder Sister    Hypertension Sister    Ovarian cancer Maternal Aunt        98's   Diabetes Brother    Depression Brother    Hypertension Brother    Breast cancer Neg Hx     Social History   Tobacco Use   Smoking status: Every Day    Current packs/day: 1.00    Types: Cigarettes   Smokeless tobacco: Never  Vaping Use   Vaping status: Every Day   Substances: CBD  Substance Use Topics   Alcohol use: No   Drug use: Yes    Types: Marijuana    Comment: 06/22/2022  Allergies  Allergen Reactions   Contrast Media [Iodinated Contrast Media]    Diphenhydramine Hcl Hives and Itching   Adhesive [Tape]     PAPER TAPE OK   Iodine  Other (See Comments)   Prednisone  Anxiety   Sulfa Antibiotics Rash    Health Maintenance  Topic Date Due   Hepatitis B Vaccines (1 of 3 - 19+ 3-dose series) 10/05/2024 (Originally 04/13/1984)   MAMMOGRAM  05/11/2024   Lung Cancer Screening  05/26/2024   Medicare Annual Wellness (AWV)  06/06/2024   Cervical Cancer Screening (HPV/Pap Cotest)  09/08/2025   Colonoscopy  06/14/2027   DTaP/Tdap/Td (2 - Td or Tdap) 09/06/2027   Pneumococcal Vaccine: 19-49 Years  Completed   Pneumococcal Vaccine: 50+ Years  Completed   Hepatitis C Screening  Completed   HIV Screening  Completed   HPV VACCINES  Aged Out   Meningococcal B Vaccine  Aged Out   INFLUENZA VACCINE  Discontinued   COVID-19 Vaccine  Discontinued   Zoster Vaccines- Shingrix  Discontinued    Chart Review Today: I personally reviewed active problem list, medication list, allergies, family history, social history, health maintenance, notes from last encounter, lab results, imaging with the patient/caregiver today.   Review of Systems  Constitutional: Negative.   HENT: Negative.     Eyes: Negative.   Respiratory: Negative.    Cardiovascular: Negative.   Gastrointestinal: Negative.   Endocrine: Negative.   Genitourinary: Negative.   Musculoskeletal: Negative.   Skin: Negative.   Allergic/Immunologic: Negative.   Neurological: Negative.   Hematological: Negative.   Psychiatric/Behavioral: Negative.    All other systems reviewed and are negative.    Objective:   Vitals:   10/07/23 0819  BP: 118/70  Pulse: 71  Resp: 16  SpO2: 97%  Weight: 122 lb (55.3 kg)  Height: 5' 7 (1.702 m)    Body mass index is 19.11 kg/m.  Physical Exam Vitals and nursing note reviewed.  Constitutional:      General: She is not in acute distress.    Appearance: Normal appearance. She is well-developed. She is not ill-appearing, toxic-appearing or diaphoretic.  HENT:     Head: Normocephalic and atraumatic.     Right Ear: External ear normal.     Left Ear: External ear normal.     Nose: Nose normal.  Eyes:     General: No scleral icterus.       Right eye: No discharge.        Left eye: No discharge.     Conjunctiva/sclera: Conjunctivae normal.  Neck:     Trachea: No tracheal deviation.  Cardiovascular:     Rate and Rhythm: Normal rate and regular rhythm.     Pulses: Normal pulses.     Heart sounds: Normal heart sounds.  Pulmonary:     Effort: Pulmonary effort is normal. No respiratory distress.     Breath sounds: Normal breath sounds. No stridor.  Musculoskeletal:     Comments: Left foot in walking boot, swelling to ankle and foot w/o erythema or induration, two healing dry appearing wounds scabbed, no discharge no erythema  Skin:    General: Skin is warm and dry.     Findings: No rash.  Neurological:     Mental Status: She is alert.     Motor: No abnormal muscle tone.     Coordination: Coordination normal.     Gait: Gait abnormal.  Psychiatric:        Mood and Affect: Mood normal.  Behavior: Behavior normal.        Results for orders placed or  performed during the hospital encounter of 05/23/23  CBC with Differential   Collection Time: 05/23/23  6:35 PM  Result Value Ref Range   WBC 6.6 4.0 - 10.5 K/uL   RBC 5.35 (H) 3.87 - 5.11 MIL/uL   Hemoglobin 16.4 (H) 12.0 - 15.0 g/dL   HCT 53.2 (H) 63.9 - 53.9 %   MCV 87.3 80.0 - 100.0 fL   MCH 30.7 26.0 - 34.0 pg   MCHC 35.1 30.0 - 36.0 g/dL   RDW 86.5 88.4 - 84.4 %   Platelets 295 150 - 400 K/uL   nRBC 0.0 0.0 - 0.2 %   Neutrophils Relative % 41 %   Neutro Abs 2.7 1.7 - 7.7 K/uL   Lymphocytes Relative 49 %   Lymphs Abs 3.1 0.7 - 4.0 K/uL   Monocytes Relative 10 %   Monocytes Absolute 0.7 0.1 - 1.0 K/uL   Eosinophils Relative 0 %   Eosinophils Absolute 0.0 0.0 - 0.5 K/uL   Basophils Relative 0 %   Basophils Absolute 0.0 0.0 - 0.1 K/uL   Immature Granulocytes 0 %   Abs Immature Granulocytes 0.01 0.00 - 0.07 K/uL  Comprehensive metabolic panel   Collection Time: 05/23/23  6:35 PM  Result Value Ref Range   Sodium 135 135 - 145 mmol/L   Potassium 3.0 (L) 3.5 - 5.1 mmol/L   Chloride 103 98 - 111 mmol/L   CO2 19 (L) 22 - 32 mmol/L   Glucose, Bld 113 (H) 70 - 99 mg/dL   BUN 12 6 - 20 mg/dL   Creatinine, Ser 9.35 0.44 - 1.00 mg/dL   Calcium 9.8 8.9 - 89.6 mg/dL   Total Protein 7.9 6.5 - 8.1 g/dL   Albumin 4.4 3.5 - 5.0 g/dL   AST 26 15 - 41 U/L   ALT 19 0 - 44 U/L   Alkaline Phosphatase 97 38 - 126 U/L   Total Bilirubin 0.3 0.0 - 1.2 mg/dL   GFR, Estimated >39 >39 mL/min   Anion gap 13 5 - 15  Resp panel by RT-PCR (RSV, Flu A&B, Covid) Anterior Nasal Swab   Collection Time: 05/23/23  6:38 PM   Specimen: Anterior Nasal Swab  Result Value Ref Range   SARS Coronavirus 2 by RT PCR NEGATIVE NEGATIVE   Influenza A by PCR POSITIVE (A) NEGATIVE   Influenza B by PCR NEGATIVE NEGATIVE   Resp Syncytial Virus by PCR NEGATIVE NEGATIVE      Assessment & Plan:     ICD-10-CM   1. 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 Malnutrition, unspecified type (HCC)  E46    improved weight and appetite    3. Chronic obstructive pulmonary disease, unspecified COPD type (HCC)  J44.9    sx stable on current meds    4. Primary hypertension  I10    BP at goal off meds for several months now - continue managing with diet/lifestyle    5. Acute left ankle pain  M25.572    acute injury and abrasion per ortho, in boot, abrasions minor and healing - wound care education/counseling done, no sigs of infection        Return in about 6 months (around 04/08/2024) for Routine follow-up.   Michelene Cower, PA-C 10/07/23 8:35 AM

## 2023-10-20 ENCOUNTER — Other Ambulatory Visit: Payer: Self-pay | Admitting: Family Medicine

## 2023-10-20 DIAGNOSIS — E782 Mixed hyperlipidemia: Secondary | ICD-10-CM

## 2023-10-20 NOTE — Telephone Encounter (Signed)
 Requested Prescriptions  Pending Prescriptions Disp Refills   albuterol  (VENTOLIN  HFA) 108 (90 Base) MCG/ACT inhaler [Pharmacy Med Name: ALBUTEROL  HFA (PROAIR ) INHALER] 8.5 each 2    Sig: INHALE 2 PUFFS BY MOUTH EVERY 4 TO 6 HOURS AS NEEDED     Pulmonology:  [facility] 2 Passed - 10/20/2023  4:45 PM      Passed - Last BP in normal range    BP Readings from Last 1 Encounters:  10/07/23 118/70         Passed - Last Heart Rate in normal range    Pulse Readings from Last 1 Encounters:  10/07/23 71         Passed - Valid encounter within last 12 months    Recent Outpatient Visits           1 week ago 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

## 2023-12-29 ENCOUNTER — Other Ambulatory Visit: Payer: Self-pay | Admitting: Neurology

## 2023-12-29 DIAGNOSIS — M542 Cervicalgia: Secondary | ICD-10-CM

## 2023-12-31 ENCOUNTER — Ambulatory Visit
Admission: RE | Admit: 2023-12-31 | Discharge: 2023-12-31 | Disposition: A | Discharge status: Home / Self Care | Source: Ambulatory Visit | Attending: Neurology | Admitting: Neurology

## 2023-12-31 DIAGNOSIS — M542 Cervicalgia: Secondary | ICD-10-CM | POA: Diagnosis present
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
## 2024-01-12 ENCOUNTER — Telehealth: Payer: Self-pay

## 2024-01-12 NOTE — Telephone Encounter (Signed)
-----   Message from Pomona B sent at 01/12/2024  8:14 AM EST ----- Regarding: appt request Patient is calling to request an appointment for her cervical pain. Can you please review her imaging and advise where to schedule. She has not had any PT or injections.

## 2024-01-13 NOTE — Telephone Encounter (Signed)
 Patient scheduled on 02/01/2024 with Dr. Claudene as patient was adamant in seeing an MD.
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
## 2024-02-01 ENCOUNTER — Ambulatory Visit: Admitting: Neurosurgery

## 2024-02-01 ENCOUNTER — Encounter: Payer: Self-pay | Admitting: Neurosurgery

## 2024-02-01 ENCOUNTER — Ambulatory Visit

## 2024-02-01 VITALS — BP 134/84 | Ht 67.0 in | Wt 131.0 lb

## 2024-02-01 DIAGNOSIS — M47812 Spondylosis without myelopathy or radiculopathy, cervical region: Secondary | ICD-10-CM

## 2024-02-01 DIAGNOSIS — M4802 Spinal stenosis, cervical region: Secondary | ICD-10-CM | POA: Diagnosis not present

## 2024-02-01 DIAGNOSIS — G8929 Other chronic pain: Secondary | ICD-10-CM

## 2024-02-07 ENCOUNTER — Other Ambulatory Visit: Payer: Self-pay | Admitting: Family Medicine

## 2024-02-07 ENCOUNTER — Other Ambulatory Visit: Payer: Self-pay | Admitting: Neurology

## 2024-02-07 DIAGNOSIS — I669 Occlusion and stenosis of unspecified cerebral artery: Secondary | ICD-10-CM

## 2024-02-07 DIAGNOSIS — R937 Abnormal findings on diagnostic imaging of other parts of musculoskeletal system: Secondary | ICD-10-CM

## 2024-02-07 NOTE — Telephone Encounter (Signed)
 Copied from CRM [id number]. Topic: Clinical - Medication Refill >> Feb 07, 2024  1:10 PM Carla L wrote: Medication: prochlorperazine  (COMPAZINE ) 10 MG tablet Patient has scheduled TOC appointment with Mliss Spray, requesting refills until able to be seen.   Has the patient contacted their pharmacy? Yes  This is the patient's preferred pharmacy:  [id number] - [place], [place] - [address] [place] [address] Phone: [phone] Fax: [phone]  Is this the correct pharmacy for this prescription? Yes  Has the prescription been filled recently? No  Is the patient out of the medication? Yes  Has the patient been seen for an appointment in the last year OR does the patient have an upcoming appointment? Yes  Can we respond through MyChart? Yes  Agent: Please be advised that Rx refills may take up to 3 business days. We ask that you follow-up with your pharmacy.

## 2024-02-09 MED ORDER — PROCHLORPERAZINE MALEATE 10 MG PO TABS: 10.0000 mg | ORAL_TABLET | Freq: Two times a day (BID) | ORAL | 1 refills | Status: AC | PRN

## 2024-02-09 NOTE — Telephone Encounter (Signed)
 Requested medication (s) are due for refill today - yes  Requested medication (s) are on the active medication list -yes  Future visit scheduled -yes  Last refill: 07/06/23 #20 1RF  Notes to clinic: non delegated Rx  Requested Prescriptions  Pending Prescriptions Disp Refills   prochlorperazine  (COMPAZINE ) 10 MG tablet 20 tablet 1    Sig: Take 1 tablet (10 mg total) by mouth 2 (two) times daily as needed for nausea or vomiting (Nausea ).     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   [facility] Leavy Mole, PA-C   7 months ago Unintentional weight loss of less than 10% body weight within 6 months   [facility] Leavy Mole, PA-C   8 months ago Other constipation   [facility] Leavy Mole, PA-C   8 months ago Nausea, vomiting and diarrhea   [facility] Leavy Mole, PA-C                 Requested Prescriptions  Pending Prescriptions Disp Refills   prochlorperazine  (COMPAZINE ) 10 MG tablet 20 tablet 1    Sig: Take 1 tablet (10 mg total) by mouth 2 (two) times daily as needed for nausea or vomiting (Nausea ).     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 Leavy Mole, PA-C   7 months ago Unintentional weight loss of less than 10% body weight within 6 months   [facility] Leavy Mole, PA-C   8 months ago Other constipation    [facility] Leavy Mole, PA-C   8 months ago Nausea, vomiting and diarrhea   [facility] Leavy Mole, PA-C

## 2024-02-10 ENCOUNTER — Ambulatory Visit: Payer: Self-pay | Admitting: Neurosurgery

## 2024-02-15 ENCOUNTER — Other Ambulatory Visit: Payer: Self-pay | Admitting: Neurology

## 2024-02-15 ENCOUNTER — Ambulatory Visit: Admission: RE | Admit: 2024-02-15 | Discharge: 2024-02-15 | Attending: Neurology | Admitting: Neurology

## 2024-02-15 DIAGNOSIS — R937 Abnormal findings on diagnostic imaging of other parts of musculoskeletal system: Secondary | ICD-10-CM

## 2024-02-15 DIAGNOSIS — I669 Occlusion and stenosis of unspecified cerebral artery: Secondary | ICD-10-CM

## 2024-02-15 DIAGNOSIS — I671 Cerebral aneurysm, nonruptured: Secondary | ICD-10-CM

## 2024-02-15 DIAGNOSIS — M542 Cervicalgia: Secondary | ICD-10-CM

## 2024-02-19 ENCOUNTER — Ambulatory Visit
Admission: RE | Admit: 2024-02-19 | Discharge: 2024-02-19 | Disposition: A | Discharge status: Home / Self Care | Source: Ambulatory Visit | Attending: Neurology | Admitting: Neurology

## 2024-02-19 DIAGNOSIS — I671 Cerebral aneurysm, nonruptured: Secondary | ICD-10-CM | POA: Insufficient documentation

## 2024-02-19 DIAGNOSIS — I669 Occlusion and stenosis of unspecified cerebral artery: Secondary | ICD-10-CM | POA: Diagnosis present

## 2024-02-19 DIAGNOSIS — R937 Abnormal findings on diagnostic imaging of other parts of musculoskeletal system: Secondary | ICD-10-CM | POA: Diagnosis present

## 2024-02-19 DIAGNOSIS — M542 Cervicalgia: Secondary | ICD-10-CM | POA: Insufficient documentation

## 2024-04-03 ENCOUNTER — Other Ambulatory Visit: Payer: Self-pay | Admitting: Nurse Practitioner

## 2024-04-04 NOTE — Telephone Encounter (Signed)
 Requested medication (s) are due for refill today: yes  Requested medication (s) are on the active medication list: yes  Last refill:  02/09/24  Future visit scheduled: yes  Notes to clinic:  Unable to refill per protocol, cannot delegate.      Requested Prescriptions  Pending Prescriptions Disp Refills   prochlorperazine  (COMPAZINE ) 10 MG tablet [Pharmacy Med Name: PROCHLORPERAZINE  10 MG TAB] 20 tablet 1    Sig: Take 1 tablet (10 mg total) by mouth 2 (two) times daily as needed for nausea or vomiting (Nausea ).     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

## 2024-04-09 ENCOUNTER — Encounter: Admitting: Nurse Practitioner

## 2024-04-09 ENCOUNTER — Ambulatory Visit: Admitting: Family Medicine

## 2024-06-14 ENCOUNTER — Ambulatory Visit
